# Patient Record
Sex: Female | Born: 1975 | Race: White | Hispanic: No | Smoking: Current every day smoker
Health system: Southern US, Community
[De-identification: ages and names within clinical notes are randomized; demographics above are authoritative.]

## PROBLEM LIST (undated history)

## (undated) ENCOUNTER — Emergency Department (HOSPITAL_COMMUNITY): Discharge status: Against Medical Advice | Payer: Medicare Other

## (undated) DIAGNOSIS — Z765 Malingerer [conscious simulation]: Secondary | ICD-10-CM

## (undated) DIAGNOSIS — S92909A Unspecified fracture of unspecified foot, initial encounter for closed fracture: Secondary | ICD-10-CM

## (undated) DIAGNOSIS — D219 Benign neoplasm of connective and other soft tissue, unspecified: Secondary | ICD-10-CM

## (undated) DIAGNOSIS — C801 Malignant (primary) neoplasm, unspecified: Secondary | ICD-10-CM

## (undated) DIAGNOSIS — F1910 Other psychoactive substance abuse, uncomplicated: Secondary | ICD-10-CM

## (undated) DIAGNOSIS — N809 Endometriosis, unspecified: Secondary | ICD-10-CM

## (undated) DIAGNOSIS — G4730 Sleep apnea, unspecified: Secondary | ICD-10-CM

## (undated) DIAGNOSIS — E079 Disorder of thyroid, unspecified: Secondary | ICD-10-CM

## (undated) DIAGNOSIS — R569 Unspecified convulsions: Secondary | ICD-10-CM

## (undated) DIAGNOSIS — R51 Headache: Secondary | ICD-10-CM

## (undated) DIAGNOSIS — F419 Anxiety disorder, unspecified: Secondary | ICD-10-CM

## (undated) DIAGNOSIS — K219 Gastro-esophageal reflux disease without esophagitis: Secondary | ICD-10-CM

## (undated) DIAGNOSIS — M797 Fibromyalgia: Secondary | ICD-10-CM

## (undated) DIAGNOSIS — F1010 Alcohol abuse, uncomplicated: Secondary | ICD-10-CM

## (undated) DIAGNOSIS — E039 Hypothyroidism, unspecified: Secondary | ICD-10-CM

## (undated) DIAGNOSIS — M1990 Unspecified osteoarthritis, unspecified site: Secondary | ICD-10-CM

## (undated) DIAGNOSIS — D759 Disease of blood and blood-forming organs, unspecified: Secondary | ICD-10-CM

## (undated) DIAGNOSIS — T4145XA Adverse effect of unspecified anesthetic, initial encounter: Secondary | ICD-10-CM

## (undated) DIAGNOSIS — T8859XA Other complications of anesthesia, initial encounter: Secondary | ICD-10-CM

## (undated) DIAGNOSIS — D649 Anemia, unspecified: Secondary | ICD-10-CM

## (undated) DIAGNOSIS — R112 Nausea with vomiting, unspecified: Secondary | ICD-10-CM

## (undated) DIAGNOSIS — M5126 Other intervertebral disc displacement, lumbar region: Secondary | ICD-10-CM

## (undated) DIAGNOSIS — M549 Dorsalgia, unspecified: Secondary | ICD-10-CM

## (undated) DIAGNOSIS — Z9889 Other specified postprocedural states: Secondary | ICD-10-CM

## (undated) DIAGNOSIS — G709 Myoneural disorder, unspecified: Secondary | ICD-10-CM

## (undated) DIAGNOSIS — G8929 Other chronic pain: Secondary | ICD-10-CM

## (undated) DIAGNOSIS — E282 Polycystic ovarian syndrome: Secondary | ICD-10-CM

## (undated) HISTORY — PX: TONSILLECTOMY: SUR1361

## (undated) HISTORY — PX: TUBAL LIGATION: SHX77

## (undated) HISTORY — PX: ADENOIDECTOMY: SUR15

## (undated) HISTORY — PX: BACK SURGERY: SHX140

---

## 1998-08-30 ENCOUNTER — Inpatient Hospital Stay (HOSPITAL_COMMUNITY): Admission: AD | Admit: 1998-08-30 | Discharge: 1998-08-31 | Payer: Self-pay | Admitting: Family Medicine

## 1999-07-08 ENCOUNTER — Encounter: Payer: Self-pay | Admitting: Family Medicine

## 1999-07-08 ENCOUNTER — Encounter: Admission: RE | Admit: 1999-07-08 | Discharge: 1999-07-08 | Payer: Self-pay | Admitting: Family Medicine

## 2001-02-02 ENCOUNTER — Emergency Department (HOSPITAL_COMMUNITY): Admission: EM | Admit: 2001-02-02 | Discharge: 2001-02-02 | Payer: Self-pay | Admitting: Emergency Medicine

## 2001-12-05 ENCOUNTER — Encounter: Payer: Self-pay | Admitting: Specialist

## 2001-12-05 ENCOUNTER — Observation Stay (HOSPITAL_COMMUNITY): Admission: RE | Admit: 2001-12-05 | Discharge: 2001-12-06 | Payer: Self-pay | Admitting: Specialist

## 2002-01-19 ENCOUNTER — Encounter: Payer: Self-pay | Admitting: Specialist

## 2002-01-19 ENCOUNTER — Inpatient Hospital Stay (HOSPITAL_COMMUNITY): Admission: RE | Admit: 2002-01-19 | Discharge: 2002-01-21 | Payer: Self-pay | Admitting: Specialist

## 2002-04-10 ENCOUNTER — Encounter: Payer: Self-pay | Admitting: Specialist

## 2002-04-10 ENCOUNTER — Encounter: Admission: RE | Admit: 2002-04-10 | Discharge: 2002-04-10 | Payer: Self-pay | Admitting: Specialist

## 2002-04-25 ENCOUNTER — Encounter: Admission: RE | Admit: 2002-04-25 | Discharge: 2002-04-25 | Payer: Self-pay | Admitting: Specialist

## 2002-04-25 ENCOUNTER — Encounter: Payer: Self-pay | Admitting: Specialist

## 2002-07-20 ENCOUNTER — Emergency Department (HOSPITAL_COMMUNITY): Admission: EM | Admit: 2002-07-20 | Discharge: 2002-07-20 | Payer: Self-pay | Admitting: *Deleted

## 2003-09-24 ENCOUNTER — Ambulatory Visit (HOSPITAL_BASED_OUTPATIENT_CLINIC_OR_DEPARTMENT_OTHER): Admission: RE | Admit: 2003-09-24 | Discharge: 2003-09-24 | Payer: Self-pay | Admitting: Otolaryngology

## 2003-09-24 ENCOUNTER — Encounter (INDEPENDENT_AMBULATORY_CARE_PROVIDER_SITE_OTHER): Payer: Self-pay | Admitting: *Deleted

## 2003-09-24 ENCOUNTER — Ambulatory Visit (HOSPITAL_COMMUNITY): Admission: RE | Admit: 2003-09-24 | Discharge: 2003-09-24 | Payer: Self-pay | Admitting: Otolaryngology

## 2003-10-02 ENCOUNTER — Observation Stay (HOSPITAL_COMMUNITY): Admission: EM | Admit: 2003-10-02 | Discharge: 2003-10-02 | Payer: Self-pay | Admitting: Emergency Medicine

## 2003-10-04 ENCOUNTER — Emergency Department (HOSPITAL_COMMUNITY): Admission: EM | Admit: 2003-10-04 | Discharge: 2003-10-04 | Payer: Self-pay | Admitting: Emergency Medicine

## 2003-10-05 ENCOUNTER — Ambulatory Visit (HOSPITAL_COMMUNITY): Admission: RE | Admit: 2003-10-05 | Discharge: 2003-10-05 | Payer: Self-pay | Admitting: Otolaryngology

## 2004-01-01 ENCOUNTER — Emergency Department (HOSPITAL_COMMUNITY): Admission: EM | Admit: 2004-01-01 | Discharge: 2004-01-01 | Payer: Self-pay | Admitting: Emergency Medicine

## 2004-01-24 ENCOUNTER — Inpatient Hospital Stay (HOSPITAL_COMMUNITY): Admission: RE | Admit: 2004-01-24 | Discharge: 2004-01-25 | Payer: Self-pay | Admitting: Specialist

## 2004-01-27 ENCOUNTER — Emergency Department (HOSPITAL_COMMUNITY): Admission: EM | Admit: 2004-01-27 | Discharge: 2004-01-27 | Payer: Self-pay | Admitting: Emergency Medicine

## 2005-03-17 ENCOUNTER — Other Ambulatory Visit: Admission: RE | Admit: 2005-03-17 | Discharge: 2005-03-17 | Payer: Self-pay | Admitting: Obstetrics and Gynecology

## 2005-03-20 ENCOUNTER — Ambulatory Visit (HOSPITAL_COMMUNITY): Admission: RE | Admit: 2005-03-20 | Discharge: 2005-03-20 | Payer: Self-pay | Admitting: Obstetrics and Gynecology

## 2005-04-25 ENCOUNTER — Inpatient Hospital Stay (HOSPITAL_COMMUNITY): Admission: AD | Admit: 2005-04-25 | Discharge: 2005-04-25 | Payer: Self-pay | Admitting: Gynecology

## 2005-05-27 ENCOUNTER — Inpatient Hospital Stay (HOSPITAL_COMMUNITY): Admission: RE | Admit: 2005-05-27 | Discharge: 2005-05-30 | Payer: Self-pay | Admitting: Orthopaedic Surgery

## 2005-11-24 ENCOUNTER — Ambulatory Visit (HOSPITAL_BASED_OUTPATIENT_CLINIC_OR_DEPARTMENT_OTHER): Admission: RE | Admit: 2005-11-24 | Discharge: 2005-11-24 | Payer: Self-pay | Admitting: Orthopaedic Surgery

## 2005-12-25 ENCOUNTER — Encounter: Admission: RE | Admit: 2005-12-25 | Discharge: 2005-12-25 | Payer: Self-pay | Admitting: Orthopaedic Surgery

## 2005-12-31 ENCOUNTER — Ambulatory Visit (HOSPITAL_COMMUNITY): Admission: RE | Admit: 2005-12-31 | Discharge: 2005-12-31 | Payer: Self-pay | Admitting: Orthopaedic Surgery

## 2006-01-28 ENCOUNTER — Encounter: Admission: RE | Admit: 2006-01-28 | Discharge: 2006-01-28 | Payer: Self-pay | Admitting: Orthopaedic Surgery

## 2006-04-02 ENCOUNTER — Other Ambulatory Visit: Admission: RE | Admit: 2006-04-02 | Discharge: 2006-04-02 | Payer: Self-pay | Admitting: Obstetrics and Gynecology

## 2006-04-15 ENCOUNTER — Encounter: Admission: RE | Admit: 2006-04-15 | Discharge: 2006-04-15 | Payer: Self-pay | Admitting: Internal Medicine

## 2006-04-22 ENCOUNTER — Encounter: Admission: RE | Admit: 2006-04-22 | Discharge: 2006-04-22 | Payer: Self-pay | Admitting: Internal Medicine

## 2006-12-17 ENCOUNTER — Emergency Department (HOSPITAL_COMMUNITY): Admission: EM | Admit: 2006-12-17 | Discharge: 2006-12-17 | Payer: Self-pay | Admitting: *Deleted

## 2007-08-17 ENCOUNTER — Inpatient Hospital Stay (HOSPITAL_COMMUNITY): Admission: AD | Admit: 2007-08-17 | Discharge: 2007-08-17 | Payer: Self-pay | Admitting: Obstetrics & Gynecology

## 2007-09-12 ENCOUNTER — Inpatient Hospital Stay (HOSPITAL_COMMUNITY): Admission: RE | Admit: 2007-09-12 | Discharge: 2007-09-15 | Payer: Self-pay | Admitting: Obstetrics and Gynecology

## 2007-09-24 ENCOUNTER — Inpatient Hospital Stay (HOSPITAL_COMMUNITY): Admission: AD | Admit: 2007-09-24 | Discharge: 2007-09-24 | Payer: Self-pay | Admitting: Obstetrics and Gynecology

## 2008-02-10 ENCOUNTER — Emergency Department (HOSPITAL_COMMUNITY): Admission: EM | Admit: 2008-02-10 | Discharge: 2008-02-10 | Payer: Self-pay | Admitting: Emergency Medicine

## 2008-06-06 ENCOUNTER — Emergency Department (HOSPITAL_COMMUNITY): Admission: EM | Admit: 2008-06-06 | Discharge: 2008-06-06 | Payer: Self-pay | Admitting: Emergency Medicine

## 2008-08-16 ENCOUNTER — Emergency Department (HOSPITAL_COMMUNITY): Admission: EM | Admit: 2008-08-16 | Discharge: 2008-08-17 | Payer: Self-pay | Admitting: Emergency Medicine

## 2008-09-16 ENCOUNTER — Emergency Department (HOSPITAL_COMMUNITY): Admission: EM | Admit: 2008-09-16 | Discharge: 2008-09-16 | Payer: Self-pay | Admitting: Emergency Medicine

## 2008-10-10 ENCOUNTER — Ambulatory Visit (HOSPITAL_COMMUNITY): Admission: RE | Admit: 2008-10-10 | Discharge: 2008-10-10 | Payer: Self-pay | Admitting: Internal Medicine

## 2009-02-05 ENCOUNTER — Emergency Department (HOSPITAL_COMMUNITY): Admission: EM | Admit: 2009-02-05 | Discharge: 2009-02-05 | Payer: Self-pay | Admitting: Emergency Medicine

## 2009-02-07 ENCOUNTER — Emergency Department (HOSPITAL_COMMUNITY): Admission: EM | Admit: 2009-02-07 | Discharge: 2009-02-07 | Payer: Self-pay | Admitting: Emergency Medicine

## 2009-04-05 ENCOUNTER — Emergency Department (HOSPITAL_COMMUNITY): Admission: EM | Admit: 2009-04-05 | Discharge: 2009-04-05 | Payer: Self-pay | Admitting: Emergency Medicine

## 2009-04-11 ENCOUNTER — Emergency Department (HOSPITAL_COMMUNITY): Admission: EM | Admit: 2009-04-11 | Discharge: 2009-04-11 | Payer: Self-pay | Admitting: Emergency Medicine

## 2009-05-27 ENCOUNTER — Ambulatory Visit (HOSPITAL_COMMUNITY): Admission: RE | Admit: 2009-05-27 | Discharge: 2009-05-27 | Payer: Self-pay | Admitting: Obstetrics and Gynecology

## 2009-08-09 ENCOUNTER — Emergency Department (HOSPITAL_COMMUNITY): Admission: EM | Admit: 2009-08-09 | Discharge: 2009-08-09 | Payer: Self-pay | Admitting: Emergency Medicine

## 2009-08-19 ENCOUNTER — Emergency Department (HOSPITAL_COMMUNITY): Admission: EM | Admit: 2009-08-19 | Discharge: 2009-08-19 | Payer: Self-pay | Admitting: Emergency Medicine

## 2009-08-31 ENCOUNTER — Emergency Department (HOSPITAL_COMMUNITY): Admission: EM | Admit: 2009-08-31 | Discharge: 2009-08-31 | Payer: Self-pay | Admitting: Emergency Medicine

## 2009-10-01 ENCOUNTER — Emergency Department (HOSPITAL_COMMUNITY): Admission: EM | Admit: 2009-10-01 | Discharge: 2009-10-01 | Payer: Self-pay | Admitting: Emergency Medicine

## 2009-10-06 ENCOUNTER — Emergency Department (HOSPITAL_COMMUNITY): Admission: EM | Admit: 2009-10-06 | Discharge: 2009-10-06 | Payer: Self-pay | Admitting: Emergency Medicine

## 2009-10-26 ENCOUNTER — Emergency Department (HOSPITAL_COMMUNITY): Admission: EM | Admit: 2009-10-26 | Discharge: 2009-10-26 | Payer: Self-pay | Admitting: Emergency Medicine

## 2009-11-23 ENCOUNTER — Emergency Department (HOSPITAL_COMMUNITY): Admission: EM | Admit: 2009-11-23 | Discharge: 2009-11-23 | Payer: Self-pay | Admitting: Emergency Medicine

## 2009-11-25 ENCOUNTER — Encounter: Admission: RE | Admit: 2009-11-25 | Discharge: 2009-11-25 | Payer: Self-pay | Admitting: Internal Medicine

## 2009-11-28 ENCOUNTER — Emergency Department (HOSPITAL_COMMUNITY): Admission: EM | Admit: 2009-11-28 | Discharge: 2009-11-28 | Payer: Self-pay | Admitting: Emergency Medicine

## 2009-12-26 ENCOUNTER — Emergency Department (HOSPITAL_COMMUNITY): Admission: EM | Admit: 2009-12-26 | Discharge: 2009-12-27 | Payer: Self-pay | Admitting: Emergency Medicine

## 2010-02-19 ENCOUNTER — Ambulatory Visit: Payer: Self-pay | Admitting: Psychiatry

## 2010-02-19 ENCOUNTER — Emergency Department (HOSPITAL_COMMUNITY): Admission: EM | Admit: 2010-02-19 | Discharge: 2010-02-19 | Payer: Self-pay | Admitting: Ophthalmology

## 2010-08-12 ENCOUNTER — Emergency Department (HOSPITAL_COMMUNITY)
Admission: EM | Admit: 2010-08-12 | Discharge: 2010-08-12 | Payer: BC Managed Care – PPO | Source: Home / Self Care | Admitting: Emergency Medicine

## 2010-08-17 ENCOUNTER — Encounter: Payer: Self-pay | Admitting: Obstetrics and Gynecology

## 2010-09-10 ENCOUNTER — Emergency Department (HOSPITAL_COMMUNITY): Payer: BC Managed Care – PPO

## 2010-09-10 ENCOUNTER — Emergency Department (HOSPITAL_COMMUNITY)
Admission: EM | Admit: 2010-09-10 | Discharge: 2010-09-10 | Disposition: A | Discharge status: Home / Self Care | Payer: BC Managed Care – PPO | Attending: Emergency Medicine | Admitting: Emergency Medicine

## 2010-09-10 DIAGNOSIS — G8929 Other chronic pain: Secondary | ICD-10-CM | POA: Insufficient documentation

## 2010-09-10 DIAGNOSIS — Z79899 Other long term (current) drug therapy: Secondary | ICD-10-CM | POA: Insufficient documentation

## 2010-09-10 DIAGNOSIS — M533 Sacrococcygeal disorders, not elsewhere classified: Secondary | ICD-10-CM | POA: Insufficient documentation

## 2010-09-10 DIAGNOSIS — M79609 Pain in unspecified limb: Secondary | ICD-10-CM | POA: Insufficient documentation

## 2010-09-10 DIAGNOSIS — S300XXA Contusion of lower back and pelvis, initial encounter: Secondary | ICD-10-CM | POA: Insufficient documentation

## 2010-09-10 DIAGNOSIS — W230XXA Caught, crushed, jammed, or pinched between moving objects, initial encounter: Secondary | ICD-10-CM | POA: Insufficient documentation

## 2010-09-10 DIAGNOSIS — E039 Hypothyroidism, unspecified: Secondary | ICD-10-CM | POA: Insufficient documentation

## 2010-09-10 DIAGNOSIS — M1990 Unspecified osteoarthritis, unspecified site: Secondary | ICD-10-CM | POA: Insufficient documentation

## 2010-10-11 LAB — RAPID URINE DRUG SCREEN, HOSP PERFORMED
Amphetamines: NOT DETECTED
Barbiturates: NOT DETECTED
Benzodiazepines: POSITIVE — AB
Cocaine: NOT DETECTED
Opiates: NOT DETECTED

## 2010-10-11 LAB — BASIC METABOLIC PANEL
BUN: 16 mg/dL (ref 6–23)
CO2: 24 mEq/L (ref 19–32)
Chloride: 105 mEq/L (ref 96–112)
Creatinine, Ser: 1.36 mg/dL — ABNORMAL HIGH (ref 0.4–1.2)
GFR calc Af Amer: 54 mL/min — ABNORMAL LOW (ref >60)
Potassium: 3.8 mEq/L (ref 3.5–5.1)

## 2010-10-11 LAB — URINALYSIS, ROUTINE W REFLEX MICROSCOPIC
Glucose, UA: NEGATIVE mg/dL
Hgb urine dipstick: NEGATIVE
Ketones, ur: NEGATIVE mg/dL
Protein, ur: NEGATIVE mg/dL

## 2010-10-11 LAB — CBC
HCT: 43.5 % (ref 36.0–46.0)
MCH: 32.7 pg (ref 26.0–34.0)
MCV: 92.4 fL (ref 78.0–100.0)
Platelets: 286 10*3/uL (ref 150–400)
RBC: 4.71 MIL/uL (ref 3.87–5.11)

## 2010-10-11 LAB — DIFFERENTIAL
Eosinophils Absolute: 0.1 10*3/uL (ref 0.0–0.7)
Eosinophils Relative: 1 % (ref 0–5)
Lymphs Abs: 3.2 10*3/uL (ref 0.7–4.0)
Monocytes Absolute: 1 10*3/uL (ref 0.1–1.0)

## 2010-10-11 LAB — URINE MICROSCOPIC-ADD ON

## 2010-10-11 LAB — URINE CULTURE

## 2010-10-13 LAB — POCT I-STAT, CHEM 8
BUN: 9 mg/dL (ref 6–23)
Calcium, Ion: 1.07 mmol/L — ABNORMAL LOW (ref 1.12–1.32)
Chloride: 107 mEq/L (ref 96–112)
Creatinine, Ser: 0.8 mg/dL (ref 0.4–1.2)
Glucose, Bld: 106 mg/dL — ABNORMAL HIGH (ref 70–99)
TCO2: 21 mmol/L (ref 0–100)

## 2010-10-13 LAB — URINE MICROSCOPIC-ADD ON

## 2010-10-13 LAB — URINALYSIS, ROUTINE W REFLEX MICROSCOPIC
Bilirubin Urine: NEGATIVE
Ketones, ur: NEGATIVE mg/dL
Leukocytes, UA: NEGATIVE
Nitrite: NEGATIVE
Protein, ur: NEGATIVE mg/dL
pH: 6 (ref 5.0–8.0)

## 2010-10-13 LAB — BASIC METABOLIC PANEL
BUN: 8 mg/dL (ref 6–23)
CO2: 25 mEq/L (ref 19–32)
Calcium: 9.2 mg/dL (ref 8.4–10.5)
Chloride: 107 mEq/L (ref 96–112)
Creatinine, Ser: 0.92 mg/dL (ref 0.4–1.2)
GFR calc Af Amer: >60 mL/min (ref >60)
Glucose, Bld: 106 mg/dL — ABNORMAL HIGH (ref 70–99)

## 2010-10-13 LAB — GLUCOSE, CAPILLARY: Glucose-Capillary: 127 mg/dL — ABNORMAL HIGH (ref 70–99)

## 2010-10-13 LAB — PREGNANCY, URINE: Preg Test, Ur: NEGATIVE

## 2010-10-13 LAB — CBC
MCHC: 34.6 g/dL (ref 30.0–36.0)
MCV: 93.2 fL (ref 78.0–100.0)
RDW: 14.1 % (ref 11.5–15.5)

## 2010-10-14 ENCOUNTER — Emergency Department (HOSPITAL_COMMUNITY): Payer: BC Managed Care – PPO

## 2010-10-14 ENCOUNTER — Emergency Department (HOSPITAL_COMMUNITY)
Admission: EM | Admit: 2010-10-14 | Discharge: 2010-10-14 | Disposition: A | Discharge status: Home / Self Care | Payer: BC Managed Care – PPO | Attending: Emergency Medicine | Admitting: Emergency Medicine

## 2010-10-14 DIAGNOSIS — IMO0001 Reserved for inherently not codable concepts without codable children: Secondary | ICD-10-CM | POA: Insufficient documentation

## 2010-10-14 DIAGNOSIS — M79609 Pain in unspecified limb: Secondary | ICD-10-CM | POA: Insufficient documentation

## 2010-10-14 DIAGNOSIS — M545 Low back pain, unspecified: Secondary | ICD-10-CM | POA: Insufficient documentation

## 2010-10-14 DIAGNOSIS — Z79899 Other long term (current) drug therapy: Secondary | ICD-10-CM | POA: Insufficient documentation

## 2010-10-14 DIAGNOSIS — M1990 Unspecified osteoarthritis, unspecified site: Secondary | ICD-10-CM | POA: Insufficient documentation

## 2010-10-14 DIAGNOSIS — G8929 Other chronic pain: Secondary | ICD-10-CM | POA: Insufficient documentation

## 2010-10-14 LAB — POCT I-STAT, CHEM 8
BUN: 7 mg/dL (ref 6–23)
Calcium, Ion: 1.14 mmol/L (ref 1.12–1.32)
Chloride: 109 mEq/L (ref 96–112)
Glucose, Bld: 86 mg/dL (ref 70–99)
HCT: 45 % (ref 36.0–46.0)
Potassium: 4 mEq/L (ref 3.5–5.1)

## 2010-10-14 LAB — GLUCOSE, CAPILLARY: Glucose-Capillary: 78 mg/dL (ref 70–99)

## 2010-10-15 LAB — URINE MICROSCOPIC-ADD ON

## 2010-10-15 LAB — COMPREHENSIVE METABOLIC PANEL
ALT: 40 U/L — ABNORMAL HIGH (ref 0–35)
Alkaline Phosphatase: 51 U/L (ref 39–117)
BUN: 8 mg/dL (ref 6–23)
CO2: 22 mEq/L (ref 19–32)
GFR calc non Af Amer: >60 mL/min (ref >60)
Glucose, Bld: 129 mg/dL — ABNORMAL HIGH (ref 70–99)
Potassium: 3.9 mEq/L (ref 3.5–5.1)
Sodium: 138 mEq/L (ref 135–145)
Total Bilirubin: 0.3 mg/dL (ref 0.3–1.2)

## 2010-10-15 LAB — URINALYSIS, ROUTINE W REFLEX MICROSCOPIC
Specific Gravity, Urine: 1.029 (ref 1.005–1.030)
Urobilinogen, UA: 0.2 mg/dL (ref 0.0–1.0)

## 2010-10-15 LAB — DIFFERENTIAL
Basophils Absolute: 0 10*3/uL (ref 0.0–0.1)
Eosinophils Relative: 1 % (ref 0–5)
Lymphocytes Relative: 4 % — ABNORMAL LOW (ref 12–46)
Neutro Abs: 7.9 10*3/uL — ABNORMAL HIGH (ref 1.7–7.7)
Neutrophils Relative %: 93 % — ABNORMAL HIGH (ref 43–77)

## 2010-10-15 LAB — CBC
HCT: 44.8 % (ref 36.0–46.0)
Hemoglobin: 15.3 g/dL — ABNORMAL HIGH (ref 12.0–15.0)
RBC: 4.8 MIL/uL (ref 3.87–5.11)
RDW: 13.2 % (ref 11.5–15.5)

## 2010-10-15 LAB — POCT CARDIAC MARKERS: CKMB, poc: <1 ng/mL — ABNORMAL LOW (ref 1.0–8.0)

## 2010-10-15 LAB — LIPASE, BLOOD: Lipase: 19 U/L (ref 11–59)

## 2010-10-16 ENCOUNTER — Inpatient Hospital Stay (HOSPITAL_COMMUNITY): Admission: RE | Admit: 2010-10-16 | Payer: BC Managed Care – PPO | Source: Ambulatory Visit

## 2010-10-16 ENCOUNTER — Emergency Department (HOSPITAL_COMMUNITY)
Admission: EM | Admit: 2010-10-16 | Discharge: 2010-10-17 | Disposition: A | Discharge status: Home / Self Care | Payer: BC Managed Care – PPO | Attending: Emergency Medicine | Admitting: Emergency Medicine

## 2010-10-16 DIAGNOSIS — M5137 Other intervertebral disc degeneration, lumbosacral region: Secondary | ICD-10-CM | POA: Insufficient documentation

## 2010-10-16 DIAGNOSIS — Z79899 Other long term (current) drug therapy: Secondary | ICD-10-CM | POA: Insufficient documentation

## 2010-10-16 DIAGNOSIS — E039 Hypothyroidism, unspecified: Secondary | ICD-10-CM | POA: Insufficient documentation

## 2010-10-16 DIAGNOSIS — M51379 Other intervertebral disc degeneration, lumbosacral region without mention of lumbar back pain or lower extremity pain: Secondary | ICD-10-CM | POA: Insufficient documentation

## 2010-10-16 DIAGNOSIS — M545 Low back pain, unspecified: Secondary | ICD-10-CM | POA: Insufficient documentation

## 2010-10-16 DIAGNOSIS — R000 Tachycardia, unspecified: Secondary | ICD-10-CM | POA: Insufficient documentation

## 2010-10-16 DIAGNOSIS — M1990 Unspecified osteoarthritis, unspecified site: Secondary | ICD-10-CM | POA: Insufficient documentation

## 2010-10-16 DIAGNOSIS — G8929 Other chronic pain: Secondary | ICD-10-CM | POA: Insufficient documentation

## 2010-10-16 DIAGNOSIS — R209 Unspecified disturbances of skin sensation: Secondary | ICD-10-CM | POA: Insufficient documentation

## 2010-10-17 LAB — POCT PREGNANCY, URINE: Preg Test, Ur: NEGATIVE

## 2010-10-21 ENCOUNTER — Emergency Department (HOSPITAL_COMMUNITY)
Admission: EM | Admit: 2010-10-21 | Discharge: 2010-10-21 | Disposition: A | Discharge status: Home / Self Care | Payer: BC Managed Care – PPO | Attending: Emergency Medicine | Admitting: Emergency Medicine

## 2010-10-21 DIAGNOSIS — R209 Unspecified disturbances of skin sensation: Secondary | ICD-10-CM | POA: Insufficient documentation

## 2010-10-21 DIAGNOSIS — G8929 Other chronic pain: Secondary | ICD-10-CM | POA: Insufficient documentation

## 2010-10-21 DIAGNOSIS — M79609 Pain in unspecified limb: Secondary | ICD-10-CM | POA: Insufficient documentation

## 2010-10-21 DIAGNOSIS — M545 Low back pain, unspecified: Secondary | ICD-10-CM | POA: Insufficient documentation

## 2010-10-21 DIAGNOSIS — X503XXA Overexertion from repetitive movements, initial encounter: Secondary | ICD-10-CM | POA: Insufficient documentation

## 2010-10-21 DIAGNOSIS — E039 Hypothyroidism, unspecified: Secondary | ICD-10-CM | POA: Insufficient documentation

## 2010-10-21 DIAGNOSIS — IMO0001 Reserved for inherently not codable concepts without codable children: Secondary | ICD-10-CM | POA: Insufficient documentation

## 2010-10-21 DIAGNOSIS — IMO0002 Reserved for concepts with insufficient information to code with codable children: Secondary | ICD-10-CM | POA: Insufficient documentation

## 2010-10-21 DIAGNOSIS — M1990 Unspecified osteoarthritis, unspecified site: Secondary | ICD-10-CM | POA: Insufficient documentation

## 2010-10-21 DIAGNOSIS — Y92009 Unspecified place in unspecified non-institutional (private) residence as the place of occurrence of the external cause: Secondary | ICD-10-CM | POA: Insufficient documentation

## 2010-10-21 DIAGNOSIS — Z79899 Other long term (current) drug therapy: Secondary | ICD-10-CM | POA: Insufficient documentation

## 2010-10-29 LAB — CBC
HCT: 41.5 % (ref 36.0–46.0)
Hemoglobin: 14.2 g/dL (ref 12.0–15.0)
MCV: 96 fL (ref 78.0–100.0)
WBC: 8.4 10*3/uL (ref 4.0–10.5)

## 2010-10-30 ENCOUNTER — Other Ambulatory Visit: Payer: Self-pay | Admitting: Orthopaedic Surgery

## 2010-10-30 DIAGNOSIS — IMO0002 Reserved for concepts with insufficient information to code with codable children: Secondary | ICD-10-CM

## 2010-10-31 LAB — BASIC METABOLIC PANEL
BUN: 8 mg/dL (ref 6–23)
CO2: 27 mEq/L (ref 19–32)
Chloride: 106 mEq/L (ref 96–112)
Glucose, Bld: 93 mg/dL (ref 70–99)
Potassium: 4.1 mEq/L (ref 3.5–5.1)
Sodium: 139 mEq/L (ref 135–145)

## 2010-11-03 ENCOUNTER — Other Ambulatory Visit: Payer: BC Managed Care – PPO

## 2010-11-07 ENCOUNTER — Ambulatory Visit
Admission: RE | Admit: 2010-11-07 | Discharge: 2010-11-07 | Disposition: A | Discharge status: Home / Self Care | Payer: BC Managed Care – PPO | Source: Ambulatory Visit | Attending: Orthopaedic Surgery | Admitting: Orthopaedic Surgery

## 2010-11-07 DIAGNOSIS — IMO0002 Reserved for concepts with insufficient information to code with codable children: Secondary | ICD-10-CM

## 2010-11-11 LAB — POCT URINALYSIS DIP (DEVICE)
Hgb urine dipstick: NEGATIVE
Ketones, ur: NEGATIVE mg/dL
Protein, ur: 30 mg/dL — AB
Specific Gravity, Urine: 1.02 (ref 1.005–1.030)
Urobilinogen, UA: 0.2 mg/dL (ref 0.0–1.0)
pH: 7 (ref 5.0–8.0)

## 2010-11-21 ENCOUNTER — Emergency Department (HOSPITAL_COMMUNITY)
Admission: EM | Admit: 2010-11-21 | Discharge: 2010-11-21 | Disposition: A | Discharge status: Home / Self Care | Payer: BC Managed Care – PPO | Attending: Emergency Medicine | Admitting: Emergency Medicine

## 2010-11-21 ENCOUNTER — Emergency Department (HOSPITAL_COMMUNITY): Payer: BC Managed Care – PPO

## 2010-11-21 DIAGNOSIS — IMO0001 Reserved for inherently not codable concepts without codable children: Secondary | ICD-10-CM | POA: Insufficient documentation

## 2010-11-21 DIAGNOSIS — W19XXXA Unspecified fall, initial encounter: Secondary | ICD-10-CM | POA: Insufficient documentation

## 2010-11-21 DIAGNOSIS — M545 Low back pain, unspecified: Secondary | ICD-10-CM | POA: Insufficient documentation

## 2010-11-21 DIAGNOSIS — M79609 Pain in unspecified limb: Secondary | ICD-10-CM | POA: Insufficient documentation

## 2010-11-21 DIAGNOSIS — Z79899 Other long term (current) drug therapy: Secondary | ICD-10-CM | POA: Insufficient documentation

## 2010-11-21 DIAGNOSIS — G8929 Other chronic pain: Secondary | ICD-10-CM | POA: Insufficient documentation

## 2010-11-21 DIAGNOSIS — E039 Hypothyroidism, unspecified: Secondary | ICD-10-CM | POA: Insufficient documentation

## 2010-11-21 DIAGNOSIS — M1990 Unspecified osteoarthritis, unspecified site: Secondary | ICD-10-CM | POA: Insufficient documentation

## 2010-12-01 ENCOUNTER — Emergency Department (HOSPITAL_COMMUNITY)
Admission: EM | Admit: 2010-12-01 | Discharge: 2010-12-02 | Discharge status: Against Medical Advice | Payer: BC Managed Care – PPO | Attending: Emergency Medicine | Admitting: Emergency Medicine

## 2010-12-01 ENCOUNTER — Emergency Department (HOSPITAL_COMMUNITY): Payer: BC Managed Care – PPO

## 2010-12-01 DIAGNOSIS — X58XXXA Exposure to other specified factors, initial encounter: Secondary | ICD-10-CM | POA: Insufficient documentation

## 2010-12-01 DIAGNOSIS — R509 Fever, unspecified: Secondary | ICD-10-CM | POA: Insufficient documentation

## 2010-12-01 DIAGNOSIS — R300 Dysuria: Secondary | ICD-10-CM | POA: Insufficient documentation

## 2010-12-01 DIAGNOSIS — R110 Nausea: Secondary | ICD-10-CM | POA: Insufficient documentation

## 2010-12-01 DIAGNOSIS — S301XXA Contusion of abdominal wall, initial encounter: Secondary | ICD-10-CM | POA: Insufficient documentation

## 2010-12-01 DIAGNOSIS — M549 Dorsalgia, unspecified: Secondary | ICD-10-CM | POA: Insufficient documentation

## 2010-12-01 DIAGNOSIS — R109 Unspecified abdominal pain: Secondary | ICD-10-CM | POA: Insufficient documentation

## 2010-12-01 LAB — CBC
MCH: 30.4 pg (ref 26.0–34.0)
MCHC: 32.5 g/dL (ref 30.0–36.0)
MCV: 93.6 fL (ref 78.0–100.0)
Platelets: 205 10*3/uL (ref 150–400)
RBC: 4.21 MIL/uL (ref 3.87–5.11)

## 2010-12-01 LAB — DIFFERENTIAL
Eosinophils Absolute: 0.2 10*3/uL (ref 0.0–0.7)
Eosinophils Relative: 3 % (ref 0–5)
Lymphs Abs: 2.1 10*3/uL (ref 0.7–4.0)
Monocytes Absolute: 0.7 10*3/uL (ref 0.1–1.0)
Monocytes Relative: 8 % (ref 3–12)
Neutrophils Relative %: 63 % (ref 43–77)

## 2010-12-01 LAB — URINALYSIS, ROUTINE W REFLEX MICROSCOPIC
Bilirubin Urine: NEGATIVE
Hgb urine dipstick: NEGATIVE
Protein, ur: 30 mg/dL — AB
Urobilinogen, UA: 0.2 mg/dL (ref 0.0–1.0)

## 2010-12-01 LAB — COMPREHENSIVE METABOLIC PANEL
Albumin: 3.9 g/dL (ref 3.5–5.2)
BUN: 10 mg/dL (ref 6–23)
Calcium: 9.8 mg/dL (ref 8.4–10.5)
Chloride: 100 mEq/L (ref 96–112)
Creatinine, Ser: 1.01 mg/dL (ref 0.4–1.2)
GFR calc non Af Amer: >60 mL/min (ref >60)
Total Bilirubin: 0.1 mg/dL — ABNORMAL LOW (ref 0.3–1.2)

## 2010-12-01 LAB — APTT: aPTT: 30 seconds (ref 24–37)

## 2010-12-01 LAB — PROTIME-INR: INR: 0.94 (ref 0.00–1.49)

## 2010-12-01 LAB — URINE MICROSCOPIC-ADD ON

## 2010-12-05 ENCOUNTER — Emergency Department (HOSPITAL_COMMUNITY)
Admission: EM | Admit: 2010-12-05 | Discharge: 2010-12-05 | Disposition: A | Discharge status: Home / Self Care | Payer: BC Managed Care – PPO | Attending: Emergency Medicine | Admitting: Emergency Medicine

## 2010-12-05 ENCOUNTER — Emergency Department (HOSPITAL_COMMUNITY): Payer: BC Managed Care – PPO

## 2010-12-05 DIAGNOSIS — E039 Hypothyroidism, unspecified: Secondary | ICD-10-CM | POA: Insufficient documentation

## 2010-12-05 DIAGNOSIS — R0989 Other specified symptoms and signs involving the circulatory and respiratory systems: Secondary | ICD-10-CM | POA: Insufficient documentation

## 2010-12-05 DIAGNOSIS — R000 Tachycardia, unspecified: Secondary | ICD-10-CM | POA: Insufficient documentation

## 2010-12-05 DIAGNOSIS — J069 Acute upper respiratory infection, unspecified: Secondary | ICD-10-CM | POA: Insufficient documentation

## 2010-12-05 DIAGNOSIS — R05 Cough: Secondary | ICD-10-CM | POA: Insufficient documentation

## 2010-12-05 DIAGNOSIS — R0609 Other forms of dyspnea: Secondary | ICD-10-CM | POA: Insufficient documentation

## 2010-12-05 DIAGNOSIS — R059 Cough, unspecified: Secondary | ICD-10-CM | POA: Insufficient documentation

## 2010-12-05 DIAGNOSIS — G8929 Other chronic pain: Secondary | ICD-10-CM | POA: Insufficient documentation

## 2010-12-05 DIAGNOSIS — R51 Headache: Secondary | ICD-10-CM | POA: Insufficient documentation

## 2010-12-05 DIAGNOSIS — J40 Bronchitis, not specified as acute or chronic: Secondary | ICD-10-CM | POA: Insufficient documentation

## 2010-12-05 DIAGNOSIS — R6883 Chills (without fever): Secondary | ICD-10-CM | POA: Insufficient documentation

## 2010-12-05 DIAGNOSIS — Z79899 Other long term (current) drug therapy: Secondary | ICD-10-CM | POA: Insufficient documentation

## 2010-12-05 DIAGNOSIS — R0789 Other chest pain: Secondary | ICD-10-CM | POA: Insufficient documentation

## 2010-12-05 DIAGNOSIS — M1990 Unspecified osteoarthritis, unspecified site: Secondary | ICD-10-CM | POA: Insufficient documentation

## 2010-12-05 DIAGNOSIS — R070 Pain in throat: Secondary | ICD-10-CM | POA: Insufficient documentation

## 2010-12-05 LAB — POCT I-STAT, CHEM 8
BUN: 10 mg/dL (ref 6–23)
Calcium, Ion: 1.13 mmol/L (ref 1.12–1.32)
Creatinine, Ser: 1 mg/dL (ref 0.4–1.2)
Hemoglobin: 12.2 g/dL (ref 12.0–15.0)
Sodium: 136 mEq/L (ref 135–145)
TCO2: 27 mmol/L (ref 0–100)

## 2010-12-05 LAB — URINALYSIS, ROUTINE W REFLEX MICROSCOPIC
Glucose, UA: NEGATIVE mg/dL
Hgb urine dipstick: NEGATIVE
Protein, ur: NEGATIVE mg/dL
Specific Gravity, Urine: 1.031 — ABNORMAL HIGH (ref 1.005–1.030)
pH: 5.5 (ref 5.0–8.0)

## 2010-12-05 LAB — POCT CARDIAC MARKERS: Myoglobin, poc: 35.8 ng/mL (ref 12–200)

## 2010-12-09 NOTE — Op Note (Signed)
[name], [name]                    ACCOUNT NO.:  [id number]   MEDICAL RECORD NO.:  [id number]          PATIENT TYPE:  INP   LOCATION:                                FACILITY:  [facility]   PHYSICIAN:  Lenoard Aden, M.D.DATE OF BIRTH:  1976/04/09   DATE OF PROCEDURE:  DATE OF DISCHARGE:  09/15/2007                               OPERATIVE REPORT   PREOPERATIVE DIAGNOSES:  1. Class A2 diabetes mellitus with mature amnio and worsening glucose      control.  2. 38-week intrauterine pregnancy.  3. Neurologic complications and spinal disk issues requiring per      neurology a need for primary C-section.   POSTOPERATIVE DIAGNOSES:  1. Class A2 diabetes mellitus with mature amnio and worsening glucose      control.  2. 38-week intrauterine pregnancy.  3. Neurologic complications and spinal disk issues requiring per      neurology a need for primary C-section.   PROCEDURE:  Primary low segment transverse cesarean section.   SURGEON:  Lenoard Aden, M.D.   ASSISTANT:  Arvid Right, C.N.M.   ANESTHESIA:  Spinal by Angelica Pou, MD   ESTIMATED BLOOD LOSS:  1000 cc.   COMPLICATIONS:  None.   DRAINS:  None.   DRAINS:  Foley   COUNTS:  Correct.   CONDITION:  Patient to recovery in good condition.   FINDINGS:  Full-term living female, occiput transverse position.  Apgars  8 and 9.  Pediatrician is in attendance.  Placenta from a posterior  location intact.  Three-vessel cord noted, normal tubes, normal ovaries.  Two-layer uterine closure.   BRIEF OPERATIVE NOTE:  Being appraised of the risks of anesthesia,  infection, bleeding, injury to abdominal organs, need for repair  __________ , complications to include bowel and bladder injury, the  patient was brought to the operating room where she was administered  spinal anesthetic without complications, prepped and draped in the usual  sterile fashion.  Foley catheter was placed.  After achieving adequate  anesthesia, Marcaine  solution placed, a Pfannenstiel skin incision was  made with a scalpel and carried down to fascia which was nicked in the  midline and __________ transverse using Mayo scissors.  Rectus muscles  were dissected sharply in the midline.  Peritoneum entered sharply,  bladder blade place.  Visceral peritoneum scored sharply off the lower  uterine segment.  Curved hysterotomy incision made.  Amniotomy, clear  fluid, atraumatic, delivery of full-term living female, handed to  pediatrician in attendance.  Apgars as noted.  Cord blood collected.  Placenta delivered manually  intact.  Three-vessel cord and uterus were  curetted using a dry lap pack and closed in 2 running imbricating layers  of 0 Monocryl suture.  The bladder flap was felt to be hemostatic.  Irrigation accomplished.  Fascia then closed using a 0 Monocryl stitch  in running fashion and subcutaneous tissue reapproximated using a 2-0  plain in a continuous running fashion.  Skin closed using staples.  The  patient tolerated the procedure well and was transferred to recovery in  good condition.      Lenoard Aden, M.D.  Electronically Signed     RJT/MEDQ  D:  09/12/2007  T:  09/13/2007  Job:  [id number]

## 2010-12-09 NOTE — Discharge Summary (Signed)
[name], [name]                    ACCOUNT NO.:  [id number]   MEDICAL RECORD NO.:  [id number]          PATIENT TYPE:  INP   LOCATION:                                FACILITY:  [facility]   PHYSICIAN:  Lenoard Aden, M.D.DATE OF BIRTH:  09/24/2009   DATE OF ADMISSION:  09/12/2007  DATE OF DISCHARGE:                               DISCHARGE SUMMARY   CHIEF COMPLAINT:  Incisional pain and discomfort.   HISTORY OF PRESENT ILLNESS:  The patient is a 35 year old white female  status post uncomplicated primary C-section who presents for followup of  incisional pain and discomfort, currently on Keflex, Percocet, Xanax,  and Paxil.  She has a history of Class A2 diabetes mellitus during  pregnancy which was well controlled.   ALLERGIES:  PENICILLIN, AVELOX, LEVAQUIN, and PHENERGAN.   SOCIAL HISTORY:  Nonsmoker, nondrinker.  Denies domestic physical  violence.   FAMILY HISTORY:  Hypertension, heart disease, diabetes, kidney disease,  depression, leukemia.   PERSONAL HISTORY:  1. C-section as noted.  2. Degenerative disk disease.  3. Tonsillectomy.  4. Gastroesophageal reflux disease.  5. Hypothyroidism.  6. HSV.  7. Uterine fibroids.   PHYSICAL EXAMINATION:  GENERAL:  She is an obese white female in no  acute distress.  HEENT:  Normal.  LUNGS:  Clear.  HEART:  Regular rate and rhythm.  ABDOMEN:  Soft, nontender.  Incision is well healed and intact.  Small  superficial erythema noted.  No abscess palpable.  EXTREMITIES:  No cords.  NEUROLOGIC:  Nonfocal.  SKIN:  Intact.  PELVIC:  Exam is deferred.   White blood cell count is just about 7000 and within normal limits.   IMPRESSION:  No evidence of incisional cellulitis.   PLAN:  Finish Keflex and local care discussed.  Follow up in the office  as scheduled.      Lenoard Aden, M.D.  Electronically Signed     RJT/MEDQ  D:  09/24/2007  T:  09/25/2007  Job:  [id number]

## 2010-12-12 NOTE — Op Note (Signed)
[facility]  Patient:    [name] Visit Number: [id number] MRN: [id number]          Service Type: OBV Location: 4W [id number] [id number] Attending Physician:  Pierce Crane Dictated by:   Javier Docker, M.D. Proc. Date: 01/19/02 Admit Date:  01/19/2002 Discharge Date: 01/21/2002                             Operative Report  PREOPERATIVE DIAGNOSIS:  Recurrent disk herniation L5-S1, left.  POSTOPERATIVE DIAGNOSIS:  Recurrent disk herniation L5-S1, left.  PROCEDURE PERFORMED:  Redo microdiskectomy L5-S1, left.  ANESTHESIA:  General.  SURGEON:  Javier Docker, M.D.  ASSISTANT:  Georges Lynch. Gioffre, M.D.  BRIEF HISTORY AND INDICATION:  This is a 35 year old, who is 6-7 weeks status post a diskectomy.  The patient over the past week had developed severe recurrent left lower extremity radicular pain with MRI indicating recurrent disk herniation at L5-S1.  Operative intervention was indicated for a redo diskectomy to decompress the S1 nerve root.  She had positive neural tension sign and a large compressive mass.  Diminished sensation in the S1 dermatome, diminished Achilles reflex.  The risks and benefits discussed including bleeding, infection, damage to vascular structures, CSF leakage, epidural fibrosis, recurrent disk herniation, need for fusion in the future, etc.  TECHNIQUE:  Patient in supine position.  After an adequate level of general anesthesia and vancomycin, was placed prone on the [place] frame.  All bony prominences were well-padded.  The lumbar region was prepped and draped in the usual sterile fashion.  The previous surgical incision was utilized, and surgical scar excised.  It was extended cephalad and caudad.  Subcutaneous tissue was dissected.  Electrocautery was utilized to achieve hemostasis.  The fascia lata was identified and divided in line with the skin incision. Paraspinous muscles elevated from the lamina of L5 and S1.   Confirmatory radiograph had been obtained.  Epidural fibrosis was noted, and a large curette was utilized to skeletonize the L5 lamina and the S1 lamina.  A confirmatory radiograph was obtained with a Penfield four in the inferior aspect of the interlaminar space of L5-S1.  Next, meticulously with the operative scope draped and brought into the surgical field, we enlarged the foraminotomy of S1, identified the S1 nerve root, gently mobilized it medially.  We detached the epidural fibrosis from the lateral recess decompression utilizing the straight curette.  This was detached, and the hemilaminotomy of L5 was enlarged as well utilizing the 2 mm and 3 mm Kerrison, again detached the adhesions.  This was then further mobilized with a Penfield four.  Next, foraminotomy of L5 was performed with the 2 mm Kerrison.  The pedicle was identified medially.  Just off the pedicle and cephalad, was extensive epidural fibrosis and epidural venous plexus, and electrocautery was utilized to achieve hemostasis.  There was a large recurrent disk herniation.  I had lifted up the posterior longitudinal ligament beneath the S1 nerve root.  This was retrieved with the nerve hook, and a pituitary large fragment was retrieved.  I then entered the disk space with an Epstein curette followed by pituitary and performed a thorough diskectomy.  No residual disk material was noted there that was available for retrieval nor in the axilla of the nerve root or into the foramen of L5 or S1.  The nerve root was freely mobile with 1 cm of excursion medial to the pedicle  following the decompression. Hockey-stick probe placed in the foramen of L5 and S1 and found to be widely patent.  There was no evidence of CSF leakage or active bleeding.  The disk space was copiously irrigated with antibiotic irrigation.  Thrombin-soaked Gelfoam was placed in the laminotomy defect.  McCullough retractor was removed.  Paraspinous muscles  were inspected with no evidence of active bleeding.  The dorsolumbar fascia reapproximated with #1 Vicryl interrupted figure-of-eight suture.  Subcutaneous tissue reapproximated with 2-0 Vicryl simple sutures.  Skin was reapproximated with 3-0 subcuticular Prolene.  The wound was reinforced with Steri-Strips.  Sterile dressing applied.  The patient was placed supine on the hospital bed and extubated without difficulty and transported to the recovery room in satisfactory condition.  The patient tolerated the procedure well with no complications. Dictated by:   Javier Docker, M.D. Attending Physician:  Pierce Crane DD:  01/19/02 TD:  01/21/02 Job: [id number]

## 2010-12-12 NOTE — Op Note (Signed)
[name], [name]                    ACCOUNT NO.:  [id number]   MEDICAL RECORD NO.:  [id number]          PATIENT TYPE:  AMB   LOCATION:  SDS                          FACILITY:  [facility]   PHYSICIAN:  Sharolyn Douglas, M.D.        DATE OF BIRTH:  1976/02/02   DATE OF PROCEDURE:  12/31/2005  DATE OF DISCHARGE:  12/31/2005                                 OPERATIVE REPORT   DIAGNOSES:  1.  Left S1 radiculopathy.  2.  Post-laminectomy syndrome.   PROCEDURE:  1.  Left S1 transforaminal epidural steroid injection.  2.  Fluoroscopic imaging used for epidural injection.   SURGEON:  Sharolyn Douglas, M.D.   ASSISTANT:  None.   ANESTHESIA:  MAC plus local.   ESTIMATED BLOOD LOSS:  None.   COMPLICATIONS:  None.   INDICATIONS:  The patient is a pleasant 35 year old female with chronic  persistent pain in her left leg consistent with an S1 radiculopathy.  She is  status post previous L5-S1 fusion.  Her CT scan shows scar tissue around the  S1 nerve root.  She has elected to undergo a left S1 transforaminal epidural  injection in hopes of quieting down some of her inflammation and pain.  Risks and benefits were reviewed.   DESCRIPTION OF PROCEDURE:  She was identified in the holding area.  The left  side of her back was marked.  She was taken to the operating room.  She  underwent sedation by Anesthesia and she was turned prone.  Back was prepped  and draped in the usual sterile fashion.  Fluoroscopy was used with oblique  imaging to visualize the left S1 foramen.  The overlying skin was  anesthetized with 1% lidocaine.  A 22-gauze Quincke-type spinal needle was  then advanced towards the S1 foramen.  Aspiration was performed and there  was no CSF or blood.  One-half milliliter of 300 Omnipaque was injected and  there was good epidural spread.  We then injected a solution of 80 mg of  Depo-  Medrol and 5 mL of preservative-free lidocaine.  The spinal needle was  removed, Band-aid placed.  The patient was  turned supine and transferred to  Recovery in stable condition.  Neurologically, she was intact.   Post-injections instructions were reviewed.  She will follow up in 2 weeks.      Sharolyn Douglas, M.D.  Electronically Signed     MC/MEDQ  D:  12/31/2005  T:  01/01/2006  Job:  [id number]

## 2010-12-12 NOTE — Op Note (Signed)
[name], [name]                    ACCOUNT NO.:  [id number]   MEDICAL RECORD NO.:  [id number]          PATIENT TYPE:  AMB   LOCATION:  [facility]                         FACILITY:  [facility]   PHYSICIAN:  Sharolyn Douglas, M.D.        DATE OF BIRTH:  August 29, 1975   DATE OF PROCEDURE:  11/24/2005  DATE OF DISCHARGE:                                 OPERATIVE REPORT   DIAGNOSES:  1.  Left S1 radiculopathy.  2.  Lumbar degenerative disk disease, history of previous L5-S1 fusion.   PROCEDURE:  1.  Left S1 transforaminal epidural steroid injection.  2.  Fluoroscopic imaging views for S2 transforaminal injection.   SURGEON:  Sharolyn Douglas, M.D.   ASSISTANT:  None.   ANESTHESIA:  MAC plus local.   ESTIMATED BLOOD LOSS:  None.   COMPLICATIONS:  None.   INDICATION:  The patient is a pleasant 35 year old female who has a history  of __________  fusion at L5-S1.  She did well until recently, when she  developed a left S1 radiculopathy.  She presents now for left S1  transforaminal epidural steroid injection for further diagnostic and  therapeutic purposes.  Risks and benefits were reviewed.   DESCRIPTION OF PROCEDURE:  The patient was identified in the holding area  and taken to the operating room.  She underwent sedation.  She was turned  prone onto the operating table, all bony prominences were padded, her face  and eyes were protected.  The back was prepped and draped in the usual  sterile fashion.  Fluoroscopy was brought into the field and imaging was  obtained to reveal the left S1 foramen.  The skin over the foramen was  anesthetized with 3 mL of 1% lidocaine.  A 20-gauge 3-1/2-inch Tuohy needle  was then passed into the S1 neural foramen using fluoroscopic imaging.  Aspiration was performed and there was no blood or CSF.  One milliliter of  Omnipaque was injected and there was spread of the contrast material noted  within the sacral canal.  We then injected a solution of 80 mg of Depo-  Medrol and  5 mL of 1% preservative-free lidocaine.  The Tuohy needle was  removed and a Band-Aid placed.  The patient was turned supine and  transferred to Recovery in stable condition.   She was neurologically intact in the recovery room.  I reviewed her  instructions with her.  If she any increasing fevers or pain, she will call  the office.  She was given a prescription for Percocet, 20 tablets, to use  tonight.  I will see her in the office in 2 weeks.      Sharolyn Douglas, M.D.  Electronically Signed     MC/MEDQ  D:  11/24/2005  T:  11/25/2005  Job:  [id number]

## 2010-12-12 NOTE — Op Note (Signed)
NAMESHARIN, [name]                       ACCOUNT NO.:  [id number]   MEDICAL RECORD NO.:  [id number]                   PATIENT TYPE:  OBV   LOCATION:  0098                                 FACILITY:  [facility]   PHYSICIAN:  Jene Every, M.D.                 DATE OF BIRTH:  1976-06-24   DATE OF PROCEDURE:  01/23/2004  DATE OF DISCHARGE:                                 OPERATIVE REPORT   PREOPERATIVE DIAGNOSES:  1. Spinal stenosis.  2. Recurrent disk herniation L5 S1 left.   PROCEDURE PERFORMED:  1. Redo decompression L5 S1.  2. Foraminotomy L5 and S1.  3. Microdiskectomy L5 S1.   ANESTHESIA:  General.   ASSISTANT:  __________.   BRIEF HISTORY AND INDICATION:  This is a 35 year old female who has had a  history of a diskectomy at L5 S1.  The patient had a recurrent disk  herniation with S1 nerve root compression confirmed with MRI.  She was  refractory to conservative treatment and therefore indicated for a redo  decompression.  The risks and benefits were discussed including bleeding,  infection, damage to surrounding structures, CSF leakage, epidural fibrosis,  __________disease, need for fusion in the future, etc.   TECHNIQUE:  With the patient in the supine position after adequate induction  of general anesthesia and 500 mg of vancomycin, placed prone on the Andrews  frame, all bony prominences were well padded.  The lumbar area was prepped  and draped in the usual sterile fashion.  A 15 gauge spinal needle was  utilized to localize the 5-1 interspace, confirmed with x-ray.  An incision  was made from the spinous process of 5 to S1.  The subcutaneous tissue  __________ utilized to achieve hemostasis.  The dorsal lumbar fascia was  identified, divided in line with the skin incision.  The paraspinous process  was elevated from lamina to 5 and S1.  A McCullough retractor was placed,  the operating microscope was draped and brought onto the surgical field.  Skeletonized the  lamina 5 and S1.  We mobilized the S1 nerve root and the S1  foramen in its own fatty tissue in case with epidural fibrosis this  mobilized medially.  At a point just cephalad and medial to the pedicle a  fragment was identified and consistent with that seen on the MRI.  It was  just underneath and compressing the S1 nerve root.  This was incised and the  disk material was removed.  This was scar tissue and a posterior  longitudinal ligament.  All compressing the root.  Foraminotomy L5 was  performed.  A hockey stick probe placed in foramen of 5 and S1, found to be  widely patent following decompression.  I checked the axilla beneath the  nerve root, there was good excursion with no residual compression noted.  In  a piecemeal fashion, we removed any ruptured disk material within the disk  space itself.  This was with straight and upbiting pituitary.  Next, the  wound was copiously irrigated.  Inspection revealed no evidence of CSF,  leakage or active bleeding.  Thrombin-soaked Gelfoam was placed in the  laminotomy defect.  McCullough retractor was removed.  This confirmed the  disk space with an x-ray.  McCullough retractor was removed.  Paraspinous  muscle was inspected, there was no evidence of active bleeding.  The dorsal  lumbar fascia was reapproximated with #1 Vicryl, interrupted figure-of-eight  sutures.  Subcutaneous tissues reapproximated with 2-0 Vicryl simple  sutures,  skin was reapproximated with 4-0 subcuticular Prolene.  A sterile dressing  applied.  Placed supine on the hospital bed, extubated without difficulty  and transported to the recovery room in satisfactory condition.   The patient tolerated the procedure well, no complications.                                               Jene Every, M.D.    Cordelia Pen  D:  01/23/2004  T:  01/23/2004  Job:  [id number]

## 2010-12-12 NOTE — Op Note (Signed)
NAME:  [name]                        ACCOUNT NO.:  [id number]   MEDICAL RECORD NO.:  [id number]                   PATIENT TYPE:  AMB   LOCATION:  DSC                                  FACILITY:  [facility]   PHYSICIAN:  Jefry H. Pollyann Kennedy, M.D.                DATE OF BIRTH:  20-Aug-1975   DATE OF PROCEDURE:  09/24/2003  DATE OF DISCHARGE:                                 OPERATIVE REPORT   PREOPERATIVE DIAGNOSES:  Chronic tonsillitis.   POSTOPERATIVE DIAGNOSES:  Chronic tonsillitis.   OPERATION PERFORMED:  Tonsillectomy.   SURGEON:  Jefry H. Pollyann Kennedy, M.D.   ANESTHESIA:  General endotracheal.   COMPLICATIONS:  None.   ESTIMATED BLOOD LOSS:  5 mL.   FINDINGS:  Moderately enlarged tonsils with cryptic spaces and fibrosis of  the mucosal surface.   REFERRING PHYSICIAN:  Robert P. Merla Riches, M.D.   INDICATIONS FOR PROCEDURE:  The patient is a 35 year old female with a  lifelong history of recurring tonsillopharyngitis including streptococcal  disease.  The risks, benefits, alternatives and complications of the  procedure were explained to the patient, who seemed to understand and agreed  to surgery.   DESCRIPTION OF PROCEDURE:  The patient was taken to the operating room and  placed on the operating table in the supine position.  Following induction  of general endotracheal anesthesia, the patient was draped in standard  fashion.  A Crowe-Davis mouth gag was inserted into the oral cavity and used  to retract the tongue and mandible and attached to the Mayo stand.  The  tonsillectomy was performed using electrocautery dissection, carefully  dissecting the avascular plane between the capsule and the constrictor  muscles.  Spot cautery was used as needed for small bleeders.  Tonsils were  sent together for pathologic evaluation.  The pharynx was suctioned of  secretions and blood and irrigated with saline solution.  An orogastric tube  was used to aspirate the contents of the  stomach.  The pharynx was  reinspected and there was no bleeding.  The patient was then awakened,  extubated and transferred to recovery in stable condition.                                               Jefry H. Pollyann Kennedy, M.D.    JHR/MEDQ  D:  09/24/2003  T:  09/24/2003  Job:  [id number]

## 2010-12-12 NOTE — Consult Note (Signed)
NAME:  [name] [facility]                        ACCOUNT NO.:  [id number]   MEDICAL RECORD NO.:  [id number]                   PATIENT TYPE:  EMS   LOCATION:  ED                                   FACILITY:  [facility]   PHYSICIAN:  Zola Button T. Lazarus Salines, M.D.              DATE OF BIRTH:  1976/07/02   DATE OF CONSULTATION:  10/04/2003  DATE OF DISCHARGE:                                   CONSULTATION   OTORHINOLARYNGOLOGY EMERGENCY ROOM CONSULTATION:   CHIEF COMPLAINT:  Post-tonsillectomy bleeding.   HISTORY:  [age]-year-old white female, 11 days status post  tonsillectomy had an episode of bleeding approximately 48 hours ago  controlled in the operating room by Dr. Pollyann Kennedy.  She is not exactly sure what  he found or did but the bleeding was on the right side.  This afternoon,  sitting watching TV, she once again had some bleeding from her throat which  has ebbed and waned slightly over the past 2 hours.  The total quantity of  blood loss does not seem great.  She has no known bleeding tendencies.  No  hypertension.  She was not exerting herself and in fact was eating a soft  muffin at the time.  She has not taken any aspirin products recently.  No  past history of bleeding disorders of any kind.  In the emergency room,  platelet count 249,000, hematocrit 36, prothrombin time 18 with an INR of 1  and a partial thromboplastin time 35.   EXAMINATION:  This is a slightly overweight but basically healthy-appearing  adult white female.  She is not actively bleeding but has a Kleenex and  emesis basins filled with spotted blood surrounding her.  She is breathing  comfortably.  She does not appear particularly distressed.   Anterior nose is clear.  Oral cavity is moist.  No trismus.  She has an  adherent clot in the right tonsil bed.  The left tonsil bed seems to be  healing nicely.   IMPRESSION:  Post-tonsillectomy hemorrhage.   PLAN:  I explained to her that I was not certain that this was  not the same  site Dr. Pollyann Kennedy worked on or it could simply be the typical release of the  eschar 10-12 Mullet range with less significant bleeding.  The presence of an  adherent clot suggests that this was more significant.   With informed consent, I had her gargle 30 mL of 4% topical Xylocaine for  topical anesthesia.  Following this, I infiltrated a total of 20 mL of 1%  Xylocaine with 1:100,000 epinephrine in stages using a 25-guage spinal  needle throughout the tonsil fossa on the right side.  She tolerated this  nicely.  Adequate time was allowed for anesthesia to take effect.   After achieving anesthesia, with the suction cautery setup at the bedside  and ready to go, the clot was removed with the forceps.  There  was no  significant bleeding noted.  There were several areas of apparent  granulation tissue and adherent clot which was suctioned free.  Several  suspicious areas were suction coagulated superficially.  Patient tolerated  this beautifully.  After completing the cautery, several minutes were  allowed to pass and reinspection revealed no additional bleeding sites.  The  patient was very cooperative and I was able to depress base of tongue and  look at the inferior tonsil fold with no significant bleeding noted there  either.  Upon completing the cautery, hemostasis was achieved and the  patient did tolerate the procedure nicely.   She will continue her Roxicet for pain control and we will give her 10 mL  here in the emergency room.  She will return in 1 week to see Dr. Pollyann Kennedy as  previously scheduled.  I encourage her to resume only mild activities over  the next 3-4 days until this is fully healed.  She is welcome to advance  diet as comfortable.                                               Gloris Manchester. Lazarus Salines, M.D.    KTW/MEDQ  D:  10/04/2003  T:  10/05/2003  Job:  [id number]   cc:   Enrigue Catena H. Pollyann Kennedy, M.D.  [address] [place]  [place] [address]  Fax: [phone]

## 2010-12-12 NOTE — H&P (Signed)
[name], [name]                    ACCOUNT NO.:  [id number]   MEDICAL RECORD NO.:  [id number]          PATIENT TYPE:  INP   LOCATION:  NA                           FACILITY:  [facility]   PHYSICIAN:  Sharolyn Douglas, M.D.        DATE OF BIRTH:  06-25-1976   DATE OF ADMISSION:  05/27/2005  DATE OF DISCHARGE:                                HISTORY & PHYSICAL   CHIEF COMPLAINT:  Pain in my back and hips.   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  The  original injury occurred in 1993 when she fell off a horse. Subsequent to  that, she did fairly well on and off with back discomfort until 2002 when  she had microdiskectomy at L5-S1.  Unfortunately, 3 weeks after that surgery  she had re-rupture and was taken back to the operating room for repeat  surgery.  She did well until 2005 and had a re-rupture of a disk at L5-S1  and had the third surgery.  This was again at L5-S1.  This helped her for  about 3 months, but unfortunately, now she is having marked pain into her  low back which is persistent and increasing.  She has left lower extremity  radiculopathy as well.  She has weakness in the left foot and difficulty  walking.  She now has to use a Duragesic patch, Vicodin, Tramadol, Robaxin  (which she cannot tolerate).  We have tried conservatives but unfortunately  have not ameliorated her discomfort. After much discussion including the  risks and benefits of surgery, decided to go ahead with surgical  intervention with L5-S1 posterior spinal fusion.   PAST MEDICAL HISTORY:  This lady has been in relatively good health  throughout her lifetime. She does have migraines, 1 or 2 annually.  She has  had anxiety in the past but has no recent problems.  Pneumonia in 2000,  reflux disease, and diverticulitis.  She has occasional mucus in her stool.   ALLERGIES:  PENICILLIN, LEVAQUIN, and currently  ROBAXIN which is causing  itching in her arms.   CURRENT MEDICATIONS:  1.  Ultram twice daily.  2.  Vicodin p.r.n.  3.  Pepcid Complete or Protonix for reflux.  4.  Duragesic patch 25 mcg per Dr. Ethelene Hal.  5.  We will use Flexeril for her muscle relaxant.   The family physician is Dr. Yong [facility] [place].   PAST SURGICAL HISTORY:  Includes three laminectomies, microdiskectomies as  mentioned above, and tonsillectomy in 2005 at which time she had multiple  hemorrhages to the tonsils but has no sequelae once that was taken care of.   FAMILY HISTORY:  Positive for heart disease in father at 2 years of age,  hypertension in mother at 36 years of age, diabetes in one grandparent at 69  years of age.  Grandmother had leukemia.   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  No intake of alcohol or tobacco  products. Has no children.   REVIEW OF SYSTEMS:  CNS: No seizure disorder, paralysis, double vision.  The  patient has radiculopathy and radiculitis as mentioned above.  CARDIOVASCULAR: No chest pain, no angina or orthopnea. GASTROINTESTINAL: No  nausea, vomiting, melena, or blood stool. GENITOURINARY: No discharge or  hematuria.  MUSCULOSKELETAL: Primarily as in History of Present Illness.   PHYSICAL EXAMINATION:  GENERAL: Alert, cooperative, and friendly, somewhat  anxious 35 year old white female.  VITAL SIGNS: Blood pressure 106/58, pulse 100, respirations 28.  GENERAL:  The patient moves very carefully during the examination,  occasionally splints to unload her low back with placing arms on exam table.  HEENT:  Normocephalic.  PERRLA.  EOM intact.  Oropharynx clear.  CHEST: Clear to auscultation.  No rhonchi or rales.  HEART:  Regular rate and rhythm.  No murmurs are heard.  ABDOMEN:  Soft, nontender.  Liver and spleen not felt.  GENITALIA/RECTAL/PELVIC/BREASTS: Not done, not pertinent to present  illness.  EXTREMITIES:  Positive straight leg raise on the left with radiation into  the buttock and back. She also has loss of sensory in the L5 distribution on  the left.   ADMISSION DIAGNOSES:  1.  L5-S1 degenerative disk disease.  2.  History of migraines.  3.  Reflux.   PLAN:  The patient will undergo L5-S1 posterior spinal fusion utilizing  pedicle screws, transforaminal lumbar interbody fusion with allograft and  bone morphogenic protein.  Today the patient is fitted with an EBI  lumbosacral arthrosis which will be used postoperatively.  We wrote her a  prescription for Flexeril as a muscle relaxant.   This History and Physical was performed in our office on May 20, 2005.      Dooley L. Cherlynn June.      Sharolyn Douglas, M.D.  Electronically Signed    DLU/MEDQ  D:  05/20/2005  T:  05/20/2005  Job:  [id number]   cc:   Dr. Merla Riches, [facility] [place]

## 2010-12-12 NOTE — H&P (Signed)
NAME:  [name] [facility]                        ACCOUNT NO.:  [id number]   MEDICAL RECORD NO.:  [id number]                   PATIENT TYPE:  OBV   LOCATION:  2550                                 FACILITY:  [facility]   PHYSICIAN:  Jefry H. Pollyann Kennedy, M.D.                DATE OF BIRTH:  [birth year]   DATE OF ADMISSION:  10/02/2003  DATE OF DISCHARGE:                                HISTORY & PHYSICAL   ADMISSION DIAGNOSIS:  Post tonsillectomy hemorrhage.   SERVICE:  ENT.   PHYSICIAN:  Jefry H. Pollyann Kennedy, M.D.   HISTORY:  This is a 35 year old lady who underwent tonsillectomy  approximately a week ago.  Her husband called me this evening to say that  she was coughing up fresh blood and vomiting old blood.  I instructed them  to go immediately to the emergency department.  She was doing fine until  this evening, when she started having bleeding.  She feels pain on the right  side of her throat.  The last time she ate or drank anything was  approximately 8 last evening.  No history of blood disorders.   PHYSICAL EXAMINATION:  GENERAL:  She is a healthy-appearing lady.  VITAL SIGNS:  She has stable vital signs and is afebrile.  THROAT:  On examination, there is a blood clot adherent to the inferior pole  of the right side.  There is no active bleeding at this time.  She has some  old bloody emesis in the basin.  She is otherwise stable.   IMPRESSION:  Post tonsillectomy hemorrhage.   PLAN:  The plan is to bring to the operating room for examination under  anesthesia and cauterization of post tonsillectomy hemorrhage.                                                Jefry H. Pollyann Kennedy, M.D.    JHR/MEDQ  D:  10/02/2003  T:  10/02/2003  Job:  [id number]

## 2010-12-12 NOTE — Discharge Summary (Signed)
NAMEIDABELLE, MCPETERS                    ACCOUNT NO.:  [id number]   MEDICAL RECORD NO.:  [id number]          PATIENT TYPE:  INP   LOCATION:  7425                         FACILITY:  [facility]   PHYSICIAN:  Sharolyn Douglas, M.D.        DATE OF BIRTH:  [birth year]   DATE OF ADMISSION:  05/27/2005  DATE OF DISCHARGE:  05/30/2005                                 DISCHARGE SUMMARY   ADMITTING DIAGNOSES:  1.  Degenerative disk disease with severe pain L5-S1.  2.  History of migraines.  3.  Gastroesophageal reflux disease.   DISCHARGE DIAGNOSES:  1.  Status post L5-S1 posterior spinal fusion, doing well.  2.  Postoperative blood loss anemia.  3.  Mild postoperative hyponatremia.  4.  __________.   PROCEDURE:  On May 27, 2005, the patient was taken to the operating room  for an L5-S1 posterior spinal fusion with pedicle screws and PLIF. This was  done by Sharolyn Douglas, M.D. Assistant: Verlin Fester, PAC. Anesthesia used was  general.   CONSULTS:  None.   LABS:  Preoperative labs were obtained May 22, 2005. A CBC with diff was  within normal limits. Coags were within normal limits. Complete metabolic  panel was within normal limits with the exception of an ALT of 37.  Postoperatively, H&H monitored x 3 days. Reached a level of 11.6 and 33.7 on  May 30, 2005. She did not require any treatment. Basic metabolic panel  was monitored x 2 days postoperatively. She developed hyponatremia at 133 on  postoperative Hulsey 1 as well as glucose elevated at 106 on that Mayberry. These  both returned to normal by the following Cadmus. UA preoperatively was  negative. Blood typing was type A, Rh positive, antibody screen negative. X-  rays: Portable spine was used intraoperatively to confirm screw placement  and localized levels. No EKG seen on the chart.   BRIEF HISTORY:  The patient is a 35 year old female who has had problems  with her back for several years. Injury occurred back in 1993 when she fell  off a horse.  She did relatively well until 2002 when she had a  microdiskectomy and 3 weeks after that surgery unfortunately she had a  recurrent herniation and had to had a routine diskectomy. After that, she  did well until 2005 when she had a rupture of her disk at L5-S1 and had a  third surgery on that disk. Unfortunately, this only lasted approximately 3  months and because of her increasing pain, her degenerative disk disease as  well as back and lower extremity pain and is failing to improve with  conservative treatment so it was thought her best course of management would  be an L5-S1 decompression and fusion. The risks and benefits of this  procedure were discussed with the patient by Dr. Noel Gerold. She indicated  understanding and opted to proceed.   HOSPITAL COURSE:  On May 27, 2005, the patient was taken to the  operating room for the above listed procedure. She tolerated the procedure  well and was transferred to  the recovery room in stable condition.   Postoperatively, routine orthopedic spine protocol was followed and she did  progress extremely well throughout her postoperative course.   The patient had been on narcotic pain medications preoperatively and she was  restarted on those medications postoperatively with the addition of a PCA.  Her fentanyl patch was increased in the dosage.   Physical therapy and occupational therapy worked with the patient on a daily  basis with an aggressive ambulation program, back precautions and brace use.  She progressed well with them.   By May 30, 2005, the patient had met all orthopedic goals. She was  medically stable and ready for discharge to home.   DISCHARGE PLAN:  The patient is a 35 year old female status post L5-S1  posterior spinal fusion doing well. Activity is daily ambulation program  progressively, brace on when she is up, no lifting heavier than 5 pounds,  back precautions at all times, dressing changes until all drainage  stops,  keep incision dry until all drainage stops. Follow up in 2 weeks  postoperatively with Dr. Noel Gerold. Call for an appointment.   MEDICATIONS ON DISCHARGE:  1.  Fentanyl patch 50 mcg change every 3 days.  2.  Dilaudid 2 mg 1-2 every 4 hours p.r.n. pain.  3.  Vicodin 5 mg 1-2 every 4 hours p.r.n. pain taken at night.  4.  She was given these 2 separate medications because Vicodin makes her      very sleepy; therefore, she will use that for nighttime. The Dilaudid      does not and therefore she will use that during the Placeres.  5.  Flexeril 10 mg.  6.  Phenergan 25 mg.   DIET:  Regular home diet as tolerated.   CONDITION ON DISCHARGE:  Stable and improved.   DISPOSITION:  The patient is being discharged to her home with her family's  assistance as well as home health and physical therapy and occupational  therapy.      Verlin Fester, P.A.      Sharolyn Douglas, M.D.  Electronically Signed    CM/MEDQ  D:  08/26/2005  T:  08/26/2005  Job:  [id number]   cc:   [facility]

## 2010-12-12 NOTE — Op Note (Signed)
NAME:  [name] [facility]                        ACCOUNT NO.:  [id number]   MEDICAL RECORD NO.:  [id number]                   PATIENT TYPE:  OBV   LOCATION:  2550                                 FACILITY:  [facility]   PHYSICIAN:  Jefry H. Pollyann Kennedy, M.D.                DATE OF BIRTH:  [birth year]   DATE OF PROCEDURE:  10/02/2003  DATE OF DISCHARGE:                                 OPERATIVE REPORT   PREOPERATIVE DIAGNOSIS:  Post-tonsillectomy hemorrhage.   POSTOPERATIVE DIAGNOSIS:  Post-tonsillectomy hemorrhage.   PROCEDURE:  Examination under anesthesia with cauterization of post-  tonsillectomy hemorrhage.   There were no complications.  General endotracheal anesthesia was used.   FINDINGS:  Blood clot adherent to the right inferior pole.  A small bleeder  underlying this blood clot.  Granulation tissue throughout the tonsillar  beds bilaterally.  No other significant bleeding site.   HISTORY:  This is a 35 year old who underwent tonsillectomy about a week  ago.  She started having bleeding this evening.  Risks, benefits,  alternatives, and complications of the procedure were explained to the  patient and her husband, who seemed to understand and agreed to surgery.   DESCRIPTION OF PROCEDURE:  The patient was taken to the operating room and  placed on the operating table in supine position.  Following induction of  general endotracheal anesthesia, the table was turned 90 degrees.  The  patient was draped in standard fashion.  A Crowe-Davis mouth gag was  inserted into the oral cavity, used to retract the tongue and mandible, and  fastened to the Mayo stand.  Inspection of the pharynx revealed blood clot  adherent to the right inferior pole.  This was evacuated using suction.  Suction cautery was used to cauterize the right inferior pole.  A couple of  small additional bleeders on the right side were coagulated as well.  There  was no additional bleeding found.  The pharynx was suctioned of  blood and  secretions and an orogastric tube was used to aspirate the contents of the  stomach.  The patient was awakened, extubated, and transferred to recovery  in stable condition.                                               Jefry H. Pollyann Kennedy, M.D.    JHR/MEDQ  D:  10/02/2003  T:  10/02/2003  Job:  [id number]

## 2010-12-12 NOTE — Op Note (Signed)
NAMEMANDEE, PLUTA                    ACCOUNT NO.:  [id number]   MEDICAL RECORD NO.:  [id number]          PATIENT TYPE:  INP   LOCATION:  1610                         FACILITY:  [facility]   PHYSICIAN:  Sharolyn Douglas, M.D.        DATE OF BIRTH:  1976/01/02   DATE OF PROCEDURE:  05/27/2005  DATE OF DISCHARGE:                                 OPERATIVE REPORT   DIAGNOSES:  1.  Recurrent L5-S1 disk herniation.  2.  L5-S1 degenerative disk disease.   PROCEDURE:  1.  Revision L5-S1 hemilaminotomy with decompression of the left L5 and S1      nerve roots.  2.  Transforaminal lumbar interbody fusion, L5-S1, and placement of PEEK      prosthetic cage.  3.  Pedicle screw instrumentation, L5-S1, using the Abbott Spine System.  4.  Posterior spinal arthrodesis, L5-S1.  5.  Local autogenous bone graft supplemented with bone morphogenic protein.   SURGEON:  Sharolyn Douglas, M.D.   ASSISTANT:  Verlin Fester, P.A.   ANESTHESIA:  General endotracheal.   ESTIMATED BLOOD LOSS:  150 mL.   COMPLICATIONS:  None.   INDICATIONS:  The patient is a pleasant 35 year old female with chronic back  and left leg pain.  She has a recurrent disk rupture at L5-S1.  She is  status post 3 previous diskectomies at this level.  She has failed to  respond to conservative treatment and at this time, has elected to undergo  L5-S1 revision, decompression and fusion in hopes of improving her symptoms.  Risk and benefits were reviewed and she has elected to proceed.   PROCEDURE:  She was identified in the holding area and taken to the  operating room, underwent general endotracheal anesthesia without difficulty  and given prophylactic IV antibiotics.  Neuro monitoring was established in  the form of SSEPs and lower extremity EMGs.  She was turned prone onto the  Wilson frame, all bony prominences padded, face and eyes protected at all  times, back prepped and draped in the usual sterile fashion.  The previous  midline incision was  utilized and extended several centimeters in each  direction.  Dissection was carried through the previous scar.  Subperiosteal  exposure was carried out the tips of the transverse processes of L5 and  sacral ala bilaterally.  The previous laminotomy on the left side was  identified and care was taken not to enter the spinal canal inadvertently.  Intraoperative x-ray was taken to confirm levels.  We then completed a  revision laminectomy at L5-S1 on the left side by removing the spinous  process of L5 as well as the lamina on the left.  The previous edges of the  laminotomy were cleaned of epidural fibrosis using microcurettes.  The  decompression was carried out laterally, identifying the L5-S1 nerve roots.  There was extensive scar tissue about the S1 nerve root within the lateral  recess.  The decompression was carried laterally flush with the S1 pedicle.  We felt we had a good decompression of both nerve roots.  We then  turned our  attention to placing pedicle screws at L5 and S1 bilaterally using an  anatomic probing technique; 6.5-mm screws were used in L5 and 7.5-mm screws  used in the sacrum; we had good screw purchase.  The screws were stimulated  using triggered EMGs; there were no deleterious changes.  We then turned our  attention to completing a transforaminal lumbar interbody fusion of the left  side.  The remaining facette joint was osteotomized.  The exiting and  transversing nerve roots were identified and protected, and free-running  EMGs were monitored.  The disk space was entered.  It was completely  collapsed and we could barely get a small pituitary into the interspace.  Using intervertebral spreaders, the disk space was dilated up to 7 mm.  Scrapers were used to clear the end plates of cartilage.  This was carried  across to the contralateral side.  The disk space was then packed with BMP  sponges along with local bone graft obtained from the laminectomy.  Distraction  was applied to the pedicle screws.  A 7-mm PEEK spacer was then  packed with BMP sponge, inserted into the interspace, tamped anteriorly and  across the midline.  We completed the posterior spinal arthrodesis by  decorticating the transverse process of L5 and sacral ala bilaterally.  The  remaining local bone graft was packed into the lateral gutters between the  L5 transverse process and sacral ala.  We then placed 40-mm titanium rods  into the polyaxial screw heads.  Compression was applied before shearing off  the locking caps.  Hemostasis was achieved.  The wound was irrigated.  Deep Hemovac drain was  left in place.  Intraoperative x-ray showed appropriate positioning of  screws and PEEK spacer.  Deep fascia was closed with a running #1 Vicryl  sutures, subcutaneous layer closed with 0 Vicryl and 2-0 Vicryl.  A running  3-0 subcuticular Vicryl suture was used on the skin edges.  Benzoin and  Steri-Strips were applied and sterile dressing placed.  The patient was  turned supine, extubated without difficulty, and transferred to Recovery in  stable condition, able to move her upper and lower extremities.      Sharolyn Douglas, M.D.  Electronically Signed     MC/MEDQ  D:  05/27/2005  T:  05/28/2005  Job:  [id number]

## 2010-12-12 NOTE — Op Note (Signed)
[facility]  Patient:    [name] Visit Number: [id number] MRN: [id number]          Service Type: OBV Location: 3W [id number] [id number] Attending Physician:  Pierce Crane Dictated by:   Javier Docker, M.D. Proc. Date: 12/05/01 Admit Date:  12/05/2001 Discharge Date: 12/06/2001                             Operative Report  PREOPERATIVE DIAGNOSES:  Herniated nucleus pulposus L5-S1, left, lateral recess spinal stenosis.  POSTOPERATIVE DIAGNOSES:  Herniated nucleus pulposus L5-S1, left, lateral recess spinal stenosis.  OPERATION PERFORMED:  Lateral recessed decompression L5-S1, left; microdiskectomy L5-S1, left.  ANESTHESIA:  General.  SURGEON:  Della Goo, P.A.  BRIEF HISTORY:  This is a 35 year old refractory to S1 radicular pain secondary to HNP L5-S1 compressing the S1 nerve root. Operative intervention was indicated for decompression of the S1 nerve root by microdiskectomy. In addition, there was some lateral recessed stenosis noted as well so a partial medial hemifacetectomy was recommended. The risks and benefits were discussed including bleeding, infection, injury to neurovascular structures, CSF leakage, epidural fibrosis, need for fusion in the future, etc.  TECHNIQUE:  The patient in the supine position, after an adequate level of general anesthesia and vancomycin IV, she was placed prone on the Andrews frame and all bony prominences well padded. The lumbar region was prepped and draped in the usual sterile fashion. Two 18 gauge spinal needles were utilized to localize the 5-1 interspace confirmed with x-ray. An incision was made from the spinous process of 5 to S1, subcutaneous tissue was dissected, electrocautery utilized to achieve hemostasis. A skin incision was made from the spinous process of 5 to S1, subcutaneous tissue was dissected, electrocautery was utilized to achieve hemostasis. The dorsal lumbar fascia was  identified and divided in line with a skin incision. The paraspinous muscle was elevated from the lamina of 5 and S1. The McCullough retractor was placed, operative microscope was draped and brought onto the surgical field after a confirmatory radiograph was obtained with a Penfield 4 in the intralaminar space. The ligamentum flavum was detached along the cephalad edge of S1 utilizing a straight curette. A laminotomy was then performed as well as a foraminotomy of S1. Ligamentum flavum was removed from the interspace. Prominent epidural fat was noted. The nerve root was identified, compressed laterally and the lateral recess was gently mobilized medially. The lateral recess was decompressed with a 2 mm Kerrison under cutting the superior articulating process of S1. A large prominent epidural vein was noted, electrocautery was utilized to achieve hemostasis. This focal HNP excreted from the 5-1 disk space caudad. With the nerve root well protected, annulotomy was performed with an incision over the posterior longitudinal ligament and removing this extruded fragment. The disk space was entered with McCullough forceps and the disk material was removed from the disk space and the sub-annular space. Following this, the axilla of the root was identified in the thecal sac. There was no evidence of residual disk material after a thorough diskectomy was performed. A hockey stick probe was placed in the foramen of 5 and S1 and found to be widely patent. The S1 nerve root was erythematous and edematous but freely mobile with 1 cm of free excursion. This disk space was copiously irrigated with no evidence of active bleeding or CSF leakage. The epidural fat was draped over the S1 nerve root. The  McCullough retractor was then removed, paraspinous muscles inspected with no evidence of active bleeding. The dorsolumbar fascia was reapproximated with #1 Vicryl, subcutaneous tissue reapproximated with 2-0  Vicryl simple sutures. The skin was reapproximated with 4-0 subcuticular Prolene, reinforced with Steri-Strips. A sterile dressing was applied. She was placed supine on the hospital bed, extubated without difficulty and transported to the recovery room in satisfactory condition.  The patient tolerated the procedure well with no complications. Dictated by:   Javier Docker, M.D. Attending Physician:  Pierce Crane DD:  12/05/01 TD:  12/06/01 Job: [id number]

## 2010-12-13 ENCOUNTER — Emergency Department (HOSPITAL_COMMUNITY)
Admission: EM | Admit: 2010-12-13 | Discharge: 2010-12-13 | Disposition: A | Discharge status: Home / Self Care | Payer: BC Managed Care – PPO | Attending: Emergency Medicine | Admitting: Emergency Medicine

## 2010-12-13 DIAGNOSIS — I82629 Acute embolism and thrombosis of deep veins of unspecified upper extremity: Secondary | ICD-10-CM | POA: Insufficient documentation

## 2010-12-13 DIAGNOSIS — G8929 Other chronic pain: Secondary | ICD-10-CM | POA: Insufficient documentation

## 2010-12-13 DIAGNOSIS — E039 Hypothyroidism, unspecified: Secondary | ICD-10-CM | POA: Insufficient documentation

## 2010-12-13 DIAGNOSIS — F17200 Nicotine dependence, unspecified, uncomplicated: Secondary | ICD-10-CM | POA: Insufficient documentation

## 2010-12-13 DIAGNOSIS — IMO0002 Reserved for concepts with insufficient information to code with codable children: Secondary | ICD-10-CM | POA: Insufficient documentation

## 2010-12-13 DIAGNOSIS — M79609 Pain in unspecified limb: Secondary | ICD-10-CM | POA: Insufficient documentation

## 2010-12-13 DIAGNOSIS — M1990 Unspecified osteoarthritis, unspecified site: Secondary | ICD-10-CM | POA: Insufficient documentation

## 2010-12-13 DIAGNOSIS — F411 Generalized anxiety disorder: Secondary | ICD-10-CM | POA: Insufficient documentation

## 2010-12-13 DIAGNOSIS — Z79899 Other long term (current) drug therapy: Secondary | ICD-10-CM | POA: Insufficient documentation

## 2010-12-26 ENCOUNTER — Other Ambulatory Visit: Payer: Self-pay | Admitting: Internal Medicine

## 2010-12-26 DIAGNOSIS — R55 Syncope and collapse: Secondary | ICD-10-CM

## 2011-01-15 ENCOUNTER — Emergency Department (HOSPITAL_COMMUNITY)
Admission: EM | Admit: 2011-01-15 | Discharge: 2011-01-15 | Disposition: A | Discharge status: Home / Self Care | Payer: BC Managed Care – PPO | Attending: Emergency Medicine | Admitting: Emergency Medicine

## 2011-01-15 DIAGNOSIS — E039 Hypothyroidism, unspecified: Secondary | ICD-10-CM | POA: Insufficient documentation

## 2011-01-15 DIAGNOSIS — Z79899 Other long term (current) drug therapy: Secondary | ICD-10-CM | POA: Insufficient documentation

## 2011-01-15 DIAGNOSIS — Y840 Cardiac catheterization as the cause of abnormal reaction of the patient, or of later complication, without mention of misadventure at the time of the procedure: Secondary | ICD-10-CM | POA: Insufficient documentation

## 2011-01-15 DIAGNOSIS — IMO0002 Reserved for concepts with insufficient information to code with codable children: Secondary | ICD-10-CM | POA: Insufficient documentation

## 2011-01-15 DIAGNOSIS — G8929 Other chronic pain: Secondary | ICD-10-CM | POA: Insufficient documentation

## 2011-01-15 DIAGNOSIS — M79609 Pain in unspecified limb: Secondary | ICD-10-CM

## 2011-01-15 DIAGNOSIS — M1990 Unspecified osteoarthritis, unspecified site: Secondary | ICD-10-CM | POA: Insufficient documentation

## 2011-01-15 DIAGNOSIS — Z7901 Long term (current) use of anticoagulants: Secondary | ICD-10-CM | POA: Insufficient documentation

## 2011-01-15 DIAGNOSIS — S5010XA Contusion of unspecified forearm, initial encounter: Secondary | ICD-10-CM | POA: Insufficient documentation

## 2011-01-15 DIAGNOSIS — M7989 Other specified soft tissue disorders: Secondary | ICD-10-CM | POA: Insufficient documentation

## 2011-01-15 LAB — PROTIME-INR
INR: 2.36 — ABNORMAL HIGH (ref 0.00–1.49)
Prothrombin Time: 26.2 seconds — ABNORMAL HIGH (ref 11.6–15.2)

## 2011-01-15 LAB — APTT: aPTT: 37 seconds (ref 24–37)

## 2011-01-22 ENCOUNTER — Other Ambulatory Visit: Payer: BC Managed Care – PPO

## 2011-02-02 ENCOUNTER — Emergency Department (HOSPITAL_COMMUNITY)
Admission: EM | Admit: 2011-02-02 | Discharge: 2011-02-02 | Disposition: A | Discharge status: Home / Self Care | Payer: Medicare Other | Attending: Emergency Medicine | Admitting: Emergency Medicine

## 2011-02-02 DIAGNOSIS — R110 Nausea: Secondary | ICD-10-CM | POA: Insufficient documentation

## 2011-02-02 DIAGNOSIS — F17200 Nicotine dependence, unspecified, uncomplicated: Secondary | ICD-10-CM | POA: Insufficient documentation

## 2011-02-02 DIAGNOSIS — Z79899 Other long term (current) drug therapy: Secondary | ICD-10-CM | POA: Insufficient documentation

## 2011-02-02 DIAGNOSIS — M1990 Unspecified osteoarthritis, unspecified site: Secondary | ICD-10-CM | POA: Insufficient documentation

## 2011-02-02 DIAGNOSIS — R5381 Other malaise: Secondary | ICD-10-CM | POA: Insufficient documentation

## 2011-02-02 DIAGNOSIS — R05 Cough: Secondary | ICD-10-CM | POA: Insufficient documentation

## 2011-02-02 DIAGNOSIS — R059 Cough, unspecified: Secondary | ICD-10-CM | POA: Insufficient documentation

## 2011-02-02 DIAGNOSIS — R5383 Other fatigue: Secondary | ICD-10-CM | POA: Insufficient documentation

## 2011-02-02 DIAGNOSIS — B9789 Other viral agents as the cause of diseases classified elsewhere: Secondary | ICD-10-CM | POA: Insufficient documentation

## 2011-02-02 DIAGNOSIS — R51 Headache: Secondary | ICD-10-CM | POA: Insufficient documentation

## 2011-02-02 DIAGNOSIS — E039 Hypothyroidism, unspecified: Secondary | ICD-10-CM | POA: Insufficient documentation

## 2011-02-02 LAB — URINALYSIS, ROUTINE W REFLEX MICROSCOPIC
Bilirubin Urine: NEGATIVE
Glucose, UA: NEGATIVE mg/dL
Hgb urine dipstick: NEGATIVE
Ketones, ur: NEGATIVE mg/dL
Leukocytes, UA: NEGATIVE
Nitrite: NEGATIVE
Protein, ur: NEGATIVE mg/dL
Specific Gravity, Urine: 1.014 (ref 1.005–1.030)
Urobilinogen, UA: 0.2 mg/dL (ref 0.0–1.0)
pH: 6 (ref 5.0–8.0)

## 2011-02-02 LAB — RAPID URINE DRUG SCREEN, HOSP PERFORMED
Amphetamines: NOT DETECTED
Barbiturates: NOT DETECTED
Benzodiazepines: POSITIVE — AB
Cocaine: NOT DETECTED
Opiates: NOT DETECTED
Tetrahydrocannabinol: NOT DETECTED

## 2011-02-02 LAB — POCT I-STAT, CHEM 8
Calcium, Ion: 1.14 mmol/L (ref 1.12–1.32)
Chloride: 103 mEq/L (ref 96–112)
Glucose, Bld: 88 mg/dL (ref 70–99)
HCT: 36 % (ref 36.0–46.0)
TCO2: 28 mmol/L (ref 0–100)

## 2011-02-02 LAB — ETHANOL: Alcohol, Ethyl (B): <11 mg/dL (ref 0–11)

## 2011-02-02 LAB — POCT PREGNANCY, URINE: Preg Test, Ur: NEGATIVE

## 2011-02-04 ENCOUNTER — Emergency Department (HOSPITAL_COMMUNITY): Payer: BC Managed Care – PPO

## 2011-02-04 ENCOUNTER — Emergency Department (HOSPITAL_COMMUNITY)
Admission: EM | Admit: 2011-02-04 | Discharge: 2011-02-04 | Disposition: A | Discharge status: Home / Self Care | Payer: BC Managed Care – PPO | Attending: Emergency Medicine | Admitting: Emergency Medicine

## 2011-02-04 DIAGNOSIS — IMO0002 Reserved for concepts with insufficient information to code with codable children: Secondary | ICD-10-CM | POA: Insufficient documentation

## 2011-02-04 DIAGNOSIS — Z7982 Long term (current) use of aspirin: Secondary | ICD-10-CM | POA: Insufficient documentation

## 2011-02-04 DIAGNOSIS — M545 Low back pain, unspecified: Secondary | ICD-10-CM | POA: Insufficient documentation

## 2011-02-04 DIAGNOSIS — F411 Generalized anxiety disorder: Secondary | ICD-10-CM | POA: Insufficient documentation

## 2011-02-04 DIAGNOSIS — E669 Obesity, unspecified: Secondary | ICD-10-CM | POA: Insufficient documentation

## 2011-02-04 DIAGNOSIS — E039 Hypothyroidism, unspecified: Secondary | ICD-10-CM | POA: Insufficient documentation

## 2011-02-04 DIAGNOSIS — M1990 Unspecified osteoarthritis, unspecified site: Secondary | ICD-10-CM | POA: Insufficient documentation

## 2011-02-04 DIAGNOSIS — Z79899 Other long term (current) drug therapy: Secondary | ICD-10-CM | POA: Insufficient documentation

## 2011-02-04 LAB — URINALYSIS, ROUTINE W REFLEX MICROSCOPIC
Glucose, UA: NEGATIVE mg/dL
Ketones, ur: NEGATIVE mg/dL
Leukocytes, UA: NEGATIVE
Nitrite: NEGATIVE
Protein, ur: NEGATIVE mg/dL
pH: 5.5 (ref 5.0–8.0)

## 2011-02-05 ENCOUNTER — Emergency Department (HOSPITAL_COMMUNITY)
Admission: EM | Admit: 2011-02-05 | Discharge: 2011-02-05 | Discharge status: Against Medical Advice | Payer: Medicare Other | Attending: Emergency Medicine | Admitting: Emergency Medicine

## 2011-02-05 DIAGNOSIS — F17200 Nicotine dependence, unspecified, uncomplicated: Secondary | ICD-10-CM | POA: Insufficient documentation

## 2011-02-05 DIAGNOSIS — M545 Low back pain, unspecified: Secondary | ICD-10-CM | POA: Insufficient documentation

## 2011-02-05 DIAGNOSIS — I76 Septic arterial embolism: Secondary | ICD-10-CM | POA: Insufficient documentation

## 2011-02-05 DIAGNOSIS — E669 Obesity, unspecified: Secondary | ICD-10-CM | POA: Insufficient documentation

## 2011-02-05 DIAGNOSIS — M79609 Pain in unspecified limb: Secondary | ICD-10-CM | POA: Insufficient documentation

## 2011-02-25 ENCOUNTER — Other Ambulatory Visit (HOSPITAL_COMMUNITY): Payer: Medicare Other | Attending: Psychiatry | Admitting: Psychiatry

## 2011-02-25 ENCOUNTER — Ambulatory Visit (HOSPITAL_COMMUNITY)
Admission: RE | Admit: 2011-02-25 | Discharge: 2011-02-25 | Disposition: A | Discharge status: Home / Self Care | Payer: Medicare Other | Attending: Psychiatry | Admitting: Psychiatry

## 2011-02-25 DIAGNOSIS — E039 Hypothyroidism, unspecified: Secondary | ICD-10-CM | POA: Insufficient documentation

## 2011-02-25 DIAGNOSIS — G8929 Other chronic pain: Secondary | ICD-10-CM | POA: Insufficient documentation

## 2011-02-25 DIAGNOSIS — G43909 Migraine, unspecified, not intractable, without status migrainosus: Secondary | ICD-10-CM | POA: Insufficient documentation

## 2011-02-25 DIAGNOSIS — Z818 Family history of other mental and behavioral disorders: Secondary | ICD-10-CM | POA: Insufficient documentation

## 2011-02-25 DIAGNOSIS — Z880 Allergy status to penicillin: Secondary | ICD-10-CM | POA: Insufficient documentation

## 2011-02-25 DIAGNOSIS — F1120 Opioid dependence, uncomplicated: Secondary | ICD-10-CM | POA: Insufficient documentation

## 2011-02-25 DIAGNOSIS — E669 Obesity, unspecified: Secondary | ICD-10-CM | POA: Insufficient documentation

## 2011-02-25 DIAGNOSIS — M549 Dorsalgia, unspecified: Secondary | ICD-10-CM | POA: Insufficient documentation

## 2011-02-27 ENCOUNTER — Other Ambulatory Visit (INDEPENDENT_AMBULATORY_CARE_PROVIDER_SITE_OTHER): Payer: Medicare Other | Admitting: Psychiatry

## 2011-02-27 DIAGNOSIS — F1920 Other psychoactive substance dependence, uncomplicated: Secondary | ICD-10-CM

## 2011-03-02 ENCOUNTER — Other Ambulatory Visit (HOSPITAL_COMMUNITY): Payer: Medicare Other | Admitting: Psychiatry

## 2011-03-04 ENCOUNTER — Other Ambulatory Visit (HOSPITAL_COMMUNITY): Payer: Self-pay | Admitting: Psychiatry

## 2011-03-04 ENCOUNTER — Other Ambulatory Visit (HOSPITAL_COMMUNITY): Payer: Medicare Other | Admitting: Psychiatry

## 2011-03-05 LAB — URINE DRUGS OF ABUSE SCREEN W ALC, ROUTINE (REF LAB)
Amphetamine Screen, Ur: NEGATIVE
Cocaine Metabolites: NEGATIVE
Creatinine,U: 356.7 mg/dL
Ethyl Alcohol: <10 mg/dL (ref <10)
Phencyclidine (PCP): POSITIVE — AB

## 2011-03-06 ENCOUNTER — Other Ambulatory Visit (HOSPITAL_COMMUNITY): Payer: Medicare Other | Admitting: Psychiatry

## 2011-03-09 ENCOUNTER — Other Ambulatory Visit (HOSPITAL_COMMUNITY): Payer: Medicare Other | Admitting: Psychiatry

## 2011-03-11 ENCOUNTER — Other Ambulatory Visit (HOSPITAL_COMMUNITY): Payer: Medicare Other | Admitting: Psychiatry

## 2011-03-13 ENCOUNTER — Other Ambulatory Visit (HOSPITAL_COMMUNITY): Payer: Medicare Other | Admitting: Psychiatry

## 2011-03-13 LAB — PHENCYCLIDINE (PCP), URINE, CONFIRMATION: PCP Quant, Ur: NEGATIVE

## 2011-03-13 LAB — BENZODIAZEPINE, QUANTITATIVE, URINE: Lorazepam UR QT: NEGATIVE NG/ML

## 2011-03-15 ENCOUNTER — Emergency Department (HOSPITAL_COMMUNITY)
Admission: EM | Admit: 2011-03-15 | Discharge: 2011-03-15 | Disposition: A | Discharge status: Home / Self Care | Payer: Medicare Other | Attending: Emergency Medicine | Admitting: Emergency Medicine

## 2011-03-15 DIAGNOSIS — F17200 Nicotine dependence, unspecified, uncomplicated: Secondary | ICD-10-CM | POA: Insufficient documentation

## 2011-03-15 DIAGNOSIS — R569 Unspecified convulsions: Secondary | ICD-10-CM | POA: Insufficient documentation

## 2011-03-15 DIAGNOSIS — E039 Hypothyroidism, unspecified: Secondary | ICD-10-CM | POA: Insufficient documentation

## 2011-03-15 DIAGNOSIS — F411 Generalized anxiety disorder: Secondary | ICD-10-CM | POA: Insufficient documentation

## 2011-03-15 DIAGNOSIS — IMO0002 Reserved for concepts with insufficient information to code with codable children: Secondary | ICD-10-CM | POA: Insufficient documentation

## 2011-03-15 DIAGNOSIS — Z79899 Other long term (current) drug therapy: Secondary | ICD-10-CM | POA: Insufficient documentation

## 2011-03-15 DIAGNOSIS — R51 Headache: Secondary | ICD-10-CM | POA: Insufficient documentation

## 2011-03-15 LAB — BASIC METABOLIC PANEL
CO2: 26 mEq/L (ref 19–32)
Chloride: 104 mEq/L (ref 96–112)
Glucose, Bld: 90 mg/dL (ref 70–99)
Potassium: 4 mEq/L (ref 3.5–5.1)
Sodium: 138 mEq/L (ref 135–145)

## 2011-03-16 ENCOUNTER — Other Ambulatory Visit (HOSPITAL_COMMUNITY): Payer: Medicare Other | Admitting: Psychiatry

## 2011-03-18 ENCOUNTER — Other Ambulatory Visit (HOSPITAL_COMMUNITY): Payer: Medicare Other | Admitting: Psychiatry

## 2011-03-20 ENCOUNTER — Other Ambulatory Visit (HOSPITAL_COMMUNITY): Payer: Medicare Other | Admitting: Psychiatry

## 2011-03-23 ENCOUNTER — Other Ambulatory Visit (HOSPITAL_COMMUNITY): Payer: Medicare Other | Admitting: Psychiatry

## 2011-03-25 ENCOUNTER — Other Ambulatory Visit (HOSPITAL_COMMUNITY): Payer: Medicare Other

## 2011-03-27 ENCOUNTER — Other Ambulatory Visit (HOSPITAL_COMMUNITY): Payer: Medicare Other

## 2011-04-01 ENCOUNTER — Other Ambulatory Visit (HOSPITAL_COMMUNITY): Payer: Self-pay | Admitting: Psychiatry

## 2011-04-01 ENCOUNTER — Other Ambulatory Visit (HOSPITAL_COMMUNITY): Payer: Medicare Other | Attending: Psychiatry

## 2011-04-01 DIAGNOSIS — M549 Dorsalgia, unspecified: Secondary | ICD-10-CM | POA: Insufficient documentation

## 2011-04-01 DIAGNOSIS — F1120 Opioid dependence, uncomplicated: Secondary | ICD-10-CM | POA: Insufficient documentation

## 2011-04-01 DIAGNOSIS — G8929 Other chronic pain: Secondary | ICD-10-CM | POA: Insufficient documentation

## 2011-04-01 DIAGNOSIS — Z818 Family history of other mental and behavioral disorders: Secondary | ICD-10-CM | POA: Insufficient documentation

## 2011-04-01 DIAGNOSIS — E669 Obesity, unspecified: Secondary | ICD-10-CM | POA: Insufficient documentation

## 2011-04-01 DIAGNOSIS — Z880 Allergy status to penicillin: Secondary | ICD-10-CM | POA: Insufficient documentation

## 2011-04-01 DIAGNOSIS — E039 Hypothyroidism, unspecified: Secondary | ICD-10-CM | POA: Insufficient documentation

## 2011-04-01 DIAGNOSIS — G43909 Migraine, unspecified, not intractable, without status migrainosus: Secondary | ICD-10-CM | POA: Insufficient documentation

## 2011-04-03 ENCOUNTER — Other Ambulatory Visit (HOSPITAL_COMMUNITY): Payer: Medicare Other

## 2011-04-03 LAB — URINE DRUGS OF ABUSE SCREEN W ALC, ROUTINE (REF LAB)
Amphetamine Screen, Ur: POSITIVE — AB
Barbiturate Quant, Ur: NEGATIVE
Marijuana Metabolite: NEGATIVE
Methadone: NEGATIVE
Opiate Screen, Urine: NEGATIVE
Propoxyphene: NEGATIVE

## 2011-04-06 ENCOUNTER — Other Ambulatory Visit (HOSPITAL_COMMUNITY): Payer: Medicare Other

## 2011-04-08 ENCOUNTER — Other Ambulatory Visit (HOSPITAL_COMMUNITY): Payer: Medicare Other

## 2011-04-08 LAB — BENZODIAZEPINE, QUANTITATIVE, URINE: Nordiazepam GC/MS Conf: NEGATIVE NG/ML

## 2011-04-08 LAB — AMPHETAMINES URINE CONFIRMATION
Amphetamines: NEGATIVE NG/ML
Methamphetamine GC/MS, Ur: NEGATIVE NG/ML
Methylenedioxyamphetamine: NEGATIVE NG/ML
Methylenedioxyethylamphetamine: NEGATIVE NG/ML
Methylenedioxymethamphetamine: NEGATIVE NG/ML

## 2011-04-10 ENCOUNTER — Other Ambulatory Visit (HOSPITAL_COMMUNITY): Payer: Medicare Other

## 2011-04-13 ENCOUNTER — Other Ambulatory Visit (HOSPITAL_COMMUNITY): Payer: Medicare Other

## 2011-04-15 ENCOUNTER — Other Ambulatory Visit (HOSPITAL_COMMUNITY): Payer: Medicare Other

## 2011-04-16 LAB — URINALYSIS, ROUTINE W REFLEX MICROSCOPIC
Glucose, UA: NEGATIVE
Protein, ur: NEGATIVE
Specific Gravity, Urine: >1.03 — ABNORMAL HIGH

## 2011-04-17 ENCOUNTER — Other Ambulatory Visit (HOSPITAL_COMMUNITY): Payer: Medicare Other

## 2011-04-17 LAB — CBC
HCT: 35.1 — ABNORMAL LOW
HCT: 36.6
Hemoglobin: 12.7
MCV: 92.5
Platelets: 169
RBC: 3.03 — ABNORMAL LOW
RBC: 3.96
WBC: 10.5
WBC: 13.7 — ABNORMAL HIGH
WBC: 7.5

## 2011-04-17 LAB — BASIC METABOLIC PANEL
GFR calc non Af Amer: >60
Glucose, Bld: 102 — ABNORMAL HIGH
Potassium: 3.8
Sodium: 130 — ABNORMAL LOW

## 2011-04-17 LAB — RPR: RPR Ser Ql: NONREACTIVE

## 2011-04-17 LAB — RAPID STREP SCREEN (MED CTR MEBANE ONLY): Streptococcus, Group A Screen (Direct): NEGATIVE

## 2011-04-20 ENCOUNTER — Other Ambulatory Visit (HOSPITAL_COMMUNITY): Payer: Medicare Other

## 2011-04-22 ENCOUNTER — Other Ambulatory Visit (HOSPITAL_COMMUNITY): Payer: Medicare Other

## 2011-04-24 ENCOUNTER — Other Ambulatory Visit (HOSPITAL_COMMUNITY): Payer: Medicare Other

## 2011-04-27 ENCOUNTER — Other Ambulatory Visit (HOSPITAL_COMMUNITY): Payer: Medicare Other

## 2011-04-28 LAB — OCCULT BLOOD X 1 CARD TO LAB, STOOL: Fecal Occult Bld: NEGATIVE

## 2011-04-29 ENCOUNTER — Other Ambulatory Visit (HOSPITAL_COMMUNITY): Payer: Medicare Other

## 2011-09-27 ENCOUNTER — Encounter (HOSPITAL_COMMUNITY): Payer: Self-pay | Admitting: *Deleted

## 2011-09-27 ENCOUNTER — Emergency Department (HOSPITAL_COMMUNITY)
Admission: EM | Admit: 2011-09-27 | Discharge: 2011-09-27 | Disposition: A | Discharge status: Home / Self Care | Payer: Medicare Other | Attending: Emergency Medicine | Admitting: Emergency Medicine

## 2011-09-27 DIAGNOSIS — L0201 Cutaneous abscess of face: Secondary | ICD-10-CM | POA: Insufficient documentation

## 2011-09-27 DIAGNOSIS — L0390 Cellulitis, unspecified: Secondary | ICD-10-CM

## 2011-09-27 DIAGNOSIS — L03211 Cellulitis of face: Secondary | ICD-10-CM | POA: Insufficient documentation

## 2011-09-27 DIAGNOSIS — R51 Headache: Secondary | ICD-10-CM | POA: Insufficient documentation

## 2011-09-27 MED ORDER — TRAMADOL HCL 50 MG PO TABS
50.0000 mg | ORAL_TABLET | Freq: Once | ORAL | Status: AC
  Administered 2011-09-27: 50 mg via ORAL
  Filled 2011-09-27: qty 1

## 2011-09-27 MED ORDER — IBUPROFEN 800 MG PO TABS
800.0000 mg | ORAL_TABLET | Freq: Once | ORAL | Status: AC
  Administered 2011-09-27: 800 mg via ORAL
  Filled 2011-09-27: qty 1

## 2011-09-27 MED ORDER — TRAMADOL HCL 50 MG PO TABS: 50.0000 mg | ORAL_TABLET | Freq: Four times a day (QID) | ORAL | Status: DC | PRN

## 2011-09-27 MED ORDER — TRAMADOL HCL 50 MG PO TABS: 50.0000 mg | ORAL_TABLET | Freq: Four times a day (QID) | ORAL | Status: AC | PRN

## 2011-09-27 MED ORDER — VANCOMYCIN HCL IN DEXTROSE 1-5 GM/200ML-% IV SOLN
1000.0000 mg | Freq: Once | INTRAVENOUS | Status: AC
  Administered 2011-09-27: 1000 mg via INTRAVENOUS
  Filled 2011-09-27: qty 200

## 2011-09-27 NOTE — ED Provider Notes (Signed)
History     CSN: [id number]  Arrival date & time 09/27/11  1509   First MD Initiated Contact with Patient 09/27/11 1553      Chief Complaint  Patient presents with  . Abrasion  . Wound Infection    (Consider location/radiation/quality/duration/timing/severity/associated sxs/prior treatment) HPI Patient presents to the emergency room with complaints of a skin lesion/pimple on her chin for the last 3 days. She's noticed a small amount of drainage and today developed increased redness and pain. She's also noticed some swelling that has extended somewhat below her chin. Patient denies any difficulty with swallowing or breathing. The pain is sharp and is 6/10. It increases with palpation. Patient incidentally has seen a dermatologist for skin infections in her axillary region. Her dermatologist called in Septra and clindamycin yesterday. She has not been able to fill that to. 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 She had been in a hospital on antibiotics in the past for this. History reviewed. No pertinent past medical history.  History reviewed. No pertinent past surgical history.  History reviewed. No pertinent family history.  History  Substance Use Topics  . Smoking status: Current Everyday Smoker  . Smokeless tobacco: Not on file  . Alcohol Use: No    OB History    Grav Para Term Preterm Abortions TAB SAB Ect Mult Living                  Review of Systems  All other systems reviewed and are negative.    Allergies  Penicillins and Quinolones  Home Medications   Current Outpatient Rx  Name Route Sig Dispense Refill  . BACLOFEN 20 MG PO TABS Oral Take 20 mg by mouth 3 (three) times daily.    Marland Kitchen BUPRENORPHINE 10 MCG/HR TD PTWK Transdermal Place 1 patch onto the skin once a week. On Wednesdays    . CALCIUM 1000 + D PO Oral Take 2 tablets by mouth every morning.    Marland Kitchen VITAMIN D PO Oral Take 1 tablet by mouth daily.    Marland Kitchen LEVOTHYROXINE SODIUM 50 MCG PO  TABS Oral Take 50 mcg by mouth daily.    . ADULT MULTIVITAMIN W/MINERALS CH Oral Take 1 tablet by mouth daily.    . OMEGA 3 PO Oral Take 1 capsule by mouth daily.    Marland Kitchen PAROXETINE HCL 30 MG PO TABS Oral Take 15 mg by mouth every morning.    Marland Kitchen PREGABALIN 150 MG PO CAPS Oral Take 150 mg by mouth 4 (four) times daily.      BP 138/83  Pulse 112  Temp(Src) 98.8 F (37.1 C) (Oral)  Resp 18  SpO2 97%  LMP 09/20/2011  Physical Exam  Nursing note and vitals reviewed. Constitutional: She appears well-developed and well-nourished. No distress.  HENT:  Head: Normocephalic and atraumatic.  Right Ear: External ear normal.  Left Ear: External ear normal.       Small papule with serous drainage on the right aspect of the chin, induration and erythema surrounding that, no fluctuance, no trismus,  Eyes: Conjunctivae are normal. Right eye exhibits no discharge. Left eye exhibits no discharge. No scleral icterus.  Neck: Neck supple. No tracheal deviation present.  Cardiovascular: Normal rate.   Pulmonary/Chest: Effort normal. No stridor. No respiratory distress.  Musculoskeletal: She exhibits no edema.  Neurological: She is alert. Cranial nerve deficit: no gross deficits.  Skin: Skin is warm and dry. No rash noted.  Psychiatric: She has a normal mood and  affect.    ED Course  Procedures (including critical care time)  Labs Reviewed - No data to display No results found.     MDM  The patient appears to have a cellulitis around an infected papule on her chin. On exam there is no fluctuance to suggest a deep space abscess. Patient does not appear toxic. She does not have a fever. She was given a dose of vancomycin in the emergency department. She has prescriptions for clindamycin and Bactrim that she has not started yet. Patient was provided pain medications in the emergency department but did not want any narcotics as she has a history of opiate abuse.        Celene Kras, MD 09/27/11  [phone]

## 2011-09-27 NOTE — ED Notes (Signed)
She has  History of  Mersa..  She has had a skin lesion to her chin for 2-3 days.  Redness and more pain today

## 2011-09-27 NOTE — Discharge Instructions (Signed)
Cellulitis Cellulitis is an infection of the skin and the tissue beneath it. The area is typically red and tender. It is caused by germs (bacteria) (usually staph or strep) that enter the body through cuts or sores. Cellulitis most commonly occurs in the arms or lower legs.  HOME CARE INSTRUCTIONS   If you are given a prescription for medications which kill germs (antibiotics), take as directed until finished.   If the infection is on the arm or leg, keep the limb elevated as able.   Use a warm cloth several times per Revuelta to relieve pain and encourage healing.   See your caregiver for recheck of the infected site as directed if problems arise.   Only take over-the-counter or prescription medicines for pain, discomfort, or fever as directed by your caregiver.  SEEK MEDICAL CARE IF:   The area of redness (inflammation) is spreading, there are red streaks coming from the infected site, or if a part of the infection begins to turn dark in color.   The joint or bone underneath the infected skin becomes painful after the skin has healed.   The infection returns in the same or another area after it seems to have gone away.   A boil or bump swells up. This may be an abscess.   New, unexplained problems such as pain or fever develop.  SEEK IMMEDIATE MEDICAL CARE IF:   You have a fever.   You or your child feels drowsy or lethargic.   There is vomiting, diarrhea, or lasting discomfort or feeling ill (malaise) with muscle aches and pains.  MAKE SURE YOU:   Understand these instructions.   Will watch your condition.   Will get help right away if you are not doing well or get worse.  Document Released: 04/22/2005 Document Revised: 07/02/2011 Document Reviewed: 02/29/2008 [facility] Patient Information 2012 [place], [place].Community-Associated MRSA CA-MRSA stands for community-associated methicillin-resistant Staphylococcus aureus. MRSA is a type of bacteria that is resistant to some common  antibiotics. It can cause infections in the skin and many other places in the body. Staphylococcus aureus, often called "staph," is a bacteria that normally lives on the skin or in the nose. Staph on the surface of the skin or in the nose does not cause problems. However, if the staph enters the body through a cut, wound, or break in the skin, an infection can happen. Up until recently, infections with the MRSA type of staph mainly occurred in hospitals and other healthcare settings. There are now increasing problems with MRSA infections in the community as well. Infections with MRSA may be very serious or even life-threatening. CA-MRSA is becoming more common. It is known to spread in crowded settings, in jails and prisons, and in situations where there is close skin-to-skin contact, such as during sporting events or in locker rooms. MRSA can be spread through shared items, such as children's toys, razors, towels, or sports equipment.  CAUSES All staph, including MRSA, are normally harmless unless they enter the body through a scratch, cut, or wound, such as with surgery. All staph, including MRSA, can be spread from person-to-person by touching contaminated objects or through direct contact.  MRSA now causes illness in people who have not been in hospitals or other healthcare facilities. Cases of MRSA diseases in the community have been associated with:   Recent antibiotic use.   Sharing contaminated towels or clothes.   Having active skin diseases.   Participating in contact sports.   Living in crowded settings.  Intravenous (IV) drug use.   Community-associated MRSA infections are usually skin infections, but may cause other severe illnesses.   Staph bacteria are one of the most common causes of skin infection. However, they are also a common cause of pneumonia, bone or joint infections, and bloodstream infections.  DIAGNOSIS Diagnosis of MRSA is done by cultures of fluid samples that  may come from:  Swabs taken from cuts or wounds in infected areas.   Nasal swabs.   Saliva or deep cough specimens from the lungs (sputum).   Urine.   Blood.  Many people are "colonized" with MRSA but have no signs of infection. This means that people carry the MRSA germ on their skin or in their nose and may never develop MRSA infection.  TREATMENT  Treatment varies and is based on how serious, how deep, or how extensive the infection is. For example:  Some skin infections, such as a small boil or abscess, may be treated by draining yellowish-white fluid (pus) from the site of the infection.   Deeper or more widespread soft tissue infections are usually treated with surgery to drain pus and with antibiotic medicine given by vein or by mouth. This may be recommended even if you are pregnant.   Serious infections may require a hospital stay.  If antibiotics are given, they may be needed for several weeks. PREVENTION Because many people are colonized with staph, including MRSA, preventing the spread of the bacteria from person-to-person is most important. The best way to prevent the spread of bacteria and other germs is through proper hand washing or by using alcohol-based hand disinfectants. The following are other ways to help prevent MRSA infection within community settings.   Wash your hands frequently with soap and water for at least 15 seconds. Otherwise, use alcohol-based hand disinfectants when soap and water is not available.   Make sure people who live with you wash their hands often, too.   Do not share personal items. For example, avoid sharing razors and other personal hygiene items, towels, clothing, and athletic equipment.   Wash and dry your clothes and bedding at the warmest temperatures recommended on the labels.   Keep wounds covered. Pus from infected sores may contain MRSA and other bacteria. Keep cuts and abrasions clean and covered with germ-free (sterile), dry  bandages until they are healed.   If you have a wound that appears infected, ask your caregiver if a culture for MRSA and other bacteria should be done.   If you are breastfeeding, talk to your caregiver about MRSA. You may be asked to temporarily stop breastfeeding.  HOME CARE INSTRUCTIONS   Take your antibiotics as directed. Finish them even if you start to feel better.   Avoid close contact with those around you as much as possible. Do not use towels, razors, toothbrushes, bedding, or other items that will be used by others.   To fight the infection, follow your caregiver's instructions for wound care. Wash your hands before and after changing your bandages.   If you have an intravascular device, such as a catheter, make sure you know how to care for it.   Be sure to tell any healthcare providers that you have MRSA so they are aware of your infection.  SEEK IMMEDIATE MEDICAL CARE IF:  The infection appears to be getting worse. Signs include:   Increased warmth, redness, or tenderness around the wound site.   A red line that extends from the infection site.   A  dark color in the area around the infection.   Wound drainage that is tan, yellow, or green.   A bad smell coming from the wound.   You feel sick to your stomach (nauseous) and throw up (vomit) or cannot keep medicine down.   You have a fever.   Your baby is older than 3 months with a rectal temperature of 102 F (38.9 C) or higher.   Your baby is 49 months old or younger with a rectal temperature of 100.4 F (38 C) or higher.   You have difficulty breathing.  MAKE SURE YOU:   Understand these instructions.   Will watch your condition.   Will get help right away if you are not doing well or get worse.  Document Released: 10/16/2005 Document Revised: 07/02/2011 Document Reviewed: 10/16/2010 [facility] Patient Information 2012 [place], [place].

## 2011-10-05 ENCOUNTER — Other Ambulatory Visit: Payer: Self-pay | Admitting: Registered Nurse

## 2011-10-05 ENCOUNTER — Other Ambulatory Visit (HOSPITAL_COMMUNITY)
Admission: RE | Admit: 2011-10-05 | Discharge: 2011-10-05 | Disposition: A | Discharge status: Home / Self Care | Payer: Medicare Other | Source: Ambulatory Visit | Attending: Endocrinology | Admitting: Endocrinology

## 2011-10-05 DIAGNOSIS — Z124 Encounter for screening for malignant neoplasm of cervix: Secondary | ICD-10-CM | POA: Insufficient documentation

## 2011-10-27 ENCOUNTER — Encounter (HOSPITAL_COMMUNITY): Payer: Self-pay | Admitting: Emergency Medicine

## 2011-10-27 ENCOUNTER — Emergency Department (HOSPITAL_COMMUNITY)
Admission: EM | Admit: 2011-10-27 | Discharge: 2011-10-27 | Disposition: A | Discharge status: Home / Self Care | Payer: Medicare Other | Attending: Emergency Medicine | Admitting: Emergency Medicine

## 2011-10-27 DIAGNOSIS — Z881 Allergy status to other antibiotic agents status: Secondary | ICD-10-CM | POA: Insufficient documentation

## 2011-10-27 DIAGNOSIS — G8929 Other chronic pain: Secondary | ICD-10-CM | POA: Insufficient documentation

## 2011-10-27 DIAGNOSIS — Z79899 Other long term (current) drug therapy: Secondary | ICD-10-CM | POA: Insufficient documentation

## 2011-10-27 DIAGNOSIS — M549 Dorsalgia, unspecified: Secondary | ICD-10-CM

## 2011-10-27 DIAGNOSIS — F17200 Nicotine dependence, unspecified, uncomplicated: Secondary | ICD-10-CM | POA: Insufficient documentation

## 2011-10-27 DIAGNOSIS — E079 Disorder of thyroid, unspecified: Secondary | ICD-10-CM | POA: Insufficient documentation

## 2011-10-27 DIAGNOSIS — F1111 Opioid abuse, in remission: Secondary | ICD-10-CM | POA: Insufficient documentation

## 2011-10-27 DIAGNOSIS — Z880 Allergy status to penicillin: Secondary | ICD-10-CM | POA: Insufficient documentation

## 2011-10-27 HISTORY — DX: Dorsalgia, unspecified: M54.9

## 2011-10-27 HISTORY — DX: Disorder of thyroid, unspecified: E07.9

## 2011-10-27 HISTORY — DX: Other chronic pain: G89.29

## 2011-10-27 MED ORDER — TRAMADOL HCL 50 MG PO TABS: 50.0000 mg | ORAL_TABLET | Freq: Four times a day (QID) | ORAL | Status: DC | PRN

## 2011-10-27 MED ORDER — PREDNISONE (PAK) 10 MG PO TABS: ORAL_TABLET | ORAL | Status: DC

## 2011-10-27 MED ORDER — TIZANIDINE HCL 4 MG PO CAPS: 4.0000 mg | ORAL_CAPSULE | Freq: Three times a day (TID) | ORAL | Status: DC

## 2011-10-27 NOTE — ED Provider Notes (Signed)
History     CSN: [id number]  Arrival date & time 10/27/11  0830   First MD Initiated Contact with Patient 10/27/11 0857    9:37 AM   HPI Patient reports yesterday was lifting heavy objects and now has pain that radiates down her left lower extremity. Reports pain is typical for chronic back pain. Reports several back surgeries over the years.. Reports begins across her entire lower back and sends sharp shooting pains down her left lower extremity. Denies saddle anesthesias, perineal numbness, tingling, incontinence, weakness, vomiting, urinary symptoms, fever.  Patient is a 36 y.o. female presenting with back pain. The history is provided by the patient.  Back Pain  This is a chronic problem. The current episode started yesterday. The problem occurs constantly. The problem has not changed since onset.The pain is associated with lifting heavy objects. The pain is present in the lumbar spine. The quality of the pain is described as stabbing and shooting. The pain radiates to the left thigh. The pain is moderate. The symptoms are aggravated by bending. Pertinent negatives include no chest pain, no fever, no numbness, no headaches, no abdominal pain, no bowel incontinence, no perianal numbness, no dysuria, no pelvic pain, no leg pain, no paresthesias, no paresis, no tingling and no weakness. She has tried NSAIDs for the symptoms. The treatment provided no relief. Risk factors include obesity.    Past Medical History  Diagnosis Date  . Narcotic abuse in remission   . Thyroid disease   . Chronic back pain     History reviewed. No pertinent past surgical history.  History reviewed. No pertinent family history.  History  Substance Use Topics  . Smoking status: Current Everyday Smoker  . Smokeless tobacco: Not on file  . Alcohol Use: No    OB History    Grav Para Term Preterm Abortions TAB SAB Ect Mult Living                  Review of Systems  Constitutional: Negative for fever and  chills.  HENT: Negative for neck pain and neck stiffness.   Cardiovascular: Negative for chest pain.  Gastrointestinal: Negative for abdominal pain and bowel incontinence.  Genitourinary: Negative for dysuria, urgency, frequency, hematuria, flank pain and pelvic pain.  Musculoskeletal: Positive for back pain.       Denies saddle anesthesias, or perineal numbness, urinary or bowel incontinence  Neurological: Negative for tingling, weakness, numbness, headaches and paresthesias.  All other systems reviewed and are negative.    Allergies  Avelox; Levaquin; Penicillins; and Quinolones  Home Medications   Current Outpatient Rx  Name Route Sig Dispense Refill  . CALCIUM 1000 + D PO Oral Take 2 tablets by mouth every morning.    Marland Kitchen VITAMIN D PO Oral Take 1 tablet by mouth daily.    Marland Kitchen LEVOTHYROXINE SODIUM 50 MCG PO TABS Oral Take 50 mcg by mouth daily.    . ADULT MULTIVITAMIN W/MINERALS CH Oral Take 1 tablet by mouth daily.    . OMEGA 3 PO Oral Take 1 capsule by mouth daily.    Marland Kitchen PAROXETINE HCL 30 MG PO TABS Oral Take 15 mg by mouth every morning.    Marland Kitchen PREGABALIN 150 MG PO CAPS Oral Take 150 mg by mouth 4 (four) times daily.      BP 158/114  Pulse 114  Temp(Src) 99.1 F (37.3 C) (Oral)  Resp 18  SpO2 96%  Physical Exam  Vitals reviewed. Constitutional: She is oriented to person, place,  and time. Vital signs are normal. She appears well-developed and well-nourished.  HENT:  Head: Normocephalic and atraumatic.  Eyes: Conjunctivae are normal. Pupils are equal, round, and reactive to light.  Neck: Normal range of motion. Neck supple.  Cardiovascular: Normal rate, regular rhythm and normal heart sounds.  Exam reveals no friction rub.   No murmur heard. Pulmonary/Chest: Effort normal and breath sounds normal. She has no wheezes. She has no rhonchi. She has no rales. She exhibits no tenderness.  Musculoskeletal: Normal range of motion.       Lumbar back: She exhibits tenderness, pain  and spasm. She exhibits normal range of motion, no swelling, no deformity and normal pulse.       Back:       Bilateral lower extremity: Negative SLR. Normal pulses. Normal sensation.  Neurological: She is alert and oriented to person, place, and time. Coordination normal.  Skin: Skin is warm and dry. No rash noted. No erythema. No pallor.    ED Course  Procedures  MDM  Treat pain with tramadol, prednisone, Zanaflex. We'll also provide followup to Dr. Noel Gerold in orthopedic physician. Patient agrees to plan and is ready for discharge.        Thomasene Lot, PA-C 10/27/11 [phone]

## 2011-10-27 NOTE — ED Notes (Signed)
Pt here for lower back pain with radiation down left leg after moving things last night; pt sts hx of narcotic addiction and chronic back pain with back sx

## 2011-10-27 NOTE — Discharge Instructions (Signed)
Back Exercises   Back exercises help treat and prevent back injuries. The goal of back exercises is to increase the strength of your abdominal and back muscles and the flexibility of your back. These exercises should be started when you no longer have back pain. Back exercises include:   Pelvic Tilt. Lie on your back with your knees bent. Tilt your pelvis until the lower part of your back is against the floor. Hold this position 5 to 10 sec and repeat 5 to 10 times.   Knee to Chest. Pull first 1 knee up against your chest and hold for 20 to 30 seconds, repeat this with the other knee, and then both knees. This may be done with the other leg straight or bent, whichever feels better.   Sit-Ups or Curl-Ups. Bend your knees 90 degrees. Start with tilting your pelvis, and do a partial, slow sit-up, lifting your trunk only 30 to 45 degrees off the floor. Take at least 2 to 3 seconds for each sit-up. Do not do sit-ups with your knees out straight. If partial sit-ups are difficult, simply do the above but with only tightening your abdominal muscles and holding it as directed.   Hip-Lift. Lie on your back with your knees flexed 90 degrees. Push down with your feet and shoulders as you raise your hips a couple inches off the floor; hold for 10 seconds, repeat 5 to 10 times.   Back arches. Lie on your stomach, propping yourself up on bent elbows. Slowly press on your hands, causing an arch in your low back. Repeat 3 to 5 times. Any initial stiffness and discomfort should lessen with repetition over time.   Shoulder-Lifts. Lie face down with arms beside your body. Keep hips and torso pressed to floor as you slowly lift your head and shoulders off the floor.   Do not overdo your exercises, especially in the beginning. Exercises may cause you some mild back discomfort which lasts for a few minutes; however, if the pain is more severe, or lasts for more than 15 minutes, do not continue exercises until you see your caregiver.  Improvement with exercise therapy for back problems is slow.   See your caregivers for assistance with developing a proper back exercise program.   Document Released: 08/20/2004 Document Revised: 07/02/2011 Document Reviewed: 07/13/2005   ExitCare® Patient Information ©2012 [employer].     Back Pain, Adult   Low back pain is very common. About 1 in 5 people have back pain. The cause of low back pain is rarely dangerous. The pain often gets better over time. About half of people with a sudden onset of back pain feel better in just 2 weeks. About 8 in 10 people feel better by 6 weeks.   CAUSES   Some common causes of back pain include:   Strain of the muscles or ligaments supporting the spine.   Wear and tear (degeneration) of the spinal discs.   Arthritis.   Direct injury to the back.   DIAGNOSIS   Most of the time, the direct cause of low back pain is not known. However, back pain can be treated effectively even when the exact cause of the pain is unknown. Answering your caregiver's questions about your overall health and symptoms is one of the most accurate ways to make sure the cause of your pain is not dangerous. If your caregiver needs more information, he or she may order lab work or imaging tests (X-rays or MRIs). However, even   if imaging tests show changes in your back, this usually does not require surgery.   HOME CARE INSTRUCTIONS   For many people, back pain returns. Since low back pain is rarely dangerous, it is often a condition that people can learn to manage on their own.   Remain active. It is stressful on the back to sit or stand in one place. Do not sit, drive, or stand in one place for more than 30 minutes at a time. Take short walks on level surfaces as soon as pain allows. Try to increase the length of time you walk each Munyon.   Do not stay in bed. Resting more than 1 or 2 days can delay your recovery.   Do not avoid exercise or work. Your body is made to move. It is not dangerous to be active,  even though your back may hurt. Your back will likely heal faster if you return to being active before your pain is gone.   Pay attention to your body when you bend and lift. Many people have less discomfort when lifting if they bend their knees, keep the load close to their bodies, and avoid twisting. Often, the most comfortable positions are those that put less stress on your recovering back.   Find a comfortable position to sleep. Use a firm mattress and lie on your side with your knees slightly bent. If you lie on your back, put a pillow under your knees.   Only take over-the-counter or prescription medicines as directed by your caregiver. Over-the-counter medicines to reduce pain and inflammation are often the most helpful. Your caregiver may prescribe muscle relaxant drugs. These medicines help dull your pain so you can more quickly return to your normal activities and healthy exercise.   Put ice on the injured area.   Put ice in a plastic bag.   Place a towel between your skin and the bag.   Leave the ice on for 15 to 20 minutes, 3 to 4 times a Ruppert for the first 2 to 3 days. After that, ice and heat may be alternated to reduce pain and spasms.   Ask your caregiver about trying back exercises and gentle massage. This may be of some benefit.   Avoid feeling anxious or stressed. Stress increases muscle tension and can worsen back pain. It is important to recognize when you are anxious or stressed and learn ways to manage it. Exercise is a great option.   SEEK MEDICAL CARE IF:   You have pain that is not relieved with rest or medicine.   You have pain that does not improve in 1 week.   You have new symptoms.   You are generally not feeling well.   SEEK IMMEDIATE MEDICAL CARE IF:   You have pain that radiates from your back into your legs.   You develop new bowel or bladder control problems.   You have unusual weakness or numbness in your arms or legs.   You develop nausea or vomiting.   You develop abdominal  pain.   You feel faint.   Document Released: 07/13/2005 Document Revised: 07/02/2011 Document Reviewed: 12/01/2010   ExitCare® Patient Information ©2012 [employer].

## 2011-10-27 NOTE — ED Provider Notes (Signed)
Medical screening examination/treatment/procedure(s) were performed by non-physician practitioner and as supervising physician I was immediately available for consultation/collaboration.  Toy Baker, MD 10/27/11 [phone]

## 2011-10-27 NOTE — ED Notes (Signed)
Pt presents with onset of low back pain after picking up a vacuum yesterday.  Pt reports h/o back surgeries x 4, pt is recovering addict x 1 year.  Pt reports pain radiates down L leg, denies any urinary or fecal incontinence.  Pt reports she can take tramadol, zanaflex and "prednisone will probably be good right now".   Pt is GCS 15 and in no acute distress.

## 2011-10-30 ENCOUNTER — Ambulatory Visit (INDEPENDENT_AMBULATORY_CARE_PROVIDER_SITE_OTHER): Payer: Medicare Other | Admitting: Internal Medicine

## 2011-10-30 VITALS — BP 150/93 | HR 111 | Temp 99.5°F | Resp 18 | Ht 66.0 in | Wt 303.2 lb

## 2011-10-30 DIAGNOSIS — F39 Unspecified mood [affective] disorder: Secondary | ICD-10-CM

## 2011-10-30 DIAGNOSIS — G4733 Obstructive sleep apnea (adult) (pediatric): Secondary | ICD-10-CM

## 2011-10-30 DIAGNOSIS — R030 Elevated blood-pressure reading, without diagnosis of hypertension: Secondary | ICD-10-CM

## 2011-10-30 DIAGNOSIS — G8929 Other chronic pain: Secondary | ICD-10-CM

## 2011-10-30 DIAGNOSIS — M549 Dorsalgia, unspecified: Secondary | ICD-10-CM

## 2011-10-30 DIAGNOSIS — E039 Hypothyroidism, unspecified: Secondary | ICD-10-CM

## 2011-10-30 DIAGNOSIS — F1110 Opioid abuse, uncomplicated: Secondary | ICD-10-CM

## 2011-10-30 MED ORDER — LEVOTHYROXINE SODIUM 50 MCG PO TABS: 50.0000 ug | ORAL_TABLET | Freq: Every day | ORAL | Status: DC

## 2011-10-30 MED ORDER — PREGABALIN 100 MG PO CAPS: ORAL_CAPSULE | ORAL | Status: DC

## 2011-10-30 MED ORDER — AZITHROMYCIN 500 MG PO TABS: 500.0000 mg | ORAL_TABLET | Freq: Every day | ORAL | Status: AC

## 2011-10-30 MED ORDER — TIZANIDINE HCL 4 MG PO CAPS: 4.0000 mg | ORAL_CAPSULE | Freq: Three times a day (TID) | ORAL | Status: DC | PRN

## 2011-10-30 MED ORDER — TRAMADOL HCL 50 MG PO TABS: 100.0000 mg | ORAL_TABLET | Freq: Four times a day (QID) | ORAL | Status: DC | PRN

## 2011-10-30 MED ORDER — TRAZODONE HCL 100 MG PO TABS: 200.0000 mg | ORAL_TABLET | Freq: Every day | ORAL | Status: DC

## 2011-10-30 MED ORDER — PAROXETINE HCL 20 MG PO TABS: 20.0000 mg | ORAL_TABLET | Freq: Every morning | ORAL | Status: DC

## 2011-10-30 MED ORDER — TIZANIDINE HCL 6 MG PO CAPS: 6.0000 mg | ORAL_CAPSULE | Freq: Three times a day (TID) | ORAL | Status: DC

## 2011-10-31 DIAGNOSIS — G4733 Obstructive sleep apnea (adult) (pediatric): Secondary | ICD-10-CM | POA: Insufficient documentation

## 2011-10-31 DIAGNOSIS — E039 Hypothyroidism, unspecified: Secondary | ICD-10-CM | POA: Insufficient documentation

## 2011-10-31 DIAGNOSIS — F39 Unspecified mood [affective] disorder: Secondary | ICD-10-CM | POA: Insufficient documentation

## 2011-10-31 NOTE — Progress Notes (Signed)
  Subjective:    Patient ID: [name], female    DOB: 07-30-1975, 36 y.o.   MRN: [id number]  HPIIs here for followup post prolonged residential treatment for opiate abuse She started her treatment at the [facility]  And did very well/she was then transferred to a long-term facility to continue her work in [employer] of the expense, and her parents were paying for this, she was unable to complete her program and came home with some treatment still in progress. In particular, she is on the Butrans patch at 10mg -has 2 weeks left before dropping to 5mg  weekly for the final month of withdrawal She was continued on tramadol and Zanaflex and Lyrica for her chronic back pain, but hopes to get off tramadol eventually. For her psychiatric disorder she was treated with Paxil, Lyrica, and trazodone at bedtime. They continued her thyroid medication and she needs a refill of that. 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 She is living with her parentsAnd in the process of divorcing her husband. She and her husband share custody of their daughter Julious Oka She is about to begin an aggressive weight loss effort  She has been home a little more than one month, and over the last 2 weeks has developed increasing sinus pressure with nasal congestion. She now has purulent discharge and nocturnal cough, but no fever.  Review of SystemsShe is on treatment for obstructive sleep apnea/during her hospitalization she had a pulse ox in the 70s and she would awaken at night when she was not being treated  She has had a variety of psychiatrists and pain management people in the past but currently feels like it is time to start over. She had painless thyroiditis in 2007 which led to her current need for thyroxine During her opiate abuse phase she had significant chest  pain that led to a hospitalization and catheterization in May of 2012 which was of course normal /She developed a DVT during hospitalization and did not adhere to her treatment afterwards, but got well.      Objective:   Physical Exam She is morbidly morbidly obese but in no acute distress TMs are clear/nares are boggy with purulent discharge/there is purulent discharge in the posterior pharynx but tonsils are normal There are no anterior cervical nodes Chest is clear to auscultation She has bilateral maxillary tenderness to percussion       Assessment & Plan:  Problem #1 acute sinusitis She'll be treated with Zithromax 500 daily for 5 days since she is allergic to penicillins  Problem #2 chronic back pain . Medication refills/Will avoid narcotics/hopefully her dose of tramadol can be reduced on her own volition/we will establish followup care with orthopedics for more appropriate interventional treatment  Problem #3-Opiate abuse/stable at this point She will be referred to a treatment center that can complete her withdrawal from Butrans-and help coordinate future treatment/Paxil Lyrica and trazodone were refilled  Problem #4 hypothyroidism Synthroid 50 mcg refilled and follow up labs will be done in 6 months  Problem #5 morbid obesity Problem #6 increased blood pressure without diagnosis of hypertension Her first step here needs to be aggressive weight loss

## 2011-11-02 ENCOUNTER — Telehealth: Payer: Self-pay

## 2011-11-02 NOTE — Telephone Encounter (Signed)
Tramadol only comes in 50 mg 100 mg tablets/the maximum dose is 400 mg a Bartz.Marland Kitchenat 2 tablets 4 times a Veach she has a 2 week supply with one refill/Then we can consider changing her to the next size up if she still needs 4 tablets a Texidor She should take 2 Zanaflex 4 mg tablets instead of one/This will last 2 weeks and she can decide she really wants to change or she wants to appeal to her insurance company  to cover The higher dose Tell her that her butrans medication can be managed by [facility] and she is to call [phone] to set up an appointment/she can speak with anyone however the director is Dr. Ardelle Lesches -I have sent them her most recent records although I'm still waiting for her outside records

## 2011-11-02 NOTE — Telephone Encounter (Signed)
PT STATES THE ZANAFLEX 4MG S ISN'T WORKING AND HER INSURANCE COMPANY WON'T AUTHORIZED ANY MGS HIGHER. WOULD LIKE TO HAVE FLEXERIL INSTEAD ALSO DR DOOLITTLE HAD PUT THE TRAMADOL FOR PRN, BUT SHE TAKES IT 4 TIMES A Broadwell AND IT IS THE 250MG S. PLEASE CALL [phone]  [facility] [address]

## 2011-11-03 NOTE — Telephone Encounter (Signed)
Spoke with pt. She stated that she has been taking two 50 mg Tramadol QID (not 250 mg as message says). She is not sure the pharm will RF this in two weeks bc of the way Rx was written, but she would rather go ahead and switch to 100 mg tablets, 1 QID not prn since that is what she has been taking. Pt also states she had discussed with you that she could take up to 8 mg of the Zanaflex and she has already tried taking that amount and it is still not helping. She requests change to Flexeril now. Pt agrees to call [facility]. Pt is aware that Dr Merla Riches is not back in office until tomorrow.

## 2011-11-03 NOTE — Telephone Encounter (Signed)
LMOM to CB. 

## 2011-11-04 MED ORDER — CYCLOBENZAPRINE HCL 10 MG PO TABS: 10.0000 mg | ORAL_TABLET | Freq: Three times a day (TID) | ORAL | Status: DC | PRN

## 2011-11-04 MED ORDER — FLUCONAZOLE 150 MG PO TABS: 150.0000 mg | ORAL_TABLET | Freq: Once | ORAL | Status: AC

## 2011-11-04 NOTE — Telephone Encounter (Signed)
PT NOTIFIED  

## 2011-11-04 NOTE — Telephone Encounter (Signed)
Pt called back again and LM that she is having Sxs of thrush since taking the Z-pack. Would you like to Rx Diflucan and /or anything else for pt?

## 2011-11-04 NOTE — Telephone Encounter (Signed)
Okay for Diflucan which I will enter Flexeril also Tramadol cannot be changed until the current prescription is over-she has a 2 week supply with a refill and the pharmacist  will refill this as written/when this prescription is over she can followup and I'll write it differently the next time

## 2011-11-15 ENCOUNTER — Telehealth: Payer: Self-pay

## 2011-11-15 MED ORDER — FLUCONAZOLE 150 MG PO TABS: 150.0000 mg | ORAL_TABLET | Freq: Every day | ORAL | Status: AC

## 2011-11-15 NOTE — Telephone Encounter (Signed)
Called pt advised RX sent to pharmacy

## 2011-11-15 NOTE — Telephone Encounter (Signed)
Pt of Dr Merla Riches would like him to know she still has thrush really bad and would like a rx for diflucan for several days not just for one Ramnauth.

## 2011-11-15 NOTE — Telephone Encounter (Signed)
Please advise patient that diflucan has been sent in (#3)

## 2011-11-29 ENCOUNTER — Telehealth: Payer: Self-pay

## 2011-11-29 NOTE — Telephone Encounter (Signed)
Pt would like for Dr Merla Riches to change her lyrica back to 150mg  4x a Ehinger.

## 2011-12-01 NOTE — Telephone Encounter (Signed)
Spoke with patient gave patient message patient understands 

## 2011-12-01 NOTE — Telephone Encounter (Signed)
The dosing on Lyrica is every 8-12 hours, NOT every 6 hours

## 2011-12-05 ENCOUNTER — Other Ambulatory Visit: Payer: Self-pay | Admitting: Internal Medicine

## 2011-12-06 NOTE — Telephone Encounter (Signed)
Pt needs refill on Flexeril. Pt stated totally out today. [phone]

## 2011-12-07 ENCOUNTER — Other Ambulatory Visit: Payer: Self-pay

## 2011-12-07 NOTE — Telephone Encounter (Signed)
PT CONCERNED RX FOR FLEXERAL (???) WAS DENIED AND NEEDS TO KNOW WHAT TO DO. VERY DISTRAUGHT. [phone]

## 2011-12-08 NOTE — Telephone Encounter (Signed)
LMOM THAT RX WAS SENT INTO PHARMACY 

## 2011-12-18 ENCOUNTER — Other Ambulatory Visit: Payer: Self-pay | Admitting: Internal Medicine

## 2011-12-18 ENCOUNTER — Other Ambulatory Visit: Payer: Self-pay

## 2011-12-18 NOTE — Telephone Encounter (Signed)
Pt is requesting a change in pregabalin (LYRICA) 100 MG capsule Wants to decrease to 75mg  a Havey still four times a Kahl  Also wants the tramadol

## 2011-12-18 NOTE — Telephone Encounter (Signed)
Pt states she is currently taking Lyrica 100 mg, and would like to reduce to 75 mg bc she is getting too drowsy. Also Dr Merla Riches had talked about switching her to the ext release Tramadol so that she can just take it once a Searls instead of having to take so many pills so many times a Hou. Pt would like to try the ER Tramadol as discussed.

## 2011-12-19 ENCOUNTER — Ambulatory Visit (INDEPENDENT_AMBULATORY_CARE_PROVIDER_SITE_OTHER): Payer: Medicare Other | Admitting: Family Medicine

## 2011-12-19 VITALS — BP 129/96 | HR 94 | Temp 98.5°F | Resp 16 | Ht 66.25 in | Wt 298.0 lb

## 2011-12-19 DIAGNOSIS — F411 Generalized anxiety disorder: Secondary | ICD-10-CM

## 2011-12-19 DIAGNOSIS — F419 Anxiety disorder, unspecified: Secondary | ICD-10-CM

## 2011-12-19 DIAGNOSIS — F1120 Opioid dependence, uncomplicated: Secondary | ICD-10-CM

## 2011-12-19 DIAGNOSIS — G8929 Other chronic pain: Secondary | ICD-10-CM

## 2011-12-19 MED ORDER — TIZANIDINE HCL 4 MG PO CAPS: ORAL_CAPSULE | ORAL | Status: DC

## 2011-12-19 MED ORDER — TRAMADOL HCL ER 200 MG PO TB24: 200.0000 mg | ORAL_TABLET | Freq: Every day | ORAL | Status: DC

## 2011-12-19 MED ORDER — PREGABALIN 75 MG PO CAPS: ORAL_CAPSULE | ORAL | Status: DC

## 2011-12-19 NOTE — Progress Notes (Signed)
  Subjective:    Patient ID: [name], female    DOB: 1976/06/24, 36 y.o.   MRN: [id number]  HPI 36 yo female here for med refills.  Wants to adjust meds.   Recent notes visits - in process of opiate addiction treatment.  On suboxone.   Has been doing well.  Working out in  [place] this morning and having some headache and slight anxiety.  Otherwise doing well.   Tramadol now - 50mg , 2 tabs q 6.  Wants to go to XR formula for once a Wotton. Doesn't like so many pills all Maclay.  Lyrica now - 100mg  caps, 2 in AM, 1 at lunch, dinner, and bedtime.  Feels like this makes her too sleepy, esp since being on suboxone.   Also needs refill on zanaflex.  Takes 1-2 TID prn.    Review of Systems Negative except as per HPI     Objective:   Physical Exam  Constitutional: She appears well-developed and well-nourished.  Cardiovascular: Normal rate, regular rhythm, normal heart sounds and intact distal pulses.   No murmur heard. Pulmonary/Chest: Effort normal and breath sounds normal.  Neurological: She is alert.  Skin: Skin is warm and dry.          Assessment & Plan:  Patient followed closely by Dr. Merla Riches, who is out of town.  Since med changes are decreases rather than increases, told her we could try them temporarily and I would give her enough for 10 days to last until she can get back in to see Dr. Merla Riches when he returns.  Lyrica 75 2 in AM, 1 at lunch, dinner, hs Tramadol ER 200 qd Zanaflex 4 1-2 TID prn

## 2011-12-19 NOTE — Telephone Encounter (Signed)
Pt states that she would like to speak with someone regarding her Lyrica Rx because Dr Merla Riches is out for the week and she really needs to speak with someone

## 2011-12-19 NOTE — Telephone Encounter (Signed)
Pt came in to be seen today 

## 2011-12-20 ENCOUNTER — Other Ambulatory Visit: Payer: Self-pay

## 2011-12-20 NOTE — Telephone Encounter (Signed)
Pt has questions about dosage of meds given by Dr Georgiana Shore. Pt wants Dr Merla Riches or  Dr Cleta Alberts to call her back . [phone]

## 2011-12-22 MED ORDER — TRAMADOL HCL 50 MG PO TABS
ORAL_TABLET | ORAL | Status: DC
Start: 2011-12-22 — End: 2012-05-27

## 2011-12-22 MED ORDER — TRAMADOL HCL ER 200 MG PO TB24: 200.0000 mg | ORAL_TABLET | Freq: Two times a day (BID) | ORAL | Status: AC

## 2011-12-22 MED ORDER — TRAMADOL HCL ER 200 MG PO TB24: 200.0000 mg | ORAL_TABLET | Freq: Two times a day (BID) | ORAL | Status: DC

## 2011-12-22 NOTE — Telephone Encounter (Signed)
Pt CB and stated she is really sorry and hates to call back, but when she went to p/up her Tramadol ER Rx it was much too expensive and she is afraid she will have to go back to the original Rx of the immediate release 50 mg that she was taking 8 pills a Kendrick so that she can afford it. Requests that we cancel the ER Rx and [facility] in [facility] reg [facility].

## 2011-12-22 NOTE — Telephone Encounter (Signed)
Called pt to let her know we are sending in corrected Rx. Pt asked them to be sent to [facility] bc she has another Rx to p/up there.

## 2011-12-22 NOTE — Telephone Encounter (Signed)
Yes that is fine.  Can send in rx with that sig but only#14, no refills.

## 2011-12-22 NOTE — Telephone Encounter (Signed)
Changed Rx to [facility]. Cancelled at [employer].

## 2011-12-22 NOTE — Telephone Encounter (Signed)
Addended by: Launa Flight B on: 12/22/2011 01:05 PM   Modules accepted: Orders

## 2011-12-22 NOTE — Telephone Encounter (Signed)
I have sent it

## 2011-12-22 NOTE — Telephone Encounter (Signed)
Spoke w/pt who had been in to see Dr Georgiana Shore on Sat to readjust her meds. Pt wanted to change to ER Tramadol so that she wouldn't have to take 8 pills a Tinch. Unfortunately, the new Rx was written for Tramadol ER 200 mg 1 tab QD, and pt has been on 400 mg daily of the reg release. Can this be corrected to 1 BID so that she will still be on the total of 400 mg QD? If not corrected she will run out bf Dr Merla Riches returns.

## 2011-12-22 NOTE — Telephone Encounter (Signed)
Addended by: Lamar Laundry on: 12/22/2011 02:07 PM   Modules accepted: Orders

## 2011-12-23 NOTE — Telephone Encounter (Signed)
Pt called back to check status of Lyrica Rx. Told her that Dr Cleta Alberts told me that he will make the change for her, but he has been with patients and hasn't had the chance to send it yet. Pt thanked [place] and asked for it to be sent to the [facility] on [address].

## 2011-12-23 NOTE — Telephone Encounter (Signed)
Pt CB and I told her the Rx for the Tramadol had been sent in. Pt thanked [place] and said she is really sorry but she is in so much pain right now d/t reduced dosage of Lyrica. Pt had asked to dec strength of Lyrica at 5/25 OV, and states that she will be here to see Dr Merla Riches on Sunday when he returns. She again apologized but said she just can not stand the pain she is in and requests to go back to the 100 Lyrica she was on previously. Pt doesn't want to take more of the 75 mg that she has because that would be too much and she would also run out bf Dr Merla Riches returns. Can we please change this back ASAP so that she can p/up quickly?

## 2011-12-23 NOTE — Telephone Encounter (Signed)
Britta Mccreedy please change [facility] prescription. She can take Lyrica 75 mg she can take 2 in the morning one at noon one in the evening and 2 at bedtime. This will put her at a maximum dosage of 600 mg a Wisinski. Please give her enough medication to last her until she sees Dr. Merla Riches this weekend.

## 2011-12-24 NOTE — Telephone Encounter (Signed)
LMOM to CB. 

## 2011-12-25 ENCOUNTER — Telehealth: Payer: Self-pay

## 2011-12-25 NOTE — Telephone Encounter (Signed)
Pt CB and LM on VM that everything is now straightened out w/her Lyrica and Tramadol and she thanked [place]

## 2011-12-25 NOTE — Telephone Encounter (Signed)
Would like to let Dr Merla Riches know that everything has been straighten out with tramadol and lyrica and Dr Georgiana Shore gave her 10 days of zanaflex insurance is not wanting to cover rx so she would like to switch it back to flexeral

## 2011-12-25 NOTE — Telephone Encounter (Signed)
LMOM to CB. 

## 2011-12-27 ENCOUNTER — Other Ambulatory Visit: Payer: Self-pay | Admitting: *Deleted

## 2011-12-27 NOTE — Telephone Encounter (Signed)
PT WOULD LIKE TO KNOW IF SHE CAN GET A REFILL ON CYCLOBENZAPRINE. SHE SAID HER INS WOULD NOT PAY FOR ZANAFLEX.

## 2011-12-28 MED ORDER — CYCLOBENZAPRINE HCL 10 MG PO TABS: 10.0000 mg | ORAL_TABLET | Freq: Three times a day (TID) | ORAL | Status: DC | PRN

## 2011-12-28 NOTE — Telephone Encounter (Signed)
Sending [facility], and [facility] for pt that done.

## 2011-12-28 NOTE — Telephone Encounter (Signed)
Okay to change back to Flexeril 10 mg 3 times a Manges #90 As written by Nancie Neas PAC on 12/07/11-see meds and orders

## 2011-12-28 NOTE — Telephone Encounter (Signed)
Addended by: Launa Flight B on: 12/28/2011 05:27 PM   Modules accepted: Orders

## 2012-01-11 ENCOUNTER — Telehealth: Payer: Self-pay

## 2012-01-11 NOTE — Telephone Encounter (Signed)
Let her know that we cannot use soma anymore/in patients with a history of opiate addiction this drug may lead to Addiction, tolerance, and psychological dependence It would be possible to use Robaxin for Skelaxin if she would like to

## 2012-01-11 NOTE — Telephone Encounter (Signed)
PT WANTED DR DOOLITTLE TO KNOW THAT THE FLEXERIL ISN'T WORKING AND HER INSURANCE WON'T PAY FOR THE XANAFLEX. NEED TO TRY SOMETHING DIFFERENT THE SOMA WORKS GOOD AND PT WANTED TO MAKE SURE THE DR KNOW SHE IS HOUSE-SITTING PLEASE CALL [phone]   [facility] [address]

## 2012-01-12 NOTE — Telephone Encounter (Signed)
PT SAYS SHE WOULD LIKE TO TRY ROBAXIN. SHE ALSO SAID TO TELL YOU SHE CELEBRATED 9 MONTHS OF BEING CLEAN ON June 17TH.

## 2012-01-13 MED ORDER — METHOCARBAMOL 750 MG PO TABS: 750.0000 mg | ORAL_TABLET | Freq: Three times a day (TID) | ORAL | Status: AC

## 2012-01-13 NOTE — Telephone Encounter (Signed)
ok 

## 2012-01-13 NOTE — Telephone Encounter (Signed)
LMOM notifying patient rx sent in. 

## 2012-01-19 ENCOUNTER — Other Ambulatory Visit: Payer: Self-pay | Admitting: Physician Assistant

## 2012-02-07 ENCOUNTER — Other Ambulatory Visit: Payer: Self-pay | Admitting: Physician Assistant

## 2012-02-07 ENCOUNTER — Telehealth: Payer: Self-pay

## 2012-02-07 NOTE — Telephone Encounter (Signed)
Patient requesting refill of Flexaril. States it hasn't been filled since May, but pulled her back and can not come in. [facility] on [address]. #[phone]

## 2012-02-07 NOTE — Telephone Encounter (Signed)
DR DAUB, PATIENT WANTED TO CALL AND SAY THANK YOU FOR FILLING HER MEDICINE SO FAST.  ALSO WANTS YOU TO KNOW SHE WILL BE CELEBRATING A YEAR (OF BEING CLEAN I GUESS).

## 2012-02-09 NOTE — Telephone Encounter (Signed)
Rx already refilled and patient notified already.  Disregard.

## 2012-02-15 ENCOUNTER — Telehealth: Payer: Self-pay

## 2012-02-15 MED ORDER — PREGABALIN 100 MG PO CAPS: ORAL_CAPSULE | ORAL | Status: DC

## 2012-02-15 NOTE — Telephone Encounter (Signed)
PATIENT'S LYRICA ARE WRONG ON THE BOTTLE.  SHE TAKES 5 A Mcanany, BUT IS SAYS 4.  TAKES 2 IN MORNING, 1 AT LUNCH, 1 AT DINNER AND 1 AT BED.  SHE IS OUT.  PLEASE CALL [phone]

## 2012-02-15 NOTE — Telephone Encounter (Signed)
When she refills her last prescription they made an error at the pharmacy and left off her afternoon dose She uses 5 tablets a Kuhrt She was changed to 75 mg by Dr. Georgiana Shore due to nausea Now wants to use 100 mg which she refills her own around 01/19/2012 from a prior prescription but with the erroneous instructions so she ran out of medicine to early  Meds ordered this encounter  Medications  . pregabalin (LYRICA) 100 MG capsule    Sig: 2 in AM, 1 noon, 1 evening, 1 hs= 5 tabs a Feasel    Dispense:  150 capsule    Refill:  1

## 2012-02-15 NOTE — Telephone Encounter (Signed)
Called pharm to check when pt last had Lyrica filled and for what # and directions. Reported that Dr Merla Riches had written for 100 mg capsules two Qam, 1 Qnoon, 1 Qhs. And they filled #150 on 6/24 which should have been a 37 Favila supply unless sig changed. Pt states she takes 5 capsules a Rayner, which she did when on 75 mg in May. Dr Merla Riches do you want her taking 5 /[place]? She would need new Rx for this.

## 2012-03-03 ENCOUNTER — Telehealth: Payer: Self-pay

## 2012-03-03 NOTE — Telephone Encounter (Signed)
PT REQUESTING DECREASE IN MILLIGRAMS FROM 100 TO 75 ON HER LYRICA,  PHARM[facility]Y [facility] [address]   BEST PHONE FOR PT [phone]

## 2012-03-04 MED ORDER — PREGABALIN 75 MG PO CAPS: ORAL_CAPSULE | ORAL | Status: DC

## 2012-03-04 NOTE — Telephone Encounter (Signed)
Rx faxed into pharmacy and patient notified.

## 2012-03-04 NOTE — Telephone Encounter (Signed)
At TL desk 

## 2012-03-09 ENCOUNTER — Other Ambulatory Visit: Payer: Self-pay | Admitting: Physician Assistant

## 2012-03-10 ENCOUNTER — Telehealth: Payer: Self-pay

## 2012-03-10 NOTE — Telephone Encounter (Signed)
The patient called to request rx for Ambien.  The patient is requesting that Dr. Cleta Alberts write a week's worth of Ambien for her due to having had a death in the family and being sick, she has not slept, and is trying to care for her 38 year old child.  The patient stated that she is a recovering substance abuser, but that this medication was NOT one that she abused, and Dr. Cleta Alberts has written it for her previously.  Please call the patient at [phone].

## 2012-03-10 NOTE — Telephone Encounter (Signed)
Dr Cleta Alberts, do you want to do this?

## 2012-03-10 NOTE — Telephone Encounter (Signed)
PT CALLED BACK REGARDING AMBIEN RX. IS HOPING SHE CAN GET SOMETHING FOR TONIGHT AS SHE CANNOT SLEEP. I TOLD HER IT WOULD LIKELY BE TOMORROW UNTIL SHE HEARS FROM [place].  BEST [phone]

## 2012-03-10 NOTE — Telephone Encounter (Signed)
Is currently being reviewed, will call her.

## 2012-03-11 ENCOUNTER — Telehealth: Payer: Self-pay | Admitting: Radiology

## 2012-03-11 MED ORDER — ZOLPIDEM TARTRATE 5 MG PO TABS: 5.0000 mg | ORAL_TABLET | Freq: Every evening | ORAL | Status: DC | PRN

## 2012-03-11 NOTE — Telephone Encounter (Signed)
Okay to call in Ambien 5 mg one by mouth each bedtime #7 with no refills

## 2012-03-11 NOTE — Telephone Encounter (Signed)
Dr Cleta Alberts did authorize her Ambien for #7 this is sent in for her and she is advised.

## 2012-03-12 ENCOUNTER — Telehealth: Payer: Self-pay

## 2012-03-12 NOTE — Telephone Encounter (Signed)
PATIENT STATES HER PRESCRIPTION WAS WRITTEN WRONG. THE AMBIEN WAS WRITTEN FOR 5MG  AND SHE GETS 10MG . ALSO, THE QUANITY WAS WRITTEN FOR #7 AND SHE NEEDS IT FOR 1 MONTH. BEST PHONE [phone] (CELL)  PHARMACY CHOICE IS [facility] ([address])  MBC

## 2012-03-13 MED ORDER — ZOLPIDEM TARTRATE 5 MG PO TABS: 5.0000 mg | ORAL_TABLET | Freq: Every evening | ORAL | Status: DC | PRN

## 2012-03-13 NOTE — Telephone Encounter (Signed)
Dr Cleta Alberts wrote for 5 mg as this is the new recommendation from FDA. No longer 10mg 

## 2012-03-13 NOTE — Telephone Encounter (Signed)
Pt was told to double up but was told that the FDA recommends 5mg s.  She would like a 15 Curto supply so she can get some rest.

## 2012-03-13 NOTE — Telephone Encounter (Signed)
Per Dr. Merla Riches send in 2weeks supply and to RTC if she is still having trouble sleeping and she must only take 1 tablet not 2

## 2012-03-14 ENCOUNTER — Telehealth: Payer: Self-pay

## 2012-03-14 NOTE — Telephone Encounter (Signed)
Pt states she called pharmacy yesterday and the [facility] before and they stated they have not received her rx for ambien.    Pharmacy: [facility] [address]  Best; [phone]  bf

## 2012-03-14 NOTE — Telephone Encounter (Signed)
Patient is calling about rx for ambien. She says that she was told it would be at the pharmacy and they have not received it yet. She states she has been speaking specifically to [facility] and is concerned that others may not be aware of the situation. Please contact at [phone].

## 2012-03-14 NOTE — Telephone Encounter (Signed)
Not to be filled until tomorrow. Pharmacy has the order for it and patient knows she can get it.

## 2012-03-14 NOTE — Telephone Encounter (Signed)
Rx written on 8/18 for #14, called pharmacy to verify they received rx, and they did not.  I went ahead and called in rx from 8/18 to them.  Pharmacy did mention that patient received #7 on 8/16, and should not need refill until 8/23--Our rx states ok to fill 8/20.  Will discuss this with PA and call pharmacy back...  Looks like patient had been doubling up on rx per last note-- Dr. Merla Riches ok'd 2 week supply and for patient to RTC to discuss.  Will notify pharmacy and contact patient.

## 2012-03-14 NOTE — Telephone Encounter (Signed)
See following two phone messages. This has been completed.

## 2012-04-01 ENCOUNTER — Telehealth: Payer: Self-pay

## 2012-04-01 ENCOUNTER — Other Ambulatory Visit: Payer: Self-pay | Admitting: Internal Medicine

## 2012-04-01 NOTE — Telephone Encounter (Signed)
Refill done.  

## 2012-04-01 NOTE — Telephone Encounter (Signed)
We did receive request and PAs are reviewing.  To provider to let us know when refill complete.

## 2012-04-01 NOTE — Telephone Encounter (Signed)
The patient called to verify that we received the rx refill request from the [facility] on [address].  The Rx is supposed to be for Lyrica 75mg .  Please call the patient to let her know it has been processed at [phone].

## 2012-04-01 NOTE — Telephone Encounter (Signed)
Patient notified and rx faxed. 

## 2012-04-01 NOTE — Telephone Encounter (Signed)
Ok x 1, needs recheck for more? 

## 2012-04-08 ENCOUNTER — Telehealth: Payer: Self-pay

## 2012-04-08 MED ORDER — CYCLOBENZAPRINE HCL 10 MG PO TABS: 10.0000 mg | ORAL_TABLET | Freq: Three times a day (TID) | ORAL | Status: DC | PRN

## 2012-04-08 NOTE — Telephone Encounter (Signed)
  She had a refill of Flexeril #90 on August 15 And so can have another refill Next return visit in one month

## 2012-04-08 NOTE — Addendum Note (Signed)
Addended by: Tonye Pearson on: 04/08/2012 06:15 PM   Modules accepted: Orders

## 2012-04-08 NOTE — Telephone Encounter (Signed)
PATIENT HAS CALLED TODAY AT 04/08/12 IN REGARDS ABOUT PRESCRIPTION REFILL CALLED FLEXARIL? (MIGHT BE SPELT INCORRECTLY) PATIENT REQUESTED THAT DOOLITTLE REFILL PRESCRIPTION AS SOON AS HE CAN. PATIENT HAS RAN OUT AND HAS CALLED THREE TIMES PRIOR. PATIENT HAS SPOKEN TO PHARMACY AND THEY HAVE NOT RECEIVED ANYTHING YET. THANK YOU!

## 2012-04-08 NOTE — Telephone Encounter (Signed)
Needs followup of a visit office visit after this prescription

## 2012-04-09 ENCOUNTER — Other Ambulatory Visit: Payer: Self-pay

## 2012-04-09 MED ORDER — CYCLOBENZAPRINE HCL 10 MG PO TABS: 10.0000 mg | ORAL_TABLET | Freq: Three times a day (TID) | ORAL | Status: DC | PRN

## 2012-04-10 ENCOUNTER — Telehealth: Payer: Self-pay

## 2012-04-10 NOTE — Telephone Encounter (Signed)
Dr. Merla Riches is this ok?

## 2012-04-10 NOTE — Telephone Encounter (Signed)
PATIENT HAS CALLED REQUESTING THAT DR. Merla Riches BE AWARE THAT PATIENT IS A RECOVERING ADDICT AND WOULD ONLY WANT AMBIEN FOR ONE REFILL ONLY. THANK YOU! PLEASE VERIFY THIS WITH PATIENT.

## 2012-04-10 NOTE — Telephone Encounter (Signed)
Last prescription for Ambien was 03/15/2013 for #14 according to chart On 04/15/2013 she could have a prescription for another 14 tablets She should not be treated for more than 14 days in a row with this medication She may use this 14 Carvey supply one more time and if she needs further treatment we should consider other medications

## 2012-04-10 NOTE — Telephone Encounter (Signed)
PT IS REQUESTING REFILLS ON AMBIEN - HAS NOT SLEPT IN THREE DAYS.   [facility] [address]   CBN:  [phone]

## 2012-04-11 MED ORDER — ZOLPIDEM TARTRATE 5 MG PO TABS: ORAL_TABLET | ORAL | Status: DC

## 2012-04-11 NOTE — Telephone Encounter (Signed)
Sent Rx in for her to fill on 9/20 she wants to be able to fill this now, since she has not slept. I explained to her how to take this medication, and your specific instructions. She does not listen to me she requests something she can get now t help he sleep. I told her I will let you know.

## 2012-04-11 NOTE — Telephone Encounter (Signed)
Patient called x4 about her ambien rx, will advise her of this when I call back about the Ambien.

## 2012-04-11 NOTE — Telephone Encounter (Signed)
She should try 50 mg of Benadryl or 3 mg of melatonin If she would like to try something else have her come in for an office visit so we can discuss all the alternatives

## 2012-04-12 NOTE — Telephone Encounter (Signed)
Left message for her to call me back so I can advise.  

## 2012-04-14 NOTE — Telephone Encounter (Signed)
See notes under 04/10/12 phone message.

## 2012-04-15 NOTE — Telephone Encounter (Signed)
Patient has not called me back. She is able to get the Rx from pharmacy today.

## 2012-05-27 ENCOUNTER — Observation Stay (HOSPITAL_COMMUNITY)
Admission: EM | Admit: 2012-05-27 | Discharge: 2012-05-28 | Disposition: A | Discharge status: Home / Self Care | Payer: Medicare Other | Attending: Internal Medicine | Admitting: Internal Medicine

## 2012-05-27 ENCOUNTER — Observation Stay (HOSPITAL_COMMUNITY): Payer: Medicare Other

## 2012-05-27 ENCOUNTER — Emergency Department (HOSPITAL_COMMUNITY): Payer: Medicare Other

## 2012-05-27 ENCOUNTER — Encounter (HOSPITAL_COMMUNITY): Payer: Self-pay | Admitting: *Deleted

## 2012-05-27 DIAGNOSIS — F17200 Nicotine dependence, unspecified, uncomplicated: Secondary | ICD-10-CM | POA: Insufficient documentation

## 2012-05-27 DIAGNOSIS — S92919A Unspecified fracture of unspecified toe(s), initial encounter for closed fracture: Secondary | ICD-10-CM

## 2012-05-27 DIAGNOSIS — M542 Cervicalgia: Secondary | ICD-10-CM | POA: Insufficient documentation

## 2012-05-27 DIAGNOSIS — R6889 Other general symptoms and signs: Secondary | ICD-10-CM

## 2012-05-27 DIAGNOSIS — S91109A Unspecified open wound of unspecified toe(s) without damage to nail, initial encounter: Secondary | ICD-10-CM | POA: Insufficient documentation

## 2012-05-27 DIAGNOSIS — S92909A Unspecified fracture of unspecified foot, initial encounter for closed fracture: Secondary | ICD-10-CM | POA: Diagnosis present

## 2012-05-27 DIAGNOSIS — S91114A Laceration without foreign body of right lesser toe(s) without damage to nail, initial encounter: Secondary | ICD-10-CM

## 2012-05-27 DIAGNOSIS — M79609 Pain in unspecified limb: Secondary | ICD-10-CM | POA: Insufficient documentation

## 2012-05-27 DIAGNOSIS — S82899A Other fracture of unspecified lower leg, initial encounter for closed fracture: Principal | ICD-10-CM | POA: Insufficient documentation

## 2012-05-27 DIAGNOSIS — F1111 Opioid abuse, in remission: Secondary | ICD-10-CM | POA: Insufficient documentation

## 2012-05-27 DIAGNOSIS — S92309A Fracture of unspecified metatarsal bone(s), unspecified foot, initial encounter for closed fracture: Secondary | ICD-10-CM | POA: Insufficient documentation

## 2012-05-27 DIAGNOSIS — Z23 Encounter for immunization: Secondary | ICD-10-CM | POA: Insufficient documentation

## 2012-05-27 DIAGNOSIS — W108XXA Fall (on) (from) other stairs and steps, initial encounter: Secondary | ICD-10-CM | POA: Diagnosis present

## 2012-05-27 DIAGNOSIS — S92324A Nondisplaced fracture of second metatarsal bone, right foot, initial encounter for closed fracture: Secondary | ICD-10-CM

## 2012-05-27 DIAGNOSIS — Z981 Arthrodesis status: Secondary | ICD-10-CM | POA: Insufficient documentation

## 2012-05-27 DIAGNOSIS — Y92009 Unspecified place in unspecified non-institutional (private) residence as the place of occurrence of the external cause: Secondary | ICD-10-CM | POA: Insufficient documentation

## 2012-05-27 DIAGNOSIS — S8252XA Displaced fracture of medial malleolus of left tibia, initial encounter for closed fracture: Secondary | ICD-10-CM

## 2012-05-27 DIAGNOSIS — S92355A Nondisplaced fracture of fifth metatarsal bone, left foot, initial encounter for closed fracture: Secondary | ICD-10-CM

## 2012-05-27 DIAGNOSIS — S8253XA Displaced fracture of medial malleolus of unspecified tibia, initial encounter for closed fracture: Secondary | ICD-10-CM

## 2012-05-27 DIAGNOSIS — S92501A Displaced unspecified fracture of right lesser toe(s), initial encounter for closed fracture: Secondary | ICD-10-CM

## 2012-05-27 HISTORY — DX: Sleep apnea, unspecified: G47.30

## 2012-05-27 HISTORY — DX: Disease of blood and blood-forming organs, unspecified: D75.9

## 2012-05-27 HISTORY — DX: Gastro-esophageal reflux disease without esophagitis: K21.9

## 2012-05-27 HISTORY — DX: Headache: R51

## 2012-05-27 HISTORY — DX: Myoneural disorder, unspecified: G70.9

## 2012-05-27 HISTORY — DX: Hypothyroidism, unspecified: E03.9

## 2012-05-27 HISTORY — DX: Anxiety disorder, unspecified: F41.9

## 2012-05-27 HISTORY — DX: Malignant (primary) neoplasm, unspecified: C80.1

## 2012-05-27 HISTORY — DX: Unspecified osteoarthritis, unspecified site: M19.90

## 2012-05-27 LAB — CBC
HCT: 37.8 % (ref 36.0–46.0)
MCH: 29.5 pg (ref 26.0–34.0)
MCHC: 32.8 g/dL (ref 30.0–36.0)
MCV: 89.8 fL (ref 78.0–100.0)
Platelets: 152 10*3/uL (ref 150–400)
RDW: 14.2 % (ref 11.5–15.5)
WBC: 11.6 10*3/uL — ABNORMAL HIGH (ref 4.0–10.5)

## 2012-05-27 LAB — BASIC METABOLIC PANEL
BUN: 14 mg/dL (ref 6–23)
Calcium: 8.8 mg/dL (ref 8.4–10.5)
Chloride: 104 mEq/L (ref 96–112)
Creatinine, Ser: 0.85 mg/dL (ref 0.50–1.10)
GFR calc Af Amer: >90 mL/min (ref >90)

## 2012-05-27 MED ORDER — CYCLOBENZAPRINE HCL 5 MG PO TABS
5.0000 mg | ORAL_TABLET | Freq: Three times a day (TID) | ORAL | Status: DC
  Administered 2012-05-27: 08:00:00 via ORAL
  Filled 2012-05-27 (×3): qty 1

## 2012-05-27 MED ORDER — DIAZEPAM 5 MG PO TABS
5.0000 mg | ORAL_TABLET | Freq: Three times a day (TID) | ORAL | Status: DC | PRN
  Administered 2012-05-27 – 2012-05-28 (×3): 5 mg via ORAL
  Filled 2012-05-27 (×3): qty 1

## 2012-05-27 MED ORDER — SODIUM CHLORIDE 0.9 % IJ SOLN
3.0000 mL | Freq: Two times a day (BID) | INTRAMUSCULAR | Status: DC
  Administered 2012-05-27 – 2012-05-28 (×2): 3 mL via INTRAVENOUS

## 2012-05-27 MED ORDER — HYDROMORPHONE HCL PF 1 MG/ML IJ SOLN
1.0000 mg | INTRAMUSCULAR | Status: DC | PRN
  Administered 2012-05-27: 1.5 mg via INTRAVENOUS
  Filled 2012-05-27: qty 2

## 2012-05-27 MED ORDER — INFLUENZA VIRUS VACC SPLIT PF IM SUSP
0.5000 mL | INTRAMUSCULAR | Status: AC
  Administered 2012-05-28: 0.5 mL via INTRAMUSCULAR
  Filled 2012-05-27: qty 0.5

## 2012-05-27 MED ORDER — HEPARIN SODIUM (PORCINE) 5000 UNIT/ML IJ SOLN
5000.0000 [IU] | Freq: Three times a day (TID) | INTRAMUSCULAR | Status: DC
  Administered 2012-05-27 – 2012-05-28 (×4): 5000 [IU] via SUBCUTANEOUS
  Filled 2012-05-27 (×6): qty 1

## 2012-05-27 MED ORDER — HYDROMORPHONE HCL PF 1 MG/ML IJ SOLN: 1.0000 mg | INTRAMUSCULAR | Status: DC | PRN

## 2012-05-27 MED ORDER — HYDROMORPHONE HCL PF 1 MG/ML IJ SOLN
1.0000 mg | INTRAMUSCULAR | Status: DC | PRN
  Administered 2012-05-27: 1.5 mg via INTRAVENOUS
  Administered 2012-05-28: 1 mg via INTRAVENOUS
  Filled 2012-05-27: qty 1
  Filled 2012-05-27: qty 2
  Filled 2012-05-27 (×2): qty 1

## 2012-05-27 MED ORDER — KETOROLAC TROMETHAMINE 30 MG/ML IJ SOLN
30.0000 mg | Freq: Three times a day (TID) | INTRAMUSCULAR | Status: DC | PRN
  Administered 2012-05-27 – 2012-05-28 (×2): 30 mg via INTRAVENOUS
  Filled 2012-05-27 (×2): qty 1

## 2012-05-27 MED ORDER — LORAZEPAM 1 MG PO TABS
1.0000 mg | ORAL_TABLET | Freq: Once | ORAL | Status: AC
  Administered 2012-05-27: 1 mg via ORAL
  Filled 2012-05-27: qty 1

## 2012-05-27 MED ORDER — HYDROMORPHONE HCL PF 1 MG/ML IJ SOLN
1.0000 mg | Freq: Once | INTRAMUSCULAR | Status: AC
  Administered 2012-05-27: 1 mg via INTRAVENOUS
  Filled 2012-05-27: qty 1

## 2012-05-27 MED ORDER — METHOCARBAMOL 100 MG/ML IJ SOLN
500.0000 mg | Freq: Three times a day (TID) | INTRAVENOUS | Status: DC
  Administered 2012-05-27: 500 mg via INTRAVENOUS
  Filled 2012-05-27 (×3): qty 5

## 2012-05-27 MED ORDER — TRAZODONE HCL 100 MG PO TABS
200.0000 mg | ORAL_TABLET | Freq: Every day | ORAL | Status: DC
Start: 2012-05-27 — End: 2012-05-28
  Administered 2012-05-27: 200 mg via ORAL
  Filled 2012-05-27 (×2): qty 2

## 2012-05-27 MED ORDER — PREGABALIN 50 MG PO CAPS: 150.0000 mg | ORAL_CAPSULE | Freq: Three times a day (TID) | ORAL | Status: DC

## 2012-05-27 MED ORDER — FENTANYL CITRATE 0.05 MG/ML IJ SOLN
100.0000 ug | Freq: Once | INTRAMUSCULAR | Status: AC
  Administered 2012-05-27: 100 ug via INTRAMUSCULAR
  Filled 2012-05-27: qty 2

## 2012-05-27 MED ORDER — ONDANSETRON HCL 4 MG/2ML IJ SOLN
4.0000 mg | Freq: Three times a day (TID) | INTRAMUSCULAR | Status: AC | PRN
  Administered 2012-05-27: 4 mg via INTRAVENOUS
  Filled 2012-05-27: qty 2

## 2012-05-27 MED ORDER — NICOTINE 14 MG/24HR TD PT24
14.0000 mg | MEDICATED_PATCH | Freq: Once | TRANSDERMAL | Status: AC
  Administered 2012-05-27: 14 mg via TRANSDERMAL
  Filled 2012-05-27: qty 1

## 2012-05-27 MED ORDER — HYDROMORPHONE HCL PF 1 MG/ML IJ SOLN
1.0000 mg | INTRAMUSCULAR | Status: DC | PRN
  Administered 2012-05-27: 1 mg via INTRAVENOUS
  Administered 2012-05-27: 0.5 mg via INTRAVENOUS
  Administered 2012-05-27: 1.5 mg via INTRAVENOUS
  Filled 2012-05-27: qty 1
  Filled 2012-05-27: qty 2
  Filled 2012-05-27: qty 1

## 2012-05-27 MED ORDER — ONDANSETRON HCL 4 MG/2ML IJ SOLN
4.0000 mg | Freq: Once | INTRAMUSCULAR | Status: AC
  Administered 2012-05-27: 4 mg via INTRAVENOUS
  Filled 2012-05-27: qty 2

## 2012-05-27 MED ORDER — DIAZEPAM 5 MG PO TABS
5.0000 mg | ORAL_TABLET | Freq: Once | ORAL | Status: AC
  Administered 2012-05-27: 5 mg via ORAL
  Filled 2012-05-27: qty 1

## 2012-05-27 MED ORDER — LEVOTHYROXINE SODIUM 50 MCG PO TABS
50.0000 ug | ORAL_TABLET | Freq: Every day | ORAL | Status: DC
  Administered 2012-05-28: 50 ug via ORAL
  Filled 2012-05-27 (×2): qty 1

## 2012-05-27 MED ORDER — PREGABALIN 50 MG PO CAPS
150.0000 mg | ORAL_CAPSULE | Freq: Three times a day (TID) | ORAL | Status: DC
  Administered 2012-05-27 – 2012-05-28 (×4): 150 mg via ORAL
  Filled 2012-05-27 (×5): qty 3

## 2012-05-27 NOTE — Progress Notes (Signed)
Patient appeared eager for chaplain visit and requested prayer for herself and her family.  Patient states she is a recovering addict currently staying in a sober community in [place].  Pt. States she has completed 1 month of a 3 month program and she misses her little girl who is staying here in [place] while she is in [place]. Chaplain prayed with patient.  Will request follow up from [facility] Chaplain.  Chaplain Rutherford Nail

## 2012-05-27 NOTE — Progress Notes (Signed)
Seen and examined patient  Have reviewed chart  Multiple rx dependence issues including ambien, flexeril, narcotis, Suspected noncompliance with rx as wrritten Dr Victorino Dike from ortho cosnulted for ankle fracture  PT/OT eval Start IV toradol

## 2012-05-27 NOTE — ED Notes (Signed)
Patient tripped falling at the bottom of her steps, left ankle swollen and painful, small lac under right second toe  Denis LOC  C/o pain bilateral shoulders and back

## 2012-05-27 NOTE — ED Provider Notes (Signed)
History     CSN: [id number]  Arrival date & time 05/27/12  0019   First MD Initiated Contact with Patient 05/27/12 0102      Chief Complaint  Patient presents with  . Fall    (Consider location/radiation/quality/duration/timing/severity/associated sxs/prior treatment) Patient is a 36 y.o. female presenting with fall. The history is provided by the patient.  Fall  She fell down 5 or 6 steps suffering injuries to both feet and both ankles. She is also complaining of pain in her neck and in her back. She states she's had 4 back surgeries. She denies head injury or loss of consciousness. Back pain is on the lumbar area. She's not in any weakness or numbness or tingling. She was transported by EMS who treated her with full spinal immobilization, but they were unable to apply cervical collar because of adiposity. She states her pain is severe and she rates at 10/10. She also states she is a recovering addict and has been clean for 2 years. She suffered a laceration in her right second toe. She states she is up-to-date on tetanus immunizations. She is status post tubal ligation.  Past Medical History  Diagnosis Date  . Narcotic abuse in remission   . Thyroid disease   . Chronic back pain     Past Surgical History  Procedure Date  . Back surgery     History reviewed. No pertinent family history.  History  Substance Use Topics  . Smoking status: Current Every Doig Smoker -- 1.0 packs/[place]  . Smokeless tobacco: Not on file  . Alcohol Use: No    OB History    Grav Para Term Preterm Abortions TAB SAB Ect Mult Living                  Review of Systems  All other systems reviewed and are negative.    Allergies  Avelox; Levofloxacin; Penicillins; and Quinolones  Home Medications   Current Outpatient Rx  Name Route Sig Dispense Refill  . LEVOTHYROXINE SODIUM 50 MCG PO TABS Oral Take 1 tablet (50 mcg total) by mouth daily. 90 tablet 1  . PREGABALIN 150 MG PO CAPS Oral Take  150 mg by mouth 3 (three) times daily.    . TRAZODONE HCL 100 MG PO TABS Oral Take 2 tablets (200 mg total) by mouth at bedtime. 180 tablet 1    BP 130/68  Pulse 91  SpO2 99%  Physical Exam  Nursing note and vitals reviewed. Morbidly obese 36 year old female, resting comfortably and in no acute distress. Vital signs are normal. Oxygen saturation is 99%, which is normal. Head is normocephalic and atraumatic. PERRLA, EOMI. Oropharynx is clear. Neck is mildly tender without adenopathy or JVD. Back is mildly tender in the lumbar region without any thoracic tenderness, and there is no CVA tenderness. Lungs are clear without rales, wheezes, or rhonchi. Chest is nontender. Heart has regular rate and rhythm without murmur. Abdomen is soft, flat, nontender without masses or hepatosplenomegaly and peristalsis is normoactive. Pelvis is nontender and stable. Extremities have no cyanosis or edema. There is soft tissue swelling and tenderness over the lateral aspect of both ankles and both feet. There is a transverse laceration over the plantar surface of the right second toe. Neurovascular exam is intact with strong pulses, prompt capillary refill, normal sensation. Skin is warm and dry without rash. Neurologic: Mental status is normal, cranial nerves are intact, there are no motor or sensory deficits.   ED Course  Procedures (including critical care time)  Dg Lumbar Spine Complete  05/27/2012  *RADIOLOGY REPORT*  Clinical Data: Fall, pain.  LUMBAR SPINE - COMPLETE 4+ VIEW  Comparison: Plain films lumbar spine 02/04/2011.  Findings: The patient is status post L5-S1 fusion.  Vertebral body height and alignment are maintained.  Intervertebral disc space height is unremarkable.  Paraspinous structures appear normal.  IMPRESSION: No acute finding.  Status post L5-S1 fusion.   Original Report Authenticated By: Holley Dexter, M.D.    Dg Ankle Complete Left  05/27/2012  *RADIOLOGY REPORT*  Clinical  Data: Fall, pain.  LEFT ANKLE COMPLETE - 3+ VIEW  Comparison: Plain films of the left foot 09/15/2008.  Findings: There is an acute appearing avulsion fracture off the medial malleolus.  There is partial visualization of a distal fifth metatarsal fracture.  Imaged bones and joints otherwise appear normal.  IMPRESSION:  1.  Acute, small avulsion fracture medial malleolus. 2.  Distal fifth metatarsal fracture.   Original Report Authenticated By: Holley Dexter, M.D.    Dg Ankle Complete Right  05/27/2012  *RADIOLOGY REPORT*  Clinical Data: Fall, pain.  RIGHT ANKLE - COMPLETE 3+ VIEW  Comparison: None.  Findings: Imaged bones, joints and soft tissues appear normal.  IMPRESSION: Negative study.   Original Report Authenticated By: Holley Dexter, M.D.    Ct Cervical Spine Wo Contrast  05/27/2012  *RADIOLOGY REPORT*  Clinical Data: Status post fall.  Neck pain.  CT CERVICAL SPINE WITHOUT CONTRAST  Technique:  Multidetector CT imaging of the cervical spine was performed. Multiplanar CT image reconstructions were also generated.  Comparison: Cervical spine CT scan 10/06/2009.  Findings: There is no fracture or subluxation of the cervical spine.  Intervertebral disc space height is maintained.  Lung apices are clear.  IMPRESSION: Negative exam.   Original Report Authenticated By: Holley Dexter, M.D.    Dg Foot Complete Left  05/27/2012  *RADIOLOGY REPORT*  Clinical Data: Fall.  Pain.  LEFT FOOT - COMPLETE 3+ VIEW  Comparison: Plain films 09/15/2008.  Findings: The patient has an incomplete, nondisplaced fracture of the distal diaphysis of the fifth metatarsal.  Avulsion fracture of the medial malleolus is also identified.  No other acute bony or joint abnormality is seen.  IMPRESSION:  1.  Nondisplaced and incomplete distal fifth metatarsal fracture. 2.  Small avulsion fracture medial malleolus.   Original Report Authenticated By: Holley Dexter, M.D.    Dg Foot Complete Right  05/27/2012  *RADIOLOGY  REPORT*  Clinical Data: Fall, pain.  RIGHT FOOT COMPLETE - 3+ VIEW  Comparison: None.  Findings: The patient has a fracture of the distal second metatarsal.  The fracture extends from the medial most aspect of the head through the lateral aspect of the neck and appears mildly impacted.  There also appears to be a fracture through the base of the proximal phalanx of the third toe.  No other acute bony or joint abnormality is identified.  IMPRESSION:  1.  Distal second metatarsal fracture. 2.  Fractures of the base of the proximal phalanx of the third toe.   Original Report Authenticated By: Holley Dexter, M.D.    LACERATION REPAIR Performed by: Dione Booze Authorized by: Dione Booze Consent: Verbal consent obtained. Risks and benefits: risks, benefits and alternatives were discussed Consent given by: patient Patient identity confirmed: provided demographic data Prepped and Draped in normal sterile fashion Wound explored  Laceration Location: Right second toe  Laceration Length: 2.5 cm  No Foreign Bodies seen or palpated  Anesthesia: local infiltration  Local anesthetic: lidocaine 2% without epinephrine  Anesthetic total: 2 ml  Irrigation method: syringe Amount of cleaning: standard  Skin closure: close  Number of sutures: 3   Technique: Simple interrupted   Patient tolerance: Patient tolerated the procedure well with no immediate complications.   1. Closed fracture of medial malleolus of left ankle   2. Closed nondisplaced fracture of fifth left metatarsal bone   3. Fracture of third toe, right, closed   4. Nondisplaced fracture of second metatarsal bone of right foot   5. Laceration of second toe, right       MDM  Fall with bilateral foot and ankle injury. I doubt significant neck or back injury, although CT will be obtained of the cervical spine and x-rays obtained of lumbar spine. The laceration will need suturing. X-rays obtained of both feet and ankles.  X-rays  show a avulsion fracture the medial aspect of the left ankle as well as a fracture of the fifth metatarsal. This can be treated adequately with a application of a Cam Walker. She also has fracture of the right third toe and right second mother tarsal. This would normally be treated with postop shoe. However, the fractures on each foot should be nonweightbearing. Patient lives alone. She clearly cannot live at home. Cam Dan Humphreys is applied to her left ankle and case is discussed with Dr. Julian Reil of [facility]. She will be admitted under observation status in order to obtain social service consult to arrange appropriate living situation and she is able to bear weight. Orthopedic consultation will be obtained as well.     Dione Booze, MD 05/27/12 [phone]

## 2012-05-27 NOTE — ED Notes (Signed)
Spoke with Dr. Preston Fleeting about removing the patient from the back board.  Explained to him that she has had several back surgeries and he requested that we wait until he can be in the room with [place].  Patient yelling out loud about wanting an IV and something for pain.

## 2012-05-27 NOTE — H&P (Signed)
[facility] History and Physical  [name] Flattery Umali HQI:[id number] DOB: [birth year] DOA: 05/27/2012  Referring physician: ED PCP: Sheila Oats, MD  Specialists: None  Chief Complaint: Fall  HPI: [name] is a 36 y.o. female who suffered a mechanical fall down a flight of stairs this evening.  Pain onset during fall, located in both feet, alleviated by pain meds, exacerbated by weight bearing.  Evaluation in the ED revealed fractures of both feet and her L ankle.  Hospitalist has been asked to admit.  Review of Systems: 12 systems reviewed and negative except as per HPI.  Past Medical History  Diagnosis Date  . Narcotic abuse in remission   . Thyroid disease   . Chronic back pain    Past Surgical History  Procedure Date  . Back surgery    Social History:  reports that she has been smoking.  She does not have any smokeless tobacco history on file. She reports that she uses illicit drugs. She reports that she does not drink alcohol.   Allergies  Allergen Reactions  . Avelox (Moxifloxacin Hcl In Nacl) Hives  . Levofloxacin Hives  . Penicillins Hives  . Quinolones Hives    History reviewed. No pertinent family history.  Prior to Admission medications   Medication Sig Start Date End Date Taking? Authorizing Provider  levothyroxine (SYNTHROID, LEVOTHROID) 50 MCG tablet Take 1 tablet (50 mcg total) by mouth daily. 10/30/11  Yes Tonye Pearson, MD  pregabalin (LYRICA) 150 MG capsule Take 150 mg by mouth 3 (three) times daily.   Yes Historical Provider, MD  traZODone (DESYREL) 100 MG tablet Take 2 tablets (200 mg total) by mouth at bedtime. 10/30/11  Yes Tonye Pearson, MD   Physical Exam: Filed Vitals:   05/27/12 0030 05/27/12 0045 05/27/12 0100  BP: 126/73 130/68 139/74  Pulse:  91 91  SpO2:  99% 100%    General:  NAD, resting comfortably in bed Eyes: PEERLA EOMI ENT: mucous membranes moist Neck: supple w/o JVD Cardiovascular: RRR w/o MRG Respiratory: CTA  B Abdomen: soft, nt, nd, bs+ Skin: no rash nor lesion Musculoskeletal: MAE, full ROM all 4 extremities Psychiatric: normal tone and affect Neurologic: AAOx3, grossly non-focal  Labs on Admission:  Basic Metabolic Panel: No results found for this basename: NA:5,K:5,CL:5,CO2:5,GLUCOSE:5,BUN:5,CREATININE:5,CALCIUM:5,MG:5,PHOS:5 in the last 168 hours Liver Function Tests: No results found for this basename: AST:5,ALT:5,ALKPHOS:5,BILITOT:5,PROT:5,ALBUMIN:5 in the last 168 hours No results found for this basename: LIPASE:5,AMYLASE:5 in the last 168 hours No results found for this basename: AMMONIA:5 in the last 168 hours CBC: No results found for this basename: WBC:5,NEUTROABS:5,HGB:5,HCT:5,MCV:5,PLT:5 in the last 168 hours Cardiac Enzymes: No results found for this basename: CKTOTAL:5,CKMB:5,CKMBINDEX:5,TROPONINI:5 in the last 168 hours  BNP (last 3 results) No results found for this basename: PROBNP:3 in the last 8760 hours CBG: No results found for this basename: GLUCAP:5 in the last 168 hours  Radiological Exams on Admission: Dg Lumbar Spine Complete  05/27/2012  *RADIOLOGY REPORT*  Clinical Data: Fall, pain.  LUMBAR SPINE - COMPLETE 4+ VIEW  Comparison: Plain films lumbar spine 02/04/2011.  Findings: The patient is status post L5-S1 fusion.  Vertebral body height and alignment are maintained.  Intervertebral disc space height is unremarkable.  Paraspinous structures appear normal.  IMPRESSION: No acute finding.  Status post L5-S1 fusion.   Original Report Authenticated By: Holley Dexter, M.D.    Dg Ankle Complete Left  05/27/2012  *RADIOLOGY REPORT*  Clinical Data: Fall, pain.  LEFT ANKLE COMPLETE - 3+ VIEW  Comparison: Plain  films of the left foot 09/15/2008.  Findings: There is an acute appearing avulsion fracture off the medial malleolus.  There is partial visualization of a distal fifth metatarsal fracture.  Imaged bones and joints otherwise appear normal.  IMPRESSION:  1.  Acute,  small avulsion fracture medial malleolus. 2.  Distal fifth metatarsal fracture.   Original Report Authenticated By: Holley Dexter, M.D.    Dg Ankle Complete Right  05/27/2012  *RADIOLOGY REPORT*  Clinical Data: Fall, pain.  RIGHT ANKLE - COMPLETE 3+ VIEW  Comparison: None.  Findings: Imaged bones, joints and soft tissues appear normal.  IMPRESSION: Negative study.   Original Report Authenticated By: Holley Dexter, M.D.    Ct Cervical Spine Wo Contrast  05/27/2012  *RADIOLOGY REPORT*  Clinical Data: Status post fall.  Neck pain.  CT CERVICAL SPINE WITHOUT CONTRAST  Technique:  Multidetector CT imaging of the cervical spine was performed. Multiplanar CT image reconstructions were also generated.  Comparison: Cervical spine CT scan 10/06/2009.  Findings: There is no fracture or subluxation of the cervical spine.  Intervertebral disc space height is maintained.  Lung apices are clear.  IMPRESSION: Negative exam.   Original Report Authenticated By: Holley Dexter, M.D.    Dg Foot Complete Left  05/27/2012  *RADIOLOGY REPORT*  Clinical Data: Fall.  Pain.  LEFT FOOT - COMPLETE 3+ VIEW  Comparison: Plain films 09/15/2008.  Findings: The patient has an incomplete, nondisplaced fracture of the distal diaphysis of the fifth metatarsal.  Avulsion fracture of the medial malleolus is also identified.  No other acute bony or joint abnormality is seen.  IMPRESSION:  1.  Nondisplaced and incomplete distal fifth metatarsal fracture. 2.  Small avulsion fracture medial malleolus.   Original Report Authenticated By: Holley Dexter, M.D.    Dg Foot Complete Right  05/27/2012  *RADIOLOGY REPORT*  Clinical Data: Fall, pain.  RIGHT FOOT COMPLETE - 3+ VIEW  Comparison: None.  Findings: The patient has a fracture of the distal second metatarsal.  The fracture extends from the medial most aspect of the head through the lateral aspect of the neck and appears mildly impacted.  There also appears to be a fracture through the  base of the proximal phalanx of the third toe.  No other acute bony or joint abnormality is identified.  IMPRESSION:  1.  Distal second metatarsal fracture. 2.  Fractures of the base of the proximal phalanx of the third toe.   Original Report Authenticated By: Holley Dexter, M.D.     EKG: Independently reviewed.  Assessment/Plan Active Problems:  Fall down stairs  Foot fracture   1. Fall down stairs with fractures in both feet - L ankle, patient currently NWB BLE, has good pulses in both DPs on exam, pain control, does have previous history of narcotic abuse that's currently in remission but will need to use at this time due to obvious pain from broken bones.  Likely needs ortho consult as well to address weight bearing status.  Code Status: Full Code (must indicate code status--if unknown or must be presumed, indicate so) Family Communication: No family in room (indicate person spoken with, if applicable, with phone number if by telephone) Disposition Plan: Admit to obs (indicate anticipated LOS)  Time spent: 50 min  GARDNER, JARED M. [facility] Pager [phone]  If 7PM-7AM, please contact night-coverage [email or web address] Password [facility] 05/27/2012, 5:38 AM

## 2012-05-27 NOTE — Progress Notes (Signed)
Orthopedic Tech Progress Note Patient Details:  [name] [birth year] [id number] CAM Walker applied to Left LE. Patient stated she felt as though she could pull her foot out of walker. A smaller size was fitted for patient. Smaller size ok for comfort; patient stated she felt like her heel was not all the way back in walker. Tech able to push finger between walker and back of boot due to natural give in material. It was explained to patient that the soft extra padding left in bootie of walker is probably why she cannot feel the back of walker. Tech explained this is a cautionary measure to further protect all aspects and bony structures of foot, including calcaneous.  Toes just below top of bootie, adequate amount of room at the toe/top of hard base walker. Nurse present in and out of room during fitting, patient transported to 5N right after Tech finished fitting.  Ortho Devices Type of Ortho Device: CAM walker Ortho Device/Splint Location: Left LE Ortho Device/Splint Interventions: Ordered   [place] R Thompson 05/27/2012, 8:19 AM

## 2012-05-27 NOTE — Progress Notes (Signed)
Orthopedic Tech Progress Note Patient Details:  [name] Dec 06, 1975 [id number] Additional note added to previous Ortho Tech note:  Called to 5N to speak with patient's nurse [name]) to follow up on patient's Cam Walker. Stated that during fitting patient kept moving foot then complaining of discomfort. It was suggested to nurse to remind patient to keep foot still. Nurse stated that patient has had no complications with CAM Walker since arriving on floor.   Patient ID: [name] Flattery Inzunza, female   DOB: 1975-10-15, 36 y.o.   MRN: [id number]   Orie Rout 05/27/2012, 9:29 AM

## 2012-05-27 NOTE — Consult Note (Signed)
Reason for Consult: bilat LE injuries Referring Physician: Dr. Waynetta Pean [name] is an 36 y.o. [name].  HPI: 36 y/o [name] with PMH of narcotic abuse fell down stairs at her parents house last night while visiting.  She described inversion / PF injuries bilat.  She had a laceration tot he plantar aspect of the right 2nd toe that was sutured in the ED.  SHe c/o pain in the right forefoot and in the left ankle and lateral border of thefoot.  She denies any h/o previous injury or surgery to the feet / ankles.  She c/o severe pain medially, laterally and anteriorly at the left ankle and less severe at the lateral border of the foot.  More moderate pain at theright forefoot.  She is not diabetic but smokes 1 ppd.  She is hypothyroid.  She has a h/o multiple back surgeries by Dr. Shelle Iron.   Past Medical History  Diagnosis Date  . Narcotic abuse in remission   . Thyroid disease   . Chronic back pain   . Hypothyroidism   . Anxiety   . GERD (gastroesophageal reflux disease)   . Sleep apnea     cpap  . Headache   . Neuromuscular disorder     neuropathy  . Cancer     vaginal  malignant carcinoma  . Arthritis     knees bilateral   DJD  stenosis   . Blood dyscrasia     HSV    Past Surgical History  Procedure Date  . Back surgery   . Cesarean section   . Tonsillectomy   . Tubal ligation     History reviewed. No pertinent family history.  Social History:  reports that she has been smoking Cigarettes.  She has a 17 pack-year smoking history. She has never used smokeless tobacco. She reports that she uses illicit drugs. She reports that she does not drink alcohol.  Allergies:  Allergies  Allergen Reactions  . Avelox (Moxifloxacin Hcl In Nacl) Hives  . Levofloxacin Hives  . Penicillins Hives  . Quinolones Hives    Medications: I have reviewed the patient's current medications.  Results for orders placed during the hospital encounter of 05/27/12 (from the past 48 hour(s))  CBC      Status: Abnormal   Collection Time   05/27/12  5:40 AM      Component Value Range Comment   WBC 11.6 (*) 4.0 - 10.5 K/uL    RBC 4.21  3.87 - 5.11 MIL/uL    Hemoglobin 12.4  12.0 - 15.0 g/dL    HCT 65.7  84.6 - 96.2 %    MCV 89.8  78.0 - 100.0 fL    MCH 29.5  26.0 - 34.0 pg    MCHC 32.8  30.0 - 36.0 g/dL    RDW 95.2  84.1 - 32.4 %    Platelets 152  150 - 400 K/uL   BASIC METABOLIC PANEL     Status: Abnormal   Collection Time   05/27/12  5:40 AM      Component Value Range Comment   Sodium 137  135 - 145 mEq/L    Potassium 3.7  3.5 - 5.1 mEq/L    Chloride 104  96 - 112 mEq/L    CO2 22  19 - 32 mEq/L    Glucose, Bld 132 (*) 70 - 99 mg/dL    BUN 14  6 - 23 mg/dL    Creatinine, Ser 4.01  0.50 - 1.10 mg/dL  Calcium 8.8  8.4 - 10.5 mg/dL    GFR calc non Af Amer 86 (*) >90 mL/min    GFR calc Af Amer >90  >90 mL/min   GLUCOSE, CAPILLARY     Status: Abnormal   Collection Time   05/27/12  9:07 AM      Component Value Range Comment   Glucose-Capillary 107 (*) 70 - 99 mg/dL    Comment 1 Notify RN       Dg Lumbar Spine Complete  05/27/2012  *RADIOLOGY REPORT*  Clinical Data: Fall, pain.  LUMBAR SPINE - COMPLETE 4+ VIEW  Comparison: Plain films lumbar spine 02/04/2011.  Findings: The patient is status post L5-S1 fusion.  Vertebral body height and alignment are maintained.  Intervertebral disc space height is unremarkable.  Paraspinous structures appear normal.  IMPRESSION: No acute finding.  Status post L5-S1 fusion.   Original Report Authenticated By: Holley Dexter, M.D.    Dg Ankle Complete Left  05/27/2012  *RADIOLOGY REPORT*  Clinical Data: Fall, pain.  LEFT ANKLE COMPLETE - 3+ VIEW  Comparison: Plain films of the left foot 09/15/2008.  Findings: There is an acute appearing avulsion fracture off the medial malleolus.  There is partial visualization of a distal fifth metatarsal fracture.  Imaged bones and joints otherwise appear normal.  IMPRESSION:  1.  Acute, small avulsion fracture  medial malleolus. 2.  Distal fifth metatarsal fracture.   Original Report Authenticated By: Holley Dexter, M.D.    Dg Ankle Complete Right  05/27/2012  *RADIOLOGY REPORT*  Clinical Data: Fall, pain.  RIGHT ANKLE - COMPLETE 3+ VIEW  Comparison: None.  Findings: Imaged bones, joints and soft tissues appear normal.  IMPRESSION: Negative study.   Original Report Authenticated By: Holley Dexter, M.D.    Ct Cervical Spine Wo Contrast  05/27/2012  *RADIOLOGY REPORT*  Clinical Data: Status post fall.  Neck pain.  CT CERVICAL SPINE WITHOUT CONTRAST  Technique:  Multidetector CT imaging of the cervical spine was performed. Multiplanar CT image reconstructions were also generated.  Comparison: Cervical spine CT scan 10/06/2009.  Findings: There is no fracture or subluxation of the cervical spine.  Intervertebral disc space height is maintained.  Lung apices are clear.  IMPRESSION: Negative exam.   Original Report Authenticated By: Holley Dexter, M.D.    Dg Foot Complete Left  05/27/2012  *RADIOLOGY REPORT*  Clinical Data: Fall.  Pain.  LEFT FOOT - COMPLETE 3+ VIEW  Comparison: Plain films 09/15/2008.  Findings: The patient has an incomplete, nondisplaced fracture of the distal diaphysis of the fifth metatarsal.  Avulsion fracture of the medial malleolus is also identified.  No other acute bony or joint abnormality is seen.  IMPRESSION:  1.  Nondisplaced and incomplete distal fifth metatarsal fracture. 2.  Small avulsion fracture medial malleolus.   Original Report Authenticated By: Holley Dexter, M.D.    Dg Foot Complete Right  05/27/2012  *RADIOLOGY REPORT*  Clinical Data: Fall, pain.  RIGHT FOOT COMPLETE - 3+ VIEW  Comparison: None.  Findings: The patient has a fracture of the distal second metatarsal.  The fracture extends from the medial most aspect of the head through the lateral aspect of the neck and appears mildly impacted.  There also appears to be a fracture through the base of the proximal  phalanx of the third toe.  No other acute bony or joint abnormality is identified.  IMPRESSION:  1.  Distal second metatarsal fracture. 2.  Fractures of the base of the proximal phalanx of the third toe.  Original Report Authenticated By: Holley Dexter, M.D.     ROS:  No recent f/c/n/v/wt loss PE:  Blood pressure 115/47, pulse 88, temperature 98.1 F (36.7 C), resp. rate 16, SpO2 97.00%. Obese [name] in nad.  A and O x 4.  Mood and affect normal.  EOMI.  Respirations unlabored.  R foot with 1 cm laceration at plantar 2nd toe.  5/5 strength in PF at IP joints.  Skin o/w healthy and intact.  No lymphadenopathy.  Sens to LT intact at bilat feet.  2+ dp and pt pulses bilat.  L ankle swollen.  ttp medially, laterally and anteriorly at the syndesmosis.  DF and ER stress causes pain at anterior aspect of the syndesmosis.  She also has TTP at 5th MT shaft.  No gross deformity.   Assessment/Plan: 1.  L ankle medial mal avulsion fx - this is essentially a ligamentous bimal equivalent, and I'm concerned that she could have a syndesmosis injury as well.  I'll order WB xrays of the L ankle.  If she's unable to tolerate, we'll consider an MRI.  In the meantime, she can bear weight as tolerated on the LLE in a cam boot.  2.  L 5th MT fracture - immobilize in cam boot and allow WBAT.  3.  R 2nd MT fracture - this is stable, and she can bear weight as tolerated in a hard sole shoe.  4.  R 3rd toe fracture - this is a stable fracture, and she can bear weight as tolerated in a hard sole shoe.  5.  Low back strain - PT, OT eval and treat for ROM and strengthening.  Overall this is a complex situation in a pt with a complicated PMH.  I believe there is significant risk for decreased functional status from these injuries.  I'll continue to follow.  [name] [name] 05/27/2012, 6:20 PM

## 2012-05-27 NOTE — Progress Notes (Signed)
Orthopedic Tech Progress Note Patient Details:  [name] 02-02-1976 [id number]  Ortho Devices Type of Ortho Device: Postop boot Ortho Device/Splint Location: right foot Ortho Device/Splint Interventions: Application   [name] 05/27/2012, 10:30 PM

## 2012-05-27 NOTE — ED Notes (Signed)
Admitting physician at bedside

## 2012-05-27 NOTE — ED Notes (Signed)
C/o cramp in left leg, patient is yelling out and crying stating she is in severe pain. Sitting up on stretcher beating on side rails, Dr notified. Order given for ativan.

## 2012-05-28 ENCOUNTER — Telehealth: Payer: Self-pay

## 2012-05-28 LAB — GLUCOSE, CAPILLARY: Glucose-Capillary: 152 mg/dL — ABNORMAL HIGH (ref 70–99)

## 2012-05-28 LAB — BASIC METABOLIC PANEL
BUN: 16 mg/dL (ref 6–23)
Chloride: 102 mEq/L (ref 96–112)
GFR calc Af Amer: 80 mL/min — ABNORMAL LOW (ref >90)
GFR calc non Af Amer: 69 mL/min — ABNORMAL LOW (ref >90)
Potassium: 4.5 mEq/L (ref 3.5–5.1)
Sodium: 136 mEq/L (ref 135–145)

## 2012-05-28 MED ORDER — HYDROCODONE-ACETAMINOPHEN 7.5-750 MG PO TABS: 1.0000 | ORAL_TABLET | Freq: Four times a day (QID) | ORAL | Status: DC | PRN

## 2012-05-28 MED ORDER — PAROXETINE HCL 40 MG PO TABS: 40.0000 mg | ORAL_TABLET | ORAL | Status: DC

## 2012-05-28 MED ORDER — HYDROMORPHONE HCL PF 1 MG/ML IJ SOLN
1.5000 mg | INTRAMUSCULAR | Status: DC | PRN
  Administered 2012-05-28 (×3): 2 mg via INTRAVENOUS
  Filled 2012-05-28 (×3): qty 2

## 2012-05-28 MED ORDER — OXYCODONE-ACETAMINOPHEN 7.5-325 MG PO TABS: 1.0000 | ORAL_TABLET | Freq: Three times a day (TID) | ORAL | Status: DC | PRN

## 2012-05-28 MED ORDER — METHOCARBAMOL 500 MG PO TABS: 500.0000 mg | ORAL_TABLET | Freq: Three times a day (TID) | ORAL | Status: DC | PRN

## 2012-05-28 NOTE — Progress Notes (Signed)
   CARE MANAGEMENT NOTE 05/28/2012  Patient:  [name]   Account Number:  [id number]  Date Initiated:  05/28/2012  Documentation initiated by:  [place]  Subjective/Objective Assessment:   Left ankle medial avulsion fracture  Left fifth metatarsal fracture     Action/Plan:   Anticipated DC Date:  05/28/2012   Anticipated DC Plan:  HOME W HOME HEALTH SERVICES      DC Planning Services  CM consult      [facility] Choice  HOME HEALTH   Choice offered to / List presented to:  [name]   DME arranged  [facility]an Hurst      DME agency  [facility]     HH arranged  HH-2 PT  HH-3 OT      HH agency  [name]   Status of service:  Completed, signed off Medicare Important Message given?   (If response is "NO", the following Medicare IM given date fields will be blank) Date Medicare IM given:   Date Additional Medicare IM given:    Discharge Disposition:  HOME W HOME HEALTH SERVICES  Per UR Regulation:    If discussed at Long Length of Stay Meetings, dates discussed:    Comments:  05/28/2012 1400 NCM spoke to pt and offered choice for [name]. Pt states she did not have preference for [name]. NCM contacted [name] for [facility] PT/OT. Will fax orders to [name]. RW delivered to room by [facility]. Isidoro Donning RN CCM Case Mgmt phone [phone]

## 2012-05-28 NOTE — Progress Notes (Signed)
UR completed.    Markas Aldredge Wise Ilian Wessell, RN, BSN Phone #[phone]  

## 2012-05-28 NOTE — Evaluation (Signed)
Physical Therapy Evaluation Patient Details Name: [name] MRN: [id number] DOB: 11/02/1975 Today's Date: 05/28/2012 Time: 4098-1191 PT Time Calculation (min): 28 min  PT Assessment / Plan / Recommendation Clinical Impression  PT indicated for mobility training and education to ensure safe d/c home alone.    PT Assessment  Patient needs continued PT services    Follow Up Recommendations  Home health PT    Does the patient have the potential to tolerate intense rehabilitation      Barriers to Discharge Decreased caregiver support      Equipment Recommendations  Rolling walker with 5" wheels    Recommendations for Other Services     Frequency Min 5X/week    Precautions / Restrictions Precautions Precautions: None Required Braces or Orthoses: Other Brace/Splint Other Brace/Splint: cam boot L, post-op shoe R Restrictions Weight Bearing Restrictions: Yes RLE Weight Bearing: Weight bearing as tolerated LLE Weight Bearing: Weight bearing as tolerated   Pertinent Vitals/Pain       Mobility  Bed Mobility Bed Mobility: Supine to Sit;Sitting - Scoot to Edge of Bed Supine to Sit: 6: Modified independent (Device/Increase time) Sitting - Scoot to Edge of Bed: 6: Modified independent (Device/Increase time) Transfers Sit to Stand: 5: Supervision;With upper extremity assist;From bed;From chair/3-in-1 Stand to Sit: 6: Modified independent (Device/Increase time);To chair/3-in-1;With armrests Details for Transfer Assistance: verbal cues for hand placement Ambulation/Gait Ambulation/Gait Assistance: 5: Supervision Ambulation Distance (Feet): 25 Feet Assistive device: Rolling walker Ambulation/Gait Assistance Details: verbal cues for RW management Gait Pattern: Antalgic;Step-to pattern Gait velocity: decreased Stairs: Yes Stairs Assistance: 4: Min assist Stairs Assistance Details (indicate cue type and reason): verbal cues for sequencing, safety, and technique Stair  Management Technique: No rails;With walker;Backwards Number of Stairs: 3     Shoulder Instructions     Exercises     PT Diagnosis: Difficulty walking;Acute pain  PT Problem List: Decreased activity tolerance;Decreased mobility;Pain;Decreased knowledge of use of DME PT Treatment Interventions: DME instruction;Gait training;Stair training;Functional mobility training;Therapeutic activities;Patient/family education   PT Goals Acute Rehab PT Goals PT Goal Formulation: With patient Time For Goal Achievement: 06/04/12 Potential to Achieve Goals: Good Pt will go Sit to Stand: with modified independence PT Goal: Sit to Stand - Progress: Goal set today Pt will go Stand to Sit: with modified independence PT Goal: Stand to Sit - Progress: Goal set today Pt will Transfer Bed to Chair/Chair to Bed: with modified independence PT Transfer Goal: Bed to Chair/Chair to Bed - Progress: Goal set today Pt will Ambulate: 51 - 150 feet;with modified independence;with rolling walker PT Goal: Ambulate - Progress: Goal set today  Visit Information  Last PT Received On: 05/28/12 Assistance Needed: +1 PT/OT Co-Evaluation/Treatment: Yes    Subjective Data  Subjective: Pt very tearful re: d/c situation.  Pt with h/o narcotics abuse, so pt's parents unwilling to allow pt to d/c to their house as opposed to her apt.  Pt's desire is to remain in the hospital 1-2 more days until she is better able to care for herself. Patient Stated Goal: independence   Prior Functioning  Home Living Lives With: Alone Available Help at Discharge: Other (Comment) (has family but states they are unavailable) Type of Home: Apartment Home Access: Stairs to enter Entrance Stairs-Number of Steps: 4 Entrance Stairs-Rails: Left (states long walk to back door) Home Layout: One level Bathroom Shower/Tub: Engineer, manufacturing systems: Standard Bathroom Accessibility: No Home Adaptive Equipment: None Prior Function Level of  Independence: Independent Able to Take Stairs?: Yes Driving: Yes Communication  Communication: No difficulties Dominant Hand: Right    Cognition  Overall Cognitive Status: History of cognitive impairments - at baseline Arousal/Alertness: Awake/alert Orientation Level: Appears intact for tasks assessed Behavior During Session: Anxious Cognition - Other Comments: Pt very tearful re: d/c plans.  Apparent dynamics between pt and family members not optimal due to narcotics abuse in the past.    Extremity/Trunk Assessment     Balance    End of Session PT - End of Session Equipment Utilized During Treatment: Gait belt;Other (comment) (L cam boot, R post-op shoe) Activity Tolerance: Patient tolerated treatment well Patient left: in chair;with call bell/phone within reach Nurse Communication: Mobility status  GP Functional Limitation: Mobility: Walking and moving around Mobility: Walking and Moving Around Current Status ([id number]): At least 20 percent but less than 40 percent impaired, limited or restricted Mobility: Walking and Moving Around Goal Status [phone]): At least 1 percent but less than 20 percent impaired, limited or restricted   Ilda Foil 05/28/2012, 2:39 PM  Aida Raider, PT  Office # [phone] Pager [phone]

## 2012-05-28 NOTE — Progress Notes (Signed)
Subjective: Pt c/o pain in both LEs.  She was able to complete WB xrays last night.  She asks me to increase her dilaudid to 2 mg per dose.   Objective: Vital signs in last 24 hours: Temp:  [97.4 F (36.3 C)-98.9 F (37.2 C)] 97.4 F (36.3 C) (11/02 0531) Pulse Rate:  [68-89] 81  (11/02 0531) Resp:  [16-18] 16  (11/02 0531) BP: (88-142)/(47-88) 142/88 mmHg (11/02 0707) SpO2:  [95 %-100 %] 97 % (11/02 0531)  Intake/Output from previous [facility]: 11/01 0701 - 11/02 0700 In: 243 [P.O.:240; I.V.:3] Out: 1000 [Urine:1000] Intake/Output this shift:     Basename 05/27/12 0540  HGB 12.4    Basename 05/27/12 0540  WBC 11.6*  RBC 4.21  HCT 37.8  PLT 152    Basename 05/28/12 0500 05/27/12 0540  NA 136 137  K 4.5 3.7  CL 102 104  CO2 25 22  BUN 16 14  CREATININE 1.02 0.85  GLUCOSE 120* 132*  CALCIUM 9.2 8.8   No results found for this basename: LABPT:2,INR:2 in the last 72 hours  Pt is sitting up eating breakfast.  She appears completely comfortable.  L LE immobilized in cam boot.   R LE in hard sole shoe.  Xray:  L ankle with normally reduced mortise on WB films.  Small avulsion fx at tip of medial mal and tip of lateral mal.   Assessment/Plan: 1.  L 5th MT shaft fx and L ankle avulsion fxs - she can bear weight safely in a cam boot.  Her ankle appears stable regarding the syndesmosis.  These fxs can be treated in closed fashion.  2.  R 2nd MT and 3rd toe fractures - continue closed treatment.  Pain management per hospitalist.  I'll continue to follow with you.     [name] 05/28/2012, 7:52 AM

## 2012-05-28 NOTE — Progress Notes (Signed)
Patient experiencing intermittent PVC's througout the night.  Patient asymptomatic, vitals stable.  Temp 98.1-HR 89-BP 105/57- RR 20, pulse ox at 96% on room air.  NP on call for MD aware, new orders for a basic metabolic panel in the morning.  Will continue to monitor.

## 2012-05-28 NOTE — Progress Notes (Signed)
Occupational Therapy Treatment Patient Details Name: [name] MRN: [id number] DOB: [birth year] Today's Date: 05/28/2012 Time: 1032-1100 OT Time Calculation (min): 28 min  OT Assessment / Plan / Recommendation Comments on Treatment Session Continued second session with PT to address higher level mobility, including stairs. Pt educated on home safety and mobility. Pt stated she would be able to get her neighbors to assist her getting in and out of the house, in addition to bringing her food. Feel pt is appropirate for D/C home with [facility] services. HHOT, HHPT, HHSW.    Follow Up Recommendations  Home health OT;Other (comment)    Barriers to Discharge  Decreased caregiver support psychosocial issues  Equipment Recommendations  Rolling walker with 5" wheels;Other (comment)    Recommendations for Other Services    Frequency Min 2X/week   Plan Discharge plan remains appropriate    Precautions / Restrictions Precautions Precautions: None Required Braces or Orthoses: Other Brace/Splint Other Brace/Splint: L cam;R postop shoe Restrictions Weight Bearing Restrictions: Yes RLE Weight Bearing: Weight bearing as tolerated LLE Weight Bearing: Weight bearing as tolerated   Pertinent Vitals/Pain Did not rate. Taught alternative technique to decrease pain with stair mgnt    ADL  Eating/Feeding: Independent Where Assessed - Eating/Feeding: Chair Grooming: Modified independent Where Assessed - Grooming: Unsupported standing Upper Body Bathing: Simulated;Modified independent Where Assessed - Upper Body Bathing: Unsupported sitting Lower Body Bathing: Supervision/safety Where Assessed - Lower Body Bathing: Unsupported sit to stand Upper Body Dressing: Modified independent Where Assessed - Upper Body Dressing: Unsupported sitting Lower Body Dressing: Supervision/safety Where Assessed - Lower Body Dressing: Unsupported sit to stand Toilet Transfer: Performed;Supervision/safety Toilet Transfer  Method: Sit to stand;Stand pivot Acupuncturist: Bedside commode;Other (comment) (BSC over toilet) Toileting - Clothing Manipulation and Hygiene: Modified independent Where Assessed - Toileting Clothing Manipulation and Hygiene: Standing Tub/Shower Transfer: Other (comment) (discussed technique to use with showerchair) Equipment Used: Gait belt;Rolling walker Transfers/Ambulation Related to ADLs: S ADL Comments: discussed availability of AE    OT Diagnosis: Generalized weakness;Acute pain  OT Problem List: Decreased range of motion;Decreased knowledge of use of DME or AE;Decreased knowledge of precautions;Obesity;Pain OT Treatment Interventions: Self-care/ADL training;Energy conservation;DME and/or AE instruction;Therapeutic activities;Patient/family education   OT Goals Acute Rehab OT Goals OT Goal Formulation: With patient Time For Goal Achievement: 06/04/12 Potential to Achieve Goals: Good ADL Goals Pt Will Perform Lower Body Bathing: with supervision;Sit to stand from chair;Unsupported;with adaptive equipment ADL Goal: Lower Body Bathing - Progress: Progressing toward goals Pt Will Perform Lower Body Dressing: with supervision;Sit to stand from chair;Unsupported;with adaptive equipment ADL Goal: Lower Body Dressing - Progress: Progressing toward goals Pt Will Transfer to Toilet: with modified independence;Ambulation;with DME ADL Goal: Toilet Transfer - Progress: Met Pt Will Perform Toileting - Clothing Manipulation: with modified independence;Standing ADL Goal: Toileting - Clothing Manipulation - Progress: Met Pt Will Perform Toileting - Hygiene: with modified independence;Standing at 3-in-1/toilet ADL Goal: Toileting - Hygiene - Progress: Met  Visit Information  Last OT Received On: 05/28/12 PT/OT Co-Evaluation/Treatment: Yes    Subjective Data  Subjective: I will go home and neighbors will help me.   Prior Functioning  Home Living Lives With: Alone;Other  (Comment) (states she does not have help at home) Available Help at Discharge: Other (Comment) (has family but states they are unavailable) Type of Home: Apartment Home Access: Stairs to enter Entrance Stairs-Number of Steps: 4 Entrance Stairs-Rails: Left (states long walk to back door) Home Layout: One level Bathroom Shower/Tub: Engineer, manufacturing systems: Standard Bathroom Accessibility:  No Home Adaptive Equipment: None Prior Function Level of Independence: Independent Able to Take Stairs?: Yes Driving: Yes Vocation: Other (comment) Comments: employed as Engineer, civil (consulting) with drug rehab? Communication Communication: No difficulties Dominant Hand: Right    Cognition  Overall Cognitive Status: History of cognitive impairments - at baseline Arousal/Alertness: Awake/alert Orientation Level: Appears intact for tasks assessed Behavior During Session: Anxious Cognition - Other Comments: Pt with apparent narcotic abuse history. Pt very tearful and upset throughout session regarding D/C plans. Apparent dynamics between family members and pt not optimal due to history of drug use per pt's father.     Mobility  Shoulder Instructions Bed Mobility Bed Mobility: Supine to Sit;Sitting - Scoot to Edge of Bed Supine to Sit: 6: Modified independent (Device/Increase time) Sitting - Scoot to Edge of Bed: 6: Modified independent (Device/Increase time) Transfers Transfers: Sit to Stand;Stand to Sit Sit to Stand: 5: Supervision;With upper extremity assist;From chair/3-in-1 Stand to Sit: 6: Modified independent (Device/Increase time);Without upper extremity assist;To chair/3-in-1 Details for Transfer Assistance: occ vc for hand placement on decline       Exercises      Balance  WFL:   End of Session OT - End of Session Equipment Utilized During Treatment: Gait belt;Other (comment) Activity Tolerance: Patient tolerated treatment well Patient left: in chair;with call bell/phone within  reach Nurse Communication: Other (comment) (D/C situation)  GO Functional Assessment Tool Used: clinical judgement Functional Limitation: Self care Self Care Current Status (J1914): At least 1 percent but less than 20 percent impaired, limited or restricted Self Care Goal Status (N8295): At least 1 percent but less than 20 percent impaired, limited or restricted Self Care Discharge Status [phone]): At least 1 percent but less than 20 percent impaired, limited or restricted   [facility] 05/28/2012, 11:35 AM [facility], OTR/L  [phone] 05/28/2012

## 2012-05-28 NOTE — Progress Notes (Signed)
Subjective: Circulation intact in both feet.No major issues.    Objective: Vital signs in last 24 hours: Temp:  [97.4 F (36.3 C)-98.9 F (37.2 C)] 97.4 F (36.3 C) (11/02 0531) Pulse Rate:  [68-89] 81  (11/02 0531) Resp:  [16-18] 16  (11/02 0531) BP: (88-142)/(47-88) 142/88 mmHg (11/02 0707) SpO2:  [95 %-100 %] 97 % (11/02 0531)  Intake/Output from previous [place]: 11/01 0701 - 11/02 0700 In: 243 [P.O.:240; I.V.:3] Out: 1000 [Urine:1000] Intake/Output this shift:     Basename 05/27/12 0540  HGB 12.4    Basename 05/27/12 0540  WBC 11.6*  RBC 4.21  HCT 37.8  PLT 152    Basename 05/28/12 0500 05/27/12 0540  NA 136 137  K 4.5 3.7  CL 102 104  CO2 25 22  BUN 16 14  CREATININE 1.02 0.85  GLUCOSE 120* 132*  CALCIUM 9.2 8.8   No results found for this basename: LABPT:2,INR:2 in the last 72 hours  Neurovascular intact  Assessment/Plan: Plans to go home with help.   [name] A 05/28/2012, 7:54 AM

## 2012-05-28 NOTE — Progress Notes (Signed)
Occupational Therapy Evaluation Patient Details Name: [name] MRN: [id number] DOB: 1975-09-15 Today's Date: 05/28/2012 Time: 4098-1191 OT Time Calculation (min): 23 min  OT Assessment / Plan / Recommendation Clinical Impression  36 yo with fall down steps with multiple foot fractures. Pt able to WBAT with R postop shoe and L cam boot. Pt extrememly tearful regarding D/C situation, asking to stay a couple of days until she can better take care of herself. Discussed D/C options, including the possibility of staying with her parents. Parents were called and Dad stated that pt was not going to be able to stay there if she was on narcotics. At that time, pt became very emotional. Nursing aware. Spiritual counseling recommended. Psychosicail issues aside, Pt with good performance with mobility. Pt will need wide RW with 5"wheels, HHOT HHPT and recommend HHSW. Will see again with PT this am.    OT Assessment  Patient needs continued OT Services    Follow Up Recommendations  Home health OT;Other (comment) (HHSW)    Barriers to Discharge Decreased caregiver support psychosocial issues  Equipment Recommendations  Rolling walker with 5" wheels;Other (comment) (wide RW)    Recommendations for Other Services    Frequency  Min 2X/week    Precautions / Restrictions Precautions Precautions: Fall Required Braces or Orthoses: Other Brace/Splint (L cam boot R post op shoe) Restrictions Weight Bearing Restrictions: Yes RLE Weight Bearing: Weight bearing as tolerated LLE Weight Bearing: Weight bearing as tolerated   Pertinent Vitals/Pain 4. B feet. nsg aware    ADL  Eating/Feeding: Independent Where Assessed - Eating/Feeding: Chair Grooming: Modified independent Where Assessed - Grooming: Unsupported standing Upper Body Bathing: Simulated;Modified independent Where Assessed - Upper Body Bathing: Unsupported sitting Lower Body Bathing: Supervision/safety Where Assessed - Lower Body Bathing:  Unsupported sit to stand Upper Body Dressing: Modified independent Where Assessed - Upper Body Dressing: Unsupported sitting Lower Body Dressing: Supervision/safety Where Assessed - Lower Body Dressing: Unsupported sit to stand Equipment Used: Gait belt;Rolling walker Transfers/Ambulation Related to ADLs: Minguard ADL Comments: may benefit from AE for LB ADL    OT Diagnosis: Generalized weakness;Acute pain  OT Problem List: Decreased range of motion;Decreased knowledge of use of DME or AE;Decreased knowledge of precautions;Obesity;Pain OT Treatment Interventions: Self-care/ADL training;Energy conservation;DME and/or AE instruction;Therapeutic activities;Patient/family education   OT Goals Acute Rehab OT Goals OT Goal Formulation: With patient Time For Goal Achievement: 06/04/12 Potential to Achieve Goals: Good ADL Goals Pt Will Perform Lower Body Bathing: with supervision;Sit to stand from chair;Unsupported;with adaptive equipment ADL Goal: Lower Body Bathing - Progress: Goal set today Pt Will Perform Lower Body Dressing: with supervision;Sit to stand from chair;Unsupported;with adaptive equipment ADL Goal: Lower Body Dressing - Progress: Goal set today Pt Will Transfer to Toilet: with modified independence;Ambulation;with DME ADL Goal: Toilet Transfer - Progress: Goal set today Pt Will Perform Toileting - Clothing Manipulation: with modified independence;Standing ADL Goal: Toileting - Clothing Manipulation - Progress: Goal set today Pt Will Perform Toileting - Hygiene: with modified independence;Standing at 3-in-1/toilet ADL Goal: Toileting - Hygiene - Progress: Goal set today  Visit Information  Last OT Received On: 05/28/12 PT/OT Co-Evaluation/Treatment: Yes    Subjective Data  Subjective: I'm glad youre here.   Prior Functioning     Home Living Lives With: Alone;Other (Comment) (states she does not have help at home) Available Help at Discharge: Other (Comment) (has  family but states they are unavailable) Type of Home: Apartment Home Access: Stairs to enter Entrance Stairs-Number of Steps: 4 Entrance Stairs-Rails: Left (  states long walk to back door) Home Layout: One level Bathroom Shower/Tub: Engineer, manufacturing systems: Standard Bathroom Accessibility: No Home Adaptive Equipment: None Prior Function Level of Independence: Independent Able to Take Stairs?: Yes Driving: Yes Vocation: Other (comment) Comments: employed as Engineer, civil (consulting) with drug rehab? Communication Communication: No difficulties Dominant Hand: Right         Vision/Perception Perception Perception: Within Functional Limits Praxis Praxis: Intact   Cognition  Overall Cognitive Status: History of cognitive impairments - at baseline Arousal/Alertness: Awake/alert Orientation Level: Appears intact for tasks assessed Behavior During Session: Anxious Cognition - Other Comments: Pt with apparent narcotic abuse history. Pt very tearful and upset throughout session regarding D/C plans. Apparent dynamics between family members and pt not optimal due to history of drug use per pt's father.     Extremity/Trunk Assessment Right Upper Extremity Assessment RUE ROM/Strength/Tone: WFL for tasks assessed Left Upper Extremity Assessment LUE ROM/Strength/Tone: WFL for tasks assessed Right Lower Extremity Assessment RLE ROM/Strength/Tone: Deficits RLE ROM/Strength/Tone Deficits: due to pain Left Lower Extremity Assessment LLE ROM/Strength/Tone: Deficits LLE ROM/Strength/Tone Deficits: due to pain Trunk Assessment Trunk Assessment: Normal     Mobility Bed Mobility Bed Mobility: Supine to Sit;Sitting - Scoot to Edge of Bed Supine to Sit: 6: Modified independent (Device/Increase time) Sitting - Scoot to Edge of Bed: 6: Modified independent (Device/Increase time) Transfers Transfers: Sit to Stand;Stand to Sit Sit to Stand: 4: Min guard;With upper extremity assist;From bed;From  chair/3-in-1;From toilet Stand to Sit: 4: Min guard;With upper extremity assist;To chair/3-in-1;To toilet Details for Transfer Assistance: vc for technique and safe hand placement     Shoulder Instructions     Exercise     Balance  WFL   End of Session OT - End of Session Equipment Utilized During Treatment: Gait belt;Other (comment) (cam boot; post op shoe) Activity Tolerance: Other (comment) (limited due to psychosocial issues) Patient left: in chair;with call bell/phone within reach;with nursing in room Nurse Communication: Other (comment) (emtional state)  GO Functional Assessment Tool Used: clinical judgement Functional Limitation: Self care Self Care Current Status (Z6109): At least 1 percent but less than 20 percent impaired, limited or restricted Self Care Goal Status (U0454): At least 1 percent but less than 20 percent impaired, limited or restricted Self Care Discharge Status [phone]): At least 1 percent but less than 20 percent impaired, limited or restricted   [name],[name] 05/28/2012, 11:25 AM [facility], OTR/L  [phone] 05/28/2012

## 2012-05-28 NOTE — Progress Notes (Signed)
Physical Therapy Treatment Patient Details Name: [name] MRN: [id number] DOB: 1975-08-18 Today's Date: 05/28/2012 Time: 1030-1059 PT Time Calculation (min): 29 min  PT Assessment / Plan / Recommendation Comments on Treatment Session  Pt with complicated PMH putting her at risk for functional decline following d/c home alone.  Aside from these complications, pt performed well with mobility skills.  Her current mobility level is sufficient for her to manage her home environment independently, with assist from neighbors/friends for ascending steps to enter apt.  She also reports friends are available to assist with groceries and MD appts.    Follow Up Recommendations  Home health PT     Does the patient have the potential to tolerate intense rehabilitation     Barriers to Discharge Decreased caregiver support      Equipment Recommendations  Rolling walker with 5" wheels    Recommendations for Other Services    Frequency Min 5X/week   Plan Discharge plan remains appropriate;Frequency remains appropriate    Precautions / Restrictions Precautions Precautions: None Required Braces or Orthoses: Other Brace/Splint Other Brace/Splint: L cam boot, R post op shoe Restrictions Weight Bearing Restrictions: Yes RLE Weight Bearing: Weight bearing as tolerated LLE Weight Bearing: Weight bearing as tolerated   Pertinent Vitals/Pain     Mobility  Bed Mobility Bed Mobility: Supine to Sit;Sitting - Scoot to Edge of Bed Supine to Sit: 6: Modified independent (Device/Increase time) Sitting - Scoot to Edge of Bed: 6: Modified independent (Device/Increase time) Transfers Sit to Stand: 5: Supervision;From chair/3-in-1;With armrests Stand to Sit: 6: Modified independent (Device/Increase time);With armrests;To chair/3-in-1 Details for Transfer Assistance: verbal cues for hand placement Ambulation/Gait Ambulation/Gait Assistance: 5: Supervision Ambulation Distance (Feet): 10 Feet (x  2) Assistive device: Rolling walker Ambulation/Gait Assistance Details: verbal cues for RW management Gait Pattern: Antalgic;Step-to pattern Gait velocity: decreased Stairs: Yes Stairs Assistance: 4: Min assist Stairs Assistance Details (indicate cue type and reason): verbal cues for sequencing, safety, and technique Stair Management Technique: No rails;Backwards;With walker Number of Stairs: 3     Exercises     PT Diagnosis: Difficulty walking;Acute pain  PT Problem List: Decreased activity tolerance;Decreased mobility;Pain;Decreased knowledge of use of DME PT Treatment Interventions: DME instruction;Gait training;Stair training;Functional mobility training;Therapeutic activities;Patient/family education   PT Goals Acute Rehab PT Goals PT Goal Formulation: With patient Time For Goal Achievement: 06/04/12 Potential to Achieve Goals: Good Pt will go Sit to Stand: with modified independence PT Goal: Sit to Stand - Progress: Goal set today Pt will go Stand to Sit: with modified independence PT Goal: Stand to Sit - Progress: Goal set today Pt will Transfer Bed to Chair/Chair to Bed: with modified independence PT Transfer Goal: Bed to Chair/Chair to Bed - Progress: Goal set today Pt will Ambulate: 51 - 150 feet;with modified independence;with rolling walker PT Goal: Ambulate - Progress: Goal set today  Visit Information  Last PT Received On: 05/28/12 Assistance Needed: +1 PT/OT Co-Evaluation/Treatment: Yes    Subjective Data  Subjective: Pt transitioning from tearful to flat re: d/c situation/needs, stating "I can take care of myself." Patient Stated Goal: independence   Cognition  Overall Cognitive Status: History of cognitive impairments - at baseline Arousal/Alertness: Awake/alert Orientation Level: Appears intact for tasks assessed Behavior During Session: Anxious Cognition - Other Comments: Pt very tearful re: d/c plans.  Apparent dynamics between pt and family members  not optimal due to narcotics abuse in the past.    Balance     End of Session PT - End of  Session Equipment Utilized During Treatment: Gait belt;Other (comment) (L cam boot, R post-op shoe) Activity Tolerance: Patient tolerated treatment well Patient left: in chair;with call bell/phone within reach Nurse Communication: Mobility status   GP Functional Limitation: Mobility: Walking and moving around Mobility: Walking and Moving Around Current Status (Z6109): At least 20 percent but less than 40 percent impaired, limited or restricted Mobility: Walking and Moving Around Goal Status [phone]): At least 1 percent but less than 20 percent impaired, limited or restricted   Ilda Foil 05/28/2012, 2:49 PM  Aida Raider, PT  Office # [phone] Pager [phone]

## 2012-05-28 NOTE — Progress Notes (Signed)
05/28/12 1200  Clinical Encounter Type  Visited With Patient  Visit Type Spiritual support  Referral From Chaplain   I visited and prayed with the patient about her concerns. She will be released from the hospital today.  Veryl Speak

## 2012-05-28 NOTE — Discharge Summary (Signed)
Physician Discharge Summary  [name] MRN: [id number] DOB/AGE: 1976-07-14 35 y.o.  PCP: Sheila Oats, MD   Admit date: 05/27/2012 Discharge date: 05/28/2012  Discharge Diagnoses:  Left ankle medial avulsion fracture Left fifth metatarsal fracture Right second and third toe fracture  Fall down stairs  Foot fracture     Medication List     As of 05/28/2012  8:21 AM    TAKE these medications         HYDROcodone-acetaminophen 7.5-750 MG per tablet   Commonly known as: VICODIN ES   Take 1 tablet by mouth every 6 (six) hours as needed for pain.      levothyroxine 50 MCG tablet   Commonly known as: SYNTHROID, LEVOTHROID   Take 1 tablet (50 mcg total) by mouth daily.      pregabalin 150 MG capsule   Commonly known as: LYRICA   Take 150 mg by mouth 3 (three) times daily.      traZODone 100 MG tablet   Commonly known as: DESYREL   Take 2 tablets (200 mg total) by mouth at bedtime.        Discharge Condition: Stable  Disposition: 01-Home or Self Care   Consults:  #1 orthopedics   Significant Diagnostic Studies: Dg Lumbar Spine Complete  05/27/2012  *RADIOLOGY REPORT*  Clinical Data: Fall, pain.  LUMBAR SPINE - COMPLETE 4+ VIEW  Comparison: Plain films lumbar spine 02/04/2011.  Findings: The patient is status post L5-S1 fusion.  Vertebral body height and alignment are maintained.  Intervertebral disc space height is unremarkable.  Paraspinous structures appear normal.  IMPRESSION: No acute finding.  Status post L5-S1 fusion.   Original Report Authenticated By: Holley Dexter, M.D.    Dg Ankle Complete Left  05/27/2012  *RADIOLOGY REPORT*  Clinical Data: Evaluation of syndesmosis instability.  LEFT ANKLE COMPLETE - 3+ VIEW  Comparison: 05/27/2012  Findings: Avulsion fractures again demonstrated off of the medial malleolus.  There is a second tiny bone fragment projected over the lateral aspect of the distal talus which also appears represent avulsion  fracture.  Joint space appears preserved without significant widening.  Mild soft tissue swelling.  IMPRESSION: Bone fragments consistent with avulsion fragments off of the medial malleolus and the lateral aspect of the distal talus.  Joint space appears intact.   Original Report Authenticated By: Burman Nieves, M.D.    Dg Ankle Complete Left  05/27/2012  *RADIOLOGY REPORT*  Clinical Data: Fall, pain.  LEFT ANKLE COMPLETE - 3+ VIEW  Comparison: Plain films of the left foot 09/15/2008.  Findings: There is an acute appearing avulsion fracture off the medial malleolus.  There is partial visualization of a distal fifth metatarsal fracture.  Imaged bones and joints otherwise appear normal.  IMPRESSION:  1.  Acute, small avulsion fracture medial malleolus. 2.  Distal fifth metatarsal fracture.   Original Report Authenticated By: Holley Dexter, M.D.    Dg Ankle Complete Right  05/27/2012  *RADIOLOGY REPORT*  Clinical Data: Fall, pain.  RIGHT ANKLE - COMPLETE 3+ VIEW  Comparison: None.  Findings: Imaged bones, joints and soft tissues appear normal.  IMPRESSION: Negative study.   Original Report Authenticated By: Holley Dexter, M.D.    Ct Cervical Spine Wo Contrast  05/27/2012  *RADIOLOGY REPORT*  Clinical Data: Status post fall.  Neck pain.  CT CERVICAL SPINE WITHOUT CONTRAST  Technique:  Multidetector CT imaging of the cervical spine was performed. Multiplanar CT image reconstructions were also generated.  Comparison: Cervical spine CT scan 10/06/2009.  Findings: There is no fracture or subluxation of the cervical spine.  Intervertebral disc space height is maintained.  Lung apices are clear.  IMPRESSION: Negative exam.   Original Report Authenticated By: Holley Dexter, M.D.    Dg Foot Complete Left  05/27/2012  *RADIOLOGY REPORT*  Clinical Data: Fall.  Pain.  LEFT FOOT - COMPLETE 3+ VIEW  Comparison: Plain films 09/15/2008.  Findings: The patient has an incomplete, nondisplaced fracture of the  distal diaphysis of the fifth metatarsal.  Avulsion fracture of the medial malleolus is also identified.  No other acute bony or joint abnormality is seen.  IMPRESSION:  1.  Nondisplaced and incomplete distal fifth metatarsal fracture. 2.  Small avulsion fracture medial malleolus.   Original Report Authenticated By: Holley Dexter, M.D.    Dg Foot Complete Right  05/27/2012  *RADIOLOGY REPORT*  Clinical Data: Fall, pain.  RIGHT FOOT COMPLETE - 3+ VIEW  Comparison: None.  Findings: The patient has a fracture of the distal second metatarsal.  The fracture extends from the medial most aspect of the head through the lateral aspect of the neck and appears mildly impacted.  There also appears to be a fracture through the base of the proximal phalanx of the third toe.  No other acute bony or joint abnormality is identified.  IMPRESSION:  1.  Distal second metatarsal fracture. 2.  Fractures of the base of the proximal phalanx of the third toe.   Original Report Authenticated By: Holley Dexter, M.D.      Microbiology: No results found for this or any previous visit (from the past 240 hour(s)).   Labs: Results for orders placed during the hospital encounter of 05/27/12 (from the past 48 hour(s))  CBC     Status: Abnormal   Collection Time   05/27/12  5:40 AM      Component Value Range Comment   WBC 11.6 (*) 4.0 - 10.5 K/uL    RBC 4.21  3.87 - 5.11 MIL/uL    Hemoglobin 12.4  12.0 - 15.0 g/dL    HCT 16.1  09.6 - 04.5 %    MCV 89.8  78.0 - 100.0 fL    MCH 29.5  26.0 - 34.0 pg    MCHC 32.8  30.0 - 36.0 g/dL    RDW 40.9  81.1 - 91.4 %    Platelets 152  150 - 400 K/uL   BASIC METABOLIC PANEL     Status: Abnormal   Collection Time   05/27/12  5:40 AM      Component Value Range Comment   Sodium 137  135 - 145 mEq/L    Potassium 3.7  3.5 - 5.1 mEq/L    Chloride 104  96 - 112 mEq/L    CO2 22  19 - 32 mEq/L    Glucose, Bld 132 (*) 70 - 99 mg/dL    BUN 14  6 - 23 mg/dL    Creatinine, Ser 7.82  0.50 -  1.10 mg/dL    Calcium 8.8  8.4 - 95.6 mg/dL    GFR calc non Af Amer 86 (*) >90 mL/min    GFR calc Af Amer >90  >90 mL/min   GLUCOSE, CAPILLARY     Status: Abnormal   Collection Time   05/27/12  9:07 AM      Component Value Range Comment   Glucose-Capillary 107 (*) 70 - 99 mg/dL    Comment 1 Notify RN     BASIC METABOLIC PANEL  Status: Abnormal   Collection Time   05/28/12  5:00 AM      Component Value Range Comment   Sodium 136  135 - 145 mEq/L    Potassium 4.5  3.5 - 5.1 mEq/L    Chloride 102  96 - 112 mEq/L    CO2 25  19 - 32 mEq/L    Glucose, Bld 120 (*) 70 - 99 mg/dL    BUN 16  6 - 23 mg/dL    Creatinine, Ser 1.19  0.50 - 1.10 mg/dL    Calcium 9.2  8.4 - 14.7 mg/dL    GFR calc non Af Amer 69 (*) >90 mL/min    GFR calc Af Amer 80 (*) >90 mL/min   GLUCOSE, CAPILLARY     Status: Abnormal   Collection Time   05/28/12  7:19 AM      Component Value Range Comment   Glucose-Capillary 152 (*) 70 - 99 mg/dL      HPI : 36 y/o female with PMH of narcotic abuse fell down stairs at her parents house last night while visiting. She described inversion / PF injuries bilat. She had a laceration tot he plantar aspect of the right 2nd toe that was sutured in the ED. SHe c/o pain in the right forefoot and in the left ankle and lateral border of thefoot. She denies any h/o previous injury or surgery to the feet / ankles. She c/o severe pain medially, laterally and anteriorly at the left ankle and less severe at the lateral border of the foot. More moderate pain at theright forefoot   HOSPITAL COURSE:  L 5th MT shaft fx and L ankle avulsion fxs - she can bear weight safely in a cam boot according to orthopedics. Physical therapy consultation was obtained before discharge to assess need for crutches. Her ankle appears stable regarding the syndesmosis. These fxs can be treated in closed fashion. She will followup with orthopedics in one week 2. R 2nd MT and 3rd toe fractures - continue closed  treatment.  3. Pain management Extensive history of Ambien/Flexeril/narcotic dependence History of changing providers in [place] in [place] At this point will provide Robaxin and Vicodin prescriptions only      Discharge Exam:  Blood pressure 142/88, pulse 81, temperature 97.4 F (36.3 C), resp. rate 16, SpO2 97.00%.  General: NAD, resting comfortably in bed  Eyes: PEERLA EOMI  ENT: mucous membranes moist  Neck: supple w/o JVD  Cardiovascular: RRR w/o MRG  Respiratory: CTA B  Abdomen: soft, nt, nd, bs+  Skin: no rash nor lesion  Musculoskeletal: MAE, full ROM all 4 extremities  Psychiatric: normal tone and affect  Neurologic: AAOx3, grossly non-focal          Follow-up Information    Follow up with HEWITT, Jonny Ruiz, MD. Schedule an appointment as soon as possible for a visit in 1 week.   Contact information:   [address], [address] [place] [address] [phone]       Follow up with Primary care provider. Schedule an appointment as soon as possible for a visit in 1 week.         SignedRicharda Overlie 05/28/2012, 8:21 AM

## 2012-05-28 NOTE — Telephone Encounter (Signed)
PT CALLED AND WANTS DR. Merla Riches TO KNOW THAT SHE HAS BEEN IN THE HOSPITAL AT [facility]. SHE FELL DOWN A FLIGHT OF STAIRS AND BROKE BOTH OF HER FEET AND ANKLES. SHE IS IN SEVERE  PAIN AND NEEDS STRONGER PAIN MEDS THAN WHAT THEY GAVE HER. SHE WOULD PREFER NOT TO  COME IN. PLEASE CALL.

## 2012-05-29 ENCOUNTER — Telehealth: Payer: Self-pay | Admitting: Family Medicine

## 2012-05-29 ENCOUNTER — Telehealth: Payer: Self-pay

## 2012-05-29 NOTE — Telephone Encounter (Signed)
Spoke with patient and notified her of Dr. Netta Corrigan response and states her appt with ortho is not until the end of the week. I advised her to call first thing in the am to see if they can see her sooner or prescribe her any meds until her appt, or the other option is to go to ED. Patient stated she has no way to go to ED and also has her daughter. I told her she could call EMS or have a friend or family member take her. She wanted to ow if Dr. Merla Riches could just prescribe something for a few days. I spoke with Dr. Merla Riches and he said no she has to get from ortho or go to ED. Patient notified and voiced understanding.

## 2012-05-29 NOTE — Telephone Encounter (Signed)
According to her chart we have not prescribed this medicine since going to at least Because of her history I cannot prescribe anything that is  a psychoactive drug that risks addiction Her past over-the-counter medication would help this would be Benadryl

## 2012-05-29 NOTE — Telephone Encounter (Signed)
Patient requests a small supply of xanax dues to her anxiety. She is feeling very anxious over everything going on (see previous message) [facility]

## 2012-05-29 NOTE — Telephone Encounter (Signed)
Patient called this morning and stated that she is in a lot of pain and is needing something called in for her.

## 2012-05-29 NOTE — Telephone Encounter (Signed)
Patient has called again this morning wanting to know the status of her pain medicine.

## 2012-05-29 NOTE — Telephone Encounter (Signed)
Patient notified and voiced understanding.

## 2012-05-29 NOTE — Telephone Encounter (Signed)
Call her  Unfortunately we cannot prescribe any medication for her --it must come from her orthopedist

## 2012-05-30 NOTE — Care Management Note (Signed)
    Page 1 of 2   05/30/2012     10:39:37 AM   CARE MANAGEMENT NOTE 05/30/2012  Patient:  [facility]   Account Number:  [id number]  Date Initiated:  05/28/2012  Documentation initiated by:  [facility]  Subjective/Objective Assessment:   Left ankle medial avulsion fracture  Left fifth metatarsal fracture     Action/Plan:   Anticipated DC Date:  05/28/2012   Anticipated DC Plan:  HOME W HOME HEALTH SERVICES      DC Planning Services  CM consult      [facility] Choice  HOME HEALTH   Choice offered to / List presented to:  [name]   DME arranged  [facility] Hurst      DME agency  [facility]     HH arranged  HH-2 PT  HH-3 OT      HH agency  [name]   Status of service:  Completed, signed off Medicare Important Message given?   (If response is "NO", the following Medicare IM given date fields will be blank) Date Medicare IM given:   Date Additional Medicare IM given:    Discharge Disposition:  HOME W HOME HEALTH SERVICES  Per UR Regulation:    If discussed at Long Length of Stay Meetings, dates discussed:    Comments:  05/30/2012 1020 NCM received note that pt is also requesting aide and HH RN. She is able to do anything at home. NCM contacted [name] rep, spoke to Tamala Bari RN. Will follow up with pt and scheduled for aide. Will assess for [facility] RN. NCM contacted pt and made her aware the [facility] will follow up with [facility] RN and aide. Isidoro Donning RN CCM Case Mgmt phone [phone]  05/28/2012 1400 NCM spoke to pt and offered choice for [facility]. Pt states she did not have preference for [facility]. NCM contacted [name] for [facility] PT/OT. Will fax orders to [facility]. RW delivered to room by [facility]. Isidoro Donning RN CCM Case Mgmt phone [phone]

## 2012-06-02 ENCOUNTER — Telehealth: Payer: Self-pay

## 2012-06-02 NOTE — Telephone Encounter (Signed)
Dr Merla Riches referred patient to orthopedist for broken feet. Dr Victorino Dike is not prescribing any pain meds strong enough and she is hoping Dr Merla Riches can help. She would like a week's worth of perococet.  Best [phone]

## 2012-06-03 ENCOUNTER — Telehealth: Payer: Self-pay

## 2012-06-03 NOTE — Telephone Encounter (Signed)
We didn't refer her , the ER did We will not prescribe any pain meds-narcotics for her--her treating MD is responsible for that

## 2012-06-03 NOTE — Telephone Encounter (Signed)
Left message to return call 

## 2012-06-03 NOTE — Telephone Encounter (Signed)
patient notified and voiced understanding. 

## 2012-06-03 NOTE — Telephone Encounter (Signed)
Pt states she needs 1-2 weeks rx of ambien to sleep at night since she has fallen and broken several bones.   Best phone [phone]  [facility]

## 2012-06-04 ENCOUNTER — Telehealth: Payer: Self-pay

## 2012-06-04 ENCOUNTER — Other Ambulatory Visit: Payer: Self-pay | Admitting: Internal Medicine

## 2012-06-04 NOTE — Telephone Encounter (Signed)
It does not look like pt is currently being treated with Ambien.  Given her history and that we are not treating her orthopedic injury, it is not appropriate for [place] to prescribe this for her.

## 2012-06-04 NOTE — Telephone Encounter (Signed)
The patietn called to check the status of her request for Ambien from 06/02/12.  Please return patient call at [phone].

## 2012-06-05 ENCOUNTER — Encounter: Payer: Self-pay | Admitting: *Deleted

## 2012-06-05 ENCOUNTER — Other Ambulatory Visit: Payer: Self-pay | Admitting: Internal Medicine

## 2012-06-05 ENCOUNTER — Telehealth: Payer: Self-pay

## 2012-06-05 ENCOUNTER — Emergency Department (HOSPITAL_COMMUNITY)
Admission: EM | Admit: 2012-06-05 | Discharge: 2012-06-05 | Disposition: A | Discharge status: Home / Self Care | Payer: Medicare Other | Attending: Emergency Medicine | Admitting: Emergency Medicine

## 2012-06-05 ENCOUNTER — Encounter (HOSPITAL_COMMUNITY): Payer: Self-pay | Admitting: Emergency Medicine

## 2012-06-05 DIAGNOSIS — M79609 Pain in unspecified limb: Secondary | ICD-10-CM | POA: Insufficient documentation

## 2012-06-05 DIAGNOSIS — M79606 Pain in leg, unspecified: Secondary | ICD-10-CM

## 2012-06-05 DIAGNOSIS — F17200 Nicotine dependence, unspecified, uncomplicated: Secondary | ICD-10-CM | POA: Insufficient documentation

## 2012-06-05 DIAGNOSIS — K5289 Other specified noninfective gastroenteritis and colitis: Secondary | ICD-10-CM | POA: Insufficient documentation

## 2012-06-05 DIAGNOSIS — E039 Hypothyroidism, unspecified: Secondary | ICD-10-CM | POA: Insufficient documentation

## 2012-06-05 DIAGNOSIS — M129 Arthropathy, unspecified: Secondary | ICD-10-CM | POA: Insufficient documentation

## 2012-06-05 DIAGNOSIS — Z8544 Personal history of malignant neoplasm of other female genital organs: Secondary | ICD-10-CM | POA: Insufficient documentation

## 2012-06-05 DIAGNOSIS — F411 Generalized anxiety disorder: Secondary | ICD-10-CM | POA: Insufficient documentation

## 2012-06-05 DIAGNOSIS — K529 Noninfective gastroenteritis and colitis, unspecified: Secondary | ICD-10-CM

## 2012-06-05 DIAGNOSIS — G8929 Other chronic pain: Secondary | ICD-10-CM | POA: Insufficient documentation

## 2012-06-05 DIAGNOSIS — M549 Dorsalgia, unspecified: Secondary | ICD-10-CM | POA: Insufficient documentation

## 2012-06-05 DIAGNOSIS — Z79899 Other long term (current) drug therapy: Secondary | ICD-10-CM | POA: Insufficient documentation

## 2012-06-05 DIAGNOSIS — G4730 Sleep apnea, unspecified: Secondary | ICD-10-CM | POA: Insufficient documentation

## 2012-06-05 DIAGNOSIS — R51 Headache: Secondary | ICD-10-CM | POA: Insufficient documentation

## 2012-06-05 DIAGNOSIS — K219 Gastro-esophageal reflux disease without esophagitis: Secondary | ICD-10-CM | POA: Insufficient documentation

## 2012-06-05 MED ORDER — OXYCODONE-ACETAMINOPHEN 5-325 MG PO TABS: 1.0000 | ORAL_TABLET | Freq: Once | ORAL | Status: DC

## 2012-06-05 MED ORDER — OXYCODONE-ACETAMINOPHEN 7.5-325 MG PO TABS: 1.0000 | ORAL_TABLET | ORAL | Status: DC | PRN

## 2012-06-05 MED ORDER — HYDROMORPHONE HCL PF 2 MG/ML IJ SOLN
2.0000 mg | Freq: Once | INTRAMUSCULAR | Status: AC
  Administered 2012-06-05: 2 mg via INTRAMUSCULAR
  Filled 2012-06-05: qty 1

## 2012-06-05 MED ORDER — OXYCODONE-ACETAMINOPHEN 5-325 MG PO TABS
1.0000 | ORAL_TABLET | Freq: Once | ORAL | Status: AC
  Administered 2012-06-05: 1 via ORAL
  Filled 2012-06-05: qty 1

## 2012-06-05 NOTE — ED Notes (Signed)
Pt reports she fell down a flight of stairs the end of October. Was seen in the ER 05/26/12 for the fall and was prescribed narcotic pain medicine. Reports that she is out of pain medicine and needs a refill until she sees the Orthopedist this coming Friday at 3pm. States Orthopedic office will not refill pain medicine until she is actually seen by them. Pt reates bilateral ankle pain 9/10 at this time.

## 2012-06-05 NOTE — Telephone Encounter (Signed)
See telephone note from 06/03/12.  It does not look she is currently treated with Ambien.  Given her history and that this is related to an orthopedic injury that we are not treating, it is not appropriate for [place] to fill this for her.

## 2012-06-05 NOTE — ED Notes (Signed)
Pt c/o pain to B/L ankle/feet/toes. Pt seen here 05/26/2012 for falling down stairs and was told she had broke both ankles, feet and toes. Pt reports fell again yesterday from standing position onto floor. Pt requesting pain medication.

## 2012-06-05 NOTE — Telephone Encounter (Signed)
Advised pt that we cannot rx med for her

## 2012-06-05 NOTE — ED Provider Notes (Signed)
History  This chart was scribed for Gwyneth Sprout, MD by Erskine Emery. This patient was seen in room TR06C/TR06C and the patient's care was started at 15:14.   CSN: [id number]  Arrival date & time 06/05/12  1439   First MD Initiated Contact with Patient 06/05/12 1514      Chief Complaint  Patient presents with  . Leg Pain    (Consider location/radiation/quality/duration/timing/severity/associated sxs/prior treatment) The history is provided by the patient. No language interpreter was used.  [name] is a 36 y.o. female who presents to the Emergency Department complaining of gradually worsening shooting pain to bilateral ankles, feet, and toes for the past few weeks. Pt denies any associated numbness or tingling. Pt reports she was seen here October 31st (11 days ago) after she fell down a flight of stairs. Then yesterday, she fell again when her walker slipped. Pt reports she is out of her pain medication so she has been using ibuprofen which only mildly relieves the pain. Pt reports she has an appointment with a specialist this coming Friday.  Past Medical History  Diagnosis Date  . Narcotic abuse in remission   . Thyroid disease   . Chronic back pain   . Hypothyroidism   . Anxiety   . GERD (gastroesophageal reflux disease)   . Sleep apnea     cpap  . Headache   . Neuromuscular disorder     neuropathy  . Cancer     vaginal  malignant carcinoma  . Arthritis     knees bilateral   DJD  stenosis   . Blood dyscrasia     HSV    Past Surgical History  Procedure Date  . Back surgery   . Cesarean section   . Tonsillectomy   . Tubal ligation     No family history on file.  History  Substance Use Topics  . Smoking status: Current Every [place] Smoker -- 1.0 packs/[place] for 17 years    Types: Cigarettes  . Smokeless tobacco: Never Used  . Alcohol Use: No    OB History    Grav Para Term Preterm Abortions TAB SAB Ect Mult Living                  Review of Systems    Constitutional: Negative for fever and chills.  HENT: Positive for neck pain.   Respiratory: Negative for shortness of breath.   Gastrointestinal: Positive for abdominal pain. Negative for nausea and vomiting.  Musculoskeletal: Positive for back pain, joint swelling and arthralgias.  Skin: Negative for rash.  Neurological: Negative for weakness.    Allergies  Avelox; Levofloxacin; Penicillins; and Quinolones  Home Medications   Current Outpatient Rx  Name  Route  Sig  Dispense  Refill  . IBUPROFEN 800 MG PO TABS   Oral   Take 800 mg by mouth every 8 (eight) hours as needed. For pain         . LEVOTHYROXINE SODIUM 50 MCG PO TABS   Oral   Take 1 tablet (50 mcg total) by mouth daily.   90 tablet   1   . OXYCODONE-ACETAMINOPHEN 7.5-325 MG PO TABS   Oral   Take 1 tablet by mouth every 8 (eight) hours as needed.         Marland Kitchen PAROXETINE HCL 40 MG PO TABS   Oral   Take 40 mg by mouth every morning.         Marland Kitchen PREGABALIN 150 MG PO CAPS  Oral   Take 150 mg by mouth 3 (three) times daily.         . TRAZODONE HCL 100 MG PO TABS   Oral   Take 2 tablets (200 mg total) by mouth at bedtime.   180 tablet   1     BP 121/72  Pulse 94  Temp 99.2 F (37.3 C) (Oral)  Resp 18  SpO2 96%  Physical Exam  Nursing note and vitals reviewed. Constitutional: She is oriented to person, place, and time. She appears well-developed and well-nourished. No distress.  HENT:  Head: Normocephalic and atraumatic.  Eyes: EOM are normal. Pupils are equal, round, and reactive to light.  Neck: Neck supple. No tracheal deviation present.  Cardiovascular: Normal rate, regular rhythm and normal heart sounds.        Normal distal pulses.  Pulmonary/Chest: Effort normal and breath sounds normal. No respiratory distress.  Abdominal: Soft. She exhibits no distension. There is tenderness.       LLQ abdominal pain.  Musculoskeletal: Normal range of motion. She exhibits no edema.       Bilateral  foot ecchymosis and pain. Healing echymosis over the right knee.  Neurological: She is alert and oriented to person, place, and time.  Skin: Skin is warm and dry.  Psychiatric: She has a normal mood and affect.    ED Course  Procedures (including critical care time) DIAGNOSTIC STUDIES: Oxygen Saturation is 96% on room air, adequate by my interpretation.    COORDINATION OF CARE: 15:35--I evaluated the patient and we discussed a treatment plan including pain medication to which the pt agreed.    Labs Reviewed - No data to display No results found.   1. Leg pain   2. Colitis       MDM   Patient is here requesting pain medication. She recently 2 weeks ago fell down the stairs breaking both ankles and states when using her walker yesterday she slipped and fell. She said most of the fall was to her right knee which she states is fine and she is able to bend with minimal pain. Also she states she has a history of colitis which is ongoing she has persistent left lower cautery pain. She states all of this is at its baseline but magnified because she's out of her pain medication. She is here requesting pain medication. Patient was given pain medication and discharged home.      I personally performed the services described in this documentation, which was scribed in my presence.  The recorded information has been reviewed and considered.    Gwyneth Sprout, MD 06/05/12 [phone]

## 2012-06-06 ENCOUNTER — Telehealth: Payer: Self-pay

## 2012-06-06 NOTE — Telephone Encounter (Signed)
Patient Active Problem List  Diagnosis  . Narcotic abuse in remission  . Chronic back pain  . Obesity, Class III, BMI 40-49.9 (morbid obesity)  . Mood disorder  . Hypothyroidism  . Obstructive sleep apnea  . Fall down stairs  . Foot fracture  With the above problems we cannot be in a position of prescribing medicines over the phone She will need to receive medicines that relate to her mood disorder and her narcotic abuse from a mental health physician Her pain meds naturally we'll need to come from the person responsible for treating the accident So we cannot prescribe something for sleep based on her anxiety

## 2012-06-06 NOTE — Telephone Encounter (Signed)
Patient called wanting to leave message for Dr. Merla Riches. She has called several times and has been told no but called earlier stating she asked her ortho about prescribing ambien and he said her primary needs to be the one to do that. I spoke with Dr. Merla Riches and he still says no. She can take Benadryl. Patient notified and voiced understanding. I explained previous message also.

## 2012-06-06 NOTE — Telephone Encounter (Signed)
There are multiple messages about this, not appropriate to treat her with Ambien. This is not a good medication for her long term, I have advised her of this in the past also. Please advise what you would prefer to do in this instance. She states she is not sleeping. Amy I left message for her to call me back to discuss.

## 2012-06-06 NOTE — Telephone Encounter (Signed)
PT STATES SHE HAD GONE TO SEE HER ORTH AND WAS TOLD HE WOULD GIVE HER ANY AMBIEN FOR TO HELP HER SLEEP, STATES HER PCP SHOULD BE THE ONE TO DO THAT ISN'T SLEEPING BECAUSE SHE IS UNDER A LOT OF STRESS. PLEASE CALL [phone]   [facility]

## 2012-06-10 ENCOUNTER — Telehealth: Payer: Self-pay

## 2012-06-10 NOTE — Telephone Encounter (Signed)
She is not able to take Xanax because of her history. She's not allergic to medication she could try some Benadryl and hopefully that will help her relax a little bit

## 2012-06-10 NOTE — Telephone Encounter (Signed)
Patient states she is having a panic attack and would like to know if she can get some Xanax, she had a recent injury and broke bones in both feet. She is advised Dr Merla Riches is out of the office today, she wants Dr Cleta Alberts to advise on her Rx request. Please advise. Ph # for her is [phone]

## 2012-06-10 NOTE — Telephone Encounter (Signed)
Thanks, I have advised her of this and she states she can not take Benadryl. I am not sure what else to advise her.

## 2012-06-12 ENCOUNTER — Encounter (HOSPITAL_COMMUNITY): Payer: Self-pay | Admitting: Emergency Medicine

## 2012-06-12 ENCOUNTER — Emergency Department (HOSPITAL_COMMUNITY): Payer: Medicare Other

## 2012-06-12 ENCOUNTER — Emergency Department (HOSPITAL_COMMUNITY)
Admission: EM | Admit: 2012-06-12 | Discharge: 2012-06-12 | Disposition: A | Discharge status: Home / Self Care | Payer: Medicare Other | Attending: Emergency Medicine | Admitting: Emergency Medicine

## 2012-06-12 DIAGNOSIS — Y92009 Unspecified place in unspecified non-institutional (private) residence as the place of occurrence of the external cause: Secondary | ICD-10-CM | POA: Insufficient documentation

## 2012-06-12 DIAGNOSIS — S8253XA Displaced fracture of medial malleolus of unspecified tibia, initial encounter for closed fracture: Secondary | ICD-10-CM | POA: Insufficient documentation

## 2012-06-12 DIAGNOSIS — E039 Hypothyroidism, unspecified: Secondary | ICD-10-CM | POA: Insufficient documentation

## 2012-06-12 DIAGNOSIS — Z8719 Personal history of other diseases of the digestive system: Secondary | ICD-10-CM | POA: Insufficient documentation

## 2012-06-12 DIAGNOSIS — Z8544 Personal history of malignant neoplasm of other female genital organs: Secondary | ICD-10-CM | POA: Insufficient documentation

## 2012-06-12 DIAGNOSIS — Z8669 Personal history of other diseases of the nervous system and sense organs: Secondary | ICD-10-CM | POA: Insufficient documentation

## 2012-06-12 DIAGNOSIS — F1111 Opioid abuse, in remission: Secondary | ICD-10-CM | POA: Insufficient documentation

## 2012-06-12 DIAGNOSIS — R51 Headache: Secondary | ICD-10-CM | POA: Insufficient documentation

## 2012-06-12 DIAGNOSIS — F17200 Nicotine dependence, unspecified, uncomplicated: Secondary | ICD-10-CM | POA: Insufficient documentation

## 2012-06-12 DIAGNOSIS — W19XXXA Unspecified fall, initial encounter: Secondary | ICD-10-CM

## 2012-06-12 DIAGNOSIS — G8929 Other chronic pain: Secondary | ICD-10-CM | POA: Insufficient documentation

## 2012-06-12 DIAGNOSIS — E669 Obesity, unspecified: Secondary | ICD-10-CM | POA: Insufficient documentation

## 2012-06-12 DIAGNOSIS — W010XXA Fall on same level from slipping, tripping and stumbling without subsequent striking against object, initial encounter: Secondary | ICD-10-CM | POA: Insufficient documentation

## 2012-06-12 DIAGNOSIS — Y9301 Activity, walking, marching and hiking: Secondary | ICD-10-CM | POA: Insufficient documentation

## 2012-06-12 DIAGNOSIS — Z8739 Personal history of other diseases of the musculoskeletal system and connective tissue: Secondary | ICD-10-CM | POA: Insufficient documentation

## 2012-06-12 DIAGNOSIS — S92309A Fracture of unspecified metatarsal bone(s), unspecified foot, initial encounter for closed fracture: Secondary | ICD-10-CM | POA: Insufficient documentation

## 2012-06-12 DIAGNOSIS — Z79899 Other long term (current) drug therapy: Secondary | ICD-10-CM | POA: Insufficient documentation

## 2012-06-12 DIAGNOSIS — Z8619 Personal history of other infectious and parasitic diseases: Secondary | ICD-10-CM | POA: Insufficient documentation

## 2012-06-12 DIAGNOSIS — Z8659 Personal history of other mental and behavioral disorders: Secondary | ICD-10-CM | POA: Insufficient documentation

## 2012-06-12 MED ORDER — OXYCODONE-ACETAMINOPHEN 5-325 MG PO TABS
1.0000 | ORAL_TABLET | Freq: Once | ORAL | Status: AC
  Administered 2012-06-12: 1 via ORAL
  Filled 2012-06-12: qty 1

## 2012-06-12 MED ORDER — TRAMADOL HCL 50 MG PO TABS: 50.0000 mg | ORAL_TABLET | Freq: Four times a day (QID) | ORAL | Status: DC | PRN

## 2012-06-12 MED ORDER — HYDROMORPHONE HCL PF 1 MG/ML IJ SOLN
1.0000 mg | Freq: Once | INTRAMUSCULAR | Status: AC
  Administered 2012-06-12: 1 mg via INTRAMUSCULAR
  Filled 2012-06-12: qty 1

## 2012-06-12 NOTE — ED Notes (Signed)
Discharge instructions reviewed. Pt verbalized understanding.  

## 2012-06-12 NOTE — ED Provider Notes (Signed)
MSE was initiated and I personally evaluated the patient and placed orders (if any) at  2:51 PM on June 12, 2012.  The patient appears stable so that the remainder of the MSE may be completed by another provider.  Pt w re-fall injury to already fractured left ankle, repeat imaging and pain medications ordered  Jaci Carrel, PA-C 06/12/12 1452

## 2012-06-12 NOTE — ED Provider Notes (Signed)
History     CSN: [id number]  Arrival date & time 06/12/12  1309   First MD Initiated Contact with Patient 06/12/12 1500      Chief Complaint  Patient presents with  . Fall    (Consider location/radiation/quality/duration/timing/severity/associated sxs/prior treatment) HPI The patient presents with left ankle and foot pain following a fall.  Notably, the patient had another fall 2 weeks ago, that resulted in fractures of her left fifth metatarsal, the distal left tibia, the right third and fourth metatarsals.  She was discharged with postoperative shoe on the right, in a Cam Walker on the left.  She states that today she was walking without her Cam Walker, slipped, fell onto her left knee, and bent her left ankle awkwardly.  Since the fall she has had pain in her left ankle and foot.  She denies ongoing knee pain, back pain, any other focal complaints.  She did not take any medication at home for her foot pain, but think that since arrival, and provision of one Percocet she continues to have pain.  The pain is worse with motion.  She denies distal numbness or weakness. Past Medical History  Diagnosis Date  . Narcotic abuse in remission   . Thyroid disease   . Chronic back pain   . Hypothyroidism   . Anxiety   . GERD (gastroesophageal reflux disease)   . Sleep apnea     cpap  . Headache   . Neuromuscular disorder     neuropathy  . Cancer     vaginal  malignant carcinoma  . Arthritis     knees bilateral   DJD  stenosis   . Blood dyscrasia     HSV    Past Surgical History  Procedure Date  . Back surgery   . Cesarean section   . Tonsillectomy   . Tubal ligation   . Fracture surgery     History reviewed. No pertinent family history.  History  Substance Use Topics  . Smoking status: Current Every Donald Smoker -- 1.0 packs/Crill for 17 years    Types: Cigarettes  . Smokeless tobacco: Never Used  . Alcohol Use: No    OB History    Grav Para Term Preterm Abortions TAB SAB  Ect Mult Living                  Review of Systems  All other systems reviewed and are negative.    Allergies  Avelox; Levofloxacin; Penicillins; and Quinolones  Home Medications   Current Outpatient Rx  Name  Route  Sig  Dispense  Refill  . LEVOTHYROXINE SODIUM 50 MCG PO TABS   Oral   Take 1 tablet (50 mcg total) by mouth daily. Needs office visit   30 tablet   0   . PAROXETINE HCL 40 MG PO TABS   Oral   Take 40 mg by mouth every morning.         Marland Kitchen PREGABALIN 150 MG PO CAPS   Oral   Take 150 mg by mouth 3 (three) times daily.         . TRAMADOL HCL 50 MG PO TABS   Oral   Take 50-100 mg by mouth every 6 (six) hours as needed. For pain           BP 122/75  Pulse 109  Temp 98.4 F (36.9 C) (Oral)  Resp 22  SpO2 96%  LMP 06/09/2012  Physical Exam  Nursing note and vitals reviewed.  Constitutional: She is oriented to person, place, and time. She appears well-developed and well-nourished. No distress.  HENT:  Head: Normocephalic and atraumatic.  Eyes: Conjunctivae normal are normal. Right eye exhibits no discharge. Left eye exhibits no discharge.  Neck: No tracheal deviation present.  Cardiovascular: Normal rate and regular rhythm.   Pulmonary/Chest: No stridor. No respiratory distress.  Musculoskeletal:       Left knee: Normal.       Feet:       The patient can flex and extend the left ankle, though she is in pain with both of these actions.  Distal pulses are appropriate.  Cap refill is appropriate.  Neurological: She is alert and oriented to person, place, and time. No cranial nerve deficit.  Skin: Skin is warm and dry. She is not diaphoretic.  Psychiatric: She has a normal mood and affect.    ED Course  Procedures (including critical care time)  Labs Reviewed - No data to display Dg Ankle Complete Left  06/12/2012  *RADIOLOGY REPORT*  Clinical Data: Ankle pain.  LEFT ANKLE COMPLETE - 3+ VIEW  Comparison: 05/27/2012.  Findings: Again noted are  tiny bony fragments adjacent to the tip of the medial malleolus, suspicious or subacute avulsion fractures. The previously noted suspected avulsion fracture from the lateral aspect of the talus are no longer clearly identified (this may be projectional).  No new acute fracture is identified.  Ankle mortise is preserved.  Overlying soft tissues appear swollen.  Spiral fracture through the fifth metatarsal is again noted.  IMPRESSION: 1.  Soft tissue swelling around the ankle joint with a small avulsion fracture from the tip of the medial malleolus (subacute). Previously noted avulsion fracture from the lateral aspect of the talus and is no longer clearly identified (this is likely projectional). 2.  Spiral fracture of the fifth metatarsal redemonstrated. 3.  No definite new bony injury is identified.   Original Report Authenticated By: Trudie Reed, M.D.      No diagnosis found.  I reviewed the patient's chart, note that there are several mentions of her history of narcotic abuse, concern for obtaining prescriptions from multiple care providers.  MDM  This obese female presents following a fall.  Notably, the patient was not wearing her previously provided a Cam Walker for an existing fracture.  On exam she is in no distress.  The patient is mildly tachycardic, but otherwise has unremarkable vital signs.  The patient is neurologically intact, but has significant tenderness.  I discussed the patient's history of narcotic abuse, her needed to follow up with her primary care physician and her orthopedist for additional analgesics.  I discussed the necessity of following instructions for wound care as well.  Absent distress, and w no new Fx, the patient was d/c.   Gerhard Munch, MD 06/12/12 [phone]

## 2012-06-12 NOTE — ED Notes (Signed)
Physician and PA made aware about pt's pain, no orders received at this time.

## 2012-06-12 NOTE — ED Notes (Addendum)
Per EMS pt had a fall on 05/26/12 and fractured her left ankle and foot, as well as her rt foot. Today she was walking around her house with her walker without her boot and slipped and fell again and c/o worsening pain in her left ankle and foot. Rates pain a 9/10. Pt states "Feels crunchy when I walk".

## 2012-06-15 NOTE — ED Provider Notes (Signed)
Medical screening examination/treatment/procedure(s) were performed by non-physician practitioner and as supervising physician I was immediately available for consultation/collaboration.  Dezyrae Kensinger M Aleksandra Raben, MD 06/15/12 2337 

## 2012-06-16 ENCOUNTER — Telehealth: Payer: Self-pay

## 2012-06-16 NOTE — Telephone Encounter (Signed)
[name] FROM [name] WANT TO MAKE SURE THE DR KNOWS THAT THEY CAN GIVE HER SOMETHING FOR PAIN, BUT SHE DOESN'T NEED THAT, SHE NEED SOMETHING FOR ANXIETY INSTEAD. [name] WANTED [place] TO CALL [name] AND LET HER KNOW SHE NEED TO MAKE AN APPT WITH [place] SO SHE CAN GET ON SOME MEDICINE PLEASE CALL [phone] IF NEEDED, BUT SHE WOULD PREFER WE CALL PT INSTEAD

## 2012-06-16 NOTE — Telephone Encounter (Signed)
Patient previously advised we can not do this, [name]

## 2012-07-30 ENCOUNTER — Ambulatory Visit (INDEPENDENT_AMBULATORY_CARE_PROVIDER_SITE_OTHER): Payer: Medicare Other | Admitting: Internal Medicine

## 2012-07-30 VITALS — BP 122/88 | HR 111 | Temp 98.7°F | Resp 20 | Ht 67.0 in | Wt 323.0 lb

## 2012-07-30 DIAGNOSIS — G4700 Insomnia, unspecified: Secondary | ICD-10-CM

## 2012-07-30 DIAGNOSIS — M549 Dorsalgia, unspecified: Secondary | ICD-10-CM

## 2012-07-30 DIAGNOSIS — E039 Hypothyroidism, unspecified: Secondary | ICD-10-CM

## 2012-07-30 DIAGNOSIS — J329 Chronic sinusitis, unspecified: Secondary | ICD-10-CM

## 2012-07-30 DIAGNOSIS — F17200 Nicotine dependence, unspecified, uncomplicated: Secondary | ICD-10-CM | POA: Insufficient documentation

## 2012-07-30 DIAGNOSIS — G4733 Obstructive sleep apnea (adult) (pediatric): Secondary | ICD-10-CM

## 2012-07-30 DIAGNOSIS — F1111 Opioid abuse, in remission: Secondary | ICD-10-CM

## 2012-07-30 DIAGNOSIS — F39 Unspecified mood [affective] disorder: Secondary | ICD-10-CM

## 2012-07-30 LAB — TSH: TSH: 5.556 u[IU]/mL — ABNORMAL HIGH (ref 0.350–4.500)

## 2012-07-30 LAB — POCT CBC
HCT, POC: 42.6 % (ref 37.7–47.9)
Hemoglobin: 13.5 g/dL (ref 12.2–16.2)
MCH, POC: 29.9 pg (ref 27–31.2)
MPV: 12.3 fL (ref 0–99.8)
POC MID %: 6.2 %M (ref 0–12)
RBC: 4.52 M/uL (ref 4.04–5.48)
WBC: 8.5 10*3/uL (ref 4.6–10.2)

## 2012-07-30 LAB — COMPREHENSIVE METABOLIC PANEL
ALT: 12 U/L (ref 0–35)
CO2: 32 mEq/L (ref 19–32)
Calcium: 9.1 mg/dL (ref 8.4–10.5)
Chloride: 104 mEq/L (ref 96–112)
Creat: 1.09 mg/dL (ref 0.50–1.10)
Glucose, Bld: 85 mg/dL (ref 70–99)
Sodium: 141 mEq/L (ref 135–145)
Total Protein: 6.2 g/dL (ref 6.0–8.3)

## 2012-07-30 MED ORDER — ZOLPIDEM TARTRATE 10 MG PO TABS: 10.0000 mg | ORAL_TABLET | Freq: Every evening | ORAL | Status: DC | PRN

## 2012-07-30 MED ORDER — PREGABALIN 200 MG PO CAPS: 200.0000 mg | ORAL_CAPSULE | Freq: Three times a day (TID) | ORAL | Status: DC

## 2012-07-30 MED ORDER — DOXYCYCLINE HYCLATE 100 MG PO TABS: 100.0000 mg | ORAL_TABLET | Freq: Two times a day (BID) | ORAL | Status: DC

## 2012-07-30 MED ORDER — PAROXETINE HCL 40 MG PO TABS: 40.0000 mg | ORAL_TABLET | ORAL | Status: DC

## 2012-07-30 NOTE — Telephone Encounter (Signed)
error 

## 2012-07-30 NOTE — Progress Notes (Addendum)
Subjective:    Patient ID: [name], female    DOB: April 04, 1976, 37 y.o.   MRN: [id number]  HPI here for 2 main problems in addition a refill of chronic medications 1) can't sleep-this has been an issue for years. She has failed to respond to amitriptyline 300 mg, trazodone 150 mg, Seroquel was too expensive, melatonin and over-the-counter medicines. She has responded in the past Ambien. She has obstructive sleep apnea and uses a CPAP although she takes it off in the middle of the night. She had marked oxygen desaturation when first evaluated. Her level of obesity has not changed. She cannot remember who did her original evaluation and treatment. It has been many years since her condition was reevaluated  2)Congestion/hoarseness-on and off for 2 months/over the past week has had increased purulent discharge and face pressure with sinus headaches/no fever/no cough/no sore throat  Chronic problems include chronic back pain which lead to narcotic addiction and abuse. 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 Generalized anxiety disorder. Mood disorder. Divorced. Family dysfunction. She has now been sober for 15 months following detox and living in a sober living community in [place] in the aftermath. She has her apartment and part-time custody of her daughter who is 5. She is improving her relationship with her parents. Although she continues to smoke she hopes to stop in 2014. She also plans to begin a weight loss program. 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  Chronic medications include Paxil/Lyrica/and Synthroid Review of Systems No headaches No weight loss/fevers/night sweats/new fatigue   no chest pain palpitations or shortness of breath No peripheral edema No GI or GU symptoms Recent injury to both extremities involving fractures after a fall are now healed-Dr. Victorino Dike Denies depression/feels medication is  helping/denies anxiety/denies self injury Denies use of any illegal or other prescribed medications/was able to use 3 weeks of narcotics as treatment for her recent injury without trouble. Objective:   Physical Exam Morbid obesity Blood pressure normal TMs clear Nares with purulent discharge/tender right maxillary sinus to percussion Throat clear No nodes Chest clear to auscultation       Assessment & Plan:  Problem #1 insomnia Ambien 10 mg for 15 straight days Problem #2 obstructive sleep apnea Referral back to sleep specialist for reevaluation Problem #3 hypothyroidism Check labs and refill medicines in response Problem #4 chronic back pain Refill Lyrica Problem #5 mood disorder with anxiety and depression Refill Paxil Problem #6 morbid obesity Discussed exercise/diet/involvement in weight loss management program at [facility] Problem #7 nicotine addiction Discussed cessation Problem #8 narcotic abuse in remission Reinforced her positive choices Problem #9 acute sinusitis with multiple antibiotic allergies Doxycycline 100 twice a [place] for 10 days  Meds ordered this encounter  Medications         . doxycycline (VIBRA-TABS) 100 MG tablet    Sig: Take 1 tablet (100 mg total) by mouth 2 (two) times daily.    Dispense:  20 tablet    Refill:  0  . pregabalin (LYRICA) 200 MG capsule    Sig: Take 1 capsule (200 mg total) by mouth 3 (three) times daily.    Dispense:  270 capsule    Refill:  3  . PARoxetine (PAXIL) 40 MG tablet    Sig: Take 1 tablet (40 mg total) by mouth every morning.    Dispense:  90 tablet    Refill:  3  . zolpidem (AMBIEN) 10 MG tablet  Sig: Take 1 tablet (10 mg total) by mouth at bedtime as needed for sleep.    Dispense:  15 tablet    Refill:  1   OV Labs pending    08/02/12 T3,T4 WNL///TSH sl high Continue 49mcg/sent

## 2012-07-31 ENCOUNTER — Telehealth: Payer: Self-pay

## 2012-07-31 NOTE — Telephone Encounter (Signed)
Pt called stated given RX Ambien which helps her fall asleep but not stay asleep. Pt stated she is still waking up every hour. Pt did  Research and believes she need RX Ambien C R . Can this be called in? Please let pt know. [phone]. [facility] preferred

## 2012-07-31 NOTE — Telephone Encounter (Signed)
TO LET  DOOLITTLE TO PLEASE HAVE HIM WRITE A RX FOR AMBIEN CR AND IF HE WOULD HAVE HER TAKE A 10MG  BEFORE SLEEP OR MIDDLE OF SLEEP. PATIENT HAS CALLED SEVERAL TIMES TODAY ALREADY.

## 2012-08-01 NOTE — Telephone Encounter (Signed)
Patient has called multiple times for Ambien CR.

## 2012-08-02 ENCOUNTER — Encounter: Payer: Self-pay | Admitting: Internal Medicine

## 2012-08-02 MED ORDER — LEVOTHYROXINE SODIUM 50 MCG PO TABS: 50.0000 ug | ORAL_TABLET | Freq: Every day | ORAL | Status: DC

## 2012-08-02 NOTE — Telephone Encounter (Signed)
Patient would have to return her Ambien in order to receive a prescription for Ambien CR If she does this, then Ambien CR 12.5 mg #15 1 hs 1 refill after 09/01/12

## 2012-08-02 NOTE — Telephone Encounter (Signed)
Patient will bring this in and ask for me or Britta Mccreedy.

## 2012-08-02 NOTE — Addendum Note (Signed)
Addended by: Tonye Pearson on: 08/02/2012 09:44 PM   Modules accepted: Orders

## 2012-08-07 ENCOUNTER — Telehealth: Payer: Self-pay

## 2012-08-07 NOTE — Telephone Encounter (Signed)
Patient wants to add that she did some research about her increased TSH levels. States that increases TSH levels means underactive thyroid and she wants to know is she needs to increase her synthroid? States she the levels 0.5 and 3.0 doesn't concur with the ranges given with bloodwork but she wants to know if she needs to increase her synthroid. Please call back at [phone].

## 2012-08-07 NOTE — Telephone Encounter (Addendum)
Dr  Merla Riches   Patient states her TSA levels are above normal and wants to know if this is the cause for her lack of sleep.   CBN:  [phone] (H)  Patient called again and wants the dr. To know she was reading on line that the underactive Thyroid causes sleep apnea, weight gain plus a number of other ailments.

## 2012-08-09 NOTE — Telephone Encounter (Signed)
FYI, Patient has not brought in this Rx.

## 2012-08-11 ENCOUNTER — Telehealth: Payer: Self-pay

## 2012-08-11 NOTE — Telephone Encounter (Signed)
Agree - we would need to evaluate this before referring to surgeon. It is possible that it is an abscess that we could drain here that may not require surgery. The best practice would for [place] to evaluate this first.

## 2012-08-11 NOTE — Telephone Encounter (Signed)
PT STATES SHE NEED AN URGENT REFERRAL FOR A SURGEON BY TOMORROW BECAUSE SHE HAVE BEEN DIAGNOSED WITH HIDRADENITIS SUPPURATIVE AND IT IS HUGH CANNOT WAIT UNTIL Monday. PLEASE CALL [phone]

## 2012-08-11 NOTE — Telephone Encounter (Signed)
Patient returned call and stated Dr. Londell Moh is going to try and set up appt with surgeon for her.

## 2012-08-11 NOTE — Telephone Encounter (Signed)
Left message for patient to return call.

## 2012-08-11 NOTE — Telephone Encounter (Signed)
Patient called back and stated she had talked to Dr. Amy Swaziland (dermatologist) and she told her to call her PCP and have them set up ref to surgeon for hidradenitis suppurativa. I asked her why Dr. Swaziland did not set up ref for her because since she seen her then she should've set that up. patient said she didn't see her she had talked to her on the phone. I explained she would need to come in for evaluation before we could refer her. She said that she does not have any way to come in because she doesn't have a car. Her parents said they could take her to surgeon tomorrow. She said she was going to call Dr. Swaziland to see if she could do referral. And she still wanted me to send message to a provider to see if referral can be done. I still explained this is not appropriate and she we would need to eval before we set up a ref. She stated tomorrw is Friday so she didmt know how she could come see [place] if she had a ride and see surgeon tomorrow because she cant wait until next week. I advised she could go to ER if that bad.

## 2012-08-12 ENCOUNTER — Other Ambulatory Visit: Payer: Self-pay | Admitting: Family Medicine

## 2012-08-12 ENCOUNTER — Other Ambulatory Visit (INDEPENDENT_AMBULATORY_CARE_PROVIDER_SITE_OTHER): Payer: Self-pay | Admitting: Surgery

## 2012-08-12 DIAGNOSIS — N6311 Unspecified lump in the right breast, upper outer quadrant: Secondary | ICD-10-CM

## 2012-08-12 DIAGNOSIS — N644 Mastodynia: Secondary | ICD-10-CM

## 2012-08-14 ENCOUNTER — Telehealth: Payer: Self-pay

## 2012-08-14 NOTE — Telephone Encounter (Signed)
PT HAS HURT HER BACK; WANTS DOOLITTLE TO CALL IN RX FOR FLEXERIL; DOES NOT HAVE CAR SO SHE CANNOT COME IN TO OFFICE TO BE SEEN...Marland KitchenMarland Kitchen

## 2012-08-15 MED ORDER — CYCLOBENZAPRINE HCL 10 MG PO TABS: 10.0000 mg | ORAL_TABLET | Freq: Three times a day (TID) | ORAL | Status: DC | PRN

## 2012-08-15 NOTE — Telephone Encounter (Signed)
Ok Meds ordered this encounter  Medications  . cyclobenzaprine (FLEXERIL) 10 MG tablet    Sig: Take 1 tablet (10 mg total) by mouth 3 (three) times daily as needed for muscle spasms.    Dispense:  30 tablet    Refill:  0

## 2012-08-15 NOTE — Telephone Encounter (Signed)
Called her to advise. She wants it sent to [facility] not [facility], I cancelled at [employer], [facility]. Patient advised.

## 2012-08-20 ENCOUNTER — Telehealth: Payer: Self-pay | Admitting: Family Medicine

## 2012-08-20 NOTE — Telephone Encounter (Signed)
Patient called and wanted medicine she woke up with bumps on lips i told her that she need to come into office to be seen patient understands

## 2012-08-23 ENCOUNTER — Other Ambulatory Visit: Payer: Medicare Other

## 2012-08-26 ENCOUNTER — Institutional Professional Consult (permissible substitution): Payer: Medicare Other | Admitting: Internal Medicine

## 2012-08-31 ENCOUNTER — Other Ambulatory Visit: Payer: Medicare Other

## 2012-09-13 ENCOUNTER — Ambulatory Visit
Admission: RE | Admit: 2012-09-13 | Discharge: 2012-09-13 | Disposition: A | Discharge status: Home / Self Care | Payer: Medicare Other | Source: Ambulatory Visit | Attending: Family Medicine | Admitting: Family Medicine

## 2012-09-13 DIAGNOSIS — N644 Mastodynia: Secondary | ICD-10-CM

## 2012-09-13 DIAGNOSIS — N6311 Unspecified lump in the right breast, upper outer quadrant: Secondary | ICD-10-CM

## 2012-09-29 ENCOUNTER — Institutional Professional Consult (permissible substitution): Payer: Medicare Other | Admitting: Internal Medicine

## 2012-10-04 ENCOUNTER — Telehealth: Payer: Self-pay

## 2012-10-04 DIAGNOSIS — G4700 Insomnia, unspecified: Secondary | ICD-10-CM

## 2012-10-04 MED ORDER — ZOLPIDEM TARTRATE 10 MG PO TABS: 10.0000 mg | ORAL_TABLET | Freq: Every evening | ORAL | Status: DC | PRN

## 2012-10-04 NOTE — Telephone Encounter (Signed)
Meds ordered this encounter  Medications   zolpidem (AMBIEN) 10 MG tablet    Sig: Take 1 tablet (10 mg total) by mouth at bedtime as needed for sleep.    Dispense:  15 tablet    Refill:  1    

## 2012-10-04 NOTE — Telephone Encounter (Signed)
Patient requesting Remus Loffler ([facility]) [facility] [address]   [phone]

## 2012-10-05 ENCOUNTER — Telehealth: Payer: Self-pay | Admitting: Radiology

## 2012-10-06 NOTE — Telephone Encounter (Signed)
Patient advised meds sent in for her.  

## 2012-10-16 ENCOUNTER — Other Ambulatory Visit: Payer: Self-pay | Admitting: Internal Medicine

## 2012-10-17 ENCOUNTER — Encounter (HOSPITAL_COMMUNITY): Payer: Self-pay | Admitting: *Deleted

## 2012-10-17 ENCOUNTER — Emergency Department (HOSPITAL_COMMUNITY)
Admission: EM | Admit: 2012-10-17 | Discharge: 2012-10-17 | Disposition: A | Discharge status: Home / Self Care | Payer: Medicare Other | Attending: Emergency Medicine | Admitting: Emergency Medicine

## 2012-10-17 ENCOUNTER — Telehealth: Payer: Self-pay

## 2012-10-17 DIAGNOSIS — E039 Hypothyroidism, unspecified: Secondary | ICD-10-CM | POA: Insufficient documentation

## 2012-10-17 DIAGNOSIS — F17200 Nicotine dependence, unspecified, uncomplicated: Secondary | ICD-10-CM | POA: Insufficient documentation

## 2012-10-17 DIAGNOSIS — Z8679 Personal history of other diseases of the circulatory system: Secondary | ICD-10-CM | POA: Insufficient documentation

## 2012-10-17 DIAGNOSIS — E1142 Type 2 diabetes mellitus with diabetic polyneuropathy: Secondary | ICD-10-CM | POA: Insufficient documentation

## 2012-10-17 DIAGNOSIS — G4730 Sleep apnea, unspecified: Secondary | ICD-10-CM | POA: Insufficient documentation

## 2012-10-17 DIAGNOSIS — Z862 Personal history of diseases of the blood and blood-forming organs and certain disorders involving the immune mechanism: Secondary | ICD-10-CM | POA: Insufficient documentation

## 2012-10-17 DIAGNOSIS — Z79899 Other long term (current) drug therapy: Secondary | ICD-10-CM | POA: Insufficient documentation

## 2012-10-17 DIAGNOSIS — F411 Generalized anxiety disorder: Secondary | ICD-10-CM | POA: Insufficient documentation

## 2012-10-17 DIAGNOSIS — Z8544 Personal history of malignant neoplasm of other female genital organs: Secondary | ICD-10-CM | POA: Insufficient documentation

## 2012-10-17 DIAGNOSIS — Z8719 Personal history of other diseases of the digestive system: Secondary | ICD-10-CM | POA: Insufficient documentation

## 2012-10-17 DIAGNOSIS — M1990 Unspecified osteoarthritis, unspecified site: Secondary | ICD-10-CM | POA: Insufficient documentation

## 2012-10-17 DIAGNOSIS — F410 Panic disorder [episodic paroxysmal anxiety] without agoraphobia: Secondary | ICD-10-CM

## 2012-10-17 DIAGNOSIS — F1911 Other psychoactive substance abuse, in remission: Secondary | ICD-10-CM | POA: Insufficient documentation

## 2012-10-17 DIAGNOSIS — G8929 Other chronic pain: Secondary | ICD-10-CM | POA: Insufficient documentation

## 2012-10-17 MED ORDER — ALPRAZOLAM 0.5 MG PO TABS
1.0000 mg | ORAL_TABLET | Freq: Once | ORAL | Status: AC
  Administered 2012-10-17: 1 mg via ORAL
  Filled 2012-10-17: qty 2

## 2012-10-17 NOTE — ED Notes (Signed)
Patient upset and shouting that she was treated without compassion and unfairly because "I am an adict". Several suggestions given to the patient on how to obtain an long term RX from her own MD dr. Merla Riches tomorrow. The patient refuses to leave. She is very upset and crying and screaming on the way out of the deparment

## 2012-10-17 NOTE — Telephone Encounter (Signed)
Pt went to ER [name] had 3 panic attacks and her mom is having GI Bypass in [place] during time Merla Riches is working, the ER provider only gave her enough Meds to last her while her mom had the procedure. And she is needing a prescription that will last her till Sunday, she feels bad for asking him over the phone for this but she is in need.

## 2012-10-17 NOTE — Telephone Encounter (Signed)
Patient would like to have a message sent to Dr. Merla Riches regarding a denied refill for Ambien.  She states she would like to have a month supply due to her not being able to sleep without taking the prescription.  She also states she tried to reset her sleep pattern as he suggested but it has not worked.

## 2012-10-17 NOTE — ED Notes (Signed)
Pt asked writer if she can ambulate in the hallways, Clinical research associate said that it was okay as long as staff can see pt ambulating.

## 2012-10-17 NOTE — ED Notes (Signed)
States had an involuntary muscle jerk in lobby; states has a lot of coping techniques and has had a lot of therapy and it hasn't helped when she is having a panic attack; is requesting benzos to help her at this time

## 2012-10-17 NOTE — ED Provider Notes (Signed)
History    This chart was scribed for non-physician practitioner working with Celene Kras, MD by ED Scribe, Burman Nieves. This patient was seen in room WTR7/WTR7 and the patient's care was started at 9:19 PM.   CSN: [id number]  Arrival date & time 10/17/12  1908   First MD Initiated Contact with Patient 10/17/12 2119      Chief Complaint  Patient presents with  . Anxiety    (Consider location/radiation/quality/duration/timing/severity/associated sxs/prior treatment) Patient is a 37 y.o. female presenting with anxiety. The history is provided by the patient. No language interpreter was used.  Anxiety Pertinent negatives include no chest pain, no headaches and no shortness of breath.   [name] is a 37 y.o. female with a h/o narcotic abuse in remission and anxiety who presents to the Emergency Department complaining of moderate constant anxiety onset today. Pt states she is having involuntary muscle jerks due to stress in the ED lobby and has techniques to help from therapy which have not helped with her. Pt had 3 panic attacks today. Pt reports that she is currently prescribed Paxil which has helped. She states she has a lot going on with her right now including finances and family problems. Pt is requesting Benzo's to help cope with the anxiety at this time. Pt came to the ED because her PCP is not in the office on Mondays. Pt denies SI, HI, hallucinations, fever, chills, cough, nausea, vomiting, diarrhea, SOB, weakness, and any other associated symptoms. Pt's PCP is Dr. Yong Channel.  Past Medical History  Diagnosis Date  . Narcotic abuse in remission   . Thyroid disease   . Chronic back pain   . Hypothyroidism   . Anxiety   . GERD (gastroesophageal reflux disease)   . Sleep apnea     cpap  . Headache   . Neuromuscular disorder     neuropathy  . Cancer     vaginal  malignant carcinoma  . Arthritis     knees bilateral   DJD  stenosis   . Blood dyscrasia     HSV    Past  Surgical History  Procedure Laterality Date  . Back surgery    . Cesarean section    . Tonsillectomy    . Tubal ligation    . Fracture surgery      Family History  Problem Relation Age of Onset  . Arthritis Mother   . Clotting disorder Mother   . Hyperlipidemia Father   . Leukemia Maternal Grandmother   . Diabetes Maternal Grandfather   . Stroke Paternal Grandmother     History  Substance Use Topics  . Smoking status: Current Every [place] Smoker -- 1.00 packs/[place] for 17 years    Types: Cigarettes  . Smokeless tobacco: Never Used  . Alcohol Use: No    OB History   Grav Para Term Preterm Abortions TAB SAB Ect Mult Living                  Review of Systems  Constitutional: Negative for fever and diaphoresis.  HENT: Negative for neck pain and neck stiffness.   Eyes: Negative for visual disturbance.  Respiratory: Negative for apnea, chest tightness and shortness of breath.   Cardiovascular: Negative for chest pain and palpitations.  Gastrointestinal: Negative for nausea, vomiting, diarrhea and constipation.  Genitourinary: Negative for dysuria.  Musculoskeletal: Negative for gait problem.  Skin: Negative for rash.  Neurological: Negative for dizziness, weakness, light-headedness, numbness and headaches.  Psychiatric/Behavioral: The patient  is nervous/anxious.     Allergies  Avelox; Levofloxacin; Penicillins; and Quinolones  Home Medications   Current Outpatient Rx  Name  Route  Sig  Dispense  Refill  . ibuprofen (ADVIL,MOTRIN) 800 MG tablet   Oral   Take 800 mg by mouth as needed.         Marland Kitchen levothyroxine (SYNTHROID, LEVOTHROID) 50 MCG tablet   Oral   Take 1 tablet (50 mcg total) by mouth daily.   90 tablet   3   . Multiple Vitamin (MULTIVITAMIN WITH MINERALS) TABS   Oral   Take 1 tablet by mouth daily.         Marland Kitchen PARoxetine (PAXIL) 40 MG tablet   Oral   Take 1 tablet (40 mg total) by mouth every morning.   90 tablet   3   . pregabalin (LYRICA) 200  MG capsule   Oral   Take 1 capsule (200 mg total) by mouth 3 (three) times daily.   270 capsule   3   . zolpidem (AMBIEN) 10 MG tablet   Oral   Take 1 tablet (10 mg total) by mouth at bedtime as needed for sleep.   15 tablet   1     BP 135/89  Pulse 105  Temp(Src) 98.2 F (36.8 C) (Oral)  Resp 22  SpO2 96%  Physical Exam  Nursing note and vitals reviewed. Constitutional: She is oriented to person, place, and time. She appears well-developed and well-nourished. No distress.  HENT:  Head: Normocephalic and atraumatic.  Eyes: Conjunctivae and EOM are normal.  Neck: Normal range of motion. Neck supple.  No meningeal signs  Cardiovascular: Normal rate, regular rhythm and normal heart sounds.   Pulmonary/Chest: Effort normal and breath sounds normal.  Abdominal: Soft. Bowel sounds are normal. She exhibits no distension. There is no tenderness.  Musculoskeletal: Normal range of motion. She exhibits no edema and no tenderness.  Neurological: She is alert and oriented to person, place, and time. No cranial nerve deficit.  No focal deficits  Skin: Skin is warm and dry. She is not diaphoretic. No erythema.  Psychiatric:  Mood switched from normal behavior and thought content, to agitated and confrontational when script for benzos was denied    ED Course  Procedures (including critical care time) DIAGNOSTIC STUDIES: Oxygen Saturation is 96% on room air, adequate by my interpretation.    COORDINATION OF CARE: 9:33 PM Discussed ED treatment with pt and pt agrees.   Medications  ALPRAZolam Prudy Feeler) tablet 1 mg (1 mg Oral Given 10/17/12 2149)     Labs Reviewed - No data to display No results found.   1. Anxiety attack       MDM  Patient presents to the emergency department complaining of symptoms consistent with anxiety.  Patient has a history of same with similar episodes.  Pt was reasonable and rationale discussing stress reducing mechanisms she uses.  Pt asked to be  prescribed benzos though she is seen by Dr. Ellamae Sia for her psychological needs and he has refused to prescribe her benzos. Provided pt with a xanax in the ED after her ride had arrived. Discussed with Dr. Lynelle Doctor who agreed that prescribing benzos was inappropriate at this time. When pt found out I would not prescribe her additional benzos, she became quite agitated, combative, tearful, and followed me out of exam room to keep pleading her case for benzos. I advised pt to wait in exam room for her discharge papers as her emotional  display in the pt area was disruptive to the other patients and inappropriate. I also directed pt to call Dr. Merla Riches first thing in the morning to discuss how to best manage her anxiety.            Glade Nurse, PA-C 10/18/12 1644

## 2012-10-17 NOTE — ED Notes (Signed)
Pt states has had three panic attacks today; no anxiety in five years; doing good on paxil; states has a lot going on in her life right now with her parents and finances causing increased anxiety

## 2012-10-17 NOTE — Telephone Encounter (Signed)
Last rx 3/11 with 1 refill so can refill 4/11 (Gets 15 per month only)

## 2012-10-18 ENCOUNTER — Other Ambulatory Visit: Payer: Self-pay | Admitting: Radiology

## 2012-10-18 ENCOUNTER — Telehealth: Payer: Self-pay | Admitting: Radiology

## 2012-10-18 ENCOUNTER — Telehealth: Payer: Self-pay

## 2012-10-18 DIAGNOSIS — F39 Unspecified mood [affective] disorder: Secondary | ICD-10-CM

## 2012-10-18 NOTE — Telephone Encounter (Signed)
Patient went to ER last night for panic attacks.  She wants to speak with the CL TL  [phone]

## 2012-10-18 NOTE — Telephone Encounter (Signed)
Patient called again. She states she got 2 Xanax she is requesting more. Please advise patient in [place]. Her mother is recovering from gastric bypass. She is currently taking paxil 40 mg, which is not helping currently.

## 2012-10-18 NOTE — Telephone Encounter (Signed)
Spoke to her again, she states [facility] is booked for the next 4 weeks. She is advised to go through the ER at [facility]. Dr Merla Riches has called and spoken to the charge nurse at [facility] ([place])

## 2012-10-18 NOTE — ED Provider Notes (Signed)
Medical screening examination/treatment/procedure(s) were performed by non-physician practitioner and as supervising physician I was immediately available for consultation/collaboration.    Celene Kras, MD 10/18/12 [phone]

## 2012-10-18 NOTE — Telephone Encounter (Signed)
Sorry-she is not a candidate for benzodiazepines from [place] We should continue Paxil at current dose while setting up Psychiatric evaluation Options include:[facility] -she could call herself for an urgent appt/or she could call a psychiatrist directly  Dr Wellington Hampshire in [place]  Dr Chesley Noon in [place](He will let us call to set up appt)  Or Dr Alanson Aly in [place] It would be OK to use a drug other than ambien for the other 15 days of the month, but not ambien very night Options include: vistaril 50mg  #15 !hs  Or trazadone 50mg  #15 1 hs

## 2012-10-18 NOTE — Telephone Encounter (Signed)
She was seen in ER last night because she had 3 panic attacks last night.  She is coming in on Sunday to see you but she needs something to get her through.

## 2012-10-18 NOTE — Telephone Encounter (Signed)
Called her to advise. She has already tried these. She does not want the Vistaril or Trazadone. She is very upset, tearful not happy with our conversation. She is stating she needs the Xanax. She is upset because she was addicted to Opiates not Benzodiazepines. She is tearful. I have given her the number to [facility]. She states this is ruining her life. I have told her she did a good job with getting off opiates, and Dr Merla Riches does not want her to become addicted to another class of drugs. Amy

## 2012-10-18 NOTE — Telephone Encounter (Signed)
Dr Merla Riches has indicated patient should go to ER at [facility] long. She is having panic attacks, and this is beyond his scope of practice. He can not treat her with benzodiazepines because she has long complicated history of opiate addiction. He is not comfortable giving her any controlled substances. She will see the Psychiatrist on staff at [facility] ER> charge nurse has been advised patient coming.

## 2012-10-19 NOTE — Telephone Encounter (Signed)
I was actually in the office while she was in the emergency room She continues to avoid followup for her problems She has been advised that we will not prescribe any medicines that are possibly addictive She is limited to 15 days per month Ambien She needs psychiatric care to be established and has been advised of this We will arrange followup for her with psychiatry as soon as possible

## 2012-11-01 ENCOUNTER — Telehealth: Payer: Self-pay

## 2012-11-01 ENCOUNTER — Other Ambulatory Visit: Payer: Self-pay | Admitting: Internal Medicine

## 2012-11-01 ENCOUNTER — Telehealth: Payer: Self-pay | Admitting: Radiology

## 2012-11-01 DIAGNOSIS — F39 Unspecified mood [affective] disorder: Secondary | ICD-10-CM

## 2012-11-01 DIAGNOSIS — E039 Hypothyroidism, unspecified: Secondary | ICD-10-CM

## 2012-11-01 MED ORDER — PAROXETINE HCL 40 MG PO TABS: 40.0000 mg | ORAL_TABLET | ORAL | Status: DC

## 2012-11-01 MED ORDER — LEVOTHYROXINE SODIUM 50 MCG PO TABS: 50.0000 ug | ORAL_TABLET | Freq: Every day | ORAL | Status: DC

## 2012-11-01 NOTE — Telephone Encounter (Signed)
PT STATES THE PHARMACY REQUESTED A REFILL ON HER PAXIL AND WAS TOLD IT WAS TO SOON, BUT SHE ONLY GOT A 30 [place] SUPPLY INSTEAD OF THE 90 BECAUSE SHE DIDN'T HAVE THE MONEY. IS OUT AND REALLY NEED. PLEASE CALL [phone]   [facility] [address]

## 2012-11-01 NOTE — Telephone Encounter (Signed)
I spoke to patient, she sounds good,very upbeat, her synthroid and paxil were renewed. She wants me to let you know she is well and thanks you for all you have done for her.

## 2012-11-01 NOTE — Telephone Encounter (Signed)
[facility] was sent to [facility]. Called her to advise.

## 2012-11-20 ENCOUNTER — Telehealth: Payer: Self-pay

## 2012-11-20 NOTE — Telephone Encounter (Signed)
PATIENT STATES THAT SHE IS SEEING A PSYCHIATRIST WHO WANTS TO TAKE OVER HER AMBIAN RX. PT ALSO HAS SEVERAL OTHER CONCERNS. PLEASE CALL HER AT [phone].

## 2012-11-21 NOTE — Telephone Encounter (Signed)
I have called her, she states the psychiatrist will take over the Ambien 10mg . She wants you to know. She has started Klonopin prn also. She is requesting a letter from you advising okay for her to take over the Ambien, her name is Dr. Evelene Croon

## 2012-11-22 NOTE — Telephone Encounter (Signed)
Sent to Dr Evelene Croon mailed copy to patient

## 2012-11-22 NOTE — Telephone Encounter (Signed)
Letter to Dr Evelene Croon done for faxing

## 2012-11-27 ENCOUNTER — Emergency Department (HOSPITAL_COMMUNITY)
Admission: EM | Admit: 2012-11-27 | Discharge: 2012-11-27 | Disposition: A | Discharge status: Home / Self Care | Payer: Medicare Other | Attending: Emergency Medicine | Admitting: Emergency Medicine

## 2012-11-27 ENCOUNTER — Emergency Department (HOSPITAL_COMMUNITY): Payer: Medicare Other

## 2012-11-27 ENCOUNTER — Encounter (HOSPITAL_COMMUNITY): Payer: Self-pay | Admitting: *Deleted

## 2012-11-27 DIAGNOSIS — E039 Hypothyroidism, unspecified: Secondary | ICD-10-CM | POA: Insufficient documentation

## 2012-11-27 DIAGNOSIS — G4730 Sleep apnea, unspecified: Secondary | ICD-10-CM | POA: Insufficient documentation

## 2012-11-27 DIAGNOSIS — E669 Obesity, unspecified: Secondary | ICD-10-CM | POA: Insufficient documentation

## 2012-11-27 DIAGNOSIS — G8929 Other chronic pain: Secondary | ICD-10-CM | POA: Insufficient documentation

## 2012-11-27 DIAGNOSIS — W010XXA Fall on same level from slipping, tripping and stumbling without subsequent striking against object, initial encounter: Secondary | ICD-10-CM | POA: Insufficient documentation

## 2012-11-27 DIAGNOSIS — Y939 Activity, unspecified: Secondary | ICD-10-CM | POA: Insufficient documentation

## 2012-11-27 DIAGNOSIS — Z79899 Other long term (current) drug therapy: Secondary | ICD-10-CM | POA: Insufficient documentation

## 2012-11-27 DIAGNOSIS — Z8669 Personal history of other diseases of the nervous system and sense organs: Secondary | ICD-10-CM | POA: Insufficient documentation

## 2012-11-27 DIAGNOSIS — W19XXXA Unspecified fall, initial encounter: Secondary | ICD-10-CM

## 2012-11-27 DIAGNOSIS — Y92009 Unspecified place in unspecified non-institutional (private) residence as the place of occurrence of the external cause: Secondary | ICD-10-CM | POA: Insufficient documentation

## 2012-11-27 DIAGNOSIS — Z8544 Personal history of malignant neoplasm of other female genital organs: Secondary | ICD-10-CM | POA: Insufficient documentation

## 2012-11-27 DIAGNOSIS — R269 Unspecified abnormalities of gait and mobility: Secondary | ICD-10-CM | POA: Insufficient documentation

## 2012-11-27 DIAGNOSIS — Z862 Personal history of diseases of the blood and blood-forming organs and certain disorders involving the immune mechanism: Secondary | ICD-10-CM | POA: Insufficient documentation

## 2012-11-27 DIAGNOSIS — Z9889 Other specified postprocedural states: Secondary | ICD-10-CM | POA: Insufficient documentation

## 2012-11-27 DIAGNOSIS — Z8739 Personal history of other diseases of the musculoskeletal system and connective tissue: Secondary | ICD-10-CM | POA: Insufficient documentation

## 2012-11-27 DIAGNOSIS — IMO0002 Reserved for concepts with insufficient information to code with codable children: Secondary | ICD-10-CM | POA: Insufficient documentation

## 2012-11-27 DIAGNOSIS — Z880 Allergy status to penicillin: Secondary | ICD-10-CM | POA: Insufficient documentation

## 2012-11-27 DIAGNOSIS — Z8719 Personal history of other diseases of the digestive system: Secondary | ICD-10-CM | POA: Insufficient documentation

## 2012-11-27 DIAGNOSIS — R209 Unspecified disturbances of skin sensation: Secondary | ICD-10-CM | POA: Insufficient documentation

## 2012-11-27 DIAGNOSIS — F411 Generalized anxiety disorder: Secondary | ICD-10-CM | POA: Insufficient documentation

## 2012-11-27 DIAGNOSIS — F17200 Nicotine dependence, unspecified, uncomplicated: Secondary | ICD-10-CM | POA: Insufficient documentation

## 2012-11-27 DIAGNOSIS — Z3202 Encounter for pregnancy test, result negative: Secondary | ICD-10-CM | POA: Insufficient documentation

## 2012-11-27 DIAGNOSIS — M545 Low back pain: Secondary | ICD-10-CM

## 2012-11-27 LAB — URINALYSIS, ROUTINE W REFLEX MICROSCOPIC
Bilirubin Urine: NEGATIVE
Ketones, ur: NEGATIVE mg/dL
Nitrite: NEGATIVE
Urobilinogen, UA: 0.2 mg/dL (ref 0.0–1.0)

## 2012-11-27 LAB — RAPID URINE DRUG SCREEN, HOSP PERFORMED: Barbiturates: NOT DETECTED

## 2012-11-27 LAB — CBC WITH DIFFERENTIAL/PLATELET
Basophils Absolute: 0 10*3/uL (ref 0.0–0.1)
Basophils Relative: 1 % (ref 0–1)
Eosinophils Absolute: 0.2 10*3/uL (ref 0.0–0.7)
Hemoglobin: 13 g/dL (ref 12.0–15.0)
MCH: 30.7 pg (ref 26.0–34.0)
MCHC: 35.1 g/dL (ref 30.0–36.0)
Monocytes Absolute: 0.6 10*3/uL (ref 0.1–1.0)
Monocytes Relative: 8 % (ref 3–12)
Neutrophils Relative %: 65 % (ref 43–77)
RDW: 13.2 % (ref 11.5–15.5)

## 2012-11-27 LAB — URINE MICROSCOPIC-ADD ON

## 2012-11-27 LAB — PREGNANCY, URINE: Preg Test, Ur: NEGATIVE

## 2012-11-27 LAB — BASIC METABOLIC PANEL
BUN: 13 mg/dL (ref 6–23)
Creatinine, Ser: 0.86 mg/dL (ref 0.50–1.10)
GFR calc Af Amer: >90 mL/min (ref >90)
GFR calc non Af Amer: 85 mL/min — ABNORMAL LOW (ref >90)
Potassium: 4.2 mEq/L (ref 3.5–5.1)

## 2012-11-27 MED ORDER — HYDROMORPHONE HCL PF 1 MG/ML IJ SOLN: 1.0000 mg | Freq: Once | INTRAMUSCULAR | Status: AC

## 2012-11-27 MED ORDER — SODIUM CHLORIDE 0.9 % IV BOLUS (SEPSIS)
1000.0000 mL | Freq: Once | INTRAVENOUS | Status: AC
  Administered 2012-11-27: 1000 mL via INTRAVENOUS

## 2012-11-27 MED ORDER — KETOROLAC TROMETHAMINE 30 MG/ML IJ SOLN
30.0000 mg | Freq: Once | INTRAMUSCULAR | Status: AC
  Administered 2012-11-27: 30 mg via INTRAVENOUS
  Filled 2012-11-27: qty 1

## 2012-11-27 MED ORDER — FENTANYL CITRATE 0.05 MG/ML IJ SOLN
50.0000 ug | INTRAMUSCULAR | Status: DC | PRN
  Administered 2012-11-27: 50 ug via INTRAVENOUS
  Filled 2012-11-27: qty 2

## 2012-11-27 MED ORDER — HYDROMORPHONE HCL PF 1 MG/ML IJ SOLN
INTRAMUSCULAR | Status: AC
  Administered 2012-11-27: 1 mg via INTRAVENOUS
  Filled 2012-11-27: qty 1

## 2012-11-27 MED ORDER — ONDANSETRON HCL 4 MG/2ML IJ SOLN
4.0000 mg | Freq: Once | INTRAMUSCULAR | Status: AC
  Administered 2012-11-27: 4 mg via INTRAVENOUS
  Filled 2012-11-27: qty 2

## 2012-11-27 MED ORDER — LORAZEPAM 2 MG/ML IJ SOLN
1.0000 mg | Freq: Once | INTRAMUSCULAR | Status: AC | PRN
  Administered 2012-11-27: 1 mg via INTRAVENOUS
  Filled 2012-11-27: qty 1

## 2012-11-27 MED ORDER — OXYCODONE-ACETAMINOPHEN 5-325 MG PO TABS: ORAL_TABLET | ORAL | Status: DC

## 2012-11-27 NOTE — ED Provider Notes (Signed)
Medical screening examination/treatment/procedure(s) were performed by non-physician practitioner and as supervising physician I was immediately available for consultation/collaboration.    Sixto Bowdish R Devyn Griffing, MD 11/27/12 1553 

## 2012-11-27 NOTE — ED Notes (Signed)
Called to pt room to "talk". Pt wanted to talk about her pain medications. States she needs 3.5 mg of dilaudid to get her out of pain. States that is what her psychiatrist dr Lafayette Dragon told her she would need to override her suboxone. Pt very calm during conversation. When pt found out her care provider did not change she became upset and said she is just not getting relief off the medicines she has been given. States she does not like fentanyl. She prefers dilaudid. Pa called and made aware of pt request for more dilaudid. Pa does not wish to give any more dilaudid at this time.

## 2012-11-27 NOTE — ED Notes (Signed)
Pt states fell at 0230 this am injurying her back. States pain radiates to left leg/foot.

## 2012-11-27 NOTE — ED Notes (Signed)
PATIENT IS IN MRI. SHE WILL COME TO POD C TO AWAIT RESULTS WHEN COMPLETED

## 2012-11-27 NOTE — ED Notes (Signed)
Patient has arrived in pod c from [facility]

## 2012-11-27 NOTE — ED Provider Notes (Signed)
History     CSN: [id number]  Arrival date & time 11/27/12  0931   First MD Initiated Contact with Patient 11/27/12 [phone]      Chief Complaint  Patient presents with  . Fall    back pain    (Consider location/radiation/quality/duration/timing/severity/associated sxs/prior treatment) HPI  [name] is a 37 y.o. female complaining of low back pain status post slip and fall with no head trauma radiating down the left hip foot and leg. Patient has prior history of 4 lumbar spinal surgeries. Patient rates her pain at 9/10, it is exacerbated by movement. Patient denies history of IV drug use however she did have narcotic pill abuse in the past. She takes Suboxone status as not for narcotic abuse but for pain control. She says that she usually requires at least 3 mg of Dilaudid to control her pain. Patient denies any change in bowel habits however she does state that she went to bed last night when she was sleeping. After this she has been able to sense normal sensation of fullness in the bladder and has bladder control. She states she has a numbness in the left foot. She denies ataxia.   Past Medical History  Diagnosis Date  . Narcotic abuse in remission   . Thyroid disease   . Chronic back pain   . Hypothyroidism   . Anxiety   . GERD (gastroesophageal reflux disease)   . Sleep apnea     cpap  . Headache   . Neuromuscular disorder     neuropathy  . Cancer     vaginal  malignant carcinoma  . Arthritis     knees bilateral   DJD  stenosis   . Blood dyscrasia     HSV    Past Surgical History  Procedure Laterality Date  . Back surgery    . Cesarean section    . Tonsillectomy    . Tubal ligation    . Fracture surgery      Family History  Problem Relation Age of Onset  . Arthritis Mother   . Clotting disorder Mother   . Hyperlipidemia Father   . Leukemia Maternal Grandmother   . Diabetes Maternal Grandfather   . Stroke Paternal Grandmother     History  Substance Use  Topics  . Smoking status: Current Every [place] Smoker -- 1.00 packs/[place] for 20 years    Types: Cigarettes  . Smokeless tobacco: Never Used  . Alcohol Use: No    OB History   Grav Para Term Preterm Abortions TAB SAB Ect Mult Living                  Review of Systems  Allergies  Avelox; Levofloxacin; Penicillins; and Quinolones  Home Medications   Current Outpatient Rx  Name  Route  Sig  Dispense  Refill  . alprazolam (XANAX) 2 MG tablet   Oral   Take 2 mg by mouth 3 (three) times daily as needed (for panic attacks).         . buprenorphine-naloxone (SUBOXONE) 8-2 MG SUBL   Sublingual   Place 1 tablet under the tongue every morning.         . clonazePAM (KLONOPIN) 1 MG tablet   Oral   Take 1 mg by mouth 3 (three) times daily. Scheduled         . ibuprofen (ADVIL,MOTRIN) 800 MG tablet   Oral   Take 800 mg by mouth every 6 (six) hours as needed for pain.          Marland Kitchen  levothyroxine (SYNTHROID, LEVOTHROID) 50 MCG tablet   Oral   Take 1 tablet (50 mcg total) by mouth daily.   90 tablet   0   . Multiple Vitamin (MULTIVITAMIN WITH MINERALS) TABS   Oral   Take 1 tablet by mouth daily.         Marland Kitchen PARoxetine (PAXIL) 40 MG tablet   Oral   Take 1 tablet (40 mg total) by mouth every morning.   90 tablet   3   . pregabalin (LYRICA) 200 MG capsule   Oral   Take 1 capsule (200 mg total) by mouth 3 (three) times daily.   270 capsule   3     BP 124/35  Pulse 106  Temp(Src) 98.4 F (36.9 C) (Oral)  Resp 20  SpO2 97%  Physical Exam  Nursing note and vitals reviewed. Constitutional: She is oriented to person, place, and time. She appears well-developed and well-nourished. No distress.  Obese   HENT:  Head: Normocephalic.  Eyes: Conjunctivae and EOM are normal.  Neck: Normal range of motion. Neck supple.  Cardiovascular: Normal rate, regular rhythm and normal heart sounds.   Pulmonary/Chest: Effort normal and breath sounds normal. No stridor.  Abdominal:  Soft. There is no tenderness.  Musculoskeletal: Normal range of motion.  Neurological: She is alert and oriented to person, place, and time. She has normal reflexes.  Strength is 5 out of 5x4 extremities, patient ambulates with a coordinated in mildly antalgic gait.  Distal sensation is grossly intact. Patient is unable to distinguish between soft touch and pinprick on the left foot and right inner thigh. Otherwise she is able to distinguish the two on the bilateral lower legs. She states that cannot feel any sensation at all in the labia majora.   Good rectal tone.    Psychiatric:  Labile mood    ED Course  Procedures (including critical care time)  Labs Reviewed  BASIC METABOLIC PANEL - Abnormal; Notable for the following:    Glucose, Bld 100 (*)    GFR calc non Af Amer 85 (*)    All other components within normal limits  URINALYSIS, ROUTINE W REFLEX MICROSCOPIC - Abnormal; Notable for the following:    Leukocytes, UA TRACE (*)    All other components within normal limits  URINE RAPID DRUG SCREEN (HOSP PERFORMED) - Abnormal; Notable for the following:    Benzodiazepines POSITIVE (*)    Tetrahydrocannabinol POSITIVE (*)    All other components within normal limits  URINE MICROSCOPIC-ADD ON - Abnormal; Notable for the following:    Squamous Epithelial / LPF FEW (*)    Bacteria, UA FEW (*)    Crystals CA OXALATE CRYSTALS (*)    All other components within normal limits  CBC WITH DIFFERENTIAL  PREGNANCY, URINE   Mr Lumbar Spine Wo Contrast  11/27/2012  *RADIOLOGY REPORT*  Clinical Data: Severe low back pain with left lower extremity numbness status post fall.  MRI LUMBAR SPINE WITHOUT CONTRAST  Technique:  Multiplanar and multiecho pulse sequences of the lumbar spine were obtained without intravenous contrast.  Comparison: Radiographs 05/27/2012.  MRI 11/07/2010.  Findings: There are five lumbar type vertebral bodies.  The alignment is stable.  There is evidence of interbody fusion  status post laminectomy and PLIF at L5-S1.  There is no evidence of acute fracture or pars defect.  The conus medullaris extends to the L1 level and appears normal. There are no paraspinal abnormalities.  There are no significant disc space findings from  T11-T12 through L2-L3.  L3-L4:  Focal lateral foraminal disc protrusion on the left appears slightly larger and causes probable extraforaminal left L3 nerve root encroachment.  There is stable mild disc bulging.  The central canal and lateral recesses are patent.  L4-L5:  There is mildly progressive disc bulging with a new small right paracentral disc protrusion.  This mildly narrows the right lateral recess.  There is mild facet and ligamentous hypertrophy. No left-sided nerve root encroachment is identified.  L5-S1:  Stable appearance status post laminectomy and PLIF.  There is some residual epidural fibrosis and small osteophytes on the left.  No nerve root encroachment is demonstrated.  IMPRESSION:  1.  Slight enlargement of the lateral foraminal disc protrusion on the left at L3-L4.  This causes probable extraforaminal left L3 nerve root encroachment. 2.  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  No left-sided nerve root encroachment. 3.  Stable appearance at L5-S1 status post laminectomy and PLIF. 4.  No evidence of acute fracture.   Original Report Authenticated By: Carey Bullocks, M.D.     11:04 AM  I discuss pain control options with pharmacist Homero Fellers who recommends staying away from high dose narcotics. He has recommended a starting dose of 50 mcg of fentanyl  1. Low back pain   2. Fall, initial encounter       MDM   [name] Flattery Bonn is a 37 y.o. female several prior spinal surgery is complaining of severe low back pain status post slip and fall last night. Patient has questionable urinary incontinence, she does report a bizarre pattern of anesthesia to the lower extremities and perineal area.  There is no strength deficit and her DTRs are normal and symmetrical. Good rectal tone.  Patient is repeatedly requesting pain medication. MRI pending. Ativan given to accomplish MRI.  MRI shows no acute abnormalities it would require immediate neurosurgical interventions. Discussed results with the patient and advised her to follow up with her orthopedist Dr. Shelle Iron.  Discussed case with attending who agrees with plan and stability to d/c to home.    Filed Vitals:   11/27/12 0938  BP: 124/35  Pulse: 106  Temp: 98.4 F (36.9 C)  TempSrc: Oral  Resp: 20  SpO2: 97%     VSS and patient is appropriate for, and amenable to, discharge at this time. Pt verbalized understanding and agrees with care plan. Outpatient follow-up and return precautions given.    Discharge Medication List as of 11/27/2012  1:45 PM            Wynetta Emery, PA-C 11/27/12 1533

## 2012-11-27 NOTE — ED Notes (Signed)
Pt up to restroom to void.  

## 2012-12-24 ENCOUNTER — Emergency Department (HOSPITAL_COMMUNITY)
Admission: EM | Admit: 2012-12-24 | Discharge: 2012-12-24 | Discharge status: Against Medical Advice | Payer: Medicare Other | Attending: Emergency Medicine | Admitting: Emergency Medicine

## 2012-12-24 ENCOUNTER — Encounter (HOSPITAL_COMMUNITY): Payer: Self-pay | Admitting: *Deleted

## 2012-12-24 DIAGNOSIS — E039 Hypothyroidism, unspecified: Secondary | ICD-10-CM | POA: Insufficient documentation

## 2012-12-24 DIAGNOSIS — G4730 Sleep apnea, unspecified: Secondary | ICD-10-CM | POA: Insufficient documentation

## 2012-12-24 DIAGNOSIS — Z880 Allergy status to penicillin: Secondary | ICD-10-CM | POA: Insufficient documentation

## 2012-12-24 DIAGNOSIS — F17200 Nicotine dependence, unspecified, uncomplicated: Secondary | ICD-10-CM | POA: Insufficient documentation

## 2012-12-24 DIAGNOSIS — Y929 Unspecified place or not applicable: Secondary | ICD-10-CM | POA: Insufficient documentation

## 2012-12-24 DIAGNOSIS — Z9889 Other specified postprocedural states: Secondary | ICD-10-CM | POA: Insufficient documentation

## 2012-12-24 DIAGNOSIS — Z862 Personal history of diseases of the blood and blood-forming organs and certain disorders involving the immune mechanism: Secondary | ICD-10-CM | POA: Insufficient documentation

## 2012-12-24 DIAGNOSIS — Z8619 Personal history of other infectious and parasitic diseases: Secondary | ICD-10-CM | POA: Insufficient documentation

## 2012-12-24 DIAGNOSIS — Z9181 History of falling: Secondary | ICD-10-CM | POA: Insufficient documentation

## 2012-12-24 DIAGNOSIS — R209 Unspecified disturbances of skin sensation: Secondary | ICD-10-CM | POA: Insufficient documentation

## 2012-12-24 DIAGNOSIS — Z8719 Personal history of other diseases of the digestive system: Secondary | ICD-10-CM | POA: Insufficient documentation

## 2012-12-24 DIAGNOSIS — Z8544 Personal history of malignant neoplasm of other female genital organs: Secondary | ICD-10-CM | POA: Insufficient documentation

## 2012-12-24 DIAGNOSIS — IMO0002 Reserved for concepts with insufficient information to code with codable children: Secondary | ICD-10-CM | POA: Insufficient documentation

## 2012-12-24 DIAGNOSIS — Z8669 Personal history of other diseases of the nervous system and sense organs: Secondary | ICD-10-CM | POA: Insufficient documentation

## 2012-12-24 DIAGNOSIS — M549 Dorsalgia, unspecified: Secondary | ICD-10-CM

## 2012-12-24 DIAGNOSIS — G8929 Other chronic pain: Secondary | ICD-10-CM

## 2012-12-24 DIAGNOSIS — F1120 Opioid dependence, uncomplicated: Secondary | ICD-10-CM | POA: Insufficient documentation

## 2012-12-24 DIAGNOSIS — Z8739 Personal history of other diseases of the musculoskeletal system and connective tissue: Secondary | ICD-10-CM | POA: Insufficient documentation

## 2012-12-24 DIAGNOSIS — W19XXXA Unspecified fall, initial encounter: Secondary | ICD-10-CM | POA: Insufficient documentation

## 2012-12-24 DIAGNOSIS — F411 Generalized anxiety disorder: Secondary | ICD-10-CM | POA: Insufficient documentation

## 2012-12-24 DIAGNOSIS — Y939 Activity, unspecified: Secondary | ICD-10-CM | POA: Insufficient documentation

## 2012-12-24 NOTE — ED Notes (Signed)
The pt had an injection in her spine on Friday for pain.  She has some intermittent numbness in her lt foot and she stumbled and fell yesterday.  She now has bruising along her lower back from the fall.

## 2012-12-24 NOTE — ED Notes (Signed)
Pt. Is not in her room at this time. Left AMA.

## 2012-12-24 NOTE — ED Provider Notes (Signed)
History     CSN: [id number]  Arrival date & time 12/24/12  2055   First MD Initiated Contact with Patient 12/24/12 2240      Chief Complaint  Patient presents with  . Back Pain    (Consider location/radiation/quality/duration/timing/severity/associated sxs/prior treatment) Patient is a 37 y.o. female presenting with back pain. The history is provided by the patient.  Back Pain Location:  Lumbar spine Quality:  Aching Pain severity:  Severe Onset quality:  Sudden Associated symptoms: no abdominal pain, no fever, no headaches, no numbness and no weakness   Associated symptoms comment:  She reports another fall that occurred last night causing low back pain. She was seen one month ago after fall and received an MRI that showed no condition requiring emergent surgical intervention. Tonight she continues to have numbness in left buttock and tingling into left leg. She reports she was given a prescription for Dilaudid one week ago but is currently out after taking them as prescribed.    Past Medical History  Diagnosis Date  . Narcotic abuse in remission   . Thyroid disease   . Chronic back pain   . Hypothyroidism   . Anxiety   . GERD (gastroesophageal reflux disease)   . Sleep apnea     cpap  . Headache(784.0)   . Neuromuscular disorder     neuropathy  . Cancer     vaginal  malignant carcinoma  . Arthritis     knees bilateral   DJD  stenosis   . Blood dyscrasia     HSV    Past Surgical History  Procedure Laterality Date  . Back surgery    . Cesarean section    . Tonsillectomy    . Tubal ligation    . Fracture surgery      Family History  Problem Relation Age of Onset  . Arthritis Mother   . Clotting disorder Mother   . Hyperlipidemia Father   . Leukemia Maternal Grandmother   . Diabetes Maternal Grandfather   . Stroke Paternal Grandmother     History  Substance Use Topics  . Smoking status: Current Every Styer Smoker -- 1.00 packs/Alf for 20 years    Types:  Cigarettes  . Smokeless tobacco: Never Used  . Alcohol Use: No    OB History   Grav Para Term Preterm Abortions TAB SAB Ect Mult Living                  Review of Systems  Constitutional: Negative for fever and chills.  HENT: Negative.  Negative for neck pain.   Gastrointestinal: Negative.  Negative for nausea and abdominal pain.  Musculoskeletal: Positive for back pain.  Skin: Negative for wound.  Neurological: Negative.  Negative for weakness, numbness and headaches.    Allergies  Avelox; Levofloxacin; Penicillins; and Quinolones  Home Medications   Current Outpatient Rx  Name  Route  Sig  Dispense  Refill  . ambrisentan (LETAIRIS) 10 MG tablet   Oral   Take 10 mg by mouth at bedtime.         . Buprenorphine HCl-Naloxone HCl (SUBOXONE) 4-1 MG FILM   Sublingual   Place 1 tablet under the tongue daily.         . clonazePAM (KLONOPIN) 1 MG tablet   Oral   Take 1 mg by mouth 3 (three) times daily.         Marland Kitchen ibuprofen (ADVIL,MOTRIN) 800 MG tablet   Oral   Take 800 mg  by mouth every 6 (six) hours as needed for pain.          Marland Kitchen levothyroxine (SYNTHROID, LEVOTHROID) 50 MCG tablet   Oral   Take 1 tablet (50 mcg total) by mouth daily.   90 tablet   0   . Multiple Vitamin (MULTIVITAMIN WITH MINERALS) TABS   Oral   Take 1 tablet by mouth daily.         Marland Kitchen PARoxetine (PAXIL) 40 MG tablet   Oral   Take 1 tablet (40 mg total) by mouth every morning.   90 tablet   3   . pregabalin (LYRICA) 200 MG capsule   Oral   Take 1 capsule (200 mg total) by mouth 3 (three) times daily.   270 capsule   3     BP 140/71  Pulse 92  Temp(Src) 99.1 F (37.3 C) (Oral)  Resp 16  SpO2 97%  Physical Exam  Constitutional: She is oriented to person, place, and time. She appears well-developed and well-nourished.  HENT:  Head: Normocephalic.  Neck: Normal range of motion. Neck supple.  Pulmonary/Chest: Effort normal.  Abdominal: Soft. There is no tenderness. There is  no rebound and no guarding.  Musculoskeletal: Normal range of motion.  Mild lumbar and paralumbar tenderness without swelling or discoloration.  Neurological: She is alert and oriented to person, place, and time. She has normal reflexes. Coordination normal.  Skin: Skin is warm and dry. No rash noted.  Mild bilateral paralumbar skin discoloration and peeling. No ecchymosis. Skin is non-tender.  Psychiatric: She has a normal mood and affect.    ED Course  Procedures (including critical care time)  Labs Reviewed - No data to display No results found.   No diagnosis found.  1. Back pain 2. Chronic pain  MDM  Patient's record reviewed. Well documented history of opiate addiction. [place] Controlled Substances website shows #60 4mg  Dilaudid tablets filled on 12/16/12. Discussed non-narcotic alternatives to pain which she declined, including muscle relaxers, anti-inflammatories.         Arnoldo Hooker, PA-C 12/24/12 2329

## 2012-12-24 NOTE — ED Provider Notes (Signed)
Medical screening examination/treatment/procedure(s) were performed by non-physician practitioner and as supervising physician I was immediately available for consultation/collaboration. Devoria Albe, MD, Armando Gang   Ward Givens, MD 12/24/12 [phone]

## 2012-12-26 ENCOUNTER — Telehealth: Payer: Self-pay

## 2012-12-26 NOTE — Telephone Encounter (Signed)
Patient needs a referral to a new psychiatrist hers (dr Evelene Croon) does not take her insurance anymore.

## 2012-12-27 NOTE — Telephone Encounter (Signed)
Need a list that her insurance works with so we can choose someone Or Dr Evelene Croon might suggest someone she trusts

## 2012-12-27 NOTE — Telephone Encounter (Signed)
Wow, this is unfortunate for her. I called her to advise. She is instructed to get list for Dr Merla Riches to review and to call me back.

## 2013-01-14 ENCOUNTER — Ambulatory Visit (INDEPENDENT_AMBULATORY_CARE_PROVIDER_SITE_OTHER): Payer: Medicare Other | Admitting: Internal Medicine

## 2013-01-14 VITALS — BP 100/83 | HR 81 | Temp 97.9°F | Resp 18 | Wt 305.0 lb

## 2013-01-14 DIAGNOSIS — M545 Low back pain: Secondary | ICD-10-CM

## 2013-01-14 MED ORDER — HYDROMORPHONE HCL 8 MG PO TABS: ORAL_TABLET | ORAL | Status: DC

## 2013-01-14 MED ORDER — HYDROMORPHONE HCL 4 MG PO TABS: ORAL_TABLET | ORAL | Status: DC

## 2013-01-14 NOTE — Progress Notes (Signed)
  Subjective:    Patient ID: [name], female    DOB: 1976-05-23, 37 y.o.   MRN: [id number]  HPI neurosurgical problem lumbar area documented by MRI and surgery planned by Dr. Branson/currently on pain medication from Dr.Ramos(doubt lot at 4 mg tablets) and was given permission to take 1-2 tablets every 4 hours  but only given enough to take one tablet every 4 hours so she is finished with her medications 2 days early for appointment on Monday  Has radicular symptoms into both lower extremities with foot drop on the left  Dr.Kaur no longer takes her insurance and has discharged her/was on treatment for severe anxiety disorder/mood disorder with Paxil 40 mg and Klonopin/she has [facility] at the end of the month  Patient Active Problem List   Diagnosis Date Noted  . Obesity, Class III, BMI 40-49.9 (morbid obesity) 10/31/2011  . Mood disorder 10/31/2011  . Hypothyroidism 10/31/2011  . Obstructive sleep apnea 10/31/2011  . Narcotic abuse in remission   . Chronic back pain       Review of Systems     Objective:   Physical Exam BP 100/83  Pulse 81  Temp(Src) 97.9 F (36.6 C) (Oral)  Resp 18  Wt 305 lb (138.347 kg)  BMI 49.25 kg/m2 Tearful/anxious Morbidly obese        Assessment & Plan:   acute lumbar pain superimposed on chronic lumbar pain in need of surgical treatment  Meds ordered this encounter  Medications  . HYDROmorphone (DILAUDID) 8 MG tablet    Sig: 1/2 to 1 tabs q 4 hrs as needed to last to next appointment on 01/16/13    Dispense:  15 tablet  to last until appointment     Refill:  0

## 2013-01-14 NOTE — Progress Notes (Signed)
  Subjective:    Patient ID: [name], female    DOB: June 12, 1976, 37 y.o.   MRN: [id number]  HPI Pt going to have surgery on disc in her back. She is currently on Dilaudid 4mg . She is here to get her pain meds to get her thru Monday. She states she gets post-surgery anxiety pretty badly. Dr Evelene Croon does not take her insurance anymore, so she has been going to [facility].     Review of Systems     Objective:   Physical Exam        Assessment & Plan:

## 2013-01-16 ENCOUNTER — Encounter (HOSPITAL_COMMUNITY): Payer: Self-pay | Admitting: Emergency Medicine

## 2013-01-16 ENCOUNTER — Emergency Department (HOSPITAL_COMMUNITY)
Admission: EM | Admit: 2013-01-16 | Discharge: 2013-01-17 | Discharge status: Against Medical Advice | Payer: Medicare Other | Source: Home / Self Care

## 2013-01-16 ENCOUNTER — Telehealth: Payer: Self-pay

## 2013-01-16 DIAGNOSIS — E039 Hypothyroidism, unspecified: Secondary | ICD-10-CM | POA: Insufficient documentation

## 2013-01-16 DIAGNOSIS — Y9389 Activity, other specified: Secondary | ICD-10-CM | POA: Insufficient documentation

## 2013-01-16 DIAGNOSIS — Z9889 Other specified postprocedural states: Secondary | ICD-10-CM | POA: Insufficient documentation

## 2013-01-16 DIAGNOSIS — Z8669 Personal history of other diseases of the nervous system and sense organs: Secondary | ICD-10-CM | POA: Insufficient documentation

## 2013-01-16 DIAGNOSIS — Y939 Activity, unspecified: Secondary | ICD-10-CM | POA: Insufficient documentation

## 2013-01-16 DIAGNOSIS — Z862 Personal history of diseases of the blood and blood-forming organs and certain disorders involving the immune mechanism: Secondary | ICD-10-CM | POA: Insufficient documentation

## 2013-01-16 DIAGNOSIS — Z8544 Personal history of malignant neoplasm of other female genital organs: Secondary | ICD-10-CM | POA: Insufficient documentation

## 2013-01-16 DIAGNOSIS — Z8719 Personal history of other diseases of the digestive system: Secondary | ICD-10-CM | POA: Insufficient documentation

## 2013-01-16 DIAGNOSIS — M1710 Unilateral primary osteoarthritis, unspecified knee: Secondary | ICD-10-CM | POA: Insufficient documentation

## 2013-01-16 DIAGNOSIS — Z79899 Other long term (current) drug therapy: Secondary | ICD-10-CM | POA: Insufficient documentation

## 2013-01-16 DIAGNOSIS — Y92009 Unspecified place in unspecified non-institutional (private) residence as the place of occurrence of the external cause: Secondary | ICD-10-CM | POA: Insufficient documentation

## 2013-01-16 DIAGNOSIS — G8929 Other chronic pain: Secondary | ICD-10-CM | POA: Insufficient documentation

## 2013-01-16 DIAGNOSIS — F17200 Nicotine dependence, unspecified, uncomplicated: Secondary | ICD-10-CM | POA: Insufficient documentation

## 2013-01-16 DIAGNOSIS — W010XXA Fall on same level from slipping, tripping and stumbling without subsequent striking against object, initial encounter: Secondary | ICD-10-CM | POA: Insufficient documentation

## 2013-01-16 DIAGNOSIS — IMO0002 Reserved for concepts with insufficient information to code with codable children: Secondary | ICD-10-CM | POA: Insufficient documentation

## 2013-01-16 DIAGNOSIS — G4730 Sleep apnea, unspecified: Secondary | ICD-10-CM | POA: Insufficient documentation

## 2013-01-16 DIAGNOSIS — R296 Repeated falls: Secondary | ICD-10-CM | POA: Insufficient documentation

## 2013-01-16 DIAGNOSIS — Z880 Allergy status to penicillin: Secondary | ICD-10-CM | POA: Insufficient documentation

## 2013-01-16 DIAGNOSIS — Z8619 Personal history of other infectious and parasitic diseases: Secondary | ICD-10-CM | POA: Insufficient documentation

## 2013-01-16 DIAGNOSIS — Y929 Unspecified place or not applicable: Secondary | ICD-10-CM | POA: Insufficient documentation

## 2013-01-16 DIAGNOSIS — F411 Generalized anxiety disorder: Secondary | ICD-10-CM | POA: Insufficient documentation

## 2013-01-16 NOTE — ED Notes (Signed)
PT. FELL AT HOME TODAY REPORTS PAIN AT LOWER BACK RADIATING TO LEFT LEG  UNRELIEVED BY PRESCRIPTION IBUPROFEN , PT. STATED SCHEDULED SURGERY THIS Monday BY DR. Shon Baton AT HER LOWER BACK .

## 2013-01-16 NOTE — Telephone Encounter (Signed)
Patient would like tramadol rx.

## 2013-01-16 NOTE — Telephone Encounter (Signed)
Patient states that Dr Merla Riches needs to speak with her orthopaedic specialist about prescribing medication until she is seen by them July 15th. Orthopaedist's phone number [phone]

## 2013-01-16 NOTE — Telephone Encounter (Signed)
Please advise 

## 2013-01-16 NOTE — ED Notes (Signed)
Name called no answer 

## 2013-01-16 NOTE — Telephone Encounter (Addendum)
PT WANTED TO MAKE SURE DR DOOLITTLE KNEW THAT HER ORTH APPT FOR THIS MORNING WAS CANCELLED AND WON'T BE RESCHEDULED UNTIL July 15TH AT 8:30. REALLY NEED HIM TO WRITE HER PAIN MEDICINE ENOUGH UNTIL THAT TIME. IT WAS HARD TO FIND THE 8MG S, BUT IF HE WRITE IT FOR 4MG S TO TAKE EVERY 1-2 HRS. WILL BE HELPFUL PLEASE CALL [phone]  PT CALLED BACK TO SAY THE DR WAS DR Lindwood Qua AT [phone] JUST IN CASE DR Merla Riches WANT TO VERTIFY

## 2013-01-17 ENCOUNTER — Encounter (HOSPITAL_COMMUNITY): Payer: Self-pay | Admitting: Emergency Medicine

## 2013-01-17 ENCOUNTER — Emergency Department (HOSPITAL_COMMUNITY)
Admission: EM | Admit: 2013-01-17 | Discharge: 2013-01-17 | Disposition: A | Discharge status: Home / Self Care | Payer: Medicare Other | Attending: Emergency Medicine | Admitting: Emergency Medicine

## 2013-01-17 DIAGNOSIS — G8929 Other chronic pain: Secondary | ICD-10-CM

## 2013-01-17 MED ORDER — TRAMADOL HCL 50 MG PO TABS: 50.0000 mg | ORAL_TABLET | Freq: Four times a day (QID) | ORAL | Status: DC | PRN

## 2013-01-17 MED ORDER — OXYCODONE-ACETAMINOPHEN 5-325 MG PO TABS
2.0000 | ORAL_TABLET | Freq: Once | ORAL | Status: AC
  Administered 2013-01-17: 2 via ORAL
  Filled 2013-01-17: qty 2

## 2013-01-17 MED ORDER — OXYCODONE-ACETAMINOPHEN 5-325 MG PO TABS: ORAL_TABLET | ORAL | Status: DC

## 2013-01-17 NOTE — ED Provider Notes (Signed)
History    CSN: [id number] Arrival date & time 01/17/13  1242  First MD Initiated Contact with Patient 01/17/13 1350     Chief Complaint  Patient presents with  . Back Pain   (Consider location/radiation/quality/duration/timing/severity/associated sxs/prior Treatment) HPI Comments: 37 y.o.female with PMHx of back pain, narcotic abuse, and morbid obesity presents today s/p mechanical fall yesterday and exacerbating her chronic injury. Pt states that she is scheduled for surgery on Monday 01/23/13 and has been following up with Dr. Shon Baton. Pt states she is out of prescribed pain meds and is here today seeking relief. No loss of bowel or bladder control, no fever, night sweats, weight loss, h/o cancer,  Quality: sharp Severity: 10/10 Radiation: down back of left leg Onset quality: sudden Duration: since yesterday Timing: Constant  Progression: Unchanged  Relieved by: narcotic pain meds Worsened by: movement Ineffective treatments: ibuprofen    Patient is a 37 y.o. female presenting with back pain.  Back Pain Associated symptoms: no chest pain, no dysuria, no fever, no headaches, no numbness and no weakness    Past Medical History  Diagnosis Date  . Narcotic abuse in remission   . Thyroid disease   . Chronic back pain   . Hypothyroidism   . Anxiety   . GERD (gastroesophageal reflux disease)   . Sleep apnea     cpap  . Headache(784.0)   . Neuromuscular disorder     neuropathy  . Cancer     vaginal  malignant carcinoma  . Arthritis     knees bilateral   DJD  stenosis   . Blood dyscrasia     HSV   Past Surgical History  Procedure Laterality Date  . Back surgery    . Cesarean section    . Tonsillectomy    . Tubal ligation    . Fracture surgery     Family History  Problem Relation Age of Onset  . Arthritis Mother   . Clotting disorder Mother   . Hyperlipidemia Father   . Leukemia Maternal Grandmother   . Diabetes Maternal Grandfather   . Stroke Paternal  Grandmother    History  Substance Use Topics  . Smoking status: Current Every Ring Smoker -- 1.00 packs/Tortorella for 20 years    Types: Cigarettes  . Smokeless tobacco: Never Used  . Alcohol Use: No   OB History   Grav Para Term Preterm Abortions TAB SAB Ect Mult Living                 Review of Systems  Constitutional: Negative for fever and diaphoresis.  HENT: Negative for neck pain and neck stiffness.   Eyes: Negative for visual disturbance.  Respiratory: Negative for apnea, chest tightness and shortness of breath.   Cardiovascular: Negative for chest pain and palpitations.  Gastrointestinal: Negative for nausea, vomiting, diarrhea and constipation.  Genitourinary: Negative for dysuria.  Musculoskeletal: Positive for back pain. Negative for gait problem.  Skin: Negative for rash.  Neurological: Negative for dizziness, weakness, light-headedness, numbness and headaches.    Allergies  Avelox; Levofloxacin; Penicillins; and Quinolones  Home Medications   Current Outpatient Rx  Name  Route  Sig  Dispense  Refill  . ibuprofen (ADVIL,MOTRIN) 800 MG tablet   Oral   Take 800 mg by mouth every 6 (six) hours as needed for pain.          Marland Kitchen levothyroxine (SYNTHROID, LEVOTHROID) 50 MCG tablet   Oral   Take 1 tablet (50 mcg total) by  mouth daily.   90 tablet   0   . PARoxetine (PAXIL) 40 MG tablet   Oral   Take 1 tablet (40 mg total) by mouth every morning.   90 tablet   3   . pregabalin (LYRICA) 200 MG capsule   Oral   Take 1 capsule (200 mg total) by mouth 3 (three) times daily.   270 capsule   3   . oxyCODONE-acetaminophen (PERCOCET/ROXICET) 5-325 MG per tablet      Take one or two tablets by mouth every 4 to 6 hours as needed for pain. Do not take with Tylenol as this tablet already contains tylenol   8 tablet   0   . traMADol (ULTRAM) 50 MG tablet   Oral   Take 1 tablet (50 mg total) by mouth every 6 (six) hours as needed for pain.   30 tablet   0    BP  137/83  Pulse 118  Temp(Src) 99 F (37.2 C) (Oral)  Resp 17  SpO2 97% Physical Exam  Nursing note and vitals reviewed. Constitutional: She is oriented to person, place, and time. She appears well-developed and well-nourished. No distress.  HENT:  Head: Normocephalic and atraumatic.  Eyes: Conjunctivae and EOM are normal.  Neck: Normal range of motion. Neck supple.  No meningeal signs  Cardiovascular: Normal rate, regular rhythm and normal heart sounds.  Exam reveals no gallop and no friction rub.   No murmur heard. Pulmonary/Chest: Effort normal and breath sounds normal. No respiratory distress. She has no wheezes. She has no rales. She exhibits no tenderness.  Abdominal: Soft. Bowel sounds are normal. She exhibits no distension. There is no tenderness. There is no rebound and no guarding.  Musculoskeletal: Normal range of motion. She exhibits no edema and no tenderness.  FROM to upper and lower extremities No step-offs noted on C-spine No tenderness to palpation of the spinous processes of the C-spine, T-spine or L-spine Full range of motion of C-spine, T-spine  L-spine Mild tenderness to palpation of the lumbar paraspinous muscles   Neurological: She is alert and oriented to person, place, and time. No cranial nerve deficit.  Speech is clear and goal oriented, follows commands Sensation normal to light touch and two point discrimination Moves extremities without ataxia, coordination intact Normal gait and balance Normal strength in upper and lower extremities bilaterally including dorsiflexion and plantar flexion, strong and equal grip strength   Skin: Skin is warm and dry. She is not diaphoretic. No erythema.  Psychiatric: She has a normal mood and affect.    ED Course  Procedures (including critical care time) Labs Reviewed - No data to display No results found. 1. Chronic back pain     MDM  Patient with back pain.  No neurological deficits and normal neuro exam.   Patient can walk but states is painful.  No loss of bowel or bladder control.  No concern for cauda equina.  No fever, night sweats, weight loss, h/o cancer, IVDU.  RICE protocol and pain medicine indicated and discussed with patient. Considered pt previous use of narcotics, but note most recent prescription was written to end today so feel those meds were used appropriately. Will give pt a script for tramadol and small amount of percocet. Instructed pt to use percocet for breakthrough pain only to make it last till Monday. Discussed reasons to seek immediate care. Patient expresses understanding and agrees with plan.   Glade Nurse, PA-C 01/17/13 2230

## 2013-01-17 NOTE — ED Notes (Signed)
Patient no answer x3

## 2013-01-17 NOTE — Telephone Encounter (Signed)
I thought this was done, but I do not see your reply, are you going to provide her medications as requested?

## 2013-01-17 NOTE — Telephone Encounter (Signed)
Dr Merla Riches took care of this yesterday.

## 2013-01-17 NOTE — Telephone Encounter (Signed)
Thank you. She has been advised.

## 2013-01-17 NOTE — ED Notes (Signed)
Pt states she has had back surgery x 4, last 2007 by Dr. Shon Baton. States is supposed to see him on Monday but needs pain meds to get her through until then. C/O back pain radiating down left leg.

## 2013-01-17 NOTE — ED Notes (Signed)
States tripped over her foot last night because it is numb has appointment to see ortho dr on  Monday  Came last night but could not stay

## 2013-01-17 NOTE — Telephone Encounter (Signed)
The message for her was that we are not involved and have not been involved in her recent diagnosis and referral to orthopedics in plan for surgery. We are not responsible for treating her pain at this point and only give her any further narcotics or tramadol. We gave her a we can supply of medication to last until she could get back in contact with her responsible providers  On Monday (yesterday). We are not responsible for calling her Orthopedist to verify anything. She will need to discuss her pain treatment plan for the time between now and her planned surgery with her orthopedist whether that is Dr. Ethelene Hal or Dr. Lindwood Qua.

## 2013-01-21 NOTE — ED Provider Notes (Signed)
History/physical exam/procedure(s) were performed by non-physician practitioner and as supervising physician I was immediately available for consultation/collaboration. I have reviewed all notes and am in agreement with care and plan.   Everado Pillsbury S Reola Buckles, MD 01/21/13 1227 

## 2013-01-23 ENCOUNTER — Encounter (HOSPITAL_COMMUNITY): Payer: Self-pay | Admitting: *Deleted

## 2013-01-23 ENCOUNTER — Telehealth: Payer: Self-pay

## 2013-01-23 ENCOUNTER — Emergency Department (HOSPITAL_COMMUNITY): Payer: Medicare Other

## 2013-01-23 ENCOUNTER — Emergency Department (HOSPITAL_COMMUNITY)
Admission: EM | Admit: 2013-01-23 | Discharge: 2013-01-23 | Disposition: A | Discharge status: Home / Self Care | Payer: Medicare Other | Attending: Emergency Medicine | Admitting: Emergency Medicine

## 2013-01-23 DIAGNOSIS — Z79899 Other long term (current) drug therapy: Secondary | ICD-10-CM | POA: Insufficient documentation

## 2013-01-23 DIAGNOSIS — Z8669 Personal history of other diseases of the nervous system and sense organs: Secondary | ICD-10-CM | POA: Insufficient documentation

## 2013-01-23 DIAGNOSIS — Z880 Allergy status to penicillin: Secondary | ICD-10-CM | POA: Insufficient documentation

## 2013-01-23 DIAGNOSIS — Z8544 Personal history of malignant neoplasm of other female genital organs: Secondary | ICD-10-CM | POA: Insufficient documentation

## 2013-01-23 DIAGNOSIS — G8929 Other chronic pain: Secondary | ICD-10-CM | POA: Insufficient documentation

## 2013-01-23 DIAGNOSIS — M545 Low back pain, unspecified: Secondary | ICD-10-CM | POA: Insufficient documentation

## 2013-01-23 DIAGNOSIS — Y939 Activity, unspecified: Secondary | ICD-10-CM | POA: Insufficient documentation

## 2013-01-23 DIAGNOSIS — Z862 Personal history of diseases of the blood and blood-forming organs and certain disorders involving the immune mechanism: Secondary | ICD-10-CM | POA: Insufficient documentation

## 2013-01-23 DIAGNOSIS — M549 Dorsalgia, unspecified: Secondary | ICD-10-CM

## 2013-01-23 DIAGNOSIS — Z8719 Personal history of other diseases of the digestive system: Secondary | ICD-10-CM | POA: Insufficient documentation

## 2013-01-23 DIAGNOSIS — G4730 Sleep apnea, unspecified: Secondary | ICD-10-CM | POA: Insufficient documentation

## 2013-01-23 DIAGNOSIS — Z3202 Encounter for pregnancy test, result negative: Secondary | ICD-10-CM | POA: Insufficient documentation

## 2013-01-23 DIAGNOSIS — M1710 Unilateral primary osteoarthritis, unspecified knee: Secondary | ICD-10-CM | POA: Insufficient documentation

## 2013-01-23 DIAGNOSIS — R209 Unspecified disturbances of skin sensation: Secondary | ICD-10-CM | POA: Insufficient documentation

## 2013-01-23 DIAGNOSIS — Y92009 Unspecified place in unspecified non-institutional (private) residence as the place of occurrence of the external cause: Secondary | ICD-10-CM | POA: Insufficient documentation

## 2013-01-23 DIAGNOSIS — F411 Generalized anxiety disorder: Secondary | ICD-10-CM | POA: Insufficient documentation

## 2013-01-23 DIAGNOSIS — E039 Hypothyroidism, unspecified: Secondary | ICD-10-CM | POA: Insufficient documentation

## 2013-01-23 DIAGNOSIS — W06XXXA Fall from bed, initial encounter: Secondary | ICD-10-CM | POA: Insufficient documentation

## 2013-01-23 DIAGNOSIS — Z9889 Other specified postprocedural states: Secondary | ICD-10-CM | POA: Insufficient documentation

## 2013-01-23 DIAGNOSIS — S8990XA Unspecified injury of unspecified lower leg, initial encounter: Secondary | ICD-10-CM | POA: Insufficient documentation

## 2013-01-23 DIAGNOSIS — R200 Anesthesia of skin: Secondary | ICD-10-CM

## 2013-01-23 DIAGNOSIS — F17200 Nicotine dependence, unspecified, uncomplicated: Secondary | ICD-10-CM | POA: Insufficient documentation

## 2013-01-23 LAB — URINALYSIS, ROUTINE W REFLEX MICROSCOPIC
Bilirubin Urine: NEGATIVE
Glucose, UA: NEGATIVE mg/dL
Hgb urine dipstick: NEGATIVE
Ketones, ur: 15 mg/dL — AB
Leukocytes, UA: NEGATIVE
Nitrite: NEGATIVE
Protein, ur: NEGATIVE mg/dL
Specific Gravity, Urine: 1.009 (ref 1.005–1.030)
Urobilinogen, UA: 0.2 mg/dL (ref 0.0–1.0)
pH: 5.5 (ref 5.0–8.0)

## 2013-01-23 LAB — PREGNANCY, URINE: Preg Test, Ur: NEGATIVE

## 2013-01-23 MED ORDER — OXYCODONE-ACETAMINOPHEN 5-325 MG PO TABS
2.0000 | ORAL_TABLET | Freq: Once | ORAL | Status: AC
  Administered 2013-01-23: 2 via ORAL
  Filled 2013-01-23: qty 2

## 2013-01-23 MED ORDER — DIAZEPAM 5 MG PO TABS: 5.0000 mg | ORAL_TABLET | Freq: Two times a day (BID) | ORAL | Status: DC

## 2013-01-23 MED ORDER — HYDROMORPHONE HCL PF 2 MG/ML IJ SOLN
2.0000 mg | Freq: Once | INTRAMUSCULAR | Status: AC
  Administered 2013-01-23: 2 mg via INTRAMUSCULAR
  Filled 2013-01-23: qty 1

## 2013-01-23 MED ORDER — HYDROMORPHONE HCL PF 1 MG/ML IJ SOLN
1.0000 mg | Freq: Once | INTRAMUSCULAR | Status: DC
  Filled 2013-01-23: qty 1

## 2013-01-23 MED ORDER — OXYCODONE-ACETAMINOPHEN 5-325 MG PO TABS: 1.0000 | ORAL_TABLET | Freq: Four times a day (QID) | ORAL | Status: DC | PRN

## 2013-01-23 MED ORDER — PREDNISONE 20 MG PO TABS: 40.0000 mg | ORAL_TABLET | Freq: Every day | ORAL | Status: DC

## 2013-01-23 MED ORDER — DIAZEPAM 2 MG PO TABS
2.0000 mg | ORAL_TABLET | Freq: Once | ORAL | Status: AC
  Administered 2013-01-23: 2 mg via ORAL
  Filled 2013-01-23: qty 1

## 2013-01-23 MED ORDER — HYDROMORPHONE HCL PF 1 MG/ML IJ SOLN
1.0000 mg | Freq: Once | INTRAMUSCULAR | Status: AC
  Administered 2013-01-23: 1 mg via INTRAMUSCULAR

## 2013-01-23 NOTE — ED Notes (Signed)
Antony Madura PA-C at bedside.

## 2013-01-23 NOTE — Telephone Encounter (Signed)
Pt states surgeon cannot see her right now because he cant do anything for her Requesting tramadol rx   Best phone [phone]

## 2013-01-23 NOTE — ED Notes (Signed)
Patient transported to MRI 

## 2013-01-23 NOTE — ED Provider Notes (Signed)
Patient care assumed from Junius Finner at shift changed with imaging pending.  Patient with hx of chronic back pain with multiple back surgeries, narcotic abuse, and neuropathy. MRI with evidence of small protrusion to the right of midline at L4-L5 without neural impingement. Also small left lateral disc protrusion at L3-L4 without neural impingement. No evidence of cauda equina on MRI. Patient ambulates in ED without assistance. Have reviewed the results with the patient who verbalizes understanding. Have discussed that there is no nerve impingement to explain the numbness in her left lower extremity. Have also had long talk with the patient about my suspicion of narcotic seeking behavior as patient has been to ED for back pain complaints 5 times in the last two months with 2 MRI scans of lumbar spine. Patient also with nursing history and knowledge of symptoms c/w emergent processes; have explained my distrust in her story given how fast her details changed between encounters with Mirian Capuchin and Dr. Rhunette Croft. During this conversation patient becomes increasingly tearful, sobbing stating that "she has no where else to go". Have firmly told the patient that the ED is not responsible for the management of chronic pain and that she needs to f/u with pain management at scheduled appointment. Patient requesting 19 days of narcotics which I have stated she will not get today. Have given strict d/c instructions to f/u with PCP and orthopedic surgeon. Indications for ED return discussed (i.e. If there is significant worsening or change in patient's symptoms). Patient appropriate for d/c. Patient with pain management appointment on 02/07/13.   Results for orders placed during the hospital encounter of 01/23/13  URINALYSIS, ROUTINE W REFLEX MICROSCOPIC      Result Value Range   Color, Urine YELLOW  YELLOW   APPearance CLEAR  CLEAR   Specific Gravity, Urine 1.009  1.005 - 1.030   pH 5.5  5.0 - 8.0   Glucose, UA  NEGATIVE  NEGATIVE mg/dL   Hgb urine dipstick NEGATIVE  NEGATIVE   Bilirubin Urine NEGATIVE  NEGATIVE   Ketones, ur 15 (*) NEGATIVE mg/dL   Protein, ur NEGATIVE  NEGATIVE mg/dL   Urobilinogen, UA 0.2  0.0 - 1.0 mg/dL   Nitrite NEGATIVE  NEGATIVE   Leukocytes, UA NEGATIVE  NEGATIVE  PREGNANCY, URINE      Result Value Range   Preg Test, Ur NEGATIVE  NEGATIVE   Mr Lumbar Spine Wo Contrast  01/23/2013   *RADIOLOGY REPORT*  Clinical Data: Low back pain with bowel and bladder incontinence. Left leg numbness.  MRI LUMBAR SPINE WITHOUT CONTRAST  Technique:  Multiplanar and multiecho pulse sequences of the lumbar spine were obtained without intravenous contrast.  Comparison: MRI dated 11/27/2012  Findings: Normal conus tip and distal thoracic spinal cord.  The tip is at L1-2.  Normal paraspinal soft tissues.  T10-11 through L2-3:  Normal.  L3-4:  Left far lateral as protrusion adjacent to but not compressing the left L3 nerve after it has exited the neural foramen, unchanged.  L4-5:  Small focal paracentral disc protrusion just to the right slightly indenting the thecal sac but causing no neural impingement, minimally more prominent. Moderate bilateral facet arthritis, stable.  L5-S1:  Solid posterior and interbody fusion.  No new or residual impingement.  IMPRESSION:  1.  Small disc protrusion just to the right of midline at L4-5 minimally more prominent but without neural impingement. 2. Small left far lateral disc protrusion at L3-4 without neural impingement.   Original Report Authenticated By: Francene Boyers,  M.D.    Antony Madura, PA-C 01/23/13 1729  Antony Madura, PA-C 01/24/13 [phone]

## 2013-01-23 NOTE — ED Notes (Signed)
Pt calling out insisting on pain medication. States she has a high pain tolerance and it usually takes 2 of dilaudid to bring her pain under control.

## 2013-01-23 NOTE — ED Notes (Signed)
25mL urine residual in bladder.

## 2013-01-23 NOTE — ED Notes (Signed)
Spoke to [employer] will put in order for pain medication and will see pt when MR results return

## 2013-01-23 NOTE — ED Notes (Signed)
Pt ambulated in hallways. Able to ambulate holding to rail with minimal effort.

## 2013-01-23 NOTE — ED Notes (Signed)
Per EMS pt from home with c/o tingling in left leg. Out of bed around 0830, fell on to left leg after getting up. Hx of same, but not as severe. No other neuro deficits. Hx of 6 back surgeries. VSS.

## 2013-01-23 NOTE — ED Provider Notes (Signed)
History    CSN: [id number] Arrival date & time 01/23/13  1240  First MD Initiated Contact with Patient 01/23/13 1316     Chief Complaint  Patient presents with  . Numbness   (Consider location/radiation/quality/duration/timing/severity/associated sxs/prior Treatment) HPI Pt is a 37yo female with chronic back pain with multiple back surgeries, narcotic abuse, and neuropathy.  Pt was BIB EMS today after a fall at home where she initially stepped on her left foot, then tripped over her left foot.  Denies hitting her head or LOC, however c/o worsening LBP and numbness in her left leg.  Pt states she was suppose to have surgery today with Dr. Shon Baton but when she called, the office told her there was nothing more he could do for her except refer her to another orthopedist or go to the ER for pain.  Pt states she has appointment with pain management July 15th but needs pain medication to last until then.  Denies fever, n/v/d however states she does have some loss of bowel when she coughs or sneezes.    Past Medical History  Diagnosis Date  . Narcotic abuse in remission   . Thyroid disease   . Chronic back pain   . Hypothyroidism   . Anxiety   . GERD (gastroesophageal reflux disease)   . Sleep apnea     cpap  . Headache(784.0)   . Neuromuscular disorder     neuropathy  . Cancer     vaginal  malignant carcinoma  . Arthritis     knees bilateral   DJD  stenosis   . Blood dyscrasia     HSV   Past Surgical History  Procedure Laterality Date  . Back surgery    . Cesarean section    . Tonsillectomy    . Tubal ligation    . Fracture surgery     Family History  Problem Relation Age of Onset  . Arthritis Mother   . Clotting disorder Mother   . Hyperlipidemia Father   . Leukemia Maternal Grandmother   . Diabetes Maternal Grandfather   . Stroke Paternal Grandmother    History  Substance Use Topics  . Smoking status: Current Every [place] Smoker -- 0.50 packs/[place] for 20 years   Types: Cigarettes  . Smokeless tobacco: Never Used  . Alcohol Use: No   OB History   Grav Para Term Preterm Abortions TAB SAB Ect Mult Living                 Review of Systems  Constitutional: Negative for fever, chills and fatigue.  Cardiovascular: Negative for chest pain.  Genitourinary: Negative for dysuria, frequency and flank pain.  Musculoskeletal: Positive for myalgias, back pain, arthralgias and gait problem. Negative for joint swelling.  All other systems reviewed and are negative.    Allergies  Avelox; Levofloxacin; Penicillins; and Quinolones  Home Medications   Current Outpatient Rx  Name  Route  Sig  Dispense  Refill  . levothyroxine (SYNTHROID, LEVOTHROID) 50 MCG tablet   Oral   Take 1 tablet (50 mcg total) by mouth daily.   90 tablet   0   . Multiple Vitamin (MULTIVITAMIN WITH MINERALS) TABS   Oral   Take 1 tablet by mouth daily.         Marland Kitchen PARoxetine (PAXIL) 40 MG tablet   Oral   Take 1 tablet (40 mg total) by mouth every morning.   90 tablet   3   . pregabalin (LYRICA) 200 MG  capsule   Oral   Take 1 capsule (200 mg total) by mouth 3 (three) times daily.   270 capsule   3   . diazepam (VALIUM) 5 MG tablet   Oral   Take 1 tablet (5 mg total) by mouth 2 (two) times daily.   10 tablet   0   . Oxycodone HCl 20 MG TABS   Oral   Take 1 tablet (20 mg total) by mouth 3 (three) times daily as needed.   10 tablet   0   . oxyCODONE-acetaminophen (PERCOCET/ROXICET) 5-325 MG per tablet   Oral   Take 1-2 tablets by mouth every 6 (six) hours as needed for pain.   20 tablet   0   . predniSONE (DELTASONE) 20 MG tablet   Oral   Take 2 tablets (40 mg total) by mouth daily.   10 tablet   0    BP 119/68  Pulse 107  Temp(Src) 97 F (36.1 C) (Oral)  Resp 16  SpO2 96% Physical Exam  Nursing note and vitals reviewed. Constitutional: She is oriented to person, place, and time. She appears well-developed and well-nourished. No distress.  Morbidly  obese female lying on exam bed. Appears in mild discomfort. NAD.  HENT:  Head: Normocephalic and atraumatic.  Eyes: Conjunctivae are normal. No scleral icterus.  Neck: Normal range of motion. Neck supple.  Cardiovascular: Normal rate, regular rhythm and normal heart sounds.   Pulmonary/Chest: Effort normal and breath sounds normal. No respiratory distress. She has no wheezes. She has no rales. She exhibits no tenderness.  Abdominal: Soft. Bowel sounds are normal. She exhibits no distension and no mass. There is no tenderness. There is no rebound and no guarding.  Musculoskeletal: Normal range of motion. She exhibits tenderness ( across lower lumbar muscles and paraspinal muscles). She exhibits no edema.       Lumbar back: She exhibits tenderness. She exhibits no bony tenderness.  TTP in lumbar region, pt able to stand but refuses to put weight on left leg due to fear of it "giving out"    Neurological: She is alert and oriented to person, place, and time. Gait abnormal. GCS eye subscore is 4. GCS verbal subscore is 5. GCS motor subscore is 6.  3/5 strength in left LE. 4/5 strength in right LE. Sensation of L5 and S1 dermatome of left foot impaired for sharp touch. Pt unable to ambulate due to pain and numbness in left leg.Refuses to bear weight on left leg   Skin: Skin is warm and dry. She is not diaphoretic.    ED Course  Procedures (including critical care time) Labs Reviewed  URINALYSIS, ROUTINE W REFLEX MICROSCOPIC - Abnormal; Notable for the following:    Ketones, ur 15 (*)    All other components within normal limits  PREGNANCY, URINE   Mr Lumbar Spine Wo Contrast  01/23/2013   *RADIOLOGY REPORT*  Clinical Data: Low back pain with bowel and bladder incontinence. Left leg numbness.  MRI LUMBAR SPINE WITHOUT CONTRAST  Technique:  Multiplanar and multiecho pulse sequences of the lumbar spine were obtained without intravenous contrast.  Comparison: MRI dated 11/27/2012  Findings: Normal  conus tip and distal thoracic spinal cord.  The tip is at L1-2.  Normal paraspinal soft tissues.  T10-11 through L2-3:  Normal.  L3-4:  Left far lateral as protrusion adjacent to but not compressing the left L3 nerve after it has exited the neural foramen, unchanged.  L4-5:  Small focal paracentral disc  protrusion just to the right slightly indenting the thecal sac but causing no neural impingement, minimally more prominent. Moderate bilateral facet arthritis, stable.  L5-S1:  Solid posterior and interbody fusion.  No new or residual impingement.  IMPRESSION:  1.  Small disc protrusion just to the right of midline at L4-5 minimally more prominent but without neural impingement. 2. Small left far lateral disc protrusion at L3-4 without neural impingement.   Original Report Authenticated By: Francene Boyers, M.D.   1. Chronic back pain   2. Numbness in left leg     MDM  Pt has extensive hx of chronic LBP and multiple spinal surgeries with narcotic abuse.  Discussed with Dr. Rhunette Croft who examined pt, who told him she was unable to void, which she had informed me prior, she was able to urinate w/o difficulty.  Do due to inability to ambulate, loss of sharp sensation in left foot, and questionable bowel/bladder incontinence, will get MRI of lumbar spine.  If no evidence of cauda equina, will discharge home to f/u with Dr. Merla Riches for better pain management.    Signed out to [facility] At shift change   Junius Finner, PA-C 01/24/13 1956   Shared service with midlevel provider. I have personally seen and examined the patient, providing direct face to face care, presenting with the chief complaint of back pain with LLE numbness, urinary retention and bowel incontinence. Physical exam findings include sensory deficits to the LLE and intact pulse. Plan will be MRI to r/o cord compression - as she has some symptoms that are concerning. I have reviewed the nursing documentation on past medical history,  family history, and social history.   Patient's MRI is negative. She works in the health care field, and i believe she was using the knowledge to her advantage to get narcotics. We gave her IM meds while in the ED. She has a pain f/u later in July. WE WILL NOT GIVE HER narcotic pain meds for more than 2-3 days. She has a PCP, and they have a long term relationship with the patient, and can treat her more effectively until she is seen by a paic specialist.   Derwood Kaplan, MD 01/24/13 2124

## 2013-01-24 ENCOUNTER — Ambulatory Visit (INDEPENDENT_AMBULATORY_CARE_PROVIDER_SITE_OTHER): Payer: Medicare Other | Admitting: Internal Medicine

## 2013-01-24 VITALS — BP 108/64 | HR 115 | Temp 98.0°F | Resp 16 | Ht 67.0 in | Wt 297.0 lb

## 2013-01-24 DIAGNOSIS — G8929 Other chronic pain: Secondary | ICD-10-CM

## 2013-01-24 DIAGNOSIS — M549 Dorsalgia, unspecified: Secondary | ICD-10-CM

## 2013-01-24 DIAGNOSIS — F1910 Other psychoactive substance abuse, uncomplicated: Secondary | ICD-10-CM

## 2013-01-24 DIAGNOSIS — F411 Generalized anxiety disorder: Secondary | ICD-10-CM

## 2013-01-24 MED ORDER — OXYCODONE HCL 20 MG PO TABS: 20.0000 mg | ORAL_TABLET | Freq: Three times a day (TID) | ORAL | Status: DC | PRN

## 2013-01-24 NOTE — ED Provider Notes (Signed)
Medical screening examination/treatment/procedure(s) were performed by non-physician practitioner and as supervising physician I was immediately available for consultation/collaboration.  Juliet Rude. Rubin Payor, MD 01/24/13 2956

## 2013-01-24 NOTE — Progress Notes (Signed)
Subjective:    Patient ID: [name], female    DOB: 1976/07/07, 37 y.o.   MRN: [id number]  HPIpatient sent to [place] as PCP from ER due to multiple recent visits due to acute on chronic back pain s/p surgery years ago.She was referred to see Dr Shon Baton by the ER to consider further interventions,  But she says they called her and said there was nothing else he could do and that she should come to her PCP to continue dilaudid for pain management-this is what the patient says today.  Most recently has been treated by Dr Ethelene Hal at [facility] ortho for this-injection was tried and was unsuccessful. I called Dr Ethelene Hal 7/1 and then spoke with PA Allen Derry on 7/3 who indicated that she only got dilaudid around the injection period, and they certainly did not suggest that she see me to continue this. In fact they do not want to refer her for pain management because they think the MRIs don't indicate an underlying cause that would require opiate therapy. Their office notes say that the patient told them that I wanted to take over her pain management, which of course is untrue.  She also has been seeing Dr Evelene Croon for management of substance abuse  with suboxone and GAD with paxil and clonazepam since her discharge from detox, BUT she stopped this about 1 month ago due to $$$-not covered by her insurance. Her story is that they will no longer see her because her insurance does not cover her visits. I Called the office and in fact she has a followup appointment scheduled on July 24, and that she has to pay $90 out of pocket for this visit which she has been doing for many months.   She is complaining of persistent low back pain with radiation to the left leg with numbness and weakness in the left leg. The pain is so severe she can't walk. She is in so much trouble maintaining sleep. The pain is exacerbating her generalized anxiety disorder and she would like to increase her doses of Klonopin and Paxil. She says that dilaudid is  the only medicine that has worked well for her pain.  Recent meds from [facility]= ---------- ----------------------------------- --------- ---- ------------ ---------- ---------- ------- ----- --------- 01/14/2013 HYDROMORPHONE 4 MG TABLET 30.000 2 [id number] 01/14/2013 [id number] N [id number]--- this prescription was written by me when she came in on Friday night or Saturday and said she needed enough medication to get back into see Dr. Ethelene Hal on Monday afternoon--- as it turns out she didn't have an appointment I don't believe.  01/12/2013 CLONAZEPAM 1 MG TABLET 90.000 30 [id number] 11/17/2012 [id number] R [id number]--EPPI 01/06/2013 HYDROMORPHONE 4 MG TABLET 60.000 15 [id number] 01/06/2013 [id number] N [id number]--HCWC Stone 12/30/2012 HYDROMORPHONE 4 MG TABLET 30.000 5 [id number] 12/30/2012 [id number] N [id number] --R Ramos 12/27/2012 LYRICA 200 MG CAPSULE 90.000 30 [id number] 07/30/2012 [id number] R [id number]--DP 12/19/2012 ZOLPIDEM TARTRATE 10 MG TABLET 45.000 30 [id number] 11/21/2012 [id number] R [id number]--PYKD 12/16/2012 HYDROMORPHONE 4 MG TABLET 60.000 15 [id number] 12/16/2012 9379024 N [id number]--MEQAS 12/15/2012 CLONAZEPAM 1 MG TABLET 90.000 30 [id number] 11/17/2012 4174081 R [id number]--SHFW 11/30/2012 SUBOXONE 8 MG-2 MG SL FILM 15.000 30 [id number] 11/30/2012 7867672 N [id number]--ZMOQ 11/27/2012 OXYCODONE-ACETAMINOPHEN 5-325 10.000 3 [id number] 11/27/2012 [id number] N [id number] PA nicloe piacotta ? WLER 11/26/2012 LYRICA 200 MG CAPSULE 90.000 30 [id number] 07/30/2012 7793903 R [id number]--MA 11/21/2012 ZOLPIDEM TARTRATE 10 MG TABLET 45.000 30 [id number] 11/21/2012 [id number] N [id number]--TDHR  11/17/2012 CLONAZEPAM 1 MG TABLET 90.000 30 [id number] 11/17/2012 [id number] N [id number] 11/12/2012 LYRICA 200 MG CAPSULE 15.000 5 [id number] 07/30/2012 [id number] R [id number] 11/08/2012 ALPRAZOLAM 1 MG TABLET 90.000  30 [id number] 11/08/2012 [id number] N [id number] 11/02/2012 SUBOXONE 8 MG-2 MG SL FILM 45.000 30 [id number] 11/02/2012 3762831 N [id number] 11/02/2012 CLONAZEPAM 0.5 MG TABLET 60.000 30 [id number] 11/02/2012 [id number] N [id number] 10/16/2012 LYRICA 200 MG CAPSULE 90.000 30 [id number] 07/30/2012 7782423 R [id number] 10/16/2012 ZOLPIDEM TARTRATE 10 MG TABLET 15.000 15 [id number] 10/05/2012 [id number] R [id number] 10/05/2012 ZOLPIDEM TARTRATE 10 MG TABLET 15.000 15 [id number] 10/05/2012 [id number] N [id number] 10/03/2012 SUBOXONE 8 MG-2 MG SL FILM 45.000 30 [id number] 10/03/2012 [id number] N [id number] 09/30/2012 SUBOXONE 8 MG-2 MG SL FILM 6.000 3 [id number] 09/30/2012 [id number] N [id number] 09/23/2012 LYRICA 200 MG CAPSULE 90.000 30 [id number] 07/30/2012 [id number] R [id number] 08/29/2012 SUBOXONE 8 MG-2 MG SL FILM 60.000 30 [id number] 08/29/2012 [id number] N [id number] 08/26/2012 LYRICA 200 MG CAPSULE 90.000 30 [id number] 07/30/2012 [id number] N [id number] 08/10/2012 ZOLPIDEM TARTRATE 10 MG TABLET 15.000 15 [id number] 07/30/2012 [id number] N [id number] 08/01/2012 SUBOXONE 8 MG-2 MG SL FILM 60.000 30 [id number] 08/01/2012 8280034 N [id number] 08/01/2012 LYRICA 200 MG CAPSULE 90.000 30 [id number] 07/30/2012 7867544 N [id number] 07/30/2012 ZOLPIDEM TARTRATE 10 MG TABLET 15.000 15 [id number] 07/30/2012 [id number] Dorris Carnes [id number]  Phone discussion 01/26/13 with [facility] revealed that she has no followup visit or establish new visit as she told us. She said her next visit there was August 3--their records show: D Smart PA at [facility] wrote for nucynta and tram 05/2012 because her UDS was inconsistent with their plans and they said no more opiates--so she terminated  ------------------------------------------------------------------------------------------------------------------------------- Past Surgical  History  Procedure Laterality Date  . Back surgery    . Cesarean section    . Tonsillectomy    . Tubal ligation    . Fracture surgery     Past Medical History  Diagnosis Date  . Narcotic abuse in remission   . Thyroid disease   . Chronic back pain   . Hypothyroidism   . Anxiety   . GERD (gastroesophageal reflux disease)   . Sleep apnea     cpap  . Headache(784.0)   . Neuromuscular disorder     neuropathy  . Cancer     vaginal  malignant carcinoma  . Arthritis     knees bilateral   DJD  stenosis   . Blood dyscrasia     HSV    She claims a new relationship/now living in an apartment away from her parents  Review of Systems No other symptoms contribute to this visit    Objective:   Physical Exam BP 108/64  Pulse 115  Temp(Src) 98 F (36.7 C) (Oral)  Resp 16  Ht 5\' 7"  (1.702 m)  Wt 297 lb (134.718 kg)  BMI 46.51 kg/m2  SpO2 98% She is crying throughout the exam taking for pain medication or better understanding of her serious condition Pulse varies depending on how anxious she is/heart is regular She is alert and oriented and in no acute distress She has positive straight leg raise on the left at 75 with pain in the left lumbar area Deep tendon reflexes are symmetrical She notes numbness to touch along the lateral aspect of the left leg both thigh and outer calf Gait is normal She has no suicidal ideation/there are no flights of ideas/no hallucinations or confusion  Assessment & Plan:  On 01/24/2013 I had not gathered much of the information subsequently noted in this office note I refused to prescribe dilaudid for her  While awaiting the call backs from Dr.Ramos and [facility] I gave her oxycodone 20 mg to use 3 times a Dosch until followup on 01/27/2013   At Followup I will confront her with her drug-seeking behavior, her deceitful history and will offer her the option for immediate admission for detox plus continued followup with her  psychiatrist. She has a relationship with [facility] and can certainly reestablish her own appointment there if she wishes. Because orthopedic consultation in this community feels that she does not have a diagnosis that would require chronic opiate therapy, I don't feel supported in any referral to pain management. If she wishes to pursue neurological evaluation regarding the numbness that she notices we'll be glad to set that up.  The office visit required 40 minutes and subsequent managing this case with on-call consultations required an additional 60 minutes.

## 2013-01-24 NOTE — Patient Instructions (Addendum)
---------- ----------------------------------- --------- ---- ------------ ---------- ---------- ------- ----- ---------   01/12/2013 CLONAZEPAM 1 MG TABLET 90.000 30 [id number] 11/17/2012 [id number] R [id number] 01/06/2013 HYDROMORPHONE 4 MG TABLET 60.000 15 [id number] 01/06/2013 [id number] N [id number] 12/30/2012 HYDROMORPHONE 4 MG TABLET 30.000 5 [id number] 12/30/2012 [id number] N [id number] 12/27/2012 LYRICA 200 MG CAPSULE 90.000 30 [id number] 07/30/2012 [id number] R [id number] 12/19/2012 ZOLPIDEM TARTRATE 10 MG TABLET 45.000 30 [id number] 11/21/2012 [id number] R [id number] 12/16/2012 HYDROMORPHONE 4 MG TABLET 60.000 15 [id number] 12/16/2012 [id number] N [id number] 12/15/2012 CLONAZEPAM 1 MG TABLET 90.000 30 [id number] 11/17/2012 [id number] R [id number] 11/30/2012 SUBOXONE 8 MG-2 MG SL FILM 15.000 30 [id number] 11/30/2012 [id number] N [id number] 11/27/2012 OXYCODONE-ACETAMINOPHEN 5-325 10.000 3 [id number] 11/27/2012 [id number] N [id number] 11/26/2012 LYRICA 200 MG CAPSULE 90.000 30 [id number] 07/30/2012 [id number] R [id number] 11/21/2012 ZOLPIDEM TARTRATE 10 MG TABLET 45.000 30 [id number] 11/21/2012 [id number] N [id number] 11/17/2012 CLONAZEPAM 1 MG TABLET 90.000 30 [id number] 11/17/2012 [id number] N [id number] 11/12/2012 LYRICA 200 MG CAPSULE 15.000 5 [id number] 07/30/2012 [id number] R [id number] 11/08/2012 ALPRAZOLAM 1 MG TABLET 90.000 30 [id number] 11/08/2012 [id number] N [id number] 11/02/2012 SUBOXONE 8 MG-2 MG SL FILM 45.000 30 [id number] 11/02/2012 [id number] N [id number] 11/02/2012 CLONAZEPAM 0.5 MG TABLET 60.000 30 [id number] 11/02/2012 9758832 N [id number] 10/16/2012 LYRICA 200 MG CAPSULE 90.000 30 [id number] 07/30/2012 [id number] R [id number] 10/16/2012 ZOLPIDEM TARTRATE 10 MG TABLET 15.000 15 [id number] 10/05/2012 [id number] R [id number] 10/05/2012 ZOLPIDEM TARTRATE 10 MG TABLET 15.000 15 [id number]  10/05/2012 [id number] N [id number] 10/03/2012 SUBOXONE 8 MG-2 MG SL FILM 45.000 30 [id number] 10/03/2012 [id number] N [id number] 09/30/2012 SUBOXONE 8 MG-2 MG SL FILM 6.000 3 [id number] 09/30/2012 [id number] N [id number] 09/23/2012 LYRICA 200 MG CAPSULE 90.000 30 [id number] 07/30/2012 [id number] R [id number] 08/29/2012 SUBOXONE 8 MG-2 MG SL FILM 60.000 30 [id number] 08/29/2012 [id number] N [id number] 08/26/2012 LYRICA 200 MG CAPSULE 90.000 30 [id number] 07/30/2012 [id number] N [id number] 08/10/2012 ZOLPIDEM TARTRATE 10 MG TABLET 15.000 15 [id number] 07/30/2012 [id number] N [id number] 08/01/2012 SUBOXONE 8 MG-2 MG SL FILM 60.000 30 [id number] 08/01/2012 [id number] N [id number] 01/14/2013 HYDROMORPHONE 4 MG TABLET 30.000 2 [id number] 01/14/2013 [id number] N [id number] 08/01/2012 LYRICA 200 MG CAPSULE 90.000 30 [id number] 07/30/2012 [id number] N [id number] 07/30/2012 ZOLPIDEM TARTRATE 10 MG TABLET 15.000 15 [id number] 07/30/2012 [id number] N [id number] *N/R N=New R=Refill Prescribers for prescriptions listed -------------------------------------------------------------------------------------------------------------------------------

## 2013-01-25 ENCOUNTER — Encounter: Payer: Self-pay | Admitting: Family Medicine

## 2013-01-27 ENCOUNTER — Ambulatory Visit (INDEPENDENT_AMBULATORY_CARE_PROVIDER_SITE_OTHER): Payer: Medicare Other | Admitting: Internal Medicine

## 2013-01-27 VITALS — BP 108/64 | HR 109 | Temp 98.0°F | Resp 17 | Ht 67.0 in | Wt 300.0 lb

## 2013-01-27 DIAGNOSIS — F39 Unspecified mood [affective] disorder: Secondary | ICD-10-CM

## 2013-01-27 DIAGNOSIS — F1190 Opioid use, unspecified, uncomplicated: Secondary | ICD-10-CM

## 2013-01-27 DIAGNOSIS — M549 Dorsalgia, unspecified: Secondary | ICD-10-CM

## 2013-01-27 MED ORDER — OXYCODONE HCL 5 MG PO TABS: ORAL_TABLET | ORAL | Status: DC

## 2013-01-27 MED ORDER — PROMETHAZINE HCL 25 MG PO TABS: 25.0000 mg | ORAL_TABLET | Freq: Three times a day (TID) | ORAL | Status: DC | PRN

## 2013-01-27 MED ORDER — DIAZEPAM 5 MG PO TABS: 5.0000 mg | ORAL_TABLET | Freq: Two times a day (BID) | ORAL | Status: DC

## 2013-01-27 MED ORDER — OXYCODONE HCL 10 MG PO TABS: ORAL_TABLET | ORAL | Status: DC

## 2013-01-28 ENCOUNTER — Encounter: Payer: Self-pay | Admitting: Internal Medicine

## 2013-01-28 NOTE — Progress Notes (Signed)
See last visit Patient Active Problem List   Diagnosis Date Noted  . Nicotine addiction 07/30/2012  . Fall down stairs 05/27/2012  . Foot fracture 05/27/2012  . Obesity, Class III, BMI 40-49.9 (morbid obesity) 10/31/2011  . Mood disorder 10/31/2011  . Hypothyroidism 10/31/2011  . Obstructive sleep apnea 10/31/2011  . Narcotic abuse in remission   . Chronic back pain    since her last visit I discussed the case with [facility] and [facility] Also with the office of Dr.Kaur I explained to her that currently he is orthopedics has no diagnosis which they feel would require chronic opiate therapy They are not recommending this for her although are not denying that she could have chronic pain, and just that they see no other way of treating this at this point. She is supposed to have an appointment with her psychiatrist on June 24 although she says they are not covered by her insurance and she cannot afford to go  We had a lengthy conversation about her predicament. Because of her past substance abuse and need for lengthy detoxification and treatment, she needs to be under the care of a specialist. She offers a reason that she cannot see all of the providers that I suggest-from Dr. Noel Gerold who did her surgery(she says he was inappropriate) to [facility](too many of the staff there are in her current NA group). I have provided her with a printout from her insurance company of available psychiatrists and substance abuse counselors and indicated that I will be glad to communicate and work with whomever she choses We will begin a slow withdrawal from opiates, and if she abuses the plan she will have to go to [facility] for detox and further treatment  Meds ordered this encounter  Medications  . Oxycodone HCl 10 MG TABS    Sig: 2 in am 1 midday and 2 hs for 3 days then 1 am 1 midday and 2 hs for 3 days then 1 tid for 3 days    Dispense:  36 tablet    Refill:  0  .  promethazine (PHENERGAN) 25 MG tablet    Sig: Take 1 tablet (25 mg total) by mouth every 8 (eight) hours as needed for nausea.    Dispense:  20 tablet    Refill:  0  . oxyCODONE (OXY IR/ROXICODONE) 5 MG immediate release tablet    Sig: 2 am 1 midday 2 hs for 3 d 1am 1 midday and 2 hs for 3 days 1 tid for 3 d, 1 bid for 3 d, 1 qd 3 days then discontinue To fill on or after 02/05/13    Dispense:  45 tablet    Refill:  0  . diazepam (VALIUM) 5 MG tablet    Sig: Take 1 tablet (5 mg total) by mouth 2 (two) times daily. For 02/01/13 or after    Dispense:  20 tablet    Refill:  0    She will f/u for other health issues as needed

## 2013-02-02 ENCOUNTER — Telehealth: Payer: Self-pay | Admitting: *Deleted

## 2013-02-02 NOTE — Telephone Encounter (Signed)
Patient called requesting a refill on her Oxycodone. She would like to pick it up by Friday June 12th states she no longer has any left. She can be reached at [phone]. Thanks

## 2013-02-02 NOTE — Telephone Encounter (Signed)
Read the ovs...she has enough with another rx. No exceptions.detox at [facility] if needed. Or refer to [facility].

## 2013-02-02 NOTE — Telephone Encounter (Signed)
Pt called and wanted get her rx for oxycodone 10mg  on Saturday instead of Sunday.  She only have 4 tablets left.  She stated that she may have messed up somewhere on her taper and not sure how or when. She is very anxious and does not want to go into withdrawels without this medication.  She insists that I handle this matter with a provider personally and also she wanted speak to a clinical manager or PA or Dr initially when she had called.

## 2013-02-02 NOTE — Telephone Encounter (Signed)
Be sure whoever responds reads the last 2 ov notes.

## 2013-02-02 NOTE — Telephone Encounter (Signed)
Patient has called multiple times, I have asked her not to call multiple times, advised her message has been sent. Patient VERY demanding/ angry on the phone. According to my records you gave her Rx to fill on 02/05/13. Do you want me to advise her to go to ER at [facility] for detox?

## 2013-02-02 NOTE — Telephone Encounter (Signed)
Thanks called her advised this will not be filled early she is to go to [facility] to detox if she does not feel like she can make it, or if she is concerned about withdrawals.

## 2013-02-02 NOTE — Telephone Encounter (Signed)
Yes. Or [facility]. Or her psychiatrist kaur. Or her prior pain management [place]

## 2013-03-01 ENCOUNTER — Other Ambulatory Visit: Payer: Self-pay | Admitting: Internal Medicine

## 2013-03-01 NOTE — Telephone Encounter (Signed)
Med encounter

## 2013-03-04 ENCOUNTER — Telehealth: Payer: Self-pay

## 2013-03-04 DIAGNOSIS — G8929 Other chronic pain: Secondary | ICD-10-CM

## 2013-03-04 NOTE — Telephone Encounter (Signed)
Pt would like to go down on milligram on her medication, three times a Abdo. [facility] [address]. Normally sees Merla Riches but wants to let Daub know since he is in this weekend.                  [phone].

## 2013-03-06 NOTE — Telephone Encounter (Signed)
Patient must wait for Dr Doolittle/ meds from him only.

## 2013-03-08 NOTE — Telephone Encounter (Signed)
She should be off all meds per the weaning process-we will no longer prescribe controlled substances for her-she will need to f/u with psychiatry as directed

## 2013-03-09 ENCOUNTER — Telehealth: Payer: Self-pay

## 2013-03-09 MED ORDER — PREGABALIN 200 MG PO CAPS: 200.0000 mg | ORAL_CAPSULE | Freq: Three times a day (TID) | ORAL | Status: DC

## 2013-03-09 NOTE — Telephone Encounter (Signed)
Patient called back, now she is asking for phenergan

## 2013-03-09 NOTE — Telephone Encounter (Signed)
Pt wants amy to know that the prescriptions was sent to the wrong place it needed to go to [facility]

## 2013-03-09 NOTE — Telephone Encounter (Signed)
I keep routing to Rx refill pool but it keeps sending it to Medical records pool? That's why ive routed it to clinical also

## 2013-03-09 NOTE — Telephone Encounter (Signed)
Was [facility] to [facility] [address]

## 2013-03-09 NOTE — Telephone Encounter (Signed)
Called her to advise. She is asking about her Lyrica, she wants Lyrica 200mg  she had been taking the 150 mg dose, but now wants to resume 200mg  pended/ please advise

## 2013-03-09 NOTE — Telephone Encounter (Signed)
Patient advised.

## 2013-03-09 NOTE — Telephone Encounter (Signed)
No She can transition to OTC meds like meclizine or whatever pharmacist suggests

## 2013-03-09 NOTE — Telephone Encounter (Signed)
Meds ordered this encounter  Medications  . pregabalin (LYRICA) 200 MG capsule    Sig: Take 1 capsule (200 mg total) by mouth 3 (three) times daily.    Dispense:  270 capsule    Refill:  3   Needs psychiatric and pain management f/u

## 2013-03-26 ENCOUNTER — Telehealth: Payer: Self-pay

## 2013-03-26 NOTE — Telephone Encounter (Signed)
Patient would like some Ambien prescribed to her and she would like [facility] [address]. Patient could be reached at [phone]. Thanks

## 2013-03-27 NOTE — Telephone Encounter (Signed)
Called her to advise, and she will try.

## 2013-03-27 NOTE — Telephone Encounter (Signed)
Remus Loffler too much like other benzos and can only be prescribed for her by a psychiatrist Could use valerian root 600mg  hs  Or melatonin 3mg  hs both otc

## 2013-03-27 NOTE — Telephone Encounter (Signed)
Please advise 

## 2013-04-10 ENCOUNTER — Emergency Department (HOSPITAL_COMMUNITY): Payer: Medicare Other

## 2013-04-10 ENCOUNTER — Encounter (HOSPITAL_COMMUNITY): Payer: Self-pay | Admitting: *Deleted

## 2013-04-10 ENCOUNTER — Emergency Department (HOSPITAL_COMMUNITY)
Admission: EM | Admit: 2013-04-10 | Discharge: 2013-04-10 | Disposition: A | Discharge status: Home / Self Care | Payer: Medicare Other | Attending: Emergency Medicine | Admitting: Emergency Medicine

## 2013-04-10 DIAGNOSIS — Z8719 Personal history of other diseases of the digestive system: Secondary | ICD-10-CM | POA: Insufficient documentation

## 2013-04-10 DIAGNOSIS — Z8742 Personal history of other diseases of the female genital tract: Secondary | ICD-10-CM | POA: Insufficient documentation

## 2013-04-10 DIAGNOSIS — G8929 Other chronic pain: Secondary | ICD-10-CM | POA: Insufficient documentation

## 2013-04-10 DIAGNOSIS — Z79899 Other long term (current) drug therapy: Secondary | ICD-10-CM | POA: Insufficient documentation

## 2013-04-10 DIAGNOSIS — R000 Tachycardia, unspecified: Secondary | ICD-10-CM | POA: Insufficient documentation

## 2013-04-10 DIAGNOSIS — F17200 Nicotine dependence, unspecified, uncomplicated: Secondary | ICD-10-CM | POA: Insufficient documentation

## 2013-04-10 DIAGNOSIS — R509 Fever, unspecified: Secondary | ICD-10-CM | POA: Insufficient documentation

## 2013-04-10 DIAGNOSIS — E039 Hypothyroidism, unspecified: Secondary | ICD-10-CM | POA: Insufficient documentation

## 2013-04-10 DIAGNOSIS — Z8739 Personal history of other diseases of the musculoskeletal system and connective tissue: Secondary | ICD-10-CM | POA: Insufficient documentation

## 2013-04-10 DIAGNOSIS — R112 Nausea with vomiting, unspecified: Secondary | ICD-10-CM | POA: Insufficient documentation

## 2013-04-10 DIAGNOSIS — Z880 Allergy status to penicillin: Secondary | ICD-10-CM | POA: Insufficient documentation

## 2013-04-10 DIAGNOSIS — IMO0002 Reserved for concepts with insufficient information to code with codable children: Secondary | ICD-10-CM | POA: Insufficient documentation

## 2013-04-10 DIAGNOSIS — Z8544 Personal history of malignant neoplasm of other female genital organs: Secondary | ICD-10-CM | POA: Insufficient documentation

## 2013-04-10 DIAGNOSIS — N390 Urinary tract infection, site not specified: Secondary | ICD-10-CM | POA: Insufficient documentation

## 2013-04-10 DIAGNOSIS — Z3202 Encounter for pregnancy test, result negative: Secondary | ICD-10-CM | POA: Insufficient documentation

## 2013-04-10 DIAGNOSIS — F411 Generalized anxiety disorder: Secondary | ICD-10-CM | POA: Insufficient documentation

## 2013-04-10 DIAGNOSIS — R197 Diarrhea, unspecified: Secondary | ICD-10-CM | POA: Insufficient documentation

## 2013-04-10 DIAGNOSIS — Z8659 Personal history of other mental and behavioral disorders: Secondary | ICD-10-CM | POA: Insufficient documentation

## 2013-04-10 DIAGNOSIS — R109 Unspecified abdominal pain: Secondary | ICD-10-CM

## 2013-04-10 DIAGNOSIS — R630 Anorexia: Secondary | ICD-10-CM | POA: Insufficient documentation

## 2013-04-10 DIAGNOSIS — Z862 Personal history of diseases of the blood and blood-forming organs and certain disorders involving the immune mechanism: Secondary | ICD-10-CM | POA: Insufficient documentation

## 2013-04-10 HISTORY — DX: Polycystic ovarian syndrome: E28.2

## 2013-04-10 LAB — URINALYSIS, ROUTINE W REFLEX MICROSCOPIC
Nitrite: POSITIVE — AB
Specific Gravity, Urine: 1.028 (ref 1.005–1.030)
Urobilinogen, UA: 0.2 mg/dL (ref 0.0–1.0)

## 2013-04-10 LAB — CBC WITH DIFFERENTIAL/PLATELET
Basophils Relative: 0 % (ref 0–1)
Eosinophils Absolute: 0.4 10*3/uL (ref 0.0–0.7)
Eosinophils Relative: 4 % (ref 0–5)
Hemoglobin: 15.9 g/dL — ABNORMAL HIGH (ref 12.0–15.0)
Lymphs Abs: 2 10*3/uL (ref 0.7–4.0)
MCH: 34.3 pg — ABNORMAL HIGH (ref 26.0–34.0)
MCHC: 36.1 g/dL — ABNORMAL HIGH (ref 30.0–36.0)
MCV: 95.2 fL (ref 78.0–100.0)
Monocytes Relative: 7 % (ref 3–12)
Platelets: 229 10*3/uL (ref 150–400)
RBC: 4.63 MIL/uL (ref 3.87–5.11)

## 2013-04-10 LAB — COMPREHENSIVE METABOLIC PANEL
BUN: 8 mg/dL (ref 6–23)
Calcium: 9.7 mg/dL (ref 8.4–10.5)
GFR calc Af Amer: 80 mL/min — ABNORMAL LOW (ref >90)
Glucose, Bld: 125 mg/dL — ABNORMAL HIGH (ref 70–99)
Total Protein: 7.1 g/dL (ref 6.0–8.3)

## 2013-04-10 LAB — URINE MICROSCOPIC-ADD ON

## 2013-04-10 LAB — POCT PREGNANCY, URINE: Preg Test, Ur: NEGATIVE

## 2013-04-10 LAB — LIPASE, BLOOD: Lipase: 17 U/L (ref 11–59)

## 2013-04-10 MED ORDER — ONDANSETRON 4 MG PO TBDP
8.0000 mg | ORAL_TABLET | Freq: Once | ORAL | Status: AC
  Administered 2013-04-10: 8 mg via ORAL
  Filled 2013-04-10: qty 2

## 2013-04-10 MED ORDER — ONDANSETRON HCL 4 MG/2ML IJ SOLN
4.0000 mg | Freq: Once | INTRAMUSCULAR | Status: AC
  Administered 2013-04-10: 4 mg via INTRAVENOUS
  Filled 2013-04-10: qty 2

## 2013-04-10 MED ORDER — FENTANYL CITRATE 0.05 MG/ML IJ SOLN
50.0000 ug | Freq: Once | INTRAMUSCULAR | Status: AC
  Administered 2013-04-10: 50 ug via INTRAVENOUS
  Filled 2013-04-10: qty 2

## 2013-04-10 MED ORDER — SULFAMETHOXAZOLE-TRIMETHOPRIM 800-160 MG PO TABS: 1.0000 | ORAL_TABLET | Freq: Two times a day (BID) | ORAL | Status: DC

## 2013-04-10 MED ORDER — KETOROLAC TROMETHAMINE 30 MG/ML IJ SOLN
30.0000 mg | Freq: Once | INTRAMUSCULAR | Status: AC
  Administered 2013-04-10: 30 mg via INTRAVENOUS
  Filled 2013-04-10: qty 1

## 2013-04-10 MED ORDER — DICYCLOMINE HCL 10 MG/ML IM SOLN
20.0000 mg | Freq: Once | INTRAMUSCULAR | Status: AC
  Administered 2013-04-10: 20 mg via INTRAMUSCULAR
  Filled 2013-04-10: qty 2

## 2013-04-10 MED ORDER — FENTANYL CITRATE 0.05 MG/ML IJ SOLN
100.0000 ug | Freq: Once | INTRAMUSCULAR | Status: AC
  Administered 2013-04-10: 100 ug via INTRAVENOUS
  Filled 2013-04-10: qty 2

## 2013-04-10 MED ORDER — NICOTINE 21 MG/24HR TD PT24
21.0000 mg | MEDICATED_PATCH | Freq: Every day | TRANSDERMAL | Status: DC
  Administered 2013-04-10: 21 mg via TRANSDERMAL
  Filled 2013-04-10: qty 1

## 2013-04-10 MED ORDER — ONDANSETRON HCL 4 MG PO TABS: 4.0000 mg | ORAL_TABLET | Freq: Four times a day (QID) | ORAL | Status: DC

## 2013-04-10 MED ORDER — SODIUM CHLORIDE 0.9 % IV BOLUS (SEPSIS)
1000.0000 mL | Freq: Once | INTRAVENOUS | Status: AC
  Administered 2013-04-10: 1000 mL via INTRAVENOUS

## 2013-04-10 NOTE — ED Notes (Signed)
Patient requesting pain medicine at this time- [name], Rochel Brome made aware of request.

## 2013-04-10 NOTE — ED Notes (Signed)
Patient transported to  A[facility] from Ultrasound.

## 2013-04-10 NOTE — ED Notes (Signed)
Pt to ED c/o emesis and diarrhea x 2 months.  Pt states that last night she began to experience intense RUQ pain that radiates to RLQ.  Pt tearful.

## 2013-04-10 NOTE — ED Provider Notes (Signed)
CSN: [id number]     Arrival date & time 04/10/13  1344 History   First MD Initiated Contact with Patient 04/10/13 1546     Chief Complaint  Patient presents with  . Abdominal Pain  . Diarrhea  . Emesis   (Consider location/radiation/quality/duration/timing/severity/associated sxs/prior Treatment) HPI Comments: 37 year old morbidly obese female with a past medical history of chronic back pain, narcotic abuse, anxiety and GERD presents to the emergency department complaining of gradual onset right upper quadrant abdominal pain radiating towards the left beginning last night. Pain has been constant since, described as sharp rated 10 out of 10. She has tried taking ibuprofen without relief. States she has been nauseous with vomiting and diarrhea for the past 2 months.  Has not had anything to eat in 3 days. Admits to associated subjective fever, however states she took ibuprofen prior to arrival. History of C-section, otherwise no other abdominal surgeries. Admits to decreased urine output because she has been dehydrated. No change in menstrual periods, last being 2 weeks ago and normal.  Patient is a 37 y.o. female presenting with abdominal pain, diarrhea, and vomiting. The history is provided by the patient.  Abdominal Pain Associated symptoms: diarrhea, fever, nausea and vomiting   Associated symptoms: no chills   Diarrhea Associated symptoms: abdominal pain, fever and vomiting   Associated symptoms: no chills   Emesis Associated symptoms: abdominal pain and diarrhea   Associated symptoms: no chills     Past Medical History  Diagnosis Date  . Narcotic abuse in remission   . Thyroid disease   . Chronic back pain   . Hypothyroidism   . Anxiety   . GERD (gastroesophageal reflux disease)   . Sleep apnea     cpap  . Headache(784.0)   . Neuromuscular disorder     neuropathy  . Arthritis     knees bilateral   DJD  stenosis   . Blood dyscrasia     HSV  . Cancer     vaginal  malignant  carcinoma  . PCOS (polycystic ovarian syndrome)    Past Surgical History  Procedure Laterality Date  . Back surgery    . Cesarean section    . Tonsillectomy    . Tubal ligation    . Fracture surgery     Family History  Problem Relation Age of Onset  . Arthritis Mother   . Clotting disorder Mother   . Hyperlipidemia Father   . Leukemia Maternal Grandmother   . Diabetes Maternal Grandfather   . Stroke Paternal Grandmother    History  Substance Use Topics  . Smoking status: Current Every [place] Smoker -- 0.50 packs/Zenner for 20 years    Types: Cigarettes  . Smokeless tobacco: Never Used  . Alcohol Use: No   OB History   Grav Para Term Preterm Abortions TAB SAB Ect Mult Living                 Review of Systems  Constitutional: Positive for fever and appetite change. Negative for chills and unexpected weight change.  Gastrointestinal: Positive for nausea, vomiting, abdominal pain and diarrhea.  Genitourinary: Positive for decreased urine volume.  All other systems reviewed and are negative.    Allergies  Avelox; Levofloxacin; Penicillins; and Quinolones  Home Medications   Current Outpatient Rx  Name  Route  Sig  Dispense  Refill  . diazepam (VALIUM) 5 MG tablet   Oral   Take 1 tablet (5 mg total) by mouth 2 (two) times daily.  For 02/01/13 or after   20 tablet   0   . levothyroxine (SYNTHROID, LEVOTHROID) 50 MCG tablet      TAKE 1 TABLET BY MOUTH EVERY Stream   90 tablet   0   . Multiple Vitamin (MULTIVITAMIN WITH MINERALS) TABS   Oral   Take 1 tablet by mouth daily.         Marland Kitchen oxyCODONE (OXY IR/ROXICODONE) 5 MG immediate release tablet      2 am 1 midday 2 hs for 3 d 1am 1 midday and 2 hs for 3 days 1 tid for 3 d, 1 bid for 3 d, 1 qd 3 days then discontinue To fill on or after 02/05/13   45 tablet   0   . Oxycodone HCl 10 MG TABS      2 in am 1 midday and 2 hs for 3 days then 1 am 1 midday and 2 hs for 3 days then 1 tid for 3 days   36 tablet   0   .  oxyCODONE-acetaminophen (PERCOCET/ROXICET) 5-325 MG per tablet   Oral   Take 1-2 tablets by mouth every 6 (six) hours as needed for pain.   20 tablet   0   . PARoxetine (PAXIL) 40 MG tablet   Oral   Take 1 tablet (40 mg total) by mouth every morning.   90 tablet   3   . predniSONE (DELTASONE) 20 MG tablet   Oral   Take 2 tablets (40 mg total) by mouth daily.   10 tablet   0   . pregabalin (LYRICA) 200 MG capsule   Oral   Take 1 capsule (200 mg total) by mouth 3 (three) times daily.   270 capsule   3   . promethazine (PHENERGAN) 25 MG tablet   Oral   Take 1 tablet (25 mg total) by mouth every 8 (eight) hours as needed for nausea.   20 tablet   0    BP 162/115  Pulse 125  Temp(Src) 98.5 F (36.9 C) (Oral)  Resp 24  Ht 5\' 7"  (1.702 m)  Wt 290 lb (131.543 kg)  BMI 45.41 kg/m2  SpO2 94%  LMP 03/27/2013 Physical Exam  Nursing note and vitals reviewed. Constitutional: She is oriented to person, place, and time. She appears well-developed. No distress.  Morbidly obese.  HENT:  Head: Normocephalic and atraumatic.  Mouth/Throat: Oropharynx is clear and moist.  Eyes: Conjunctivae are normal.  Neck: Normal range of motion. Neck supple.  Cardiovascular: Regular rhythm and normal heart sounds.  Tachycardia present.   Pulmonary/Chest: Effort normal and breath sounds normal.  Abdominal: Soft. Normal appearance and bowel sounds are normal. She exhibits no distension. There is tenderness in the right upper quadrant, right lower quadrant, epigastric area and left upper quadrant. There is no rigidity, no rebound, no guarding, no CVA tenderness and negative Murphy's sign.  No peritoneal signs. When auscultating for bowel sounds, patient does not grimace or complain of pain, however when palpating abdomen, she yells in pain, pushes hand away.  Musculoskeletal: Normal range of motion. She exhibits no edema.  Neurological: She is alert and oriented to person, place, and time.  Skin:  Skin is warm and dry. She is not diaphoretic.  Psychiatric: She has a normal mood and affect. Her behavior is normal.    ED Course  Procedures (including critical care time) Labs Review Labs Reviewed  CBC WITH DIFFERENTIAL - Abnormal; Notable for the following:    Hemoglobin  15.9 (*)    MCH 34.3 (*)    MCHC 36.1 (*)    All other components within normal limits  COMPREHENSIVE METABOLIC PANEL - Abnormal; Notable for the following:    Glucose, Bld 125 (*)    GFR calc non Af Amer 69 (*)    GFR calc Af Amer 80 (*)    All other components within normal limits  LIPASE, BLOOD  URINALYSIS, ROUTINE W REFLEX MICROSCOPIC   Imaging Review Ct Abdomen Pelvis Wo Contrast  04/10/2013   CLINICAL DATA:  Vomiting and diarrhea for 2 months. Intense right upper quadrant pain radiating to the right lower quadrant beginning the previous evening.  EXAM: CT ABDOMEN AND PELVIS WITHOUT CONTRAST  TECHNIQUE: Multidetector CT imaging of the abdomen and pelvis was performed following the standard protocol without intravenous contrast.  COMPARISON:  Abdomen ultrasound, 04/10/2013 abdomen and pelvis CT, 12/05/2010  FINDINGS: 6 mm pleural-based nodule along the in the diaphragmatic surface of the left lower lobe. The lung bases are otherwise clear. The heart is normal in size.  There is diffuse fatty infiltration of the liver. The liver is otherwise unremarkable.  Normal spleen, gallbladder and pancreas. No bile duct dilation. There are no adrenal masses.  Normal kidneys, ureters and bladder. The uterus and adnexa are unremarkable.  No adenopathy. There are no abnormal fluid collections.  Normal bowel. Normal appendix.  Previous L5-S1 posterior fusion. The fusion hardware is well-seated. Mild degenerative changes at L4-L5. Bony structures otherwise unremarkable.  IMPRESSION: No acute findings. No findings to explain the patient's right-sided abdominal pain, vomiting or diarrhea.  Fatty infiltration of the liver.  Postsurgical  changes from a fusion at L5-S1.  6 mm nodule at the base of the left lower lobe. If the patient is at high risk for bronchogenic carcinoma, follow-up chest CT at 6-12 months is recommended. If the patient is at low risk forbronchogenic carcinoma, follow-up chest CT at 12 months is recommended. This recommendation follows the consensus statement: Guidelines for Management of Small Pulmonary Nodules Detected on CT Scans: A Statement from the Fleischner Society as published in Radiology 2005;237:395-400.   Electronically Signed   By: Amie Portland   On: 04/10/2013 20:54   US Abdomen Complete  04/10/2013   CLINICAL DATA:  Right upper quadrant abdominal pain.  EXAM: ABDOMEN ULTRASOUND  COMPARISON:  No priors.  FINDINGS: Gallbladder  No gallstones identified. No evidence of biliary sludge. Normal wall thickness. No pericholecystic fluid. Per report from the sonographer, the patient did not exhibit a sonographic Murphy sign on examination.  Common bile duct  Diameter: Normal in caliber measuring 2.6 mm in the porta hepatis.  Liver  Diffusely echogenic hepatic parenchyma, suggestive of hepatic steatosis. No definite focal cystic or solid hepatic lesions. No intrahepatic biliary ductal dilatation. Normal hepatopetal flow within the portal vein.  IVC  No abnormality visualized.  Pancreas  Poorly visualized secondary to overlying bowel gas. Next healed  Spleen  Size and appearance within normal limits.  Right Kidney  Length: 10.8 cm. Echogenicity within normal limits. No mass or hydronephrosis visualized.  Left Kidney  Length: 11.1 cm. Echogenicity within normal limits. No mass or hydronephrosis visualized.  Abdominal aorta  No aneurysm visualized.  IMPRESSION: 1. No acute findings to account for the patient's symptoms. Specifically, no evidence of cholelithiasis or findings to suggest acute cholecystitis at this time. 2. Hepatic steatosis.   Electronically Signed   By: Trudie Reed M.D.   On: 04/10/2013 18:24    MDM  1. UTI (urinary tract infection)   2. Abdominal pain      Patient with RUQ abdominal pain along with 2 months of n/v/d. Hx of narcotic abuse. As soon as I entered room to examine patient, she says "oh good, finally, I need fluids and pain medicine, something like dilaudid". Labs obtained in triage prior to patient being seen, cbc, cmp, lipase and are unremarkable. When discussing this with patient, she states "how can my lipase be normal when my gallbladder hurts, that is wrong, my gall bladder has something wrong with it". I will obtain abdominal US to evaluate RUQ pain. UA pending. Toradol, IV fluids, zofran. 6:19 PM Patient reporting no improvement with toradol. Will give fentanyl. [place] pending. UA with UTI. 6:35 PM Abdominal US without any acute findings. Patient still reports she is uncomfortable, crying on repeat abdominal exam. Now complaining of right flank and back pain. Tender bilateral CVA which was not present initially. Will obtain CT to evaluate for possible kidney stones. Nephrolithiasis vs pyelo. No peritoneal signs. 7:48 PM Patient reports some improvement of pain with fentanyl. She is in NAD, appears comfortable. 9:04 PM DT not showing any acute abnormalities. I did discuss findings of pulmonary nodule which she will discuss with her primary care physician. I will treat her urinary tract infection with Bactrim as she is allergic to penicillin and fluoroquinolones. She is stable for discharge. After discussing CT findings, patient is requesting to be discharged with a prescription for Ambien and Phenergan, however I told her that these need to be prescribed by her primary care physician. I will discharge her with antibiotics and Zofran. Return precautions discussed. Patient states understanding of plan and is agreeable.  Trevor Mace, PA-C 04/10/13 2108

## 2013-04-10 NOTE — ED Notes (Signed)
PA at bedside.

## 2013-04-11 ENCOUNTER — Telehealth: Payer: Self-pay

## 2013-04-11 LAB — URINE CULTURE: Colony Count: >=100000

## 2013-04-11 NOTE — ED Provider Notes (Signed)
Medical screening examination/treatment/procedure(s) were performed by non-physician practitioner and as supervising physician I was immediately available for consultation/collaboration.  Demika Langenderfer L Norina Cowper, MD 04/11/13 1457 

## 2013-04-11 NOTE — Telephone Encounter (Signed)
Patient states she would like phenogren instead of Zofran. After she hung up I realized this was not prescribed by Dr. Merla Riches but by someone at hospital. Don't know if we can do anything for her.

## 2013-04-13 ENCOUNTER — Emergency Department (HOSPITAL_COMMUNITY)
Admission: EM | Admit: 2013-04-13 | Discharge: 2013-04-15 | Disposition: A | Discharge status: Home / Self Care | Payer: Medicare Other | Attending: Emergency Medicine | Admitting: Emergency Medicine

## 2013-04-13 ENCOUNTER — Emergency Department (HOSPITAL_COMMUNITY): Payer: Medicare Other

## 2013-04-13 ENCOUNTER — Encounter (HOSPITAL_COMMUNITY): Payer: Self-pay | Admitting: Emergency Medicine

## 2013-04-13 DIAGNOSIS — Z862 Personal history of diseases of the blood and blood-forming organs and certain disorders involving the immune mechanism: Secondary | ICD-10-CM | POA: Insufficient documentation

## 2013-04-13 DIAGNOSIS — Z8719 Personal history of other diseases of the digestive system: Secondary | ICD-10-CM | POA: Insufficient documentation

## 2013-04-13 DIAGNOSIS — F17200 Nicotine dependence, unspecified, uncomplicated: Secondary | ICD-10-CM | POA: Insufficient documentation

## 2013-04-13 DIAGNOSIS — Z8739 Personal history of other diseases of the musculoskeletal system and connective tissue: Secondary | ICD-10-CM | POA: Insufficient documentation

## 2013-04-13 DIAGNOSIS — F4310 Post-traumatic stress disorder, unspecified: Secondary | ICD-10-CM

## 2013-04-13 DIAGNOSIS — Z8544 Personal history of malignant neoplasm of other female genital organs: Secondary | ICD-10-CM | POA: Insufficient documentation

## 2013-04-13 DIAGNOSIS — F1910 Other psychoactive substance abuse, uncomplicated: Secondary | ICD-10-CM

## 2013-04-13 DIAGNOSIS — E039 Hypothyroidism, unspecified: Secondary | ICD-10-CM | POA: Insufficient documentation

## 2013-04-13 DIAGNOSIS — F411 Generalized anxiety disorder: Secondary | ICD-10-CM

## 2013-04-13 DIAGNOSIS — Z8669 Personal history of other diseases of the nervous system and sense organs: Secondary | ICD-10-CM | POA: Insufficient documentation

## 2013-04-13 DIAGNOSIS — Z79899 Other long term (current) drug therapy: Secondary | ICD-10-CM | POA: Insufficient documentation

## 2013-04-13 DIAGNOSIS — Z3202 Encounter for pregnancy test, result negative: Secondary | ICD-10-CM | POA: Insufficient documentation

## 2013-04-13 DIAGNOSIS — Z8639 Personal history of other endocrine, nutritional and metabolic disease: Secondary | ICD-10-CM | POA: Insufficient documentation

## 2013-04-13 LAB — POCT PREGNANCY, URINE: Preg Test, Ur: NEGATIVE

## 2013-04-13 LAB — CBC WITH DIFFERENTIAL/PLATELET
Basophils Absolute: 0 10*3/uL (ref 0.0–0.1)
HCT: 41.8 % (ref 36.0–46.0)
Hemoglobin: 14.7 g/dL (ref 12.0–15.0)
Lymphocytes Relative: 14 % (ref 12–46)
Monocytes Absolute: 0.7 10*3/uL (ref 0.1–1.0)
Neutro Abs: 7 10*3/uL (ref 1.7–7.7)
RDW: 14.2 % (ref 11.5–15.5)
WBC: 9 10*3/uL (ref 4.0–10.5)

## 2013-04-13 LAB — BASIC METABOLIC PANEL
CO2: 22 mEq/L (ref 19–32)
Chloride: 104 mEq/L (ref 96–112)
Creatinine, Ser: 0.99 mg/dL (ref 0.50–1.10)

## 2013-04-13 LAB — ACETAMINOPHEN LEVEL: Acetaminophen (Tylenol), Serum: <15 ug/mL (ref 10–30)

## 2013-04-13 LAB — SALICYLATE LEVEL: Salicylate Lvl: <2 mg/dL — ABNORMAL LOW (ref 2.8–20.0)

## 2013-04-13 LAB — RAPID URINE DRUG SCREEN, HOSP PERFORMED: Barbiturates: NOT DETECTED

## 2013-04-13 LAB — ETHANOL: Alcohol, Ethyl (B): <11 mg/dL (ref 0–11)

## 2013-04-13 MED ORDER — QUETIAPINE FUMARATE ER 50 MG PO TB24
50.0000 mg | ORAL_TABLET | Freq: Every day | ORAL | Status: DC
  Administered 2013-04-13 – 2013-04-14 (×2): 50 mg via ORAL
  Filled 2013-04-13 (×4): qty 1

## 2013-04-13 MED ORDER — PAROXETINE HCL 20 MG PO TABS
40.0000 mg | ORAL_TABLET | Freq: Every day | ORAL | Status: DC
  Administered 2013-04-14 – 2013-04-15 (×2): 40 mg via ORAL
  Filled 2013-04-13 (×2): qty 2

## 2013-04-13 MED ORDER — ONDANSETRON HCL 4 MG PO TABS
4.0000 mg | ORAL_TABLET | Freq: Four times a day (QID) | ORAL | Status: DC
  Administered 2013-04-13 – 2013-04-15 (×6): 4 mg via ORAL
  Filled 2013-04-13 (×6): qty 1

## 2013-04-13 MED ORDER — SULFAMETHOXAZOLE-TRIMETHOPRIM 800-160 MG PO TABS
1.0000 | ORAL_TABLET | Freq: Two times a day (BID) | ORAL | Status: AC
  Administered 2013-04-13 – 2013-04-14 (×2): 1 via ORAL
  Filled 2013-04-13 (×2): qty 1

## 2013-04-13 MED ORDER — LORAZEPAM 1 MG PO TABS
1.0000 mg | ORAL_TABLET | Freq: Three times a day (TID) | ORAL | Status: DC | PRN
  Administered 2013-04-13: 1 mg via ORAL
  Filled 2013-04-13: qty 1

## 2013-04-13 MED ORDER — LORAZEPAM 2 MG/ML IJ SOLN
1.0000 mg | Freq: Once | INTRAMUSCULAR | Status: AC
  Administered 2013-04-13: 1 mg via INTRAVENOUS
  Filled 2013-04-13: qty 1

## 2013-04-13 MED ORDER — NICOTINE 21 MG/24HR TD PT24
21.0000 mg | MEDICATED_PATCH | Freq: Once | TRANSDERMAL | Status: AC
  Administered 2013-04-13 – 2013-04-14 (×2): 21 mg via TRANSDERMAL
  Filled 2013-04-13: qty 1

## 2013-04-13 MED ORDER — QUETIAPINE FUMARATE 25 MG PO TABS: 25.0000 mg | ORAL_TABLET | Freq: Two times a day (BID) | ORAL | Status: DC

## 2013-04-13 MED ORDER — LEVOTHYROXINE SODIUM 50 MCG PO TABS
50.0000 ug | ORAL_TABLET | Freq: Every day | ORAL | Status: DC
  Administered 2013-04-14 – 2013-04-15 (×2): 50 ug via ORAL
  Filled 2013-04-13 (×3): qty 1

## 2013-04-13 MED ORDER — QUETIAPINE FUMARATE ER 50 MG PO TB24: 50.0000 mg | ORAL_TABLET | Freq: Every day | ORAL | Status: DC

## 2013-04-13 MED ORDER — HYDROXYZINE HCL 25 MG PO TABS
25.0000 mg | ORAL_TABLET | Freq: Four times a day (QID) | ORAL | Status: DC | PRN
  Administered 2013-04-13 – 2013-04-15 (×6): 25 mg via ORAL
  Filled 2013-04-13 (×6): qty 1

## 2013-04-13 MED ORDER — PREGABALIN 50 MG PO CAPS
200.0000 mg | ORAL_CAPSULE | Freq: Three times a day (TID) | ORAL | Status: DC
  Administered 2013-04-13 – 2013-04-15 (×6): 200 mg via ORAL
  Filled 2013-04-13 (×6): qty 4

## 2013-04-13 NOTE — ED Notes (Signed)
Per EMS: hx of anxiety,  Has been off meds for about a month, tried to cut self last night superficial laceration to both arm. 20 g left hand 4 mg Zofran.

## 2013-04-13 NOTE — ED Notes (Addendum)
RN informed NP of request; new orders received for Vistaril as needed. RN entered pt's room with Vistaril, pt tearful and immediately states "That doesn't work! They give me Phenobarbital for detox at [facility], that's why I always go there, they help you!". Pt states "Why can't they just give me benzos while I'm here?". Pt reluctantly took Vistaril and RN asked pt to try to get some rest and let the medication work.

## 2013-04-13 NOTE — Progress Notes (Addendum)
CSW spoke with Dr. Carie Caddy office who stated patient was dismissed from their practice due to receiving controlled substances from various providers. Per discussion with psychiatrist and psychiatric np, patient is recommended for CDIOP. NP to discuss treatment options for anxiety and recommendation. CSW left message with CDIOP to arrange if patient is agreeable.   Catha Gosselin, LCSW [phone]  ED CSW .04/13/2013 1453pm   Pt shared that she can't function like this at home. CSW discussed concerns with seroquel with NP as discussed. NP and psychiatrist agree to refer patient to outpatient services with seroquel at this time.   Frutoso Schatz [phone]  ED CSW 04/13/2013 1453pm   CSW discussed with patient, who requested to speak with NP. NP consulting with PSychiatrist. Per RN CM patient insurance does not cover seroquel only seroquel XR and it has a high co pay. RN aware. NP to follow up. Patient shared she felt like she may hurt herself if she goes home or atleast drink. Vague SI. CSW shared with NP. NP changed prescription and is bringing to pt rn.   .No further Clinical Social Work needs, signing off.   Catha Gosselin, LCSW [phone]  ED CSW .04/13/2013 1514

## 2013-04-13 NOTE — ED Provider Notes (Signed)
CSN: [id number]     Arrival date & time 04/13/13  1102 History   First MD Initiated Contact with Patient 04/13/13 1107     Chief Complaint  Patient presents with  . Anxiety   (Consider location/radiation/quality/duration/timing/severity/associated sxs/prior Treatment) HPI Comments: Patient presents emergency department with chief complaint of anxiety. She states that she has been very anxious for the past several months. She states that she's been off her medications for about a month. She states that last night she became so anxious that she began to cut herself. She states that her intent was not to kill herself but alleviate her anxiety. She denies SI or HI. She is requesting evaluation for inpatient placement.  The history is provided by the patient. No language interpreter was used.    Past Medical History  Diagnosis Date  . Narcotic abuse in remission   . Thyroid disease   . Chronic back pain   . Hypothyroidism   . Anxiety   . GERD (gastroesophageal reflux disease)   . Sleep apnea     cpap  . Headache(784.0)   . Neuromuscular disorder     neuropathy  . Arthritis     knees bilateral   DJD  stenosis   . Blood dyscrasia     HSV  . Cancer     vaginal  malignant carcinoma  . PCOS (polycystic ovarian syndrome)    Past Surgical History  Procedure Laterality Date  . Back surgery    . Cesarean section    . Tonsillectomy    . Tubal ligation    . Fracture surgery     Family History  Problem Relation Age of Onset  . Arthritis Mother   . Clotting disorder Mother   . Hyperlipidemia Father   . Leukemia Maternal Grandmother   . Diabetes Maternal Grandfather   . Stroke Paternal Grandmother    History  Substance Use Topics  . Smoking status: Current Every [employer] Smoker -- 0.50 packs/[place] for 20 years    Types: Cigarettes  . Smokeless tobacco: Never Used  . Alcohol Use: No   OB History   Grav Para Term Preterm Abortions TAB SAB Ect Mult Living                 Review of  Systems  All other systems reviewed and are negative.    Allergies  Avelox; Levofloxacin; Penicillins; and Quinolones  Home Medications   Current Outpatient Rx  Name  Route  Sig  Dispense  Refill  . ibuprofen (ADVIL,MOTRIN) 200 MG tablet   Oral   Take 800 mg by mouth every 6 (six) hours as needed for pain.         Marland Kitchen levothyroxine (SYNTHROID, LEVOTHROID) 50 MCG tablet   Oral   Take 50 mcg by mouth daily before breakfast.         . ondansetron (ZOFRAN) 4 MG tablet   Oral   Take 1 tablet (4 mg total) by mouth every 6 (six) hours.   12 tablet   0   . PARoxetine (PAXIL) 40 MG tablet   Oral   Take 1 tablet (40 mg total) by mouth every morning.   90 tablet   3   . pregabalin (LYRICA) 200 MG capsule   Oral   Take 1 capsule (200 mg total) by mouth 3 (three) times daily.   270 capsule   3   . sulfamethoxazole-trimethoprim (SEPTRA DS) 800-160 MG per tablet   Oral   Take 1  tablet by mouth 2 (two) times daily.   14 tablet   0    BP 150/89  Pulse 107  Temp(Src) 98.8 F (37.1 C) (Oral)  Resp 25  SpO2 98%  LMP 03/30/2013 Physical Exam  Nursing note and vitals reviewed. Constitutional: She is oriented to person, place, and time. She appears well-developed and well-nourished.  HENT:  Head: Normocephalic and atraumatic.  Eyes: Conjunctivae and EOM are normal. Pupils are equal, round, and reactive to light.  Neck: Normal range of motion. Neck supple.  Cardiovascular: Normal rate and regular rhythm.  Exam reveals no gallop and no friction rub.   No murmur heard. Pulmonary/Chest: Effort normal and breath sounds normal. No respiratory distress. She has no wheezes. She has no rales. She exhibits no tenderness.  Abdominal: Soft. Bowel sounds are normal. She exhibits no distension and no mass. There is no tenderness. There is no rebound and no guarding.  Musculoskeletal: Normal range of motion. She exhibits no edema and no tenderness.  Neurological: She is alert and oriented  to person, place, and time.  Skin: Skin is warm and dry.  Multiple shallow lacerations to the anterior forearms, nothing requiring repair  Psychiatric: Judgment and thought content normal.  Anxious appearing, tearful at times    ED Course  Procedures (including critical care time) Labs Review Labs Reviewed  CBC WITH DIFFERENTIAL  URINE RAPID DRUG SCREEN (HOSP PERFORMED)  BASIC METABOLIC PANEL  ETHANOL  ACETAMINOPHEN LEVEL  SALICYLATE LEVEL   Results for orders placed during the hospital encounter of 04/13/13  URINE RAPID DRUG SCREEN (HOSP PERFORMED)      Result Value Range   Opiates NONE DETECTED  NONE DETECTED   Cocaine POSITIVE (*) NONE DETECTED   Benzodiazepines NONE DETECTED  NONE DETECTED   Amphetamines NONE DETECTED  NONE DETECTED   Tetrahydrocannabinol NONE DETECTED  NONE DETECTED   Barbiturates NONE DETECTED  NONE DETECTED  CBC WITH DIFFERENTIAL      Result Value Range   WBC 9.0  4.0 - 10.5 K/uL   RBC 4.39  3.87 - 5.11 MIL/uL   Hemoglobin 14.7  12.0 - 15.0 g/dL   HCT 16.1  09.6 - 04.5 %   MCV 95.2  78.0 - 100.0 fL   MCH 33.5  26.0 - 34.0 pg   MCHC 35.2  30.0 - 36.0 g/dL   RDW 40.9  81.1 - 91.4 %   Platelets 192  150 - 400 K/uL   Neutrophils Relative % 77  43 - 77 %   Neutro Abs 7.0  1.7 - 7.7 K/uL   Lymphocytes Relative 14  12 - 46 %   Lymphs Abs 1.2  0.7 - 4.0 K/uL   Monocytes Relative 7  3 - 12 %   Monocytes Absolute 0.7  0.1 - 1.0 K/uL   Eosinophils Relative 1  0 - 5 %   Eosinophils Absolute 0.1  0.0 - 0.7 K/uL   Basophils Relative 0  0 - 1 %   Basophils Absolute 0.0  0.0 - 0.1 K/uL  BASIC METABOLIC PANEL      Result Value Range   Sodium 139  135 - 145 mEq/L   Potassium 3.9  3.5 - 5.1 mEq/L   Chloride 104  96 - 112 mEq/L   CO2 22  19 - 32 mEq/L   Glucose, Bld 104 (*) 70 - 99 mg/dL   BUN 6  6 - 23 mg/dL   Creatinine, Ser 7.82  0.50 - 1.10 mg/dL  Calcium 9.1  8.4 - 10.5 mg/dL   GFR calc non Af Amer 71 (*) >90 mL/min   GFR calc Af Amer 83 (*) >90  mL/min  ETHANOL      Result Value Range   Alcohol, Ethyl (B) <11  0 - 11 mg/dL  ACETAMINOPHEN LEVEL      Result Value Range   Acetaminophen (Tylenol), Serum <15.0  10 - 30 ug/mL  SALICYLATE LEVEL      Result Value Range   Salicylate Lvl <2.0 (*) 2.8 - 20.0 mg/dL  POCT PREGNANCY, URINE      Result Value Range   Preg Test, Ur NEGATIVE  NEGATIVE   Ct Abdomen Pelvis Wo Contrast  04/10/2013   CLINICAL DATA:  Vomiting and diarrhea for 2 months. Intense right upper quadrant pain radiating to the right lower quadrant beginning the previous evening.  EXAM: CT ABDOMEN AND PELVIS WITHOUT CONTRAST  TECHNIQUE: Multidetector CT imaging of the abdomen and pelvis was performed following the standard protocol without intravenous contrast.  COMPARISON:  Abdomen ultrasound, 04/10/2013 abdomen and pelvis CT, 12/05/2010  FINDINGS: 6 mm pleural-based nodule along the in the diaphragmatic surface of the left lower lobe. The lung bases are otherwise clear. The heart is normal in size.  There is diffuse fatty infiltration of the liver. The liver is otherwise unremarkable.  Normal spleen, gallbladder and pancreas. No bile duct dilation. There are no adrenal masses.  Normal kidneys, ureters and bladder. The uterus and adnexa are unremarkable.  No adenopathy. There are no abnormal fluid collections.  Normal bowel. Normal appendix.  Previous L5-S1 posterior fusion. The fusion hardware is well-seated. Mild degenerative changes at L4-L5. Bony structures otherwise unremarkable.  IMPRESSION: No acute findings. No findings to explain the patient's right-sided abdominal pain, vomiting or diarrhea.  Fatty infiltration of the liver.  Postsurgical changes from a fusion at L5-S1.  6 mm nodule at the base of the left lower lobe. If the patient is at high risk for bronchogenic carcinoma, follow-up chest CT at 6-12 months is recommended. If the patient is at low risk forbronchogenic carcinoma, follow-up chest CT at 12 months is recommended.  This recommendation follows the consensus statement: Guidelines for Management of Small Pulmonary Nodules Detected on CT Scans: A Statement from the Fleischner Society as published in Radiology 2005;237:395-400.   Electronically Signed   By: Amie Portland   On: 04/10/2013 20:54   Dg Chest 2 View  04/13/2013   CLINICAL DATA:  Shortness of Breath, anxiety.  EXAM: CHEST  2 VIEW  COMPARISON:  02/12/2011  FINDINGS: The heart size and mediastinal contours are within normal limits. Both lungs are clear. The visualized skeletal structures are unremarkable.  IMPRESSION: No active cardiopulmonary disease.   Electronically Signed   By: Charlett Nose M.D.   On: 04/13/2013 12:32   US Abdomen Complete  04/10/2013   CLINICAL DATA:  Right upper quadrant abdominal pain.  EXAM: ABDOMEN ULTRASOUND  COMPARISON:  No priors.  FINDINGS: Gallbladder  No gallstones identified. No evidence of biliary sludge. Normal wall thickness. No pericholecystic fluid. Per report from the sonographer, the patient did not exhibit a sonographic Murphy sign on examination.  Common bile duct  Diameter: Normal in caliber measuring 2.6 mm in the porta hepatis.  Liver  Diffusely echogenic hepatic parenchyma, suggestive of hepatic steatosis. No definite focal cystic or solid hepatic lesions. No intrahepatic biliary ductal dilatation. Normal hepatopetal flow within the portal vein.  IVC  No abnormality visualized.  Pancreas  Poorly visualized  secondary to overlying bowel gas. Next healed  Spleen  Size and appearance within normal limits.  Right Kidney  Length: 10.8 cm. Echogenicity within normal limits. No mass or hydronephrosis visualized.  Left Kidney  Length: 11.1 cm. Echogenicity within normal limits. No mass or hydronephrosis visualized.  Abdominal aorta  No aneurysm visualized.  IMPRESSION: 1. No acute findings to account for the patient's symptoms. Specifically, no evidence of cholelithiasis or findings to suggest acute cholecystitis at this time. 2.  Hepatic steatosis.   Electronically Signed   By: Trudie Reed M.D.   On: 04/10/2013 18:24      MDM   1. Polysubstance abuse   2. GAD (generalized anxiety disorder)     Patient with severe anxiety. Will give a dose of Ativan, and will consult TTS.  Patient discussed with the psych nurse practitioner, who will observe the patient overnight, discharged in the morning. She tells me that as she was preparing to discharge the patient, the patient stated that she might kill herself if she went home.  Plan, reassess in the morning.    Roxy Horseman, PA-C 04/13/13 [phone]

## 2013-04-13 NOTE — Telephone Encounter (Signed)
zofran best cause no cns depression

## 2013-04-13 NOTE — BH Assessment (Addendum)
Assessment Note  [name] is an 37 y.o. female who presents to the ED requesting inpatient treatment for severe anxiety. Pt reports upon arrival anxiety was 10/10 being the worst. Patient given ativan and has reduced anxiety to a 7. Pt reports last night she drank half of 1/5 of vodka trying to relieve her anxiety, and made several superficial cuts to both forearms. Patient reports she is not suicidial or homicidial but can not function and can not continue to live like this. Pt reports she doesn't trust herself when she gets anxious.  Patient reports that she was seeing Dr. Evelene Croon, however Dr. Evelene Croon no longer acepts her insurance and has not been able to get her klonopin refilled. Patient reports she was taking klonopin 1mg  tid.Pt reports that she is feeling more depressed due to the increased anxiety. Pt reprots she feels hopeless, isolating herself, feels guilty (for the anxiety), and has decreased appetite. Pt reports waking up anxious through out the night.   Patient reports that she has a daughter who she have 50% custody of with her exhusband. Patient reports taht her daughter is with her grandmother after school. Pt reports that she takes her other medications has prescribed. Pt currently only SSI due to a back injury related to a horse back riding accident years ago.   Per chart review, pt tested positive for cocaine, however denies any recent use. Pt reports cocaine use in college.   Pt referred to psychiatric NP for further evaluation to determine disposition.    Axis I: Anxiety disorder nos, depressive disorder nos Axis II: Deferred Axis III:  Past Medical History  Diagnosis Date  . Narcotic abuse in remission   . Thyroid disease   . Chronic back pain   . Hypothyroidism   . Anxiety   . GERD (gastroesophageal reflux disease)   . Sleep apnea     cpap  . Headache(784.0)   . Neuromuscular disorder     neuropathy  . Arthritis     knees bilateral   DJD  stenosis   . Blood  dyscrasia     HSV  . Cancer     vaginal  malignant carcinoma  . PCOS (polycystic ovarian syndrome)    Axis IV: other psychosocial or environmental problems, problems related to social environment, problems with access to health care services and problems with primary support group Axis V: 11-20 some danger of hurting self or others possible OR occasionally fails to maintain minimal personal hygiene OR gross impairment in communication  Past Medical History:  Past Medical History  Diagnosis Date  . Narcotic abuse in remission   . Thyroid disease   . Chronic back pain   . Hypothyroidism   . Anxiety   . GERD (gastroesophageal reflux disease)   . Sleep apnea     cpap  . Headache(784.0)   . Neuromuscular disorder     neuropathy  . Arthritis     knees bilateral   DJD  stenosis   . Blood dyscrasia     HSV  . Cancer     vaginal  malignant carcinoma  . PCOS (polycystic ovarian syndrome)     Past Surgical History  Procedure Laterality Date  . Back surgery    . Cesarean section    . Tonsillectomy    . Tubal ligation    . Fracture surgery      Family History:  Family History  Problem Relation Age of Onset  . Arthritis Mother   . Clotting disorder  Mother   . Hyperlipidemia Father   . Leukemia Maternal Grandmother   . Diabetes Maternal Grandfather   . Stroke Paternal Grandmother     Social History:  reports that she has been smoking Cigarettes.  She has a 10 pack-year smoking history. She has never used smokeless tobacco. She reports that she does not drink alcohol or use illicit drugs.  Additional Social History:  Alcohol / Drug Use History of alcohol / drug use?: Yes Substance #2 Name of Substance 2: Alcohol 2 - Age of First Use: teens 2 - Amount (size/oz): not on a regular basis 2 - Frequency: 1/2 or 1/5 of vodka  2 - Duration: once 2 - Last Use / Amount: last night  CIWA: CIWA-Ar BP: 150/89 mmHg Pulse Rate: 95 COWS:    Allergies:  Allergies  Allergen  Reactions  . Avelox [Moxifloxacin Hcl In Nacl] Hives  . Levofloxacin Hives  . Penicillins Hives  . Quinolones Hives    Home Medications:  (Not in a hospital admission)  OB/GYN Status:  Patient's last menstrual period was 03/30/2013.  General Assessment Data Location of Assessment: [facility] Is this a Tele or Face-to-Face Assessment?: Face-to-Face Is this an Initial Assessment or a Re-assessment for this encounter?: Initial Assessment Living Arrangements: Children Can pt return to current living arrangement?: Yes Admission Status: Voluntary Is patient capable of signing voluntary admission?: Yes Transfer from: Home Referral Source: Self/Family/Friend     [facility] Crisis Care Plan Living Arrangements: Children Name of Psychiatrist: none Name of Therapist: non  Education Status Is patient currently in school?: No Highest grade of school patient has completed: college  Risk to self Suicidal Ideation: No Suicidal Intent: No Is patient at risk for suicide?: No Suicidal Plan?: No Access to Means: No What has been your use of drugs/alcohol within the last 12 months?: alcohol, positive cocaine Previous Attempts/Gestures: No How many times?: 0 Other Self Harm Risks: yes Triggers for Past Attempts: Unpredictable Intentional Self Injurious Behavior: Cutting Comment - Self Injurious Behavior: numerous superficial cuts to bilateral forearms Family Suicide History: No Recent stressful life event(s): Other (Comment) (psychiatrist no longer accepts insurance, no medications) Persecutory voices/beliefs?: No Depression: Yes Depression Symptoms: Despondent;Tearfulness;Isolating;Guilt;Loss of interest in usual pleasures;Feeling worthless/self pity Substance abuse history and/or treatment for substance abuse?: Yes  Risk to Others Homicidal Ideation: No Thoughts of Harm to Others: No Current Homicidal Intent: No Current Homicidal Plan: No Access to Homicidal Means: No Identified Victim:  n/a History of harm to others?: No Assessment of Violence: None Noted Violent Behavior Description: none Does patient have access to weapons?: Yes (Comment) (knife) Criminal Charges Pending?: No Does patient have a court date: No  Psychosis Hallucinations: None noted Delusions: None noted  Mental Status Report Appear/Hygiene: Other (Comment) (dishelved) Eye Contact: Fair Motor Activity: Freedom of movement Speech: Logical/coherent Level of Consciousness: Alert;Restless Mood: Anxious Affect: Appropriate to circumstance Anxiety Level: Moderate Thought Processes: Coherent;Relevant Judgement: Unimpaired Orientation: Person;Place;Time;Situation Obsessive Compulsive Thoughts/Behaviors: None  Cognitive Functioning Concentration: Normal Memory: Recent Intact;Remote Intact IQ: Average Insight: Fair Impulse Control: Fair Appetite: Fair Sleep: Decreased Vegetative Symptoms: None  ADLScreening [facility] Assessment Services) Patient's cognitive ability adequate to safely complete daily activities?: Yes Patient able to express need for assistance with ADLs?: Yes Independently performs ADLs?: Yes (appropriate for developmental age)  Prior Inpatient Therapy Prior Inpatient Therapy: Yes Prior Therapy Facilty/Provider(s): [facility] Reason for Treatment: detox  Prior Outpatient Therapy Prior Outpatient Therapy: No Prior Therapy Dates: no longer Prior Therapy Facilty/Provider(s): Evelene Croon  Reason for Treatment:  anxiety and depression  ADL Screening (condition at time of admission) Patient's cognitive ability adequate to safely complete daily activities?: Yes Patient able to express need for assistance with ADLs?: Yes Independently performs ADLs?: Yes (appropriate for developmental age)         Values / Beliefs Cultural Requests During Hospitalization: None Spiritual Requests During Hospitalization: None        Additional Information 1:1 In Past 12 Months?: No CIRT Risk:  No Elopement Risk: No Does patient have medical clearance?: Yes     Disposition:  Disposition Initial Assessment Completed for this Encounter: Yes Disposition of Patient: Referred to  On Site Evaluation by:   Reviewed with Physician:    Catha Gosselin A 04/13/2013 2:05 PM

## 2013-04-13 NOTE — ED Notes (Signed)
Pt states she has been seeing someone for her anxiety disorder and the MD quit taking her insurance and was unable to refill her klonopin 1 month ago. Pt has had increased anxiety and began  Drinking vodka last night to try to help get her mind off of things. Pt then began to cut her arms for stress relief which she states she has never done in the past. Pt was seen last week at [facility] and dx with kidney infection and is currently on septra. Pt has labored breathing, pulse 107 complains of headache and tight chest dry cough. Pt is trembling.  Mother at bedside. Mother is supportive and very concerned with pt situation.

## 2013-04-13 NOTE — Telephone Encounter (Signed)
Left message for her to call me back. 

## 2013-04-13 NOTE — ED Notes (Signed)
Pt complains of cold symptoms the past 2 days

## 2013-04-13 NOTE — ED Notes (Signed)
Parents report psychiatrist Deas would like pt to go to [facility] and be sent to [facility] from there for long term inpatient tx.

## 2013-04-13 NOTE — Progress Notes (Addendum)
[facility] ED CM consulted by ED SW and TTS staff about medication assistance for [employer] pt needing d/c on seroquel xr 50 mg one qhs 30 days Cm spoke with Marcelino Duster at [employer] [phone] medication vendor [employer] to find out that the cost of this rx would be $108. 56 for this pt who is in the donut hole at this time (pt would have to pay 47.50 % of the original cost $279 of the medication.  TTS informed Pt also given a rx medication discount card and information for [employer] [id number] [place], [place], [place]  provides financial assistance of up to $50.00 to help pay for prescription medications. [phone] This pt has insurance coverage therefore is not a [facility] MATCH candidate   CM spoke with pt about contact to her [employer] pharmacy vendor and reviewed with pt how to complete pt assistance program for free [employer] medicines Provided a copy of the pt application from needymeds.org. Pt informed CM she was aware of the pt assistance program

## 2013-04-13 NOTE — Progress Notes (Addendum)
Pt listed without a pcp Cm spoke with her but she states Dr Yong Channel is her urgent care dr she would go to if needed.  [facility] updated   [facility] ED CM spoke with pt on how to obtain an in network pcp with insurance coverage via the customer service number or web site  Cm reviewed ED level of care for crisis/emergent services and community pcp level of care to manage continuous or chronic medical concerns.  The pt voiced understanding

## 2013-04-13 NOTE — ED Notes (Signed)
Mother reports pt has had multiple drug over doses in the past and they are currently looking into a [address] in patient treatment that was referred to her by her counselor. Mother states pt has had multiple rehab admissions  for narcotic addiction as well.

## 2013-04-13 NOTE — ED Notes (Signed)
Pt has been wanded 

## 2013-04-13 NOTE — ED Notes (Signed)
Bed: [facility]04 Expected date:  Expected time:  Means of arrival:  Comments: EMS - anxiety

## 2013-04-13 NOTE — ED Notes (Signed)
Pt complains of feeling hot. Cool cloth given to pt.

## 2013-04-13 NOTE — ED Notes (Addendum)
Pt with multiple requests for medications. Pt asking for Ambien and Ativan to both be added to her night medications. Pt states she takes Ambien at home and gets them from another pharmacy. Pt states she "Forgot to mention them to the doctor"; pt also did not mention these medications to the pharmacy tech when questioned. Pt irritable and states she doesn't understand why staff could be so callous about her medications. Pt with an order for Seroquel for sleep. This RN asked the [facility] NP, Cori who states the MD has given Seroquel for sleep tonight. No new orders obtained. Pt informed of decision.

## 2013-04-13 NOTE — ED Notes (Signed)
Pt stopped RN again to inquire about medications for alcohol withdrawal. RN informed pt that MD and NP both reviewed information and that based on CIWA score, no meds ordered at this time.

## 2013-04-13 NOTE — Consult Note (Signed)
Evaluation for inpatient treatment; Face to face interview and consulted with Dr. Lucianne Muss  Referred by:  EDP   HPI:  Patient presents to [facility] with complaints of worsening anxiety over the last 3-4 months.  Patient states that she is unable to function and care for her child.  Patient has multiple superficial cuts scabbed over located bilaterally upper ext. Inner forearms.  Patient states "I felt so bad last night and I just wanted to see if I could make my self feel different; I wasn't trying to kill my self or anything like that.  I just don't like feeling like this.  I was taking klonopin which is the only thing that has ever help.  I ran out about a month ago.  Dr.Kuar (outpatient psychiatrist) office does not take blue medicare no more so I wasn't able to go back."  Patient states that she has tried multiple medications such as Xanax, Ativan, Remeron, Valium, Buspar, and Amitriptyline and none has worked better than the [employer]."  Patient states that she has neve done street drugs but drinks alcohol. When asked about the testing positive for cocaine patient states "that's a lie; I have never done cocaine; the test is wrong." Patient denies suicidal ideation, homicidal ideation, psychosis, and paranoia.  Explained to patient that we would not be witting her a prescription for Klonopin; that is something that she would need to have done by her primary provider and we could set her up with another provider outpatient services.  Patient states "That really upsets me that you say that I won't get a prescription for Klonopin.  That is the only medicine that has helped my anxiety."  Explained to patient that Benzodiazepines were ment for short term use.  Patient seemed upset with the information, stating "well there is nothing else that helps my anxiety."    SW call the office of Dr. Jeraldine Loots and was informed that patient was discharged from services related to receiving narcotics from multiple providers.  Mother  of patient also stated to ED nurse that patient was drug seeking and left.    Discussed with patient speaking with Dr.Kuar office and patient states "That is a lie."   @ Axis I: Generalized Anxiety Disorder, Post Traumatic Stress Disorder and Polysubstance abuce Axis II: Deferred Axis III:  Past Medical History  Diagnosis Date  . Narcotic abuse in remission   . Thyroid disease   . Chronic back pain   . Hypothyroidism   . Anxiety   . GERD (gastroesophageal reflux disease)   . Sleep apnea     cpap  . Headache(784.0)   . Neuromuscular disorder     neuropathy  . Arthritis     knees bilateral   DJD  stenosis   . Blood dyscrasia     HSV  . Cancer     vaginal  malignant carcinoma  . PCOS (polycystic ovarian syndrome)    Axis IV: other psychosocial or environmental problems and problems related to social environment Axis V: 51-60 moderate symptoms  @Psychiatric  Specialty Exam: @PHYSEXAMBYAGE2 @  @ROS @  Blood pressure 150/89, pulse 95, temperature 98.8 F (37.1 C), temperature source Oral, resp. rate 25, last menstrual period 03/30/2013, SpO2 98.00%.There is no weight on file to calculate BMI.  General Appearance: Casual  Eye Contact::  Good  Speech:  Clear and Coherent and Normal Rate  Volume:  Normal  Mood:  Anxious  Affect:  Labile  Thought Process:  Circumstantial  Orientation:  Full (Time, Place, and Person)  Thought Content:  Rumination  Suicidal Thoughts:  No  Homicidal Thoughts:  No  Memory:  Immediate;   Good Recent;   Good Remote;   Good  Judgement:  Fair  Insight:  Lacking  Psychomotor Activity:  Normal  Concentration:  Fair  Recall:  Good  Akathisia:  No  Handed:  Right  AIMS (if indicated):     Assets:  Community education officer  Sleep:       Plan:   Patient does not meet criteria for inpatient treatment.  Discussed outpatient resources and options.    Discharge home with resource information for Intensive Outpatient.   Prescribe Seroquel XR 24 hr. 50 mg PO Q HS. (inform medication management will be through [facility] outpatient services who take the [facility] Card).  [facility] outpatient services will call to follow up with appointment for services.     04/13/2013  4:23 PM:  Patient states at this time she is not sure of what she will do when she gets home. Discussed with patient that an appointment was set with [facility] IOP 04/20/2013 at 10:00 AM.  Also informed patient that we checked with her insurance and Seroquel was covered and also gave patient resources that would assist with the Co-pay.  Patient continues to state that UDS is wrong and that Dr. Jeraldine Loots office is lying about why she was discharged from their services.  Patient continues to state that no other medication helps her other than the Klonopin.  Informed patient that she would be getting the Seroquel and would not be getting the Klonopin or any other benzodiazapine; patient upset about medication and states that she will just talk to the other doctor in the morning.  Patient states "I am anxious right now what are you going to give me."  Informed again that she would receive Seroquel tonight and she would be reassess in the morning by psychiatrist.  Also informed patient that she would need to keep the IOP appointment that was set for her.  Patient mother at bedside.    Disposition:  Keep over night assess for safety and stabilization.  Discharge in morning to follow up with [facility] IOP services.  Patient has prescription for Seroquel XR 50 mg and the information for IOP appointment at her bedside.    Shuvon B. Rankin FNP-BC Family Nurse Practitioner, Board Certified

## 2013-04-14 MED ORDER — GABAPENTIN 300 MG PO CAPS
300.0000 mg | ORAL_CAPSULE | Freq: Three times a day (TID) | ORAL | Status: DC
  Administered 2013-04-14 – 2013-04-15 (×3): 300 mg via ORAL
  Filled 2013-04-14 (×6): qty 1

## 2013-04-14 MED ORDER — DIPHENHYDRAMINE HCL 25 MG PO CAPS
25.0000 mg | ORAL_CAPSULE | Freq: Four times a day (QID) | ORAL | Status: DC | PRN
  Administered 2013-04-14 – 2013-04-15 (×2): 25 mg via ORAL
  Filled 2013-04-14 (×2): qty 1

## 2013-04-14 NOTE — Progress Notes (Addendum)
Pt accepted to [facility] by Dr. Ladona Ridgel pending 500 hall bed.   Frutoso Schatz [phone]  ED CSW 04/14/2013 1029am   Per further discussion with psychiatrist, [facility], and Psychiatric NP patient has gone back and forth on suidicidal, needing detox from alcohol, and having anxiety. Pt states that vistaril has helped some. When psychiatrist spoke with patient today, patient was told that she would not prescribe the benzodiazepines that she is requesting, and pt stated she wanted to go home. WHen CSW met with pt at bedside along with NP, patient stated that she needs to stay inpatient. Patient reports she feels she is going through withdrawls. Patient reports she wants help with her anxiety and is afraid she will go home and drink. Per further discussion with NP and psychiatrist, they have recommended further observation in the ED over night and to reassess in the morning. CSW informed patient. Patient requested psychiatrist to prescribe Remus Loffler since she will be here tonight.   Catha Gosselin, LCSW [phone]  ED CSW .04/14/2013 11:08

## 2013-04-14 NOTE — ED Notes (Signed)
Pt appearing very drowsy, but states she "Could not sleep all night". RN informed pt that she slept five hours. Pt requesting anxiety medication. No distress noted; med-seeking behaviors continue.

## 2013-04-14 NOTE — Consult Note (Signed)
Patient Identification:  [name] Date of Evaluation:  04/14/2013   History of Present Illness:  Patient was seen yesterday after superficially cutting her arm.  She states she cut her arm to relieve her stress and she was not trying to kill herself.  Patient also came for increased anxiety and stating that the only medication that helps her is Klonopin.  Patient was kept over night for reassessment by the Psychiatrist.  This am she was not able to contract for safety.  She is still begging for Klonopin stating  it is the only medication that helps her.  Patient did not look up to interact with this Clinical research associate or Dr Ladona Ridgel.   She is labile and and tearful at times.  We will keep patient overnight and reassess in am.  Past Psychiatric History:  Mood d/o, anxiety d/o   Past Medical History:     Past Medical History  Diagnosis Date  . Narcotic abuse in remission   . Thyroid disease   . Chronic back pain   . Hypothyroidism   . Anxiety   . GERD (gastroesophageal reflux disease)   . Sleep apnea     cpap  . Headache(784.0)   . Neuromuscular disorder     neuropathy  . Arthritis     knees bilateral   DJD  stenosis   . Blood dyscrasia     HSV  . Cancer     vaginal  malignant carcinoma  . PCOS (polycystic ovarian syndrome)        Past Surgical History  Procedure Laterality Date  . Back surgery    . Cesarean section    . Tonsillectomy    . Tubal ligation    . Fracture surgery      Allergies:  Allergies  Allergen Reactions  . Avelox [Moxifloxacin Hcl In Nacl] Hives  . Levofloxacin Hives  . Penicillins Hives  . Quinolones Hives    Current Medications:  Prior to Admission medications   Medication Sig Start Date End Date Taking? Authorizing Provider  ibuprofen (ADVIL,MOTRIN) 200 MG tablet Take 800 mg by mouth every 6 (six) hours as needed for pain.   Yes Historical Provider, MD  levothyroxine (SYNTHROID, LEVOTHROID) 50 MCG tablet Take 50 mcg by mouth daily before breakfast.    Yes Historical Provider, MD  ondansetron (ZOFRAN) 4 MG tablet Take 1 tablet (4 mg total) by mouth every 6 (six) hours. 04/10/13  Yes Trevor Mace, PA-C  PARoxetine (PAXIL) 40 MG tablet Take 1 tablet (40 mg total) by mouth every morning. 11/01/12  Yes Tonye Pearson, MD  pregabalin (LYRICA) 200 MG capsule Take 1 capsule (200 mg total) by mouth 3 (three) times daily. 03/09/13  Yes Tonye Pearson, MD  sulfamethoxazole-trimethoprim (SEPTRA DS) 800-160 MG per tablet Take 1 tablet by mouth 2 (two) times daily. 04/10/13  Yes Trevor Mace, PA-C  QUEtiapine (SEROQUEL XR) 50 MG TB24 24 hr tablet Take 1 tablet (50 mg total) by mouth at bedtime. 04/13/13   Shuvon Rankin, NP    Social History:    reports that she has been smoking Cigarettes.  She has a 10 pack-year smoking history. She has never used smokeless tobacco. She reports that she does not drink alcohol or use illicit drugs.   Family History:    Family History  Problem Relation Age of Onset  . Arthritis Mother   . Clotting disorder Mother   . Hyperlipidemia Father   . Leukemia Maternal Grandmother   . Diabetes  Maternal Grandfather   . Stroke Paternal Grandmother     Mental Status Examination/Evaluation:  Patient was seen awake, alert and oriented x 3, she was cooperative but anxious.  She reports her mood to be depressed and her affect is congruent.  She exhibited poor eye contact through out the interview.  Her concentration and attention is poor and she was wringing her hand through out the interview.  Her judgement and insight is poor.   DIAGNOSIS:   AXIS I   Generalized anxiety d/o, Depressive d/o  AXIS II  Deffered  AXIS III See medical notes.  AXIS IV other psychosocial or environmental problems, problems related to social environment and problems with primary support group  AXIS V 55-65     Assessment/Plan: Consult and face to face interview with Dr Ladona Ridgel Patient will be kept here in the ER for one more night  For  monitoring of her anxiety/ and alcohol withdrawal symptoms She has been informed she cannot have any Narcotics here We will reevaluate in am.

## 2013-04-14 NOTE — ED Notes (Addendum)
Patient focused on medications during assessment. Pt asking for Vistaril three hours early. Pt denies SI/HI. No s/s of distress noted.

## 2013-04-14 NOTE — ED Notes (Signed)
Pt is wake and alert, denies HI, AH ,VH or SI. Pt is agitated and anxious. Pt is requesting narcotics only. Pt denies pain, pt is mediation complaint . Will continue to monitor for safety

## 2013-04-15 MED ORDER — NICOTINE 21 MG/24HR TD PT24
21.0000 mg | MEDICATED_PATCH | Freq: Once | TRANSDERMAL | Status: DC
  Administered 2013-04-15: 21 mg via TRANSDERMAL

## 2013-04-15 NOTE — Consult Note (Signed)
Patient Identification:  [name] Date of Evaluation:  04/15/2013   History of Present Illness: Patient came to the ER on Thursday night after cutting her fore arms superficially to relieve her anxiety and stress.  Patient states she did not cut herself as a suicide gesture.  Patient also stated she is been drinking more alcohol than normal lately and has had issues with Benzodiazepine.   She was seeking narcotics and was not given any during her stay in the ER.  She does not have a psychiatrist because her former doctor dropped her because she was shopping for narcotics from one facility  to another.  She is sober from alcohol and is not having any withdrawal symptoms.  Her V/S remains stable.  Patient was calm and smiling this am.  We will discharge her home to follow up with [facility].  She denies SI/HI/AVH.  Past Psychiatric History:  Anxiety d/o, Depressive d/o   Past Medical History:     Past Medical History  Diagnosis Date  . Narcotic abuse in remission   . Thyroid disease   . Chronic back pain   . Hypothyroidism   . Anxiety   . GERD (gastroesophageal reflux disease)   . Sleep apnea     cpap  . Headache(784.0)   . Neuromuscular disorder     neuropathy  . Arthritis     knees bilateral   DJD  stenosis   . Blood dyscrasia     HSV  . Cancer     vaginal  malignant carcinoma  . PCOS (polycystic ovarian syndrome)        Past Surgical History  Procedure Laterality Date  . Back surgery    . Cesarean section    . Tonsillectomy    . Tubal ligation    . Fracture surgery      Allergies:  Allergies  Allergen Reactions  . Avelox [Moxifloxacin Hcl In Nacl] Hives  . Levofloxacin Hives  . Penicillins Hives  . Quinolones Hives    Current Medications:  Prior to Admission medications   Medication Sig Start Date End Date Taking? Authorizing Provider  ibuprofen (ADVIL,MOTRIN) 200 MG tablet Take 800 mg by mouth every 6 (six) hours as needed for pain.   Yes Historical Provider,  MD  levothyroxine (SYNTHROID, LEVOTHROID) 50 MCG tablet Take 50 mcg by mouth daily before breakfast.   Yes Historical Provider, MD  ondansetron (ZOFRAN) 4 MG tablet Take 1 tablet (4 mg total) by mouth every 6 (six) hours. 04/10/13  Yes Trevor Mace, PA-C  PARoxetine (PAXIL) 40 MG tablet Take 1 tablet (40 mg total) by mouth every morning. 11/01/12  Yes Tonye Pearson, MD  pregabalin (LYRICA) 200 MG capsule Take 1 capsule (200 mg total) by mouth 3 (three) times daily. 03/09/13  Yes Tonye Pearson, MD  sulfamethoxazole-trimethoprim (SEPTRA DS) 800-160 MG per tablet Take 1 tablet by mouth 2 (two) times daily. 04/10/13  Yes Trevor Mace, PA-C  QUEtiapine (SEROQUEL XR) 50 MG TB24 24 hr tablet Take 1 tablet (50 mg total) by mouth at bedtime. 04/13/13   Shuvon Rankin, NP    Social History:    reports that she has been smoking Cigarettes.  She has a 10 pack-year smoking history. She has never used smokeless tobacco. She reports that she does not drink alcohol or use illicit drugs.   Family History:    Family History  Problem Relation Age of Onset  . Arthritis Mother   . Clotting disorder Mother   .  Hyperlipidemia Father   . Leukemia Maternal Grandmother   . Diabetes Maternal Grandfather   . Stroke Paternal Grandmother     Mental Status Examination/Evaluation:  Psychiatric Specialty Exam: @PHYSEXAMBYAGE2 @  @ROS @  Blood pressure 137/89, pulse 87, temperature 98.4 F (36.9 C), temperature source Oral, resp. rate 17, last menstrual period 03/30/2013, SpO2 98.00%.There is no weight on file to calculate BMI.  General Appearance: Casual  Eye Contact::  Good  Speech:  Clear and Coherent and Normal Rate  Volume:  Normal  Mood:  Euthymic  Affect:  Appropriate and Congruent  Thought Process:  Coherent, Goal Directed and Intact  Orientation:  Full (Time, Place, and Person)  Thought Content:  NA  Suicidal Thoughts:  No  Homicidal Thoughts:  No  Memory:  Immediate;   Good Recent;    Good Remote;   Good  Judgement:  Fair  Insight:  Fair  Psychomotor Activity:  Normal  Concentration:  Good  Recall:  NA  Akathisia:  NA  Handed:  Right  AIMS (if indicated):     Assets:  Desire for Improvement  Sleep:          DIAGNOSIS:   AXIS I   Generalized anxiety d/o, depressive d/o  AXIS II  Deffered  AXIS III See medical notes.  AXIS IV other psychosocial or environmental problems and problems related to social environment  AXIS V 61-70 mild symptoms     Assessment/Plan:  Consult and face to face interview with Dr Gilmore Laroche Patient is safe and fit for d/c home to her parents  I agreed with the findings, treatment and disposition plan of this patient. Thresa Ross, MD

## 2013-04-15 NOTE — Discharge Instructions (Signed)
Polysubstance Abuse °When people abuse more than one drug or type of drug it is called polysubstance or polydrug abuse. For example, many smokers also drink alcohol. This is one form of polydrug abuse. Polydrug abuse also refers to the use of a drug to counteract an unpleasant effect produced by another drug. It may also be used to help with withdrawal from another drug. People who take stimulants may become agitated. Sometimes this agitation is countered with a tranquilizer. This helps protect against the unpleasant side effects. Polydrug abuse also refers to the use of different drugs at the same time.  °Anytime drug use is interfering with normal living activities, it has become abuse. This includes problems with family and friends. Psychological dependence has developed when your mind tells you that the drug is needed. This is usually followed by physical dependence which has developed when continuing increases of drug are required to get the same feeling or "high". This is known as addiction or chemical dependency. A person's risk is much higher if there is a history of chemical dependency in the family. °SIGNS OF CHEMICAL DEPENDENCY °· You have been told by friends or family that drugs have become a problem. °· You fight when using drugs. °· You are having blackouts (not remembering what you do while using). °· You feel sick from using drugs but continue using. °· You lie about use or amounts of drugs (chemicals) used. °· You need chemicals to get you going. °· You are suffering in work performance or in school because of drug use. °· You get sick from use of drugs but continue to use anyway. °· You need drugs to relate to people or feel comfortable in social situations. °· You use drugs to forget problems. °"Yes" answered to any of the above signs of chemical dependency indicates there are problems. The longer the use of drugs continues, the greater the problems will become. °If there is a family history of  drug or alcohol use, it is best not to experiment with these drugs. Continual use leads to tolerance. After tolerance develops more of the drug is needed to get the same feeling. This is followed by addiction. With addiction, drugs become the most important part of life. It becomes more important to take drugs than participate in the other usual activities of life. This includes relating to friends and family. Addiction is followed by dependency. Dependency is a condition where drugs are now needed not just to get high, but to feel normal. °Addiction cannot be cured but it can be stopped. This often requires outside help and the care of professionals. Treatment centers are listed in the yellow pages under: Cocaine, Narcotics, and Alcoholics Anonymous. Most hospitals and clinics can refer you to a specialized care center. Talk to your caregiver if you need help. °Document Released: 03/04/2005 Document Revised: 10/05/2011 Document Reviewed: 07/13/2005 °[employer]® Patient Information ©2014 [employer]. ° °

## 2013-04-15 NOTE — ED Notes (Signed)
Pt is ready for discharge. She asked for a note for her school for days that were missed. The note covered 04-13-13 to 0-20-14. She called her father and her will transport her home. He is expected to arrive at this facility at 1200 noon. She denied any si/hi/av.

## 2013-04-15 NOTE — ED Provider Notes (Signed)
Pt seen and given follow up instructions as well as prescriptions by psychiatry.  See their notes for further details . Pt is safe to be discharged after their assessment and recommendations.  [name]. Tenelle Andreason, MD 04/15/13 1005

## 2013-04-17 NOTE — ED Provider Notes (Signed)
Medical screening examination/treatment/procedure(s) were performed by non-physician practitioner and as supervising physician I was immediately available for consultation/collaboration.   Marrion Finan, MD 04/17/13 1507 

## 2013-04-18 NOTE — Telephone Encounter (Signed)
Patient has not responded to my phone calls. Left another message if she still needs me to call.

## 2013-04-20 ENCOUNTER — Encounter (HOSPITAL_COMMUNITY): Payer: Self-pay | Admitting: Emergency Medicine

## 2013-04-20 ENCOUNTER — Emergency Department (HOSPITAL_COMMUNITY)
Admission: EM | Admit: 2013-04-20 | Discharge: 2013-04-22 | Disposition: A | Discharge status: Home / Self Care | Payer: Medicare Other | Attending: Emergency Medicine | Admitting: Emergency Medicine

## 2013-04-20 DIAGNOSIS — F411 Generalized anxiety disorder: Secondary | ICD-10-CM | POA: Insufficient documentation

## 2013-04-20 DIAGNOSIS — F1010 Alcohol abuse, uncomplicated: Secondary | ICD-10-CM | POA: Insufficient documentation

## 2013-04-20 DIAGNOSIS — Z8739 Personal history of other diseases of the musculoskeletal system and connective tissue: Secondary | ICD-10-CM | POA: Insufficient documentation

## 2013-04-20 DIAGNOSIS — Z3202 Encounter for pregnancy test, result negative: Secondary | ICD-10-CM | POA: Insufficient documentation

## 2013-04-20 DIAGNOSIS — E039 Hypothyroidism, unspecified: Secondary | ICD-10-CM | POA: Insufficient documentation

## 2013-04-20 DIAGNOSIS — Z8719 Personal history of other diseases of the digestive system: Secondary | ICD-10-CM | POA: Insufficient documentation

## 2013-04-20 DIAGNOSIS — F419 Anxiety disorder, unspecified: Secondary | ICD-10-CM

## 2013-04-20 DIAGNOSIS — Z880 Allergy status to penicillin: Secondary | ICD-10-CM | POA: Insufficient documentation

## 2013-04-20 DIAGNOSIS — Z8544 Personal history of malignant neoplasm of other female genital organs: Secondary | ICD-10-CM | POA: Insufficient documentation

## 2013-04-20 DIAGNOSIS — F17200 Nicotine dependence, unspecified, uncomplicated: Secondary | ICD-10-CM | POA: Insufficient documentation

## 2013-04-20 DIAGNOSIS — F1110 Opioid abuse, uncomplicated: Secondary | ICD-10-CM | POA: Insufficient documentation

## 2013-04-20 DIAGNOSIS — F10129 Alcohol abuse with intoxication, unspecified: Secondary | ICD-10-CM

## 2013-04-20 DIAGNOSIS — Z8669 Personal history of other diseases of the nervous system and sense organs: Secondary | ICD-10-CM | POA: Insufficient documentation

## 2013-04-20 DIAGNOSIS — F1910 Other psychoactive substance abuse, uncomplicated: Secondary | ICD-10-CM

## 2013-04-20 DIAGNOSIS — Z8742 Personal history of other diseases of the female genital tract: Secondary | ICD-10-CM | POA: Insufficient documentation

## 2013-04-20 DIAGNOSIS — G8929 Other chronic pain: Secondary | ICD-10-CM | POA: Insufficient documentation

## 2013-04-20 DIAGNOSIS — Z79899 Other long term (current) drug therapy: Secondary | ICD-10-CM | POA: Insufficient documentation

## 2013-04-20 DIAGNOSIS — Z862 Personal history of diseases of the blood and blood-forming organs and certain disorders involving the immune mechanism: Secondary | ICD-10-CM | POA: Insufficient documentation

## 2013-04-20 NOTE — ED Notes (Signed)
Pt states she came in by EMS  Pt states she wants to check in to detox for alcohol  Pt states she drinks about a half of a fifth everyday for the past 6 months  Pt denies SI/HI

## 2013-04-20 NOTE — Consult Note (Signed)
Note reviewed and agreed with  

## 2013-04-20 NOTE — ED Notes (Signed)
Pt BIB EMS. Pt wants to go to detox. Pt just d/c'ed from [facility] yesterday for alcohol abuse. Pt told EMS that she wants to detox from alcohol and her last drink was today. Pt arrives with no acute distress.

## 2013-04-21 DIAGNOSIS — F1010 Alcohol abuse, uncomplicated: Secondary | ICD-10-CM

## 2013-04-21 DIAGNOSIS — F411 Generalized anxiety disorder: Secondary | ICD-10-CM

## 2013-04-21 LAB — COMPREHENSIVE METABOLIC PANEL
ALT: 57 U/L — ABNORMAL HIGH (ref 0–35)
AST: 41 U/L — ABNORMAL HIGH (ref 0–37)
Alkaline Phosphatase: 93 U/L (ref 39–117)
GFR calc Af Amer: 87 mL/min — ABNORMAL LOW (ref >90)
Glucose, Bld: 106 mg/dL — ABNORMAL HIGH (ref 70–99)
Potassium: 3.3 mEq/L — ABNORMAL LOW (ref 3.5–5.1)
Sodium: 143 mEq/L (ref 135–145)
Total Protein: 6.9 g/dL (ref 6.0–8.3)

## 2013-04-21 LAB — CBC
Hemoglobin: 15.3 g/dL — ABNORMAL HIGH (ref 12.0–15.0)
MCHC: 35.4 g/dL (ref 30.0–36.0)
Platelets: 297 10*3/uL (ref 150–400)
RBC: 4.57 MIL/uL (ref 3.87–5.11)

## 2013-04-21 LAB — RAPID URINE DRUG SCREEN, HOSP PERFORMED
Amphetamines: NOT DETECTED
Barbiturates: NOT DETECTED
Benzodiazepines: POSITIVE — AB
Cocaine: NOT DETECTED
Tetrahydrocannabinol: NOT DETECTED

## 2013-04-21 LAB — PREGNANCY, URINE: Preg Test, Ur: NEGATIVE

## 2013-04-21 LAB — ETHANOL: Alcohol, Ethyl (B): 118 mg/dL — ABNORMAL HIGH (ref 0–11)

## 2013-04-21 MED ORDER — ZOLPIDEM TARTRATE 5 MG PO TABS
5.0000 mg | ORAL_TABLET | Freq: Every evening | ORAL | Status: DC | PRN
  Administered 2013-04-21 (×2): 5 mg via ORAL
  Filled 2013-04-21 (×2): qty 1

## 2013-04-21 MED ORDER — POTASSIUM CHLORIDE CRYS ER 20 MEQ PO TBCR
40.0000 meq | EXTENDED_RELEASE_TABLET | Freq: Once | ORAL | Status: AC
  Administered 2013-04-21: 40 meq via ORAL
  Filled 2013-04-21: qty 2

## 2013-04-21 MED ORDER — PAROXETINE HCL 20 MG PO TABS
40.0000 mg | ORAL_TABLET | Freq: Every day | ORAL | Status: DC
  Administered 2013-04-21 – 2013-04-22 (×2): 40 mg via ORAL
  Filled 2013-04-21 (×2): qty 2

## 2013-04-21 MED ORDER — IBUPROFEN 200 MG PO TABS
600.0000 mg | ORAL_TABLET | Freq: Three times a day (TID) | ORAL | Status: DC | PRN
  Administered 2013-04-21 – 2013-04-22 (×3): 600 mg via ORAL
  Filled 2013-04-21 (×3): qty 3

## 2013-04-21 MED ORDER — LORAZEPAM 1 MG PO TABS
1.0000 mg | ORAL_TABLET | Freq: Three times a day (TID) | ORAL | Status: DC | PRN
  Administered 2013-04-21: 1 mg via ORAL
  Filled 2013-04-21 (×2): qty 1

## 2013-04-21 MED ORDER — POTASSIUM CHLORIDE CRYS ER 20 MEQ PO TBCR: 20.0000 meq | EXTENDED_RELEASE_TABLET | Freq: Once | ORAL | Status: DC

## 2013-04-21 MED ORDER — LEVOTHYROXINE SODIUM 50 MCG PO TABS
50.0000 ug | ORAL_TABLET | Freq: Every day | ORAL | Status: DC
  Administered 2013-04-21 – 2013-04-22 (×2): 50 ug via ORAL
  Filled 2013-04-21 (×3): qty 1

## 2013-04-21 MED ORDER — LOPERAMIDE HCL 2 MG PO CAPS
2.0000 mg | ORAL_CAPSULE | Freq: Once | ORAL | Status: AC
  Administered 2013-04-21: 2 mg via ORAL
  Filled 2013-04-21: qty 1

## 2013-04-21 MED ORDER — HYDROXYZINE HCL 25 MG PO TABS
50.0000 mg | ORAL_TABLET | Freq: Two times a day (BID) | ORAL | Status: DC | PRN
  Administered 2013-04-21 – 2013-04-22 (×3): 50 mg via ORAL
  Filled 2013-04-21 (×3): qty 2

## 2013-04-21 MED ORDER — PREGABALIN 50 MG PO CAPS
200.0000 mg | ORAL_CAPSULE | Freq: Three times a day (TID) | ORAL | Status: DC
  Administered 2013-04-21 – 2013-04-22 (×4): 200 mg via ORAL
  Filled 2013-04-21 (×4): qty 4

## 2013-04-21 MED ORDER — NICOTINE 21 MG/24HR TD PT24
21.0000 mg | MEDICATED_PATCH | Freq: Every day | TRANSDERMAL | Status: DC
  Administered 2013-04-21 – 2013-04-22 (×2): 21 mg via TRANSDERMAL
  Filled 2013-04-21 (×2): qty 1

## 2013-04-21 MED ORDER — ONDANSETRON HCL 4 MG PO TABS
4.0000 mg | ORAL_TABLET | Freq: Three times a day (TID) | ORAL | Status: DC | PRN
Start: 2013-04-21 — End: 2013-04-22
  Administered 2013-04-21 – 2013-04-22 (×5): 4 mg via ORAL
  Filled 2013-04-21 (×6): qty 1

## 2013-04-21 NOTE — Consult Note (Signed)
[facility] Face-to-Face Psychiatry Consult   Reason for Consult:  37 year old female presents to ED requesting alcohol detox.   Referring Physician: Izola Price [name] is an 37 y.o. female.  Assessment: AXIS I:  Alcohol Abuse and Anxiety Disorder NOS AXIS II:  Deferred AXIS III:   Past Medical History  Diagnosis Date  . Narcotic abuse in remission   . Thyroid disease   . Chronic back pain   . Hypothyroidism   . Anxiety   . GERD (gastroesophageal reflux disease)   . Sleep apnea     cpap  . Headache(784.0)   . Neuromuscular disorder     neuropathy  . Arthritis     knees bilateral   DJD  stenosis   . Blood dyscrasia     HSV  . Cancer     vaginal  malignant carcinoma  . PCOS (polycystic ovarian syndrome)    AXIS IV:  housing problems AXIS V:  61-70 mild symptoms  Plan:  recommend evaluation by Psychiatrist  Subjective:   [name] is a 37 y.o. female patient presented to ED requesting alcohol detox.  She was discharged on 04/15/13 from [facility] as she presented on 04/13/13 with cuts bilateral arms stating that she cut herself as a result of anxiety not a suicide attempt according to patient.  She reports that she has been in detox many times for Opiates but has been clean and celebrated 1 year sobriety was 04/12/13.  She continues, "I have been prescribed Opiates such as Dilaudid a few times since 04/12/2012".    She reports that she goes to NA and she has a sponsor, has supportive family as well as supportive NA family.  She continues, "I was evicted from my apartment, my parents are out of town, and I have no place to go".  She reports that she has been using alcohol to help relieve stress and anxiety, she has been using heavy for approximately 4 months.  She reports that her parents searched around for residential rehab facility for her to go for rehab and states that she has a bed at [facility] in [place] that will be available on 05/11/2013.  She denies SI, HI, or AVH.     Past  Psychiatric History: Past Medical History  Diagnosis Date  . Narcotic abuse in remission   . Thyroid disease   . Chronic back pain   . Hypothyroidism   . Anxiety   . GERD (gastroesophageal reflux disease)   . Sleep apnea     cpap  . Headache(784.0)   . Neuromuscular disorder     neuropathy  . Arthritis     knees bilateral   DJD  stenosis   . Blood dyscrasia     HSV  . Cancer     vaginal  malignant carcinoma  . PCOS (polycystic ovarian syndrome)     reports that she has been smoking Cigarettes.  She has a 10 pack-year smoking history. She has never used smokeless tobacco. She reports that  drinks alcohol. She reports that she does not use illicit drugs. Family History  Problem Relation Age of Onset  . Arthritis Mother   . Clotting disorder Mother   . Hyperlipidemia Father   . Leukemia Maternal Grandmother   . Diabetes Maternal Grandfather   . Stroke Paternal Grandmother            Allergies:   Allergies  Allergen Reactions  . Avelox [Moxifloxacin Hcl In Nacl] Hives  . Levofloxacin Hives  .  Penicillins Hives  . Quinolones Hives     Objective: Blood pressure 127/86, pulse 103, temperature 98.3 F (36.8 C), temperature source Oral, resp. rate 20, height 5\' 7"  (1.702 m), weight 131.543 kg (290 lb), last menstrual period 03/30/2013, SpO2 99.00%.Body mass index is 45.41 kg/(m^2). Results for orders placed during the hospital encounter of 04/20/13 (from the past 72 hour(s))  CBC     Status: Abnormal   Collection Time    04/21/13 12:08 AM      Result Value Range   WBC 12.3 (*) 4.0 - 10.5 K/uL   Comment: REPEATED TO VERIFY     WHITE COUNT CONFIRMED ON SMEAR   RBC 4.57  3.87 - 5.11 MIL/uL   Hemoglobin 15.3 (*) 12.0 - 15.0 g/dL   HCT 08.6  57.8 - 46.9 %   MCV 94.5  78.0 - 100.0 fL   MCH 33.5  26.0 - 34.0 pg   MCHC 35.4  30.0 - 36.0 g/dL   RDW 62.9  52.8 - 41.3 %   Platelets 297  150 - 400 K/uL  COMPREHENSIVE METABOLIC PANEL     Status: Abnormal   Collection Time     04/21/13 12:08 AM      Result Value Range   Sodium 143  135 - 145 mEq/L   Potassium 3.3 (*) 3.5 - 5.1 mEq/L   Chloride 105  96 - 112 mEq/L   CO2 23  19 - 32 mEq/L   Glucose, Bld 106 (*) 70 - 99 mg/dL   BUN 9  6 - 23 mg/dL   Creatinine, Ser 2.44  0.50 - 1.10 mg/dL   Calcium 8.4  8.4 - 01.0 mg/dL   Total Protein 6.9  6.0 - 8.3 g/dL   Albumin 3.7  3.5 - 5.2 g/dL   AST 41 (*) 0 - 37 U/L   ALT 57 (*) 0 - 35 U/L   Alkaline Phosphatase 93  39 - 117 U/L   Total Bilirubin 0.3  0.3 - 1.2 mg/dL   GFR calc non Af Amer 75 (*) >90 mL/min   GFR calc Af Amer 87 (*) >90 mL/min   Comment: (NOTE)     The eGFR has been calculated using the CKD EPI equation.     This calculation has not been validated in all clinical situations.     eGFR's persistently <90 mL/min signify possible Chronic Kidney     Disease.  ETHANOL     Status: Abnormal   Collection Time    04/21/13 12:08 AM      Result Value Range   Alcohol, Ethyl (B) 334 (*) 0 - 11 mg/dL   Comment:            LOWEST DETECTABLE LIMIT FOR     SERUM ALCOHOL IS 11 mg/dL     FOR MEDICAL PURPOSES ONLY  URINE RAPID DRUG SCREEN (HOSP PERFORMED)     Status: Abnormal   Collection Time    04/21/13  1:09 AM      Result Value Range   Opiates NONE DETECTED  NONE DETECTED   Cocaine NONE DETECTED  NONE DETECTED   Benzodiazepines POSITIVE (*) NONE DETECTED   Amphetamines NONE DETECTED  NONE DETECTED   Tetrahydrocannabinol NONE DETECTED  NONE DETECTED   Barbiturates NONE DETECTED  NONE DETECTED   Comment:            DRUG SCREEN FOR MEDICAL PURPOSES     ONLY.  IF CONFIRMATION IS NEEDED     FOR  ANY PURPOSE, NOTIFY LAB     WITHIN 5 DAYS.                LOWEST DETECTABLE LIMITS     FOR URINE DRUG SCREEN     Drug Class       Cutoff (ng/mL)     Amphetamine      1000     Barbiturate      200     Benzodiazepine   200     Tricyclics       300     Opiates          300     Cocaine          300     THC              50  PREGNANCY, URINE     Status: None    Collection Time    04/21/13  1:15 AM      Result Value Range   Preg Test, Ur NEGATIVE  NEGATIVE   Comment:            THE SENSITIVITY OF THIS     METHODOLOGY IS >20 mIU/mL.   Labs reviewed- BAL 334.  Current Facility-Administered Medications  Medication Dose Route Frequency Provider Last Rate Last Dose  . ibuprofen (ADVIL,MOTRIN) tablet 600 mg  600 mg Oral Q8H PRN Arman Filter, NP      . levothyroxine (SYNTHROID, LEVOTHROID) tablet 50 mcg  50 mcg Oral QAC breakfast Arman Filter, NP      . LORazepam (ATIVAN) tablet 1 mg  1 mg Oral Q8H PRN Arman Filter, NP   1 mg at 04/21/13 0120  . nicotine (NICODERM CQ - dosed in mg/24 hours) patch 21 mg  21 mg Transdermal Daily Arman Filter, NP      . ondansetron [facility]) tablet 4 mg  4 mg Oral Q8H PRN Arman Filter, NP   4 mg at 04/21/13 0120  . PARoxetine (PAXIL) tablet 40 mg  40 mg Oral Daily Arman Filter, NP      . pregabalin (LYRICA) capsule 200 mg  200 mg Oral TID Arman Filter, NP      . zolpidem (AMBIEN) tablet 5 mg  5 mg Oral QHS PRN Arman Filter, NP   5 mg at 04/21/13 0156   Current Outpatient Prescriptions  Medication Sig Dispense Refill  . levothyroxine (SYNTHROID, LEVOTHROID) 50 MCG tablet Take 50 mcg by mouth daily before breakfast.      . ondansetron (ZOFRAN) 8 MG tablet Take 8 mg by mouth every 8 (eight) hours as needed for nausea.      Marland Kitchen PARoxetine (PAXIL) 40 MG tablet Take 1 tablet (40 mg total) by mouth every morning.  90 tablet  3  . pregabalin (LYRICA) 200 MG capsule Take 1 capsule (200 mg total) by mouth 3 (three) times daily.  270 capsule  3  . sulfamethoxazole-trimethoprim (SEPTRA DS) 800-160 MG per tablet Take 1 tablet by mouth 2 (two) times daily.  14 tablet  0  . zolpidem (AMBIEN) 10 MG tablet Take 10 mg by mouth at bedtime as needed for sleep.      Marland Kitchen ibuprofen (ADVIL,MOTRIN) 200 MG tablet Take 800 mg by mouth every 6 (six) hours as needed for pain.        Psychiatric Specialty Exam:     Blood pressure 127/86,  pulse 103, temperature 98.3 F (36.8 C), temperature source Oral, resp.  rate 20, height 5\' 7"  (1.702 m), weight 131.543 kg (290 lb), last menstrual period 03/30/2013, SpO2 99.00%.Body mass index is 45.41 kg/(m^2).  General Appearance: poorly groomed  Patent attorney::  Fair  Speech:  Normal Rate  Volume:  Normal  Mood:  Anxious  Affect:  Blunt  Thought Process:  Circumstantial  Orientation:  Full (Time, Place, and Person)  Thought Content:  WDL  Suicidal Thoughts:  No  Homicidal Thoughts:  No  Memory:  Immediate;   Fair  Judgement:  Impaired  Insight:  Lacking  Psychomotor Activity:  Negative  Concentration:  Fair  Recall:  Fair  Akathisia:  Negative  Handed:    AIMS (if indicated):     Assets:  Communication Skills Social Support  Sleep:      Treatment Plan Summary: Recommend Psychiatric evaluation once BAL stable Consider alternate facility  Kizzie Fantasia CORI 04/21/2013 2:01 AM  Reviewed the information documented and agree with the treatment plan.  Carreen Milius,JANARDHAHA R. 04/21/2013 10:36 PM

## 2013-04-21 NOTE — ED Provider Notes (Signed)
Medical screening examination/treatment/procedure(s) were performed by non-physician practitioner and as supervising physician I was immediately available for consultation/collaboration.   Shakeita Vandevander R Rosemaria Inabinet, MD 04/21/13 0123 

## 2013-04-21 NOTE — ED Notes (Signed)
Pt transferred from triage, presents for Alcohol detox, pt admits to drinking 1/2 fifth per [place] for past 6 mos. Denies SI or HI.  Pt reports she hasn't followed up with Dr Evelene Croon in mos for Anxiety & PTSD.  Complaint of nausea & anxiety at present.  Pt cooperative but anxious.

## 2013-04-21 NOTE — ED Notes (Signed)
FNP at bedside.  Warm blankets given

## 2013-04-21 NOTE — Consult Note (Signed)
Patient Identification:  [name] Date of Evaluation:  04/21/2013   History of Present Illness:  Patient came in intoxicated seeking detox treatment.  Patient has been going from one ER to another seeking prescription for Benzodiazepine but was unable to get one.  She then drank some alcohol last night and came back seeking detox treatment.  Patient states she is homeless and it is not safe for her to go home.  Patient is taken off all Benzodiazepine and we will monitor her overnight and plan to discharge her tomorrow.  Patient denies SI/HI/AVH but is asking for a 5 days stay here since she does not have a place to stay.  We will continue to monitor patient.  Past Psychiatric History: Polysubstance dependence, Anxiety d/o   Past Medical History:     Past Medical History  Diagnosis Date  . Narcotic abuse in remission   . Thyroid disease   . Chronic back pain   . Hypothyroidism   . Anxiety   . GERD (gastroesophageal reflux disease)   . Sleep apnea     cpap  . Headache(784.0)   . Neuromuscular disorder     neuropathy  . Arthritis     knees bilateral   DJD  stenosis   . Blood dyscrasia     HSV  . Cancer     vaginal  malignant carcinoma  . PCOS (polycystic ovarian syndrome)        Past Surgical History  Procedure Laterality Date  . Back surgery    . Cesarean section    . Tonsillectomy    . Tubal ligation    . Fracture surgery      Allergies:  Allergies  Allergen Reactions  . Avelox [Moxifloxacin Hcl In Nacl] Hives  . Levofloxacin Hives  . Penicillins Hives  . Quinolones Hives    Current Medications:  Prior to Admission medications   Medication Sig Start Date End Date Taking? Authorizing Provider  levothyroxine (SYNTHROID, LEVOTHROID) 50 MCG tablet Take 50 mcg by mouth daily before breakfast.   Yes Historical Provider, MD  ondansetron (ZOFRAN) 8 MG tablet Take 8 mg by mouth every 8 (eight) hours as needed for nausea.   Yes Historical Provider, MD  PARoxetine  (PAXIL) 40 MG tablet Take 1 tablet (40 mg total) by mouth every morning. 11/01/12  Yes Tonye Pearson, MD  pregabalin (LYRICA) 200 MG capsule Take 1 capsule (200 mg total) by mouth 3 (three) times daily. 03/09/13  Yes Tonye Pearson, MD  sulfamethoxazole-trimethoprim (SEPTRA DS) 800-160 MG per tablet Take 1 tablet by mouth 2 (two) times daily. 04/10/13  Yes Trevor Mace, PA-C  zolpidem (AMBIEN) 10 MG tablet Take 10 mg by mouth at bedtime as needed for sleep.   Yes Historical Provider, MD  ibuprofen (ADVIL,MOTRIN) 200 MG tablet Take 800 mg by mouth every 6 (six) hours as needed for pain.    Historical Provider, MD    Social History:    reports that she has been smoking Cigarettes.  She has a 10 pack-year smoking history. She has never used smokeless tobacco. She reports that  drinks alcohol. She reports that she does not use illicit drugs.   Family History:    Family History  Problem Relation Age of Onset  . Arthritis Mother   . Clotting disorder Mother   . Hyperlipidemia Father   . Leukemia Maternal Grandmother   . Diabetes Maternal Grandfather   . Stroke Paternal Grandmother     Mental Status Examination/Evaluation:Psychiatric  Specialty Exam: @PHYSEXAMBYAGE2 @  @ROS @  Blood pressure 146/90, pulse 118, temperature 97.8 F (36.6 C), temperature source Oral, resp. rate 18, height 5\' 7"  (1.702 m), weight 131.543 kg (290 lb), last menstrual period 03/30/2013, SpO2 97.00%.Body mass index is 45.41 kg/(m^2).  General Appearance: Disheveled  Eye Contact::  Good  Speech:  Clear and Coherent  Volume:  Normal  Mood:  Angry, Anxious and Irritable  Affect:  Congruent  Thought Process:  NA  Orientation:  Full (Time, Place, and Person)  Thought Content:  NA  Suicidal Thoughts:  No  Homicidal Thoughts:  No  Memory:  Immediate;   Good Recent;   Good Remote;   Good  Judgement:  Poor  Insight:  Lacking and Shallow  Psychomotor Activity:  Normal  Concentration:  Poor  Recall:  NA   Akathisia:  NA  Handed:  Right  AIMS (if indicated):     Assets:  Desire for Improvement Housing  Sleep:          DIAGNOSIS:   AXIS I   Anxiety d/o, Alcohol abuse  AXIS II  Deffered  AXIS III See medical notes.  AXIS IV housing problems, occupational problems, other psychosocial or environmental problems, problems related to social environment and problems with primary support group  AXIS V 51-60 moderate symptoms     Assessment/Plan:  Consult and face to face interview with Dr Ladona Ridgel We will stop all Benzodiazepine use on patient We will continue to monitor her for withdrawal symptoms We will offer Vistaril bid for her anxiety.

## 2013-04-21 NOTE — ED Provider Notes (Signed)
Medical screening examination/treatment/procedure(s) were conducted as a shared visit with non-physician practitioner(s) and myself.  I personally evaluated the patient during the encounter  Patient states she now has vomiting diarrhea shakes and anxiety and wants detox, denies suicidal or homicidal ideation, apparently discharged yesterday from [facility] for alcohol abuse, TTS recommendations pending. 1055  [facility] recs discharge, Pt refuses discharge, [facility] states Pt can stay in Psych ED overnight but no narcotics or benzos due to Pt's substance abuse. 1300  Pt witnessed walking and talking normally multiple times in Psych ED without tremors.  Hurman Horn, MD 04/22/13 [phone]

## 2013-04-21 NOTE — ED Notes (Signed)
PT CHANGED IN TO [facility]. PT AND BELONGINGS SEARCHED AND WANDED BY SECURITY

## 2013-04-21 NOTE — ED Provider Notes (Addendum)
CSN: [id number]     Arrival date & time 04/20/13  2318 History   First MD Initiated Contact with Patient 04/21/13 0015     Chief Complaint  Patient presents with  . Medical Clearance   (Consider location/radiation/quality/duration/timing/severity/associated sxs/prior Treatment) HPI Comments: Patient is requesting detox from alcohol.  States she has been drinking for about 6 months.  She is a recovering narcotic abuse.  She states, that she has been in contact with a residential detox.  Program that will have a bed for her on the 16th, that she needs to be sober.  Hours.  She was just discharged from [facility] yesterday, as she was treated for alcohol abuse.  Denies suicidal or homicidal.  The history is provided by the patient.    Past Medical History  Diagnosis Date  . Narcotic abuse in remission   . Thyroid disease   . Chronic back pain   . Hypothyroidism   . Anxiety   . GERD (gastroesophageal reflux disease)   . Sleep apnea     cpap  . Headache(784.0)   . Neuromuscular disorder     neuropathy  . Arthritis     knees bilateral   DJD  stenosis   . Blood dyscrasia     HSV  . Cancer     vaginal  malignant carcinoma  . PCOS (polycystic ovarian syndrome)    Past Surgical History  Procedure Laterality Date  . Back surgery    . Cesarean section    . Tonsillectomy    . Tubal ligation    . Fracture surgery     Family History  Problem Relation Age of Onset  . Arthritis Mother   . Clotting disorder Mother   . Hyperlipidemia Father   . Leukemia Maternal Grandmother   . Diabetes Maternal Grandfather   . Stroke Paternal Grandmother    History  Substance Use Topics  . Smoking status: Current Every [place] Smoker -- 0.50 packs/[place] for 20 years    Types: Cigarettes  . Smokeless tobacco: Never Used  . Alcohol Use: Yes     Comment: heavy   OB History   Grav Para Term Preterm Abortions TAB SAB Ect Mult Living                 Review of Systems  Constitutional:  Negative for fever.  Gastrointestinal: Negative for abdominal pain.  Skin: Negative for rash and wound.  Neurological: Negative for dizziness and headaches.  Psychiatric/Behavioral: Negative for suicidal ideas and sleep disturbance.  All other systems reviewed and are negative.    Allergies  Avelox; Levofloxacin; Penicillins; and Quinolones  Home Medications   Current Outpatient Rx  Name  Route  Sig  Dispense  Refill  . levothyroxine (SYNTHROID, LEVOTHROID) 50 MCG tablet   Oral   Take 50 mcg by mouth daily before breakfast.         . ondansetron (ZOFRAN) 8 MG tablet   Oral   Take 8 mg by mouth every 8 (eight) hours as needed for nausea.         Marland Kitchen PARoxetine (PAXIL) 40 MG tablet   Oral   Take 1 tablet (40 mg total) by mouth every morning.   90 tablet   3   . pregabalin (LYRICA) 200 MG capsule   Oral   Take 1 capsule (200 mg total) by mouth 3 (three) times daily.   270 capsule   3   . sulfamethoxazole-trimethoprim (SEPTRA DS) 800-160 MG per tablet  Oral   Take 1 tablet by mouth 2 (two) times daily.   14 tablet   0   . zolpidem (AMBIEN) 10 MG tablet   Oral   Take 10 mg by mouth at bedtime as needed for sleep.         Marland Kitchen ibuprofen (ADVIL,MOTRIN) 200 MG tablet   Oral   Take 800 mg by mouth every 6 (six) hours as needed for pain.          BP 146/90  Pulse 118  Temp(Src) 97.8 F (36.6 C) (Oral)  Resp 18  Ht 5\' 7"  (1.702 m)  Wt 290 lb (131.543 kg)  BMI 45.41 kg/m2  SpO2 97%  LMP 03/30/2013 Physical Exam  Nursing note and vitals reviewed. Constitutional: She is oriented to person, place, and time. She appears well-developed and well-nourished.  HENT:  Head: Normocephalic.  Eyes: Pupils are equal, round, and reactive to light.  Neck: Normal range of motion.  Cardiovascular: Normal rate.   Pulmonary/Chest: Effort normal.  Musculoskeletal: Normal range of motion.  Neurological: She is alert and oriented to person, place, and time.  Skin: Skin is  warm.    ED Course  Procedures (including critical care time) Labs Review Labs Reviewed  CBC - Abnormal; Notable for the following:    WBC 12.3 (*)    Hemoglobin 15.3 (*)    All other components within normal limits  COMPREHENSIVE METABOLIC PANEL - Abnormal; Notable for the following:    Potassium 3.3 (*)    Glucose, Bld 106 (*)    AST 41 (*)    ALT 57 (*)    GFR calc non Af Amer 75 (*)    GFR calc Af Amer 87 (*)    All other components within normal limits  ETHANOL - Abnormal; Notable for the following:    Alcohol, Ethyl (B) 334 (*)    All other components within normal limits  URINE RAPID DRUG SCREEN (HOSP PERFORMED) - Abnormal; Notable for the following:    Benzodiazepines POSITIVE (*)    All other components within normal limits  ETHANOL - Abnormal; Notable for the following:    Alcohol, Ethyl (B) 118 (*)    All other components within normal limits  PREGNANCY, URINE   Imaging Review No results found.  MDM   1. Alcohol abuse   2. Anxiety     Will obtain medical clearance.  Labs, that the patient.  Speak with TTS Potassium is 3.3.  This has been supplemented with 20 mEq by mouth   Arman Filter, NP 04/21/13 0052  Arman Filter, NP 04/21/13 2049

## 2013-04-22 ENCOUNTER — Encounter (HOSPITAL_COMMUNITY): Payer: Self-pay | Admitting: Registered Nurse

## 2013-04-22 MED ORDER — HYDROXYZINE HCL 50 MG PO TABS: 50.0000 mg | ORAL_TABLET | Freq: Two times a day (BID) | ORAL | Status: DC | PRN

## 2013-04-22 MED ORDER — ALUM & MAG HYDROXIDE-SIMETH 200-200-20 MG/5ML PO SUSP
30.0000 mL | Freq: Once | ORAL | Status: AC
  Administered 2013-04-22: 30 mL via ORAL
  Filled 2013-04-22: qty 30

## 2013-04-22 MED ORDER — LOPERAMIDE HCL 2 MG PO CAPS
2.0000 mg | ORAL_CAPSULE | Freq: Once | ORAL | Status: AC
  Administered 2013-04-22: 2 mg via ORAL
  Filled 2013-04-22: qty 1

## 2013-04-22 NOTE — ED Notes (Signed)
ACT into see 

## 2013-04-22 NOTE — ED Provider Notes (Signed)
Psych team, Rankin NP/Arfeen MD have reassessed pt today, and indicate pt ready for d/c.  Pt has plan for outpatient rehab follow up, and is agreeable w that plan. Pt alert, content, no tremor or shakes, no diaphoresis. No hx complicated etoh withdrawal or dts.  Hx substance abuse, opiate, bzd abuse, therefore psych team is avoiding rx w narcotic and/or bzd.  They have given pt rx vistaril for symptom relief.   Pt appears stable for d/c.   Suzi Roots, MD 04/22/13 1319

## 2013-04-22 NOTE — Consult Note (Signed)
Patient Identification:  [name] Date of Evaluation:  04/22/2013   History of Present Illness:  To [place] patient states that she is feeling better.  Patient states that she will be able to go to [facility] "this coming Friday. I will be in there for at least 30 days for rehab"  Patient states that she has spoke to her parents and will be able to stay with them until she leaves for [facility].  Patient denies suicidal ideation, homicidal ideation, psychosis, and paranoia.  Patient states that the Vistaril has help some with her anxiety "Is there anyway the dosage can be increased.  Informed patient that she was taking 50 mg twice daily and any increase should be done by her primary that is managing her medications.  Patient states that she is ready to go home.  Will give patient 1 week prescriptions for vistaril. Patient states that she feels safe that she will not drink any alcohol until she is admitted to [facility]  Patient will follow up with primary.    Past Psychiatric History: Polysubstance dependence, Anxiety d/o   Past Medical History:     Past Medical History  Diagnosis Date  . Narcotic abuse in remission   . Thyroid disease   . Chronic back pain   . Hypothyroidism   . Anxiety   . GERD (gastroesophageal reflux disease)   . Sleep apnea     cpap  . Headache(784.0)   . Neuromuscular disorder     neuropathy  . Arthritis     knees bilateral   DJD  stenosis   . Blood dyscrasia     HSV  . Cancer     vaginal  malignant carcinoma  . PCOS (polycystic ovarian syndrome)        Past Surgical History  Procedure Laterality Date  . Back surgery    . Cesarean section    . Tonsillectomy    . Tubal ligation    . Fracture surgery      Allergies:  Allergies  Allergen Reactions  . Avelox [Moxifloxacin Hcl In Nacl] Hives  . Levofloxacin Hives  . Penicillins Hives  . Quinolones Hives    Current Medications:  Prior to Admission medications   Medication Sig Start Date  End Date Taking? Authorizing Provider  levothyroxine (SYNTHROID, LEVOTHROID) 50 MCG tablet Take 50 mcg by mouth daily before breakfast.   Yes Historical Provider, MD  ondansetron (ZOFRAN) 8 MG tablet Take 8 mg by mouth every 8 (eight) hours as needed for nausea.   Yes Historical Provider, MD  PARoxetine (PAXIL) 40 MG tablet Take 1 tablet (40 mg total) by mouth every morning. 11/01/12  Yes Tonye Pearson, MD  pregabalin (LYRICA) 200 MG capsule Take 1 capsule (200 mg total) by mouth 3 (three) times daily. 03/09/13  Yes Tonye Pearson, MD  sulfamethoxazole-trimethoprim (SEPTRA DS) 800-160 MG per tablet Take 1 tablet by mouth 2 (two) times daily. 04/10/13  Yes Trevor Mace, PA-C  zolpidem (AMBIEN) 10 MG tablet Take 10 mg by mouth at bedtime as needed for sleep.   Yes Historical Provider, MD  ibuprofen (ADVIL,MOTRIN) 200 MG tablet Take 800 mg by mouth every 6 (six) hours as needed for pain.    Historical Provider, MD    Social History:    reports that she has been smoking Cigarettes.  She has a 10 pack-year smoking history. She has never used smokeless tobacco. She reports that  drinks alcohol. She reports that she  does not use illicit drugs.   Family History:    Family History  Problem Relation Age of Onset  . Arthritis Mother   . Clotting disorder Mother   . Hyperlipidemia Father   . Leukemia Maternal Grandmother   . Diabetes Maternal Grandfather   . Stroke Paternal Grandmother     Mental Status Examination/Evaluation:Psychiatric Specialty Exam: @PHYSEXAMBYAGE2 @  @ROS @  Blood pressure 137/85, pulse 96, temperature 98 F (36.7 C), temperature source Oral, resp. rate 18, height 5\' 7"  (1.702 m), weight 131.543 kg (290 lb), last menstrual period 03/30/2013, SpO2 97.00%.Body mass index is 45.41 kg/(m^2).  General Appearance: Disheveled  Eye Contact::  Good  Speech:  Clear and Coherent  Volume:  Normal  Mood:  Angry, Anxious and Irritable  Affect:  Congruent  Thought Process:  NA   Orientation:  Full (Time, Place, and Person)  Thought Content:  NA  Suicidal Thoughts:  No  Homicidal Thoughts:  No  Memory:  Immediate;   Good Recent;   Good Remote;   Good  Judgement:  Poor  Insight:  Lacking and Shallow  Psychomotor Activity:  Normal  Concentration:  Poor  Recall:  NA  Akathisia:  NA  Handed:  Right  AIMS (if indicated):     Assets:  Desire for Improvement Housing  Sleep:          DIAGNOSIS:   AXIS I   Anxiety d/o, Alcohol abuse  AXIS II  Deferred  AXIS III See medical notes.  AXIS IV housing problems, occupational problems, other psychosocial or environmental problems, problems related to social environment and problems with primary support group  AXIS V 51-60 moderate symptoms   Face to face interview and consult with Dr. Hulen Skains  Assessment/Plan:    Disposition:  Discharge home.  Patient to follow up with primary for medication management and refills.  Patient also to follow up with [facility] 04/28/2013 for inpatient rehab.    I have personally seen the patient and agreed with the findings and involved in the treatment plan. Kathryne Sharper, MD

## 2013-04-22 NOTE — ED Notes (Signed)
Sitting quietly, watching tv, waiting for dc.  Pt is going to stay with her parents until she goes to treatment.

## 2013-04-22 NOTE — ED Notes (Signed)
Written dc instructions reviewed w /pt, Rx given.  Pt encouraged to follow up for alcohol treatment as she has planned and return/seek treatment if she feels that she  is in danger of hurting herself or others.  Pt verbalized understanding.  Belongings returned after leaving the unit.

## 2013-04-22 NOTE — ED Notes (Signed)
Up to the bathroom 

## 2013-04-22 NOTE — ED Notes (Signed)
Dr Lolly Mustache and Denice Bors NP into see

## 2013-04-22 NOTE — ED Notes (Signed)
Pt up to the phone and reports that her ride will be here around 12:30p

## 2013-04-22 NOTE — ED Notes (Signed)
Pt up to the bathroom reports she has diarrhea and is requesting imodium, is also requesting zofran for nausea

## 2013-04-22 NOTE — ED Notes (Signed)
Up to the desk on the phone 

## 2013-04-24 NOTE — ED Provider Notes (Signed)
Medical screening examination/treatment/procedure(s) were performed by non-physician practitioner and as supervising physician I was immediately available for consultation/collaboration.    Roxana Lai R Amedio Bowlby, MD 04/24/13 1018 

## 2013-04-25 NOTE — Consult Note (Signed)
Note reviewed and agreed with  

## 2013-06-05 ENCOUNTER — Ambulatory Visit (INDEPENDENT_AMBULATORY_CARE_PROVIDER_SITE_OTHER): Payer: Medicare Other | Admitting: Family Medicine

## 2013-06-05 VITALS — BP 116/78 | HR 98 | Temp 98.4°F | Resp 18 | Wt 304.0 lb

## 2013-06-05 DIAGNOSIS — Z7185 Encounter for immunization safety counseling: Secondary | ICD-10-CM

## 2013-06-05 DIAGNOSIS — G4733 Obstructive sleep apnea (adult) (pediatric): Secondary | ICD-10-CM

## 2013-06-05 DIAGNOSIS — Z23 Encounter for immunization: Secondary | ICD-10-CM

## 2013-06-05 DIAGNOSIS — N970 Female infertility associated with anovulation: Secondary | ICD-10-CM

## 2013-06-05 DIAGNOSIS — L0201 Cutaneous abscess of face: Secondary | ICD-10-CM

## 2013-06-05 DIAGNOSIS — Z7189 Other specified counseling: Secondary | ICD-10-CM

## 2013-06-05 MED ORDER — DOXYCYCLINE HYCLATE 100 MG PO TABS: 100.0000 mg | ORAL_TABLET | Freq: Two times a day (BID) | ORAL | Status: DC

## 2013-06-05 MED ORDER — INFLUENZA VAC SPLIT QUAD 0.5 ML IM SUSP: 0.5000 mL | INTRAMUSCULAR | Status: DC

## 2013-06-05 NOTE — Progress Notes (Signed)
37 year old woman who normally sees Dr. Merla Riches. She's in a halfway house called [facility].  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 She comes in today with 24 hours of increasing swelling on her left cheek with central early pustule. Patient also needs sleep apnea study because her CPAP machine no longer works and she needs to have update of her settings. Objective: 3 mm papule left cheek just below the lower lid on the left. There is some surrounding erythema for perhaps 5 mm. There is no swelling of the lower lid and no drainage.  Assessment: Persistent infection of left cheek, need for flu vaccine  OSA on CPAP - Plan: Ambulatory referral to Sleep Studies  Cellulitis and abscess of face - Plan: doxycycline (VIBRA-TABS) 100 MG tablet  Immunization counseling - Plan: influenza vac split quadrivalent PF (FLUARIX) injection 0.5 mL  Signed, Elvina Sidle, MD

## 2013-06-12 ENCOUNTER — Institutional Professional Consult (permissible substitution): Payer: Medicare Other | Admitting: Neurology

## 2013-06-15 IMAGING — CR DG ANKLE COMPLETE 3+V*L*
3 series · 3 of 3 positions shown · non-contrast
Comparison: 05/27/2012.

CLINICAL DATA: Ankle pain.

LEFT ANKLE COMPLETE - 3+ VIEW

[x ankle ap left]
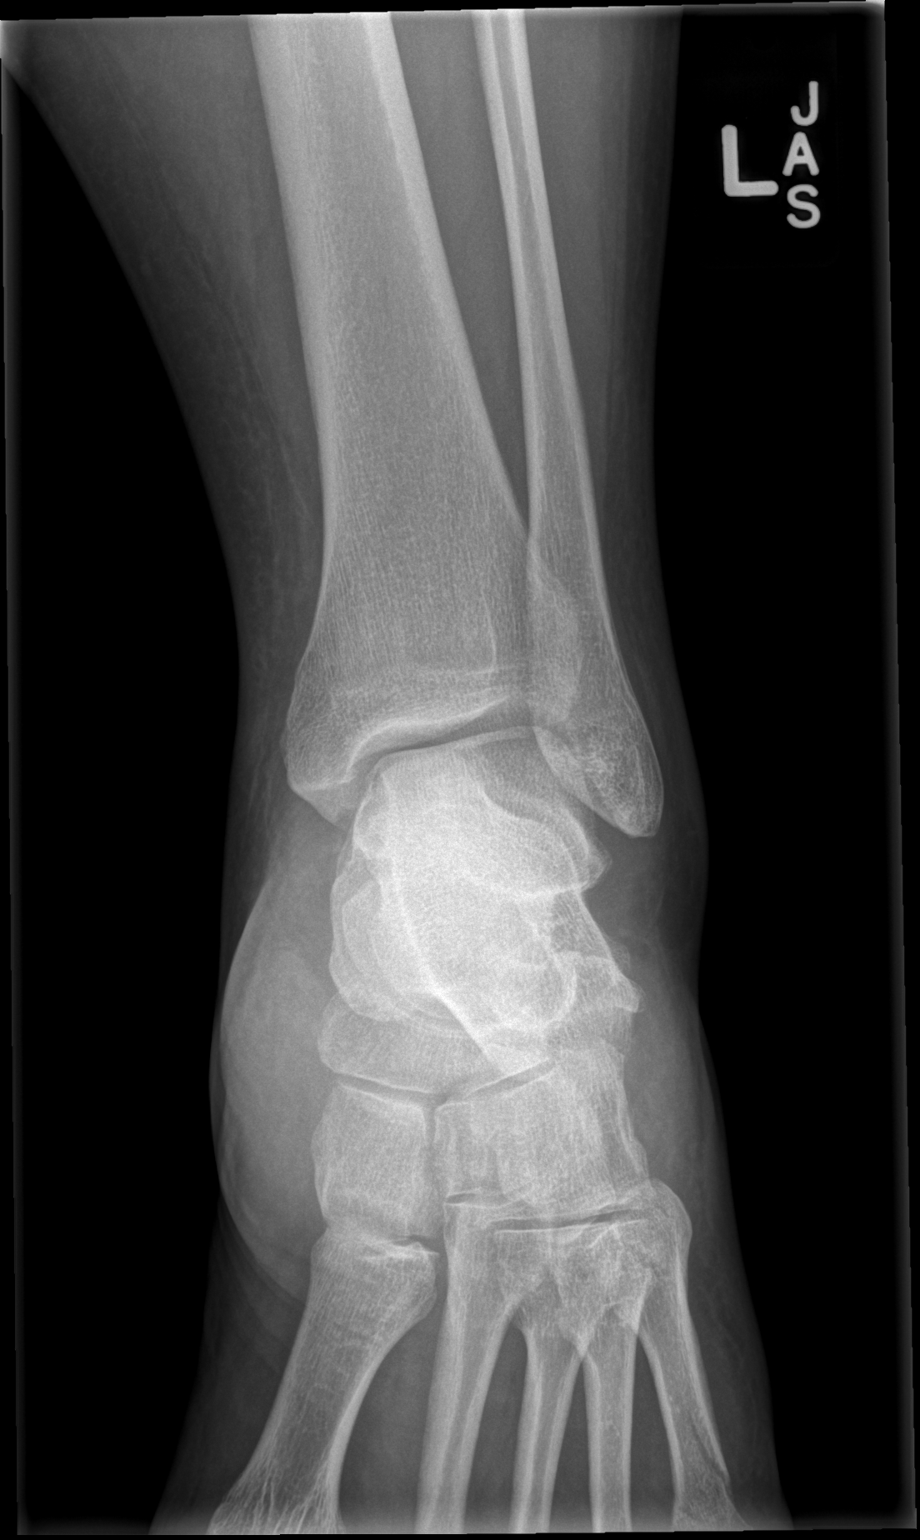

[x ankle obl left]
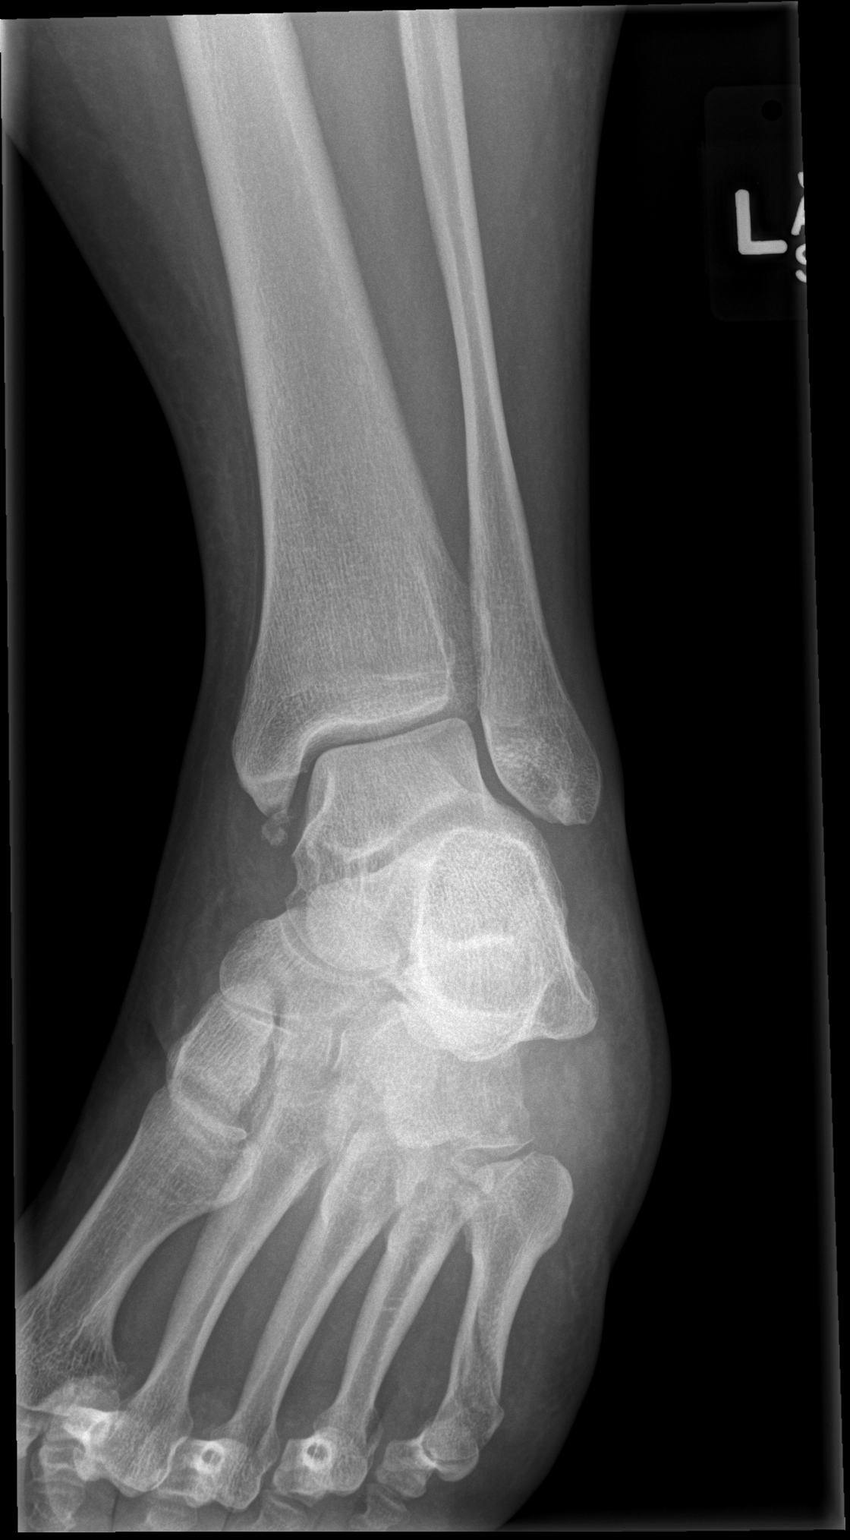

[x ankle lat left]
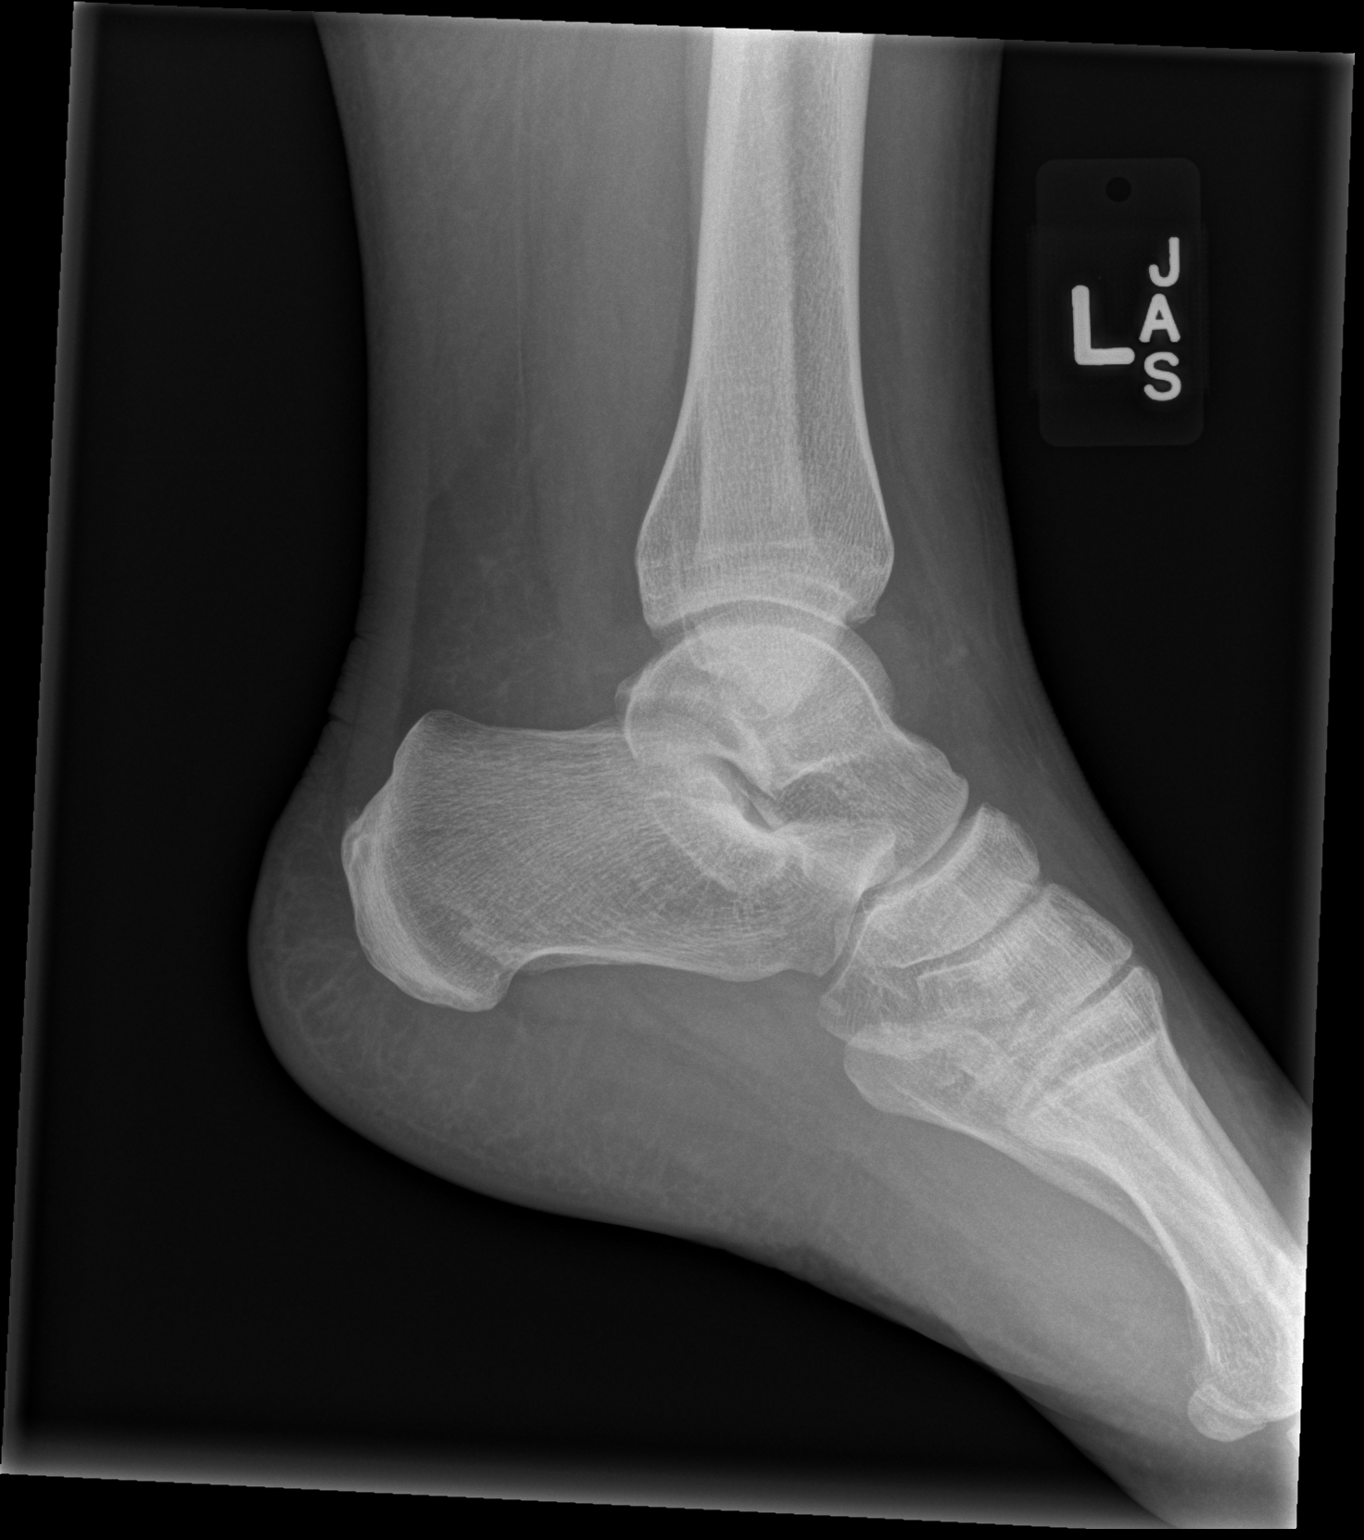

[3 of 3 positions shown; findings below may reference images not displayed]

FINDINGS: Again noted are tiny bony fragments adjacent to the tip
of the medial malleolus, suspicious or subacute avulsion fractures.
The previously noted suspected avulsion fracture from the lateral
aspect of the talus are no longer clearly identified (this may be
projectional).  No new acute fracture is identified.  Ankle mortise
is preserved.  Overlying soft tissues appear swollen.  Spiral
fracture through the fifth metatarsal is again noted.
IMPRESSION: 1.  Soft tissue swelling around the ankle joint with a small
avulsion fracture from the tip of the medial malleolus (subacute).
Previously noted avulsion fracture from the lateral aspect of the
talus and is no longer clearly identified (this is likely
projectional).
2.  Spiral fracture of the fifth metatarsal redemonstrated.
3.  No definite new bony injury is identified.

## 2013-07-25 ENCOUNTER — Telehealth: Payer: Self-pay

## 2013-07-25 NOTE — Telephone Encounter (Signed)
Pt states that her psychiatrist called this in for her, so she will no longer need for [place] to call this in.

## 2013-07-25 NOTE — Telephone Encounter (Signed)
Pt states that she is having a vaginal herpes outbreak, she would like to know if we could call her in valtrex. She is not able to come in at the moment because of inability to pay. Best# [phone]

## 2013-08-07 ENCOUNTER — Emergency Department (HOSPITAL_COMMUNITY)
Admission: EM | Admit: 2013-08-07 | Discharge: 2013-08-07 | Disposition: A | Discharge status: Home / Self Care | Payer: Medicare Other | Attending: Emergency Medicine | Admitting: Emergency Medicine

## 2013-08-07 ENCOUNTER — Encounter (HOSPITAL_COMMUNITY): Payer: Self-pay | Admitting: Emergency Medicine

## 2013-08-07 DIAGNOSIS — R61 Generalized hyperhidrosis: Secondary | ICD-10-CM | POA: Insufficient documentation

## 2013-08-07 DIAGNOSIS — Z8619 Personal history of other infectious and parasitic diseases: Secondary | ICD-10-CM | POA: Insufficient documentation

## 2013-08-07 DIAGNOSIS — Z8719 Personal history of other diseases of the digestive system: Secondary | ICD-10-CM | POA: Insufficient documentation

## 2013-08-07 DIAGNOSIS — F411 Generalized anxiety disorder: Secondary | ICD-10-CM | POA: Insufficient documentation

## 2013-08-07 DIAGNOSIS — Z87891 Personal history of nicotine dependence: Secondary | ICD-10-CM | POA: Insufficient documentation

## 2013-08-07 DIAGNOSIS — Z79899 Other long term (current) drug therapy: Secondary | ICD-10-CM | POA: Insufficient documentation

## 2013-08-07 DIAGNOSIS — G8929 Other chronic pain: Secondary | ICD-10-CM | POA: Insufficient documentation

## 2013-08-07 DIAGNOSIS — M1710 Unilateral primary osteoarthritis, unspecified knee: Secondary | ICD-10-CM | POA: Insufficient documentation

## 2013-08-07 DIAGNOSIS — R51 Headache: Secondary | ICD-10-CM | POA: Insufficient documentation

## 2013-08-07 DIAGNOSIS — E039 Hypothyroidism, unspecified: Secondary | ICD-10-CM | POA: Insufficient documentation

## 2013-08-07 DIAGNOSIS — L509 Urticaria, unspecified: Secondary | ICD-10-CM | POA: Insufficient documentation

## 2013-08-07 DIAGNOSIS — IMO0002 Reserved for concepts with insufficient information to code with codable children: Secondary | ICD-10-CM

## 2013-08-07 DIAGNOSIS — R519 Headache, unspecified: Secondary | ICD-10-CM

## 2013-08-07 DIAGNOSIS — Z880 Allergy status to penicillin: Secondary | ICD-10-CM | POA: Insufficient documentation

## 2013-08-07 DIAGNOSIS — G4730 Sleep apnea, unspecified: Secondary | ICD-10-CM | POA: Insufficient documentation

## 2013-08-07 DIAGNOSIS — Z8544 Personal history of malignant neoplasm of other female genital organs: Secondary | ICD-10-CM | POA: Insufficient documentation

## 2013-08-07 DIAGNOSIS — R110 Nausea: Secondary | ICD-10-CM | POA: Insufficient documentation

## 2013-08-07 MED ORDER — KETOROLAC TROMETHAMINE 30 MG/ML IJ SOLN
30.0000 mg | Freq: Once | INTRAMUSCULAR | Status: AC
  Administered 2013-08-07: 30 mg via INTRAVENOUS
  Filled 2013-08-07: qty 1

## 2013-08-07 MED ORDER — HYDROMORPHONE HCL PF 1 MG/ML IJ SOLN
1.0000 mg | Freq: Once | INTRAMUSCULAR | Status: AC
  Administered 2013-08-07: 1 mg via INTRAVENOUS
  Filled 2013-08-07: qty 1

## 2013-08-07 MED ORDER — PROMETHAZINE HCL 25 MG/ML IJ SOLN
12.5000 mg | Freq: Once | INTRAMUSCULAR | Status: AC
  Administered 2013-08-07: 12.5 mg via INTRAVENOUS
  Filled 2013-08-07: qty 1

## 2013-08-07 MED ORDER — DEXAMETHASONE SODIUM PHOSPHATE 10 MG/ML IJ SOLN
10.0000 mg | Freq: Once | INTRAMUSCULAR | Status: AC
  Administered 2013-08-07: 10 mg via INTRAVENOUS
  Filled 2013-08-07: qty 1

## 2013-08-07 MED ORDER — FENTANYL CITRATE 0.05 MG/ML IJ SOLN
100.0000 ug | Freq: Once | INTRAMUSCULAR | Status: AC
  Administered 2013-08-07: 100 ug via INTRAVENOUS
  Filled 2013-08-07: qty 2

## 2013-08-07 MED ORDER — KETOROLAC TROMETHAMINE 30 MG/ML IJ SOLN: 30.0000 mg | Freq: Once | INTRAMUSCULAR | Status: DC

## 2013-08-07 MED ORDER — DEXAMETHASONE SODIUM PHOSPHATE 10 MG/ML IJ SOLN: 10.0000 mg | Freq: Once | INTRAMUSCULAR | Status: DC

## 2013-08-07 MED ORDER — DIPHENHYDRAMINE HCL 50 MG/ML IJ SOLN
25.0000 mg | Freq: Once | INTRAMUSCULAR | Status: AC
  Administered 2013-08-07: 25 mg via INTRAVENOUS
  Filled 2013-08-07: qty 1

## 2013-08-07 MED ORDER — PROMETHAZINE HCL 25 MG/ML IJ SOLN
12.5000 mg | Freq: Once | INTRAMUSCULAR | Status: DC
  Filled 2013-08-07: qty 1

## 2013-08-07 MED ORDER — DIPHENHYDRAMINE HCL 50 MG/ML IJ SOLN: 25.0000 mg | Freq: Once | INTRAMUSCULAR | Status: DC

## 2013-08-07 MED ORDER — FENTANYL CITRATE 0.05 MG/ML IJ SOLN
50.0000 ug | Freq: Once | INTRAMUSCULAR | Status: AC
  Administered 2013-08-07: 50 ug via INTRAVENOUS
  Filled 2013-08-07: qty 2

## 2013-08-07 NOTE — ED Notes (Signed)
Bed: EE10 Expected date:  Expected time:  Means of arrival:  Comments: Hold for fast track 8

## 2013-08-07 NOTE — ED Provider Notes (Signed)
CSN: [id number]     Arrival date & time 08/07/13  4098 History  This chart was scribed for non-physician practitioner Hazel Sams, working with Janice Norrie, MD by Donato Schultz, ED Scribe. This patient was seen in room WTR8/WTR8 and the patient's care was started at 8:40 PM.    Chief Complaint  Patient presents with  . Migraine    HPI HPI Comments: [name] Will is a 38 y.o. female who presents to the Emergency Department complaining of a constant migraine located in the back of her head that started yesterday morning after she woke up.  She states that the headache worsened within the last 12 hours.  She states that she took 800 mg of Ibuprofen, Biofreeze, ice, peppermint oil, and lavender oil for her pain with no relief.  She lists nausea, chills, and diaphoresis as associated symptoms.  She denies confusion, fever, chills, and emesis as associated symptoms.She states that she has an allergy to Penicillin and Quinolines.  She states that she would rather not have Reglan because it makes her feel anxious.     Past Medical History  Diagnosis Date  . Narcotic abuse in remission   . Thyroid disease   . Chronic back pain   . Hypothyroidism   . Anxiety   . GERD (gastroesophageal reflux disease)   . Sleep apnea     cpap  . Headache(784.0)   . Neuromuscular disorder     neuropathy  . Arthritis     knees bilateral   DJD  stenosis   . Blood dyscrasia     HSV  . Cancer     vaginal  malignant carcinoma  . PCOS (polycystic ovarian syndrome)    Past Surgical History  Procedure Laterality Date  . Back surgery    . Cesarean section    . Tonsillectomy    . Tubal ligation    . Fracture surgery     Family History  Problem Relation Age of Onset  . Arthritis Mother   . Clotting disorder Mother   . Hyperlipidemia Father   . Leukemia Maternal Grandmother   . Diabetes Maternal Grandfather   . Stroke Paternal Grandmother    History  Substance Use Topics  . Smoking status: Former Smoker  -- 0.50 packs/[place] for 20 years    Types: Cigarettes  . Smokeless tobacco: Never Used  . Alcohol Use: No     Comment: heavy   OB History   Grav Para Term Preterm Abortions TAB SAB Ect Mult Living                 Review of Systems  Constitutional: Positive for diaphoresis. Negative for fever and chills.  Gastrointestinal: Positive for nausea. Negative for vomiting.  Neurological: Positive for headaches.  Psychiatric/Behavioral: Negative for confusion.  All other systems reviewed and are negative.    Allergies  Avelox; Levofloxacin; Penicillins; and Quinolones  Home Medications   Current Outpatient Rx  Name  Route  Sig  Dispense  Refill  . DAILY MULTIPLE VITAMINS PO   Oral   Take 1 tablet by mouth daily.         Marland Kitchen gabapentin (NEURONTIN) 300 MG capsule   Oral   Take 300 mg by mouth 2 (two) times daily.         Marland Kitchen ibuprofen (ADVIL,MOTRIN) 200 MG tablet   Oral   Take 400 mg by mouth every 6 (six) hours as needed for headache or moderate pain.         Marland Kitchen  levothyroxine (SYNTHROID, LEVOTHROID) 50 MCG tablet   Oral   Take 50 mcg by mouth daily before breakfast.         . PARoxetine (PAXIL) 40 MG tablet   Oral   Take 30 mg by mouth every morning.          Triage Vitals: BP 105/57  Pulse 94  Temp(Src) 98.8 F (37.1 C) (Oral)  Resp 18  SpO2 98%  LMP 07/17/2013  Physical Exam  Nursing note and vitals reviewed. Constitutional: She is oriented to person, place, and time. She appears well-developed and well-nourished. No distress.  HENT:  Head: Normocephalic and atraumatic.  Eyes: Conjunctivae and EOM are normal. Pupils are equal, round, and reactive to light.  Neck: Normal range of motion and phonation normal. Neck supple.  Several urticarial lesions to right back of the neck.  Cardiovascular: Normal rate and regular rhythm.   No murmur heard. Pulmonary/Chest: Effort normal and breath sounds normal. No respiratory distress. She has no wheezes. She has no  rales. She exhibits no tenderness.  Abdominal: Soft. There is no tenderness. There is no rigidity, no rebound, no guarding, no CVA tenderness and no tenderness at McBurney's point.  Musculoskeletal: Normal range of motion. She exhibits no edema and no tenderness.  Neurological: She is alert and oriented to person, place, and time. She has normal strength. No cranial nerve deficit or sensory deficit. She exhibits normal muscle tone. Gait normal.  Skin: Skin is warm and dry. No rash noted.  Psychiatric: She has a normal mood and affect. Her behavior is normal. Judgment and thought content normal.    ED Course  Procedures   DIAGNOSTIC STUDIES: Oxygen Saturation is 98% on room air, normal by my interpretation.    COORDINATION OF CARE: 8:44 PM- Discussed administering medication in the ED for nausea and her migraine.  Patient was also concerned for mild irritation and bumps to the back of her neck.  Associated with urticarial rash associated with the Biofreeze she applied to that area.  The patient agreed to the treatment plan.   Patient reports no significant improvements after initial migraine cocktail. Small dose of fentanyl also given.  Patient states she did have some improvements of her foot now with pain from 10 out of 10-8/10. Will re\re dose and reassess.  Patient continues to complain that she is not having significant improvements after fentanyl.  1 mg Dilaudid-HP. Patient now having significant improvements in requesting to return home.  MDM   1. Headache      I personally performed the services described in this documentation, which was scribed in my presence. The recorded information has been reviewed and is accurate.   Martie Lee, PA-C 08/07/13 [phone]

## 2013-08-07 NOTE — ED Notes (Signed)
Pt presents with c/o headache since yesterday; says her headache turned into a migraine about 7-8 hours ago.

## 2013-08-07 NOTE — Discharge Instructions (Signed)
Please followup with your primary care provider for continued evaluation and treatment of your health conditions.    Migraine Headache A migraine headache is very bad, throbbing pain on one or both sides of your head. Talk to your doctor about what things may bring on (trigger) your migraine headaches. HOME CARE  Only take medicines as told by your doctor.  Lie down in a dark, quiet room when you have a migraine.  Keep a journal to find out if certain things bring on migraine headaches. For example, write down:  What you eat and drink.  How much sleep you get.  Any change to your diet or medicines.  Lessen how much alcohol you drink.  Quit smoking if you smoke.  Get enough sleep.  Lessen any stress in your life.  Keep lights dim if bright lights bother you or make your migraines worse. GET HELP RIGHT AWAY IF:   Your migraine becomes really bad.  You have a fever.  You have a stiff neck.  You have trouble seeing.  Your muscles are weak, or you lose muscle control.  You lose your balance or have trouble walking.  You feel like you will pass out (faint), or you pass out.  You have really bad symptoms that are different than your first symptoms. MAKE SURE YOU:   Understand these instructions.  Will watch your condition.  Will get help right away if you are not doing well or get worse. Document Released: 04/21/2008 Document Revised: 10/05/2011 Document Reviewed: 03/20/2013 [facility] Patient Information 2014 [place].

## 2013-08-07 NOTE — ED Notes (Signed)
Migraine headache since yesterday; history of same; not relieved by ibuprofen

## 2013-08-08 NOTE — ED Provider Notes (Signed)
Medical screening examination/treatment/procedure(s) were performed by non-physician practitioner and as supervising physician I was immediately available for consultation/collaboration.  EKG Interpretation   None       Latori Beggs, MD, FACEP   Aengus Sauceda L Kandance Yano, MD 08/08/13 0004 

## 2013-08-15 ENCOUNTER — Encounter (HOSPITAL_COMMUNITY): Payer: Self-pay | Admitting: Emergency Medicine

## 2013-08-15 ENCOUNTER — Emergency Department (HOSPITAL_COMMUNITY)
Admission: EM | Admit: 2013-08-15 | Discharge: 2013-08-15 | Disposition: A | Discharge status: Home / Self Care | Payer: Medicare Other | Attending: Emergency Medicine | Admitting: Emergency Medicine

## 2013-08-15 DIAGNOSIS — G4730 Sleep apnea, unspecified: Secondary | ICD-10-CM | POA: Insufficient documentation

## 2013-08-15 DIAGNOSIS — Z8544 Personal history of malignant neoplasm of other female genital organs: Secondary | ICD-10-CM | POA: Insufficient documentation

## 2013-08-15 DIAGNOSIS — IMO0002 Reserved for concepts with insufficient information to code with codable children: Secondary | ICD-10-CM

## 2013-08-15 DIAGNOSIS — Z880 Allergy status to penicillin: Secondary | ICD-10-CM | POA: Insufficient documentation

## 2013-08-15 DIAGNOSIS — E039 Hypothyroidism, unspecified: Secondary | ICD-10-CM | POA: Insufficient documentation

## 2013-08-15 DIAGNOSIS — K089 Disorder of teeth and supporting structures, unspecified: Secondary | ICD-10-CM | POA: Insufficient documentation

## 2013-08-15 DIAGNOSIS — Z79899 Other long term (current) drug therapy: Secondary | ICD-10-CM | POA: Insufficient documentation

## 2013-08-15 DIAGNOSIS — K0889 Other specified disorders of teeth and supporting structures: Secondary | ICD-10-CM

## 2013-08-15 DIAGNOSIS — G8929 Other chronic pain: Secondary | ICD-10-CM | POA: Insufficient documentation

## 2013-08-15 DIAGNOSIS — F411 Generalized anxiety disorder: Secondary | ICD-10-CM | POA: Insufficient documentation

## 2013-08-15 DIAGNOSIS — Z862 Personal history of diseases of the blood and blood-forming organs and certain disorders involving the immune mechanism: Secondary | ICD-10-CM | POA: Insufficient documentation

## 2013-08-15 DIAGNOSIS — M1710 Unilateral primary osteoarthritis, unspecified knee: Secondary | ICD-10-CM | POA: Insufficient documentation

## 2013-08-15 DIAGNOSIS — Z87891 Personal history of nicotine dependence: Secondary | ICD-10-CM | POA: Insufficient documentation

## 2013-08-15 MED ORDER — OXYCODONE-ACETAMINOPHEN 5-325 MG PO TABS
2.0000 | ORAL_TABLET | Freq: Once | ORAL | Status: AC
  Administered 2013-08-15: 2 via ORAL
  Filled 2013-08-15: qty 2

## 2013-08-15 MED ORDER — HYDROMORPHONE HCL PF 1 MG/ML IJ SOLN
1.0000 mg | Freq: Once | INTRAMUSCULAR | Status: AC
  Administered 2013-08-15: 1 mg via INTRAMUSCULAR
  Filled 2013-08-15: qty 1

## 2013-08-15 NOTE — ED Notes (Signed)
Patient is alert and oriented x3.  She is complaining of dental pain that she is having after having a root canal done at 2pm. Patient refused pain medication today and states that she regrets it now.  She is requesting some pain relief while she is here.

## 2013-08-15 NOTE — ED Provider Notes (Signed)
CSN: [id number]     Arrival date & time 08/15/13  1928 History  This chart was scribed for non-physician practitioner, Junius Creamer, [place] working with Neta Ehlers, MD by Frederich Balding, ED scribe. This patient was seen in room WTR8/WTR8 and the patient's care was started at 9:33 PM.    Chief Complaint  Patient presents with  . Dental Pain   The history is provided by the patient. No language interpreter was used.   HPI Comments: [name] is a 38 y.o. female who presents to the Emergency Department complaining of constant upper and lower dental pain that started earlier today after having two root canals done. She states she refused pain medication offered earlier and still does not want any. Pt does not have a follow up appointment with the dentist that did her root canal., but will follow up with her dentist for cap placement in 4-6 weeks  She has taken ibuprofen and tramadol with no relief. Pt has also done cold compresses with some relief. Denies any other symptoms.   Past Medical History  Diagnosis Date  . Narcotic abuse in remission   . Thyroid disease   . Chronic back pain   . Hypothyroidism   . Anxiety   . GERD (gastroesophageal reflux disease)   . Sleep apnea     cpap  . Headache(784.0)   . Neuromuscular disorder     neuropathy  . Arthritis     knees bilateral   DJD  stenosis   . Blood dyscrasia     HSV  . Cancer     vaginal  malignant carcinoma  . PCOS (polycystic ovarian syndrome)    Past Surgical History  Procedure Laterality Date  . Back surgery    . Cesarean section    . Tonsillectomy    . Tubal ligation    . Fracture surgery     Family History  Problem Relation Age of Onset  . Arthritis Mother   . Clotting disorder Mother   . Hyperlipidemia Father   . Leukemia Maternal Grandmother   . Diabetes Maternal Grandfather   . Stroke Paternal Grandmother    History  Substance Use Topics  . Smoking status: Former Smoker -- 0.50 packs/[place] for 20 years   Types: Cigarettes  . Smokeless tobacco: Never Used  . Alcohol Use: No     Comment: heavy   OB History   Grav Para Term Preterm Abortions TAB SAB Ect Mult Living                 Review of Systems  HENT: Positive for dental problem.   All other systems reviewed and are negative.    Allergies  Avelox; Levofloxacin; Penicillins; and Quinolones  Home Medications   Current Outpatient Rx  Name  Route  Sig  Dispense  Refill  . DAILY MULTIPLE VITAMINS PO   Oral   Take 1 tablet by mouth daily.         Marland Kitchen gabapentin (NEURONTIN) 300 MG capsule   Oral   Take 300 mg by mouth 2 (two) times daily.         Marland Kitchen ibuprofen (ADVIL,MOTRIN) 200 MG tablet   Oral   Take 800 mg by mouth every 6 (six) hours as needed for headache or moderate pain.          Marland Kitchen levothyroxine (SYNTHROID, LEVOTHROID) 50 MCG tablet   Oral   Take 50 mcg by mouth daily before breakfast.         .  PARoxetine (PAXIL) 40 MG tablet   Oral   Take 30 mg by mouth every morning.         . traMADol (ULTRAM) 50 MG tablet   Oral   Take 100 mg by mouth every 6 (six) hours as needed for severe pain.          BP 109/76  Pulse 94  Temp(Src) 99.5 F (37.5 C) (Oral)  SpO2 96%  LMP 07/17/2013  Physical Exam  Nursing note and vitals reviewed. Constitutional: She is oriented to person, place, and time. She appears well-developed and well-nourished. No distress.  HENT:  Head: Normocephalic and atraumatic.  Eyes: EOM are normal.  Neck: Neck supple. No tracheal deviation present.  Cardiovascular: Normal rate.   Pulmonary/Chest: Effort normal. No respiratory distress.  Musculoskeletal: Normal range of motion.  Neurological: She is alert and oriented to person, place, and time.  Skin: Skin is warm and dry.  Psychiatric: She has a normal mood and affect. Her behavior is normal.    ED Course  Procedures (including critical care time)  DIAGNOSTIC STUDIES: Oxygen Saturation is 96% on RA, normal by my  interpretation.    COORDINATION OF CARE: 9:37 PM-Discussed treatment plan which includes pain medication in the ED and following up with dentist with pt at bedside and pt agreed to plan.   Labs Review Labs Reviewed - No data to display Imaging Review No results found.  EKG Interpretation   None       MDM   1. Pain, dental     Patient states, that she would just like some immediate relief of her pain in the form of an injection.  She states she is taking a Home and will not be driving  I personally performed the services described in this documentation, which was scribed in my presence. The recorded information has been reviewed and is accurate.  Garald Balding, NP 08/15/13 2206

## 2013-08-15 NOTE — Discharge Instructions (Signed)
Dental Pain Toothache is pain in or around a tooth. It may get worse with chewing or with cold or heat.  HOME CARE  Your dentist may use a numbing medicine during treatment. If so, you may need to avoid eating until the medicine wears off. Ask your dentist about this.  Only take medicine as told by your dentist or doctor.  Avoid chewing food near the painful tooth until after all treatment is done. Ask your dentist about this. GET HELP RIGHT AWAY IF:   The problem gets worse or new problems appear.  You have a fever.  There is redness and puffiness (swelling) of the face, jaw, or neck.  You cannot open your mouth.  There is pain in the jaw.  There is very bad pain that is not helped by medicine. MAKE SURE YOU:   Understand these instructions.  Will watch your condition.  Will get help right away if you are not doing well or get worse. Document Released: 12/30/2007 Document Revised: 10/05/2011 Document Reviewed: 12/30/2007 [facility] Patient Information 2014 [place], [place]. If you are still having pain tomorrow please contact your dentist or your the entist who preformed the root canal procedure for further pain control measures

## 2013-08-15 NOTE — ED Notes (Signed)
Pt states she has a ride home. 

## 2013-08-15 NOTE — ED Notes (Addendum)
MD at bedside. Dr. Docherty at bedside.  

## 2013-08-16 ENCOUNTER — Telehealth (HOSPITAL_COMMUNITY): Payer: Self-pay

## 2013-08-16 NOTE — ED Notes (Signed)
Permission obtained from pt to speak w/director of shelter where pt is staying.  Pt's DC paperwork show no medications rcvd.  Pt informed staff she did receive medication in ED.  Correct paperwork showing pt rcvd both percocet and and dilaudid faxed to facility [phone] per pt's request.

## 2013-08-16 NOTE — ED Provider Notes (Signed)
Medical screening examination/treatment/procedure(s) were performed by non-physician practitioner and as supervising physician I was immediately available for consultation/collaboration.  Neta Ehlers, MD 08/16/13 1200

## 2013-09-15 ENCOUNTER — Institutional Professional Consult (permissible substitution): Payer: Medicare Other | Admitting: Neurology

## 2013-09-18 ENCOUNTER — Encounter: Payer: Self-pay | Admitting: Cardiovascular Disease

## 2013-09-19 ENCOUNTER — Ambulatory Visit: Payer: Self-pay | Admitting: Cardiovascular Disease

## 2013-10-08 ENCOUNTER — Ambulatory Visit (INDEPENDENT_AMBULATORY_CARE_PROVIDER_SITE_OTHER): Payer: Medicare Other | Admitting: Family Medicine

## 2013-10-08 VITALS — BP 110/84 | HR 80 | Temp 98.7°F | Resp 16 | Ht 66.0 in | Wt 303.0 lb

## 2013-10-08 DIAGNOSIS — E039 Hypothyroidism, unspecified: Secondary | ICD-10-CM

## 2013-10-08 DIAGNOSIS — F39 Unspecified mood [affective] disorder: Secondary | ICD-10-CM

## 2013-10-08 DIAGNOSIS — M5416 Radiculopathy, lumbar region: Secondary | ICD-10-CM

## 2013-10-08 DIAGNOSIS — F1311 Sedative, hypnotic or anxiolytic abuse, in remission: Secondary | ICD-10-CM

## 2013-10-08 DIAGNOSIS — L0293 Carbuncle, unspecified: Secondary | ICD-10-CM

## 2013-10-08 DIAGNOSIS — B001 Herpesviral vesicular dermatitis: Secondary | ICD-10-CM | POA: Insufficient documentation

## 2013-10-08 DIAGNOSIS — J0100 Acute maxillary sinusitis, unspecified: Secondary | ICD-10-CM

## 2013-10-08 DIAGNOSIS — M549 Dorsalgia, unspecified: Secondary | ICD-10-CM

## 2013-10-08 DIAGNOSIS — F1111 Opioid abuse, in remission: Secondary | ICD-10-CM

## 2013-10-08 DIAGNOSIS — L0292 Furuncle, unspecified: Secondary | ICD-10-CM

## 2013-10-08 DIAGNOSIS — G8929 Other chronic pain: Secondary | ICD-10-CM

## 2013-10-08 LAB — POCT CBC
Granulocyte percent: 71 %G (ref 37–80)
HCT, POC: 42.1 % (ref 37.7–47.9)
Hemoglobin: 13.5 g/dL (ref 12.2–16.2)
Lymph, poc: 2.2 (ref 0.6–3.4)
MCH, POC: 31 pg (ref 27–31.2)
MCHC: 32.1 g/dL (ref 31.8–35.4)
MCV: 96.5 fL (ref 80–97)
MID (CBC): 0.5 (ref 0–0.9)
MPV: 10.2 fL (ref 0–99.8)
PLATELET COUNT, POC: 280 10*3/uL (ref 142–424)
POC Granulocyte: 6.5 (ref 2–6.9)
POC LYMPH PERCENT: 24.1 %L (ref 10–50)
POC MID %: 4.9 % (ref 0–12)
RBC: 4.36 M/uL (ref 4.04–5.48)
RDW, POC: 13.1 %
WBC: 9.2 10*3/uL (ref 4.6–10.2)

## 2013-10-08 MED ORDER — LEVOTHYROXINE SODIUM 50 MCG PO TABS: 50.0000 ug | ORAL_TABLET | Freq: Every day | ORAL | Status: DC

## 2013-10-08 MED ORDER — MOMETASONE FUROATE 50 MCG/ACT NA SUSP: 2.0000 | Freq: Every day | NASAL | Status: DC

## 2013-10-08 MED ORDER — GABAPENTIN 800 MG PO TABS: 800.0000 mg | ORAL_TABLET | Freq: Two times a day (BID) | ORAL | Status: DC

## 2013-10-08 MED ORDER — PAROXETINE HCL 40 MG PO TABS: 40.0000 mg | ORAL_TABLET | ORAL | Status: DC

## 2013-10-08 MED ORDER — DOXYCYCLINE HYCLATE 100 MG PO CAPS: 100.0000 mg | ORAL_CAPSULE | Freq: Two times a day (BID) | ORAL | Status: DC

## 2013-10-08 MED ORDER — VALACYCLOVIR HCL 1 G PO TABS: 1000.0000 mg | ORAL_TABLET | Freq: Two times a day (BID) | ORAL | Status: DC

## 2013-10-08 NOTE — Progress Notes (Signed)
Subjective:    Patient ID: [name], female    DOB: 19-Dec-1975, 38 y.o.   MRN: [id number]  Sinusitis Associated symptoms include chills, congestion and sinus pressure. Pertinent negatives include no coughing, diaphoresis, ear pain, headaches, shortness of breath or sore throat.   Chief Complaint  Patient presents with   Sinusitis    Has had sinus infection for about 2 months with yellowish-green nasal discharge.   Spot on inner left thigh    Has been there for about a week and would like to be checked for MRSA. 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   Fever Blister    Having the tingling sesation on her lip like the start of a fever blister so she would like some Valtrex. She does get them often.    Medication Refill    Need refills on Neurontin, Paxil and Synthroid. Would also like to have some labs done to check thyroids, will discuss further with provider.    This chart was scribed for Wardell Honour, MD by Thea Alken, ED Scribe. This patient was seen in room 4 and the patient's care was started at 4:46 PM.  HPI Comments: [name] is a 38 y.o. female who presents to the [facility] complaining of a sinus infection onset 2 months with associated with yellow green nasal discharge,congestion, chills. She denies any new health problem. Pt denies fever,sweats, ear pain, sore throat. Pt reports that she has not taken any OTC medication.   Pt is also complaining of possible recurrent MRSA infection. Pt reports that she has spots on her inner thigh with discharge. She reports that she may be having spots arising on her forehead as well.   Pt is concerned that she may be developing a fever blister. She reports that her boyfriend currently has 3 on his lip. Pt is requesting valtrex refill which as worked well in the past; she usually takes 1000mg  bid x 5 days.  Pt reports that she has been having trouble sleeping and would like to have her thyroid check.  S/p Graves disease with subsequent hypothyroidism.  Did not warrant ablative therapy.  Pt reports that she has had 6 back surgeries and reports that she still takes neurontin for neuropathy. Pt reports that she used to take 6 medication but she is now down to 3. She states that she would like to start losing weight. Pt reports that she has quit smoking 6 months ago.  Pt reports that she has been seeing Dr. Laney Pastor she was 70. 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 She reports that she has been volunteering at [employer] center. Pt reports that this will be her first year of sobriety.  She currently is living in a group home for recovering addicts; her daughter lives with her.  Last visit with Dr. Laney Pastor 01/2013.   ED visit in 07/2013 for dental pain; requested injectable pain medication only.  ED visit 04/13/2013 and 04/19/13 for alcohol abuse/toxicity though reports sobriety for almost one year.  Past Medical History  Diagnosis Date   Narcotic abuse in remission    Thyroid disease    Chronic back pain    Hypothyroidism    Anxiety    GERD (gastroesophageal reflux disease)    Sleep apnea     cpap   Headache(784.0)    Neuromuscular  disorder     neuropathy   Arthritis     knees bilateral   DJD  stenosis    Blood dyscrasia     HSV   Cancer     vaginal  malignant carcinoma   PCOS (polycystic ovarian syndrome)    Allergies  Allergen Reactions   Avelox [Moxifloxacin Hcl In Nacl] Hives   Levofloxacin Hives   Penicillins Hives   Quinolones Hives   Prior to Admission medications   Medication Sig Start Date End Date Taking? Authorizing Provider  DAILY MULTIPLE VITAMINS PO Take 1 tablet by mouth daily.   Yes Historical Provider, MD  gabapentin (NEURONTIN) 800 MG tablet Take 800 mg by mouth 2 (two) times daily.   Yes Historical Provider, MD  ibuprofen  (ADVIL,MOTRIN) 200 MG tablet Take 800 mg by mouth every 6 (six) hours as needed for headache or moderate pain.    Yes Historical Provider, MD  levothyroxine (SYNTHROID, LEVOTHROID) 50 MCG tablet Take 50 mcg by mouth daily before breakfast.   Yes Historical Provider, MD  PARoxetine (PAXIL) 40 MG tablet Take 30 mg by mouth every morning. 11/01/12  Yes Leandrew Koyanagi, MD  gabapentin (NEURONTIN) 300 MG capsule Take 300 mg by mouth 2 (two) times daily.    Historical Provider, MD  traMADol (ULTRAM) 50 MG tablet Take 100 mg by mouth every 6 (six) hours as needed for severe pain.    Historical Provider, MD   History   Social History   Marital Status: Divorced    Spouse Name: N/A    Number of Children: N/A   Years of Education: N/A   Occupational History   disability     Therapist, sports; Psychologist, occupational at substance abuse counselor at [place] History Main Topics   Smoking status: Former Smoker -- 0.50 packs/[place] for 20 years    Types: Cigarettes   Smokeless tobacco: Never Used   Alcohol Use: No     Comment: heavy   Drug Use: No     Comment: former narcotic abuse   Sexual Activity: Yes    Patent examiner Protection: Surgical   Other Topics Concern   Not on file   Social History Narrative   No narrative on file    Review of Systems  Constitutional: Positive for chills. Negative for fever and diaphoresis.  HENT: Positive for congestion, postnasal drip, rhinorrhea and sinus pressure. Negative for ear pain, sore throat and voice change.   Respiratory: Negative for cough and shortness of breath.   Endocrine: Negative for cold intolerance, heat intolerance, polydipsia, polyphagia and polyuria.  Musculoskeletal: Positive for back pain and myalgias.  Skin: Positive for color change and wound.  Neurological: Positive for numbness. Negative for dizziness, weakness, light-headedness and headaches.  Psychiatric/Behavioral: Positive for sleep disturbance. Negative for dysphoric mood. The  patient is not nervous/anxious.        Objective:   Physical Exam  Nursing note and vitals reviewed. Constitutional: She is oriented to person, place, and time. She appears well-developed and well-nourished. No distress.  HENT:  Head: Normocephalic and atraumatic.  Right Ear: Hearing, tympanic membrane, external ear and ear canal normal.  Left Ear: Hearing, tympanic membrane, external ear and ear canal normal.  Mouth/Throat: No oropharyngeal exudate.  Immense amount of drainage in throat.   Eyes: Conjunctivae and EOM are normal. Pupils are equal, round, and reactive to light.  Neck: Normal range of motion. Neck supple. No thyromegaly present.  Cardiovascular: Normal rate, regular rhythm and normal heart  sounds.   No murmur heard. Pulmonary/Chest: Effort normal and breath sounds normal. No respiratory distress. She has no wheezes. She has no rales.  Musculoskeletal: Normal range of motion.  Lymphadenopathy:    She has no cervical adenopathy.  Neurological: She is alert and oriented to person, place, and time.  Skin: Skin is warm and dry. She is not diaphoretic.  L inner thigh with 8 mm diameter fluctuant pustule; 20 gauge needle advanced into wound with scant blood and drainage.    Psychiatric: She has a normal mood and affect. Her behavior is normal. Judgment and thought content normal.      Results for orders placed in visit on 10/08/13  POCT CBC      Result Value Ref Range   WBC 9.2  4.6 - 10.2 K/uL   Lymph, poc 2.2  0.6 - 3.4   POC LYMPH PERCENT 24.1  10 - 50 %L   MID (cbc) 0.5  0 - 0.9   POC MID % 4.9  0 - 12 %M   POC Granulocyte 6.5  2 - 6.9   Granulocyte percent 71.0  37 - 80 %G   RBC 4.36  4.04 - 5.48 M/uL   Hemoglobin 13.5  12.2 - 16.2 g/dL   HCT, POC 42.1  37.7 - 47.9 %   MCV 96.5  80 - 97 fL   MCH, POC 31.0  27 - 31.2 pg   MCHC 32.1  31.8 - 35.4 g/dL   RDW, POC 13.1     Platelet Count, POC 280  142 - 424 K/uL   MPV 10.2  0 - 99.8 fL    Assessment & Plan:    1. Narcotic abuse in remission   2. Chronic back pain   3. Mood disorder   4. Hypothyroidism   5. Acute maxillary sinusitis   6. Carbuncle   7. Lumbar radiculopathy, chronic   8. Herpes labialis    1.  Acute maxillary sinusitis:  New. Rx for Doxycycline provided; rx for Nasonex also provided. 2.  Carbuncle:  New.  L inner thigh; history of MRSA; wound culture obtained; rx for Doxycycline. 3.  Mood disorder: stable and improving with sobriety; refill of Paxil 40mg  daily provided; follow-up with Laney Pastor in three months to discuss weaning Paxil. 4.  Hypothyroidism: stable; obtain labs; refill of Synthroid x 3 months. Follow-up with Laney Pastor. 5.  Polysubstance abuse in remission: new remission; last ED visit 03/2013 for alcohol toxicity.   6. Lumbar neuropathy:  Stable; chronic low back pain.  Refill of Neurontin 800mg  bid.  7. HSV labialis:  Recurrent; refill of Valtrex provided.   Meds ordered this encounter  Medications   DISCONTD: gabapentin (NEURONTIN) 800 MG tablet    Sig: Take 800 mg by mouth 2 (two) times daily.   gabapentin (NEURONTIN) 800 MG tablet    Sig: Take 1 tablet (800 mg total) by mouth 2 (two) times daily.    Dispense:  60 tablet    Refill:  3   levothyroxine (SYNTHROID, LEVOTHROID) 50 MCG tablet    Sig: Take 1 tablet (50 mcg total) by mouth daily before breakfast.    Dispense:  30 tablet    Refill:  3   PARoxetine (PAXIL) 40 MG tablet    Sig: Take 1 tablet (40 mg total) by mouth every morning.    Dispense:  30 tablet    Refill:  3   doxycycline (VIBRAMYCIN) 100 MG capsule    Sig: Take 1 capsule (100 mg  total) by mouth 2 (two) times daily.    Dispense:  20 capsule    Refill:  0   valACYclovir (VALTREX) 1000 MG tablet    Sig: Take 1 tablet (1,000 mg total) by mouth 2 (two) times daily.    Dispense:  20 tablet    Refill:  3   mometasone (NASONEX) 50 MCG/ACT nasal spray    Sig: Place 2 sprays into the nose daily.    Dispense:  17 g    Refill:  3    I personally performed the services described in this documentation, which was scribed in my presence.  The recorded information has been reviewed and is accurate.  Reginia Forts, M.D.  [facility] [address] [place], [place]  [address] [phone] phone [phone] fax

## 2013-10-09 ENCOUNTER — Ambulatory Visit (INDEPENDENT_AMBULATORY_CARE_PROVIDER_SITE_OTHER): Payer: Medicare Other | Admitting: Neurology

## 2013-10-09 ENCOUNTER — Encounter: Payer: Self-pay | Admitting: Neurology

## 2013-10-09 VITALS — BP 131/80 | HR 83 | Temp 98.6°F | Ht 65.5 in | Wt 305.0 lb

## 2013-10-09 DIAGNOSIS — F1910 Other psychoactive substance abuse, uncomplicated: Secondary | ICD-10-CM

## 2013-10-09 DIAGNOSIS — G4700 Insomnia, unspecified: Secondary | ICD-10-CM

## 2013-10-09 DIAGNOSIS — G4710 Hypersomnia, unspecified: Secondary | ICD-10-CM

## 2013-10-09 DIAGNOSIS — G4733 Obstructive sleep apnea (adult) (pediatric): Secondary | ICD-10-CM

## 2013-10-09 DIAGNOSIS — R400 Somnolence: Secondary | ICD-10-CM

## 2013-10-09 DIAGNOSIS — F411 Generalized anxiety disorder: Secondary | ICD-10-CM

## 2013-10-09 LAB — COMPREHENSIVE METABOLIC PANEL
ALT: 28 U/L (ref 0–35)
AST: 28 U/L (ref 0–37)
Albumin: 4.4 g/dL (ref 3.5–5.2)
Alkaline Phosphatase: 52 U/L (ref 39–117)
BUN: 12 mg/dL (ref 6–23)
CHLORIDE: 102 meq/L (ref 96–112)
CO2: 26 mEq/L (ref 19–32)
CREATININE: 0.99 mg/dL (ref 0.50–1.10)
Calcium: 9.8 mg/dL (ref 8.4–10.5)
Glucose, Bld: 88 mg/dL (ref 70–99)
Potassium: 4.4 mEq/L (ref 3.5–5.3)
SODIUM: 137 meq/L (ref 135–145)
Total Bilirubin: 0.3 mg/dL (ref 0.2–1.2)
Total Protein: 6.7 g/dL (ref 6.0–8.3)

## 2013-10-09 LAB — T4, FREE: FREE T4: 1.17 ng/dL (ref 0.80–1.80)

## 2013-10-09 LAB — TSH: TSH: 2.444 u[IU]/mL (ref 0.350–4.500)

## 2013-10-09 NOTE — Patient Instructions (Addendum)
We will do another sleep study to confirm your sleep apnea and get you back on CPAP. In the meantime, I am going to try to get you on a loaner CPAP machine to put you back on treatment.

## 2013-10-09 NOTE — Progress Notes (Signed)
Subjective:    Patient ID: [name] is a 38 y.o. female.  HPI    Star Age, MD, PhD [facility] [address].O. [place], [place] [address]  Dear Dr. Joseph Art,   I saw your patient, [name], upon your kind request in my neurologic clinic today for initial consultation of her sleep disorder, in particular her history of OSA. The patient is unaccompanied today. As you know, [name] is a 38 year old right-handed woman with an underlying medical history of obesity, mood disorder, hypothyroidism, substance abuse (narcotic, nicotine and EtOH), in remission, smoking, chronic back pain and prior diagnosis of obstructive sleep apnea on CPAP who needs re-evaluation to qualify for ongoing treatment with CPAP. She was diagnosed with obstructive sleep apnea at [facility] on 12/05/2009. I reviewed her split-night sleep study report: Her sleep efficiency was high normal at 97.1% with a latency to sleep of 0 minutes. She had an increased percentage of stage II sleep as well as deep sleep and absence of REM sleep and the baseline portion of the study. Her total AHI was 54.2 per hour. Baseline oxygen saturation was only 85% with a nadir of 67%. Snoring was loud. During the treatment portion of the study she had minimal amount of REM sleep and an increased percentage of stage II sleep and absence of deep sleep. She was titrated from 4-17 cm and on the final pressure of 17 cm per AHI was 3 per hour. She was started on CPAP of 17 cm with a full face mask. She has had an abnormal HST some 2 years ago too. She has severe EDS and her ESS is 22/24. She has been without CPAP for about 1 years due machine breaking. She had a relapse in her addition in the last year. She has trouble going to sleep. She has tried herbal tea, melatonin, ambien, trazodone, remeron and the only thing that helped was Ambien, and benzodiazepines, but with her addition history, these are  contraindicated. She goes to bed between 9:30 to 10:30 PM and it takes her at least 3 hours to go to sleep and sleep is non-restorative. She wakes up 2-3 times to go to the bathroom. She has frequent morning headaches in the back of her head.   She denies cataplexy, sleep paralysis, hypnagogic or hypnopompic hallucinations, or sleep attacks. She does not report any vivid dreams, nightmares, dream enactments, or parasomnias, such as sleep talking or sleep walking. She consumes 1 caffeinated beverages per [place], usually in the form of 1 cup of coffee in the morning.   Her Past Medical History Is Significant For: Past Medical History  Diagnosis Date  . Narcotic abuse in remission   . Thyroid disease   . Chronic back pain   . Hypothyroidism   . Anxiety   . GERD (gastroesophageal reflux disease)   . Sleep apnea     cpap  . Headache(784.0)   . Neuromuscular disorder     neuropathy  . Arthritis     knees bilateral   DJD  stenosis   . Blood dyscrasia     HSV  . Cancer     vaginal  malignant carcinoma  . PCOS (polycystic ovarian syndrome)     Her Past Surgical History Is Significant For: Past Surgical History  Procedure Laterality Date  . Back surgery    . Cesarean section    . Tonsillectomy    . Tubal ligation    . Fracture surgery  Her Family History Is Significant For: Family History  Problem Relation Age of Onset  . Arthritis Mother   . Clotting disorder Mother   . Hyperlipidemia Father   . Leukemia Maternal Grandmother   . Diabetes Maternal Grandfather   . Stroke Paternal Grandmother   . Heart disease Father   . Diabetes Mother   . Hypertension Mother   . Prostate cancer Father   . Heart disease Paternal Grandmother     Her Social History Is Significant For: History   Social History  . Marital Status: Divorced    Spouse Name: N/A    Number of Children: N/A  . Years of Education: N/A   Occupational History  . disability     RN; Psychologist, occupational at substance abuse  counselor at [place] History Main Topics  . Smoking status: Former Smoker -- 0.50 packs/[place] for 20 years    Types: Cigarettes  . Smokeless tobacco: Never Used  . Alcohol Use: No     Comment: heavy  . Drug Use: No     Comment: former narcotic abuse  . Sexual Activity: Yes    Birth Control/ Protection: Surgical   Other Topics Concern  . None   Social History Narrative  . None    Her Allergies Are:  Allergies  Allergen Reactions  . Avelox [Moxifloxacin Hcl In Nacl] Hives  . Levofloxacin Hives  . Penicillins Hives  . Quinolones Hives  :   Her Current Medications Are:  Outpatient Encounter Prescriptions as of 10/09/2013  Medication Sig  . DAILY MULTIPLE VITAMINS PO Take 1 tablet by mouth daily.  Marland Kitchen doxycycline (VIBRAMYCIN) 100 MG capsule Take 1 capsule (100 mg total) by mouth 2 (two) times daily.  Marland Kitchen gabapentin (NEURONTIN) 800 MG tablet Take 1 tablet (800 mg total) by mouth 2 (two) times daily.  Marland Kitchen ibuprofen (ADVIL,MOTRIN) 200 MG tablet Take 800 mg by mouth every 6 (six) hours as needed for headache or moderate pain.   Marland Kitchen levothyroxine (SYNTHROID, LEVOTHROID) 50 MCG tablet Take 1 tablet (50 mcg total) by mouth daily before breakfast.  . mometasone (NASONEX) 50 MCG/ACT nasal spray Place 2 sprays into the nose daily.  Marland Kitchen PARoxetine (PAXIL) 40 MG tablet Take 1 tablet (40 mg total) by mouth every morning.  . valACYclovir (VALTREX) 1000 MG tablet Take 1 tablet (1,000 mg total) by mouth 2 (two) times daily.  . [DISCONTINUED] gabapentin (NEURONTIN) 300 MG capsule Take 300 mg by mouth 2 (two) times daily.  . [DISCONTINUED] traMADol (ULTRAM) 50 MG tablet Take 100 mg by mouth every 6 (six) hours as needed for severe pain.  :  Review of Systems:  Out of a complete 14 point review of systems, all are reviewed and negative with the exception of these symptoms as listed below:   Review of Systems  Constitutional: Positive for fatigue.  HENT: Negative.   Eyes: Negative.    Respiratory:       Snoring   Cardiovascular: Positive for chest pain.  Gastrointestinal: Negative.   Endocrine: Positive for heat intolerance.  Genitourinary: Negative.   Musculoskeletal: Positive for arthralgias.  Skin: Negative.   Allergic/Immunologic: Negative.   Neurological: Positive for dizziness, weakness and numbness.       Memory loss  Hematological: Bruises/bleeds easily.  Psychiatric/Behavioral: Positive for sleep disturbance (eds,insomnia, snoring, restless legs, shift work).    Objective:  Neurologic Exam  Physical Exam Physical Examination:   Filed Vitals:   10/09/13 0820  BP: 131/80  Pulse: 83  Temp: 98.6 F (37 C)    General Examination: The patient is a very pleasant 38 y.o. female in no acute distress. She appears well-developed and well-nourished and well groomed.   HEENT: Normocephalic, atraumatic, pupils are equal, round and reactive to light and accommodation. Funduscopic exam is normal with sharp disc margins noted. Extraocular tracking is good without limitation to gaze excursion or nystagmus noted. Normal smooth pursuit is noted. Hearing is grossly intact. Tympanic membranes are clear bilaterally. Face is symmetric with normal facial animation and normal facial sensation. Speech is clear with no dysarthria noted. There is no hypophonia. There is no lip, neck/head, jaw or voice tremor. Neck is supple with full range of passive and active motion. There are no carotid bruits on auscultation. Oropharynx exam reveals: mild mouth dryness, adequate dental hygiene and moderate airway crowding, due to larger uvula and thicker soft palate. Mallampati is class II. Tongue protrudes centrally and palate elevates symmetrically. Tonsils are absent. Neck size is 16 7/8 inches.   Chest: Clear to auscultation without wheezing, rhonchi or crackles noted.  Heart: S1+S2+0, regular and normal without murmurs, rubs or gallops noted.   Abdomen: Soft, non-tender and  non-distended with normal bowel sounds appreciated on auscultation.  Extremities: There is no pitting edema in the distal lower extremities bilaterally. Pedal pulses are intact.  Skin: Warm and dry without trophic changes noted. There are no varicose veins.  Musculoskeletal: exam reveals no obvious joint deformities, tenderness or joint swelling or erythema.   Neurologically:  Mental status: The patient is awake, alert and oriented in all 4 spheres. Her immediate and remote memory, attention, language skills and fund of knowledge are appropriate. There is no evidence of aphasia, agnosia, apraxia or anomia. Speech is clear with normal prosody and enunciation. Thought process is linear. Mood is normal and affect is normal.  Cranial nerves II - XII are as described above under HEENT exam. In addition: shoulder shrug is normal with equal shoulder height noted. Motor exam: Normal bulk, strength and tone is noted. There is no drift, tremor or rebound. Romberg is negative. Reflexes are 2+ throughout. Babinski: Toes are flexor bilaterally. Fine motor skills and coordination: intact with normal finger taps, normal hand movements, normal rapid alternating patting, normal foot taps and normal foot agility.  Cerebellar testing: No dysmetria or intention tremor on finger to nose testing. Heel to shin is unremarkable bilaterally. There is no truncal or gait ataxia.  Sensory exam: intact to light touch, pinprick, vibration, temperature sense and proprioception in the upper and lower extremities.  Gait, station and balance: She stands easily. No veering to one side is noted. No leaning to one side is noted. Posture is age-appropriate and stance is narrow based. Gait shows normal stride length and normal pace. No problems turning are noted. She turns en bloc. Tandem walk is unremarkable. Intact toe and heel stance is noted.               Assessment and Plan:   In summary, [name] Glassman Bleicher is a very pleasant 38  y.o.-year old female with a history of severe OSA, who presents for re-evaluation and treatment. She was diagnosed during a split-night sleep study in May 2011. She was placed on CPAP at 17 cm via full face mask. Unfortunately, she has been without treatment for about a year due to relapse in her addiction. Her machine broke as well. She has gained about 15 pounds since her last sleep study. I had a long chat with the  patient about the diagnosis of OSA, its prognosis and treatment options. We talked about medical treatments and non-pharmacological approaches. I explained in particular the risks and ramifications of untreated moderate to severe OSA, especially with respect to developing cardiovascular disease down the Road, including congestive heart failure, difficult to treat hypertension, cardiac arrhythmias, or stroke. Even type 2 diabetes has in part been linked to untreated OSA. We talked about trying to maintain a healthy lifestyle in general, as well as the importance of weight control. I encouraged the patient to eat healthy, exercise daily and keep well hydrated, to keep a scheduled bedtime and wake time routine, to not skip any meals and eat healthy snacks in between meals.  I recommended the following at this time: sleep study with potential positive airway pressure titration. We will try to get her in ASAP. In the interim, since we know she has severe sleep apnea I would like to see if we can get her on a loader CPAP machine with empiric treatment at 17 cm via full face mask. I wonder if she may require supplemental oxygen down the Road. I explained the sleep test procedure to the patient and also outlined possible surgical and non-surgical treatment options of OSA, including the use of a custom-made dental device, upper airway surgical options, such as pillar implants, radiofrequency surgery, tongue base surgery, and UPPP. I also explained the CPAP treatment option to the patient, who indicated that  she would be willing to try CPAP again and is quite motivated to be on treatment. She has associated severe insomnia as well. She has tried multiple prescription and nonprescription medications. At this juncture, I will not prescribe a sleep aid until we have had a sleep study. Nevertheless, treatment for insomnia may not necessarily be needed once she is on CPAP. Given her medical history she is not a candidate for Ambien, Lunesta, or benzodiazepines. Treatment options will be limited. She may have to explore nonpharmacological treatment such as cognitive behavioral therapy through a psychologist. I explained the importance of being compliant with PAP treatment, not only for insurance purposes but primarily to improve Her symptoms, and for the patient's long term health benefit, including to reduce Her cardiovascular risks. I answered all  her questions today and the patient was in agreement. I would like to see  her back after the sleep study is completed and encouraged  her to call with any interim questions, concerns, problems or updates. Thank you very much for allowing me to participate in the care of this nice patient. If I can be of any further assistance to you please do not hesitate to call me at [phone].  Sincerely,   Star Age, MD, PhD

## 2013-10-10 ENCOUNTER — Ambulatory Visit (INDEPENDENT_AMBULATORY_CARE_PROVIDER_SITE_OTHER): Payer: Medicare Other

## 2013-10-10 DIAGNOSIS — G4733 Obstructive sleep apnea (adult) (pediatric): Secondary | ICD-10-CM

## 2013-10-10 DIAGNOSIS — G4761 Periodic limb movement disorder: Secondary | ICD-10-CM

## 2013-10-10 DIAGNOSIS — G4730 Sleep apnea, unspecified: Secondary | ICD-10-CM

## 2013-10-10 DIAGNOSIS — F411 Generalized anxiety disorder: Secondary | ICD-10-CM

## 2013-10-10 DIAGNOSIS — F1910 Other psychoactive substance abuse, uncomplicated: Secondary | ICD-10-CM

## 2013-10-10 DIAGNOSIS — G4710 Hypersomnia, unspecified: Secondary | ICD-10-CM

## 2013-10-11 ENCOUNTER — Telehealth: Payer: Self-pay | Admitting: Neurology

## 2013-10-11 ENCOUNTER — Encounter: Payer: Self-pay | Admitting: *Deleted

## 2013-10-11 DIAGNOSIS — G4733 Obstructive sleep apnea (adult) (pediatric): Secondary | ICD-10-CM

## 2013-10-11 DIAGNOSIS — G4761 Periodic limb movement disorder: Secondary | ICD-10-CM

## 2013-10-11 LAB — WOUND CULTURE
GRAM STAIN: NONE SEEN
GRAM STAIN: NONE SEEN
Gram Stain: NONE SEEN

## 2013-10-11 NOTE — Telephone Encounter (Signed)
I called and spoke with the patient about her recent sleep study results. I informed the patient that the study confirmed the diagnosis of obstructive sleep apnea. I have sent her CPAP order to [facility]. I will fax a copy of the report to Dr. Pauletta Browns office and mail a copy to the patient along with a follow up instruction letter.

## 2013-10-11 NOTE — Sleep Study (Signed)
See media tab for full report  

## 2013-10-11 NOTE — Telephone Encounter (Signed)
Please call and notify patient that the recent sleep study confirmed the diagnosis of OSA. She did well with CPAP during the study with significant improvement of the respiratory events. Therefore, I would like start the patient on CPAP at home. I placed the order in the chart.   Arrange for CPAP set up at home through a DME company of patient's choice and fax/route report to PCP and referring MD (if other than PCP).   The patient will also need a follow up appointment with me in 6-8 weeks post set up that has to be scheduled; help the patient schedule this (in a follow-up slot).   Please re-enforce the importance of compliance with treatment and the need for [place] to monitor compliance data.   Once you have spoken to the patient and scheduled the return appointment, you may close this encounter, thanks,   Star Age, MD, PhD [facility] ([place])

## 2013-10-12 ENCOUNTER — Telehealth: Payer: Self-pay | Admitting: Internal Medicine

## 2013-10-12 NOTE — Telephone Encounter (Signed)
Patient would like a phone call back regarding results from blood work done on 10/08/13

## 2013-10-13 NOTE — Telephone Encounter (Signed)
Not done by me

## 2013-10-29 ENCOUNTER — Emergency Department (HOSPITAL_COMMUNITY)
Admission: EM | Admit: 2013-10-29 | Discharge: 2013-10-29 | Disposition: A | Discharge status: Home / Self Care | Payer: Medicare Other | Attending: Emergency Medicine | Admitting: Emergency Medicine

## 2013-10-29 ENCOUNTER — Encounter (HOSPITAL_COMMUNITY): Payer: Self-pay | Admitting: Emergency Medicine

## 2013-10-29 DIAGNOSIS — R42 Dizziness and giddiness: Secondary | ICD-10-CM | POA: Insufficient documentation

## 2013-10-29 DIAGNOSIS — R609 Edema, unspecified: Secondary | ICD-10-CM

## 2013-10-29 DIAGNOSIS — M545 Low back pain, unspecified: Secondary | ICD-10-CM | POA: Insufficient documentation

## 2013-10-29 DIAGNOSIS — Z8544 Personal history of malignant neoplasm of other female genital organs: Secondary | ICD-10-CM | POA: Insufficient documentation

## 2013-10-29 DIAGNOSIS — I998 Other disorder of circulatory system: Secondary | ICD-10-CM | POA: Insufficient documentation

## 2013-10-29 DIAGNOSIS — Z87891 Personal history of nicotine dependence: Secondary | ICD-10-CM | POA: Insufficient documentation

## 2013-10-29 DIAGNOSIS — T148XXA Other injury of unspecified body region, initial encounter: Secondary | ICD-10-CM

## 2013-10-29 DIAGNOSIS — Z8669 Personal history of other diseases of the nervous system and sense organs: Secondary | ICD-10-CM | POA: Insufficient documentation

## 2013-10-29 DIAGNOSIS — G4730 Sleep apnea, unspecified: Secondary | ICD-10-CM | POA: Insufficient documentation

## 2013-10-29 DIAGNOSIS — R0602 Shortness of breath: Secondary | ICD-10-CM | POA: Insufficient documentation

## 2013-10-29 DIAGNOSIS — Z9981 Dependence on supplemental oxygen: Secondary | ICD-10-CM | POA: Insufficient documentation

## 2013-10-29 DIAGNOSIS — Z880 Allergy status to penicillin: Secondary | ICD-10-CM | POA: Insufficient documentation

## 2013-10-29 DIAGNOSIS — Z8742 Personal history of other diseases of the female genital tract: Secondary | ICD-10-CM | POA: Insufficient documentation

## 2013-10-29 DIAGNOSIS — Z79899 Other long term (current) drug therapy: Secondary | ICD-10-CM | POA: Insufficient documentation

## 2013-10-29 DIAGNOSIS — Z8719 Personal history of other diseases of the digestive system: Secondary | ICD-10-CM | POA: Insufficient documentation

## 2013-10-29 DIAGNOSIS — R0789 Other chest pain: Secondary | ICD-10-CM | POA: Insufficient documentation

## 2013-10-29 DIAGNOSIS — R112 Nausea with vomiting, unspecified: Secondary | ICD-10-CM | POA: Insufficient documentation

## 2013-10-29 DIAGNOSIS — M549 Dorsalgia, unspecified: Secondary | ICD-10-CM

## 2013-10-29 DIAGNOSIS — Z862 Personal history of diseases of the blood and blood-forming organs and certain disorders involving the immune mechanism: Secondary | ICD-10-CM | POA: Insufficient documentation

## 2013-10-29 DIAGNOSIS — F411 Generalized anxiety disorder: Secondary | ICD-10-CM | POA: Insufficient documentation

## 2013-10-29 DIAGNOSIS — R197 Diarrhea, unspecified: Secondary | ICD-10-CM | POA: Insufficient documentation

## 2013-10-29 DIAGNOSIS — Z9889 Other specified postprocedural states: Secondary | ICD-10-CM | POA: Insufficient documentation

## 2013-10-29 DIAGNOSIS — M7989 Other specified soft tissue disorders: Secondary | ICD-10-CM | POA: Insufficient documentation

## 2013-10-29 DIAGNOSIS — E039 Hypothyroidism, unspecified: Secondary | ICD-10-CM | POA: Insufficient documentation

## 2013-10-29 DIAGNOSIS — G8929 Other chronic pain: Secondary | ICD-10-CM | POA: Insufficient documentation

## 2013-10-29 DIAGNOSIS — Z8739 Personal history of other diseases of the musculoskeletal system and connective tissue: Secondary | ICD-10-CM | POA: Insufficient documentation

## 2013-10-29 DIAGNOSIS — IMO0002 Reserved for concepts with insufficient information to code with codable children: Secondary | ICD-10-CM | POA: Insufficient documentation

## 2013-10-29 DIAGNOSIS — Z3202 Encounter for pregnancy test, result negative: Secondary | ICD-10-CM | POA: Insufficient documentation

## 2013-10-29 DIAGNOSIS — R52 Pain, unspecified: Secondary | ICD-10-CM | POA: Insufficient documentation

## 2013-10-29 LAB — CBC WITH DIFFERENTIAL/PLATELET
BASOS ABS: 0 10*3/uL (ref 0.0–0.1)
Basophils Relative: 0 % (ref 0–1)
EOS PCT: 1 % (ref 0–5)
Eosinophils Absolute: 0.1 10*3/uL (ref 0.0–0.7)
HEMATOCRIT: 38.3 % (ref 36.0–46.0)
Hemoglobin: 13.2 g/dL (ref 12.0–15.0)
LYMPHS ABS: 1 10*3/uL (ref 0.7–4.0)
LYMPHS PCT: 10 % — AB (ref 12–46)
MCH: 31.9 pg (ref 26.0–34.0)
MCHC: 34.5 g/dL (ref 30.0–36.0)
MCV: 92.5 fL (ref 78.0–100.0)
Monocytes Absolute: 0.7 10*3/uL (ref 0.1–1.0)
Monocytes Relative: 7 % (ref 3–12)
NEUTROS ABS: 7.9 10*3/uL — AB (ref 1.7–7.7)
Neutrophils Relative %: 82 % — ABNORMAL HIGH (ref 43–77)
Platelets: 184 10*3/uL (ref 150–400)
RBC: 4.14 MIL/uL (ref 3.87–5.11)
RDW: 13.1 % (ref 11.5–15.5)
WBC: 9.7 10*3/uL (ref 4.0–10.5)

## 2013-10-29 LAB — COMPREHENSIVE METABOLIC PANEL
ALT: 21 U/L (ref 0–35)
AST: 18 U/L (ref 0–37)
Albumin: 3.8 g/dL (ref 3.5–5.2)
Alkaline Phosphatase: 58 U/L (ref 39–117)
BILIRUBIN TOTAL: 0.2 mg/dL — AB (ref 0.3–1.2)
BUN: 15 mg/dL (ref 6–23)
CALCIUM: 9.3 mg/dL (ref 8.4–10.5)
CHLORIDE: 101 meq/L (ref 96–112)
CO2: 24 meq/L (ref 19–32)
Creatinine, Ser: 0.91 mg/dL (ref 0.50–1.10)
GFR, EST NON AFRICAN AMERICAN: 78 mL/min — AB (ref >90)
GLUCOSE: 100 mg/dL — AB (ref 70–99)
Potassium: 4.3 mEq/L (ref 3.7–5.3)
Sodium: 138 mEq/L (ref 137–147)
Total Protein: 7 g/dL (ref 6.0–8.3)

## 2013-10-29 LAB — PROTIME-INR
INR: 0.94 (ref 0.00–1.49)
Prothrombin Time: 12.4 seconds (ref 11.6–15.2)

## 2013-10-29 LAB — URINALYSIS, ROUTINE W REFLEX MICROSCOPIC
Bilirubin Urine: NEGATIVE
Glucose, UA: NEGATIVE mg/dL
Hgb urine dipstick: NEGATIVE
Ketones, ur: NEGATIVE mg/dL
Leukocytes, UA: NEGATIVE
NITRITE: NEGATIVE
PH: 6 (ref 5.0–8.0)
Protein, ur: NEGATIVE mg/dL
SPECIFIC GRAVITY, URINE: 1.038 — AB (ref 1.005–1.030)
Urobilinogen, UA: 0.2 mg/dL (ref 0.0–1.0)

## 2013-10-29 LAB — APTT: APTT: 28 s (ref 24–37)

## 2013-10-29 LAB — D-DIMER, QUANTITATIVE: D-Dimer, Quant: <0.27 ug/mL-FEU (ref 0.00–0.48)

## 2013-10-29 LAB — POC URINE PREG, ED: PREG TEST UR: NEGATIVE

## 2013-10-29 LAB — I-STAT TROPONIN, ED: Troponin i, poc: 0 ng/mL (ref 0.00–0.08)

## 2013-10-29 LAB — LIPASE, BLOOD: LIPASE: 23 U/L (ref 11–59)

## 2013-10-29 MED ORDER — TRAMADOL HCL 50 MG PO TABS: 50.0000 mg | ORAL_TABLET | Freq: Four times a day (QID) | ORAL | Status: DC | PRN

## 2013-10-29 MED ORDER — NAPROXEN 500 MG PO TABS: 500.0000 mg | ORAL_TABLET | Freq: Two times a day (BID) | ORAL | Status: DC

## 2013-10-29 MED ORDER — ONDANSETRON 8 MG PO TBDP
8.0000 mg | ORAL_TABLET | Freq: Once | ORAL | Status: AC
Start: 2013-10-29 — End: 2013-10-29
  Administered 2013-10-29: 8 mg via ORAL
  Filled 2013-10-29: qty 1

## 2013-10-29 MED ORDER — ONDANSETRON 8 MG PO TBDP: 8.0000 mg | ORAL_TABLET | Freq: Three times a day (TID) | ORAL | Status: DC | PRN

## 2013-10-29 MED ORDER — TRAMADOL HCL 50 MG PO TABS: 100.0000 mg | ORAL_TABLET | Freq: Four times a day (QID) | ORAL | Status: DC | PRN

## 2013-10-29 MED ORDER — TRAMADOL HCL 50 MG PO TABS
100.0000 mg | ORAL_TABLET | Freq: Once | ORAL | Status: AC
  Administered 2013-10-29: 100 mg via ORAL
  Filled 2013-10-29: qty 2

## 2013-10-29 NOTE — Discharge Instructions (Signed)
Naprosyn for pain and inflammation. Ultram for severe pain. Zofran for nausea. Your blood work today is normal. Urine analysis normal. Follow up with primary care doctor for recheck.    Dizziness Dizziness is a common problem. It is a feeling of unsteadiness or lightheadedness. You may feel like you are about to faint. Dizziness can lead to injury if you stumble or fall. A person of any age group can suffer from dizziness, but dizziness is more common in older adults. CAUSES  Dizziness can be caused by many different things, including:  Middle ear problems.  Standing for too long.  Infections.  An allergic reaction.  Aging.  An emotional response to something, such as the sight of blood.  Side effects of medicines.  Fatigue.  Problems with circulation or blood pressure.  Excess use of alcohol, medicines, or illegal drug use.  Breathing too fast (hyperventilation).  An arrhythmia or problems with your heart rhythm.  Low red blood cell count (anemia).  Pregnancy.  Vomiting, diarrhea, fever, or other illnesses that cause dehydration.  Diseases or conditions such as Parkinson's disease, high blood pressure (hypertension), diabetes, and thyroid problems.  Exposure to extreme heat. DIAGNOSIS  To find the cause of your dizziness, your caregiver may do a physical exam, lab tests, radiologic imaging scans, or an electrocardiography test (ECG).  TREATMENT  Treatment of dizziness depends on the cause of your symptoms and can vary greatly. HOME CARE INSTRUCTIONS   Drink enough fluids to keep your urine clear or pale yellow. This is especially important in very hot weather. In the elderly, it is also important in cold weather.  If your dizziness is caused by medicines, take them exactly as directed. When taking blood pressure medicines, it is especially important to get up slowly.  Rise slowly from chairs and steady yourself until you feel okay.  In the morning, first sit up  on the side of the bed. When this seems okay, stand slowly while holding onto something until you know your balance is fine.  If you need to stand in one place for a long time, be sure to move your legs often. Tighten and relax the muscles in your legs while standing.  If dizziness continues to be a problem, have someone stay with you for a [place] or two. Do this until you feel you are well enough to stay alone. Have the person call your caregiver if he or she notices changes in you that are concerning.  Do not drive or use heavy machinery if you feel dizzy.  Do not drink alcohol. SEEK IMMEDIATE MEDICAL CARE IF:   Your dizziness or lightheadedness gets worse.  You feel nauseous or vomit.  You develop problems with talking, walking, weakness, or using your arms, hands, or legs.  You are not thinking clearly or you have difficulty forming sentences. It may take a friend or family member to determine if your thinking is normal.  You develop chest pain, abdominal pain, shortness of breath, or sweating.  Your vision changes.  You notice any bleeding.  You have side effects from medicine that seems to be getting worse rather than better. MAKE SURE YOU:   Understand these instructions.  Will watch your condition.  Will get help right away if you are not doing well or get worse. Document Released: 01/06/2001 Document Revised: 10/05/2011 Document Reviewed: 01/30/2011 [facility] Patient Information 2014 [place], [place].

## 2013-10-29 NOTE — ED Notes (Addendum)
Pt c/o bilateral low back pain x 1 week, nausea, diarrhea, L arm swelling, and generalized bruising x 2 days, "knots" on anterior and posterior of R calf starting this morning, and near syncope x 1 hour ago.  Pain score 5/10.  Hx of blood clot from "a radial cath" and chronic back pain.

## 2013-10-29 NOTE — ED Notes (Signed)
PA at bedside.

## 2013-10-29 NOTE — ED Provider Notes (Signed)
CSN: [id number]     Arrival date & time 10/29/13  1524 History   First MD Initiated Contact with Patient 10/29/13 1528     Chief Complaint  Patient presents with  . Back Pain  . Nausea  . Diarrhea     (Consider location/radiation/quality/duration/timing/severity/associated sxs/prior Treatment) HPI [name] is a 38 y.o. female who presents to the ED with multiple complaints. Pt states she is having nausea and diarrhea for 1 week, lower back pain, acute on chronic, left arm swelling, bruising to the right lower leg and left thigh, chest pain and shortness of breath on exertion which has been going on for a while, and has apt scheduled with cardiology. States hx of superficial thrombophlebitis after an arterial line placement years ago, not on blood thinners. Reports lower back pain, no recent injuries. Denies pain radiation. States hx of 6 back surgeries. No loss of bowel or urinary retention or incontinence. Has been applying ice to the area. States bruising to both legs started few days ago. States they appear more swollen. States no injuries. States today was at [employer] and became lightheaded and felt like she is going to [employer], so came to ED. Pt denies any fevers. No active chest pain or sob. No abdominal pain. No urinary symptoms. No vomiting. Did not take any medications prior to coming in. Denies active dizziness.   Past Medical History  Diagnosis Date  . Narcotic abuse in remission   . Thyroid disease   . Chronic back pain   . Hypothyroidism   . Anxiety   . GERD (gastroesophageal reflux disease)   . Sleep apnea     cpap  . Headache(784.0)   . Neuromuscular disorder     neuropathy  . Arthritis     knees bilateral   DJD  stenosis   . Blood dyscrasia     HSV  . Cancer     vaginal  malignant carcinoma  . PCOS (polycystic ovarian syndrome)    Past Surgical History  Procedure Laterality Date  . Back surgery    . Cesarean section    . Tonsillectomy    . Tubal  ligation    . Fracture surgery     Family History  Problem Relation Age of Onset  . Arthritis Mother   . Clotting disorder Mother   . Hyperlipidemia Father   . Leukemia Maternal Grandmother   . Diabetes Maternal Grandfather   . Stroke Paternal Grandmother   . Heart disease Father   . Diabetes Mother   . Hypertension Mother   . Prostate cancer Father   . Heart disease Paternal Grandmother    History  Substance Use Topics  . Smoking status: Former Smoker -- 0.50 packs/Hamid for 20 years    Types: Cigarettes  . Smokeless tobacco: Never Used  . Alcohol Use: No     Comment: heavy   OB History   Grav Para Term Preterm Abortions TAB SAB Ect Mult Living                 Review of Systems  Constitutional: Negative for fever and chills.  HENT: Negative for congestion.   Respiratory: Positive for chest tightness. Negative for cough and shortness of breath.   Cardiovascular: Positive for chest pain and leg swelling. Negative for palpitations.  Gastrointestinal: Positive for nausea and diarrhea. Negative for vomiting and abdominal pain.  Genitourinary: Negative for dysuria, flank pain, vaginal bleeding, vaginal discharge, vaginal pain and pelvic pain.  Musculoskeletal: Positive  for arthralgias and back pain. Negative for gait problem, neck pain and neck stiffness.  Skin: Negative for rash.  Neurological: Positive for dizziness and light-headedness. Negative for weakness, numbness and headaches.  Hematological: Bruises/bleeds easily.  All other systems reviewed and are negative.      Allergies  Avelox; Levofloxacin; Penicillins; and Quinolones  Home Medications   Current Outpatient Rx  Name  Route  Sig  Dispense  Refill  . gabapentin (NEURONTIN) 800 MG tablet   Oral   Take 1 tablet (800 mg total) by mouth 2 (two) times daily.   60 tablet   3   . ibuprofen (ADVIL,MOTRIN) 200 MG tablet   Oral   Take 400 mg by mouth every 6 (six) hours as needed for moderate pain.           Marland Kitchen levothyroxine (SYNTHROID, LEVOTHROID) 50 MCG tablet   Oral   Take 1 tablet (50 mcg total) by mouth daily before breakfast.   30 tablet   3   . mometasone (NASONEX) 50 MCG/ACT nasal spray   Nasal   Place 2 sprays into the nose daily.   17 g   3   . Multiple Vitamin (MULTIVITAMIN WITH MINERALS) TABS tablet   Oral   Take 1 tablet by mouth daily.         Marland Kitchen PARoxetine (PAXIL) 40 MG tablet   Oral   Take 1 tablet (40 mg total) by mouth every morning.   30 tablet   3    BP 144/94  Pulse 117  Temp(Src) 98.7 F (37.1 C) (Oral)  Resp 18  SpO2 98%  LMP 10/08/2013 Physical Exam  Nursing note and vitals reviewed. Constitutional: She is oriented to person, place, and time. She appears well-developed and well-nourished. No distress.  HENT:  Head: Normocephalic.  Eyes: Conjunctivae are normal.  Neck: Neck supple.  Cardiovascular: Normal rate, regular rhythm and normal heart sounds.   Pulmonary/Chest: Effort normal and breath sounds normal. No respiratory distress. She has no wheezes. She has no rales.  Abdominal: Soft. Bowel sounds are normal. She exhibits no distension. There is no tenderness. There is no rebound.  Musculoskeletal: She exhibits no edema.  Paravertebral lumbar spine tenderness. No midline tenderness. Full tom of bilaterally LE, no pain with straight leg raise bilaterally. Small 3x3cm contusion to the left anterior thigh. Extensive bruise to almost entire anterior right shin. Non tender. Trace pitting edema bilaterally mid-shin down. Dorsal pedal pulses intact  Neurological: She is alert and oriented to person, place, and time. No cranial nerve deficit.  Skin: Skin is warm and dry.  Psychiatric: She has a normal mood and affect. Her behavior is normal.    ED Course  Procedures (including critical care time) Labs Review Labs Reviewed  CBC WITH DIFFERENTIAL - Abnormal; Notable for the following:    Neutrophils Relative % 82 (*)    Neutro Abs 7.9 (*)     Lymphocytes Relative 10 (*)    All other components within normal limits  COMPREHENSIVE METABOLIC PANEL - Abnormal; Notable for the following:    Glucose, Bld 100 (*)    Total Bilirubin 0.2 (*)    GFR calc non Af Amer 78 (*)    All other components within normal limits  URINALYSIS, ROUTINE W REFLEX MICROSCOPIC - Abnormal; Notable for the following:    APPearance CLOUDY (*)    Specific Gravity, Urine 1.038 (*)    All other components within normal limits  D-DIMER, QUANTITATIVE  PROTIME-INR  APTT  LIPASE, BLOOD  I-STAT TROPOININ, ED  POC URINE PREG, ED   Imaging Review No results found.   EKG Interpretation None      MDM   Final diagnoses:  Back pain  Dizzy  Swelling  Bruising  Nausea & vomiting      5:40 PM Who presents to ED with multiple complaints. Pt is non toxic appearing on exam. Given her SOB, near syncope today, swelling and bruising of extremities, labs ordered, which included CBC, CMP, d dimer, PT, PTT, troponin. All negative. Her UA is concentrated otherwise unremarkable. Pt is mildly orthostatic. Possible mild dehydration. She is not vomiting. Tolerating PO fluids. Encourage oral rehydration at home. At this time stable for discharge. Will give zofran and ultram for her symptoms. Follow up with PCP.   Filed Vitals:   10/29/13 1538 10/29/13 1630 10/29/13 1631 10/29/13 1632  BP: 144/94 134/80 121/75 115/74  Pulse: 117 97 102 110  Temp: 98.7 F (37.1 C)     TempSrc: Oral     Resp: 18     SpO2: 98%        Edwyna Dangerfield A Antinette Keough, PA-C 10/29/13 2329

## 2013-10-29 NOTE — ED Notes (Addendum)
Pt presents with multiple complaints: Back pain 8/10 radiating bilaterally to the flank, reports multiple (6) of back surgery and chronic pain, Nausea and diarrhea, denies emesis, Left hand mild swelling Bruising on left thigh and left ankle, denies being on blood thiners.  Chest pain with exertion, and a recent near syncope episode denies shortness of breath. Pt in NAD.

## 2013-10-30 NOTE — ED Provider Notes (Signed)
Medical screening examination/treatment/procedure(s) were performed by non-physician practitioner and as supervising physician I was immediately available for consultation/collaboration.   EKG Interpretation   Date/Time:  Sunday October 29 2013 16:29:37 EDT Ventricular Rate:  95 PR Interval:  119 QRS Duration: 88 QT Interval:  338 QTC Calculation: 425 R Axis:   37 Text Interpretation:  Sinus rhythm Borderline short PR interval Low  voltage, precordial leads No significant change since last tracing  Confirmed by Aline Brochure  MD, Gwendlyon Zumbro ([id number]) on 10/29/2013 4:37:58 PM        Shea Evans Ruthe Mannan, MD 10/30/13 [phone]

## 2013-11-07 ENCOUNTER — Encounter: Payer: Self-pay | Admitting: *Deleted

## 2013-11-07 DIAGNOSIS — G4733 Obstructive sleep apnea (adult) (pediatric): Secondary | ICD-10-CM

## 2013-11-07 NOTE — Assessment & Plan Note (Signed)
Study Scoring Technique: Obstructive hypopneas are scored as at least 30% fall in amplitude, PTAF compared to pre-event baseline excursions for at least 10 seconds followed by an arousal, awakening or at least 4% desaturation(per Medicare guidelines) or 3% desaturation (per AASM criteria for all other insurances).  The fall in signal amplitude should at last for at least  90% of the entire respiratory event. Central apneas not occurring post arousal can be scored if no effort and no airflow are present for at least 10 seconds. Central apneas occurring post-arousal are scored in no airflow and no effort are present for at least 10 seconds duration or if no airflow and no effort are present for at least 10 seconds and are followed by an arousal, awakening or at least 3% desaturation. The panea index (Al) reflects the number of apneas per hour of total sleep time(TST). The hypopnea index (HI) is the number of hypopneas per hour of TST. The apnea hypopnea index (AH) reflects the number of apneas and hypopneas per hour of TST.

## 2013-11-17 ENCOUNTER — Encounter: Payer: Self-pay | Admitting: Neurology

## 2013-11-24 ENCOUNTER — Ambulatory Visit: Payer: Medicare Other | Admitting: Neurology

## 2013-11-29 ENCOUNTER — Encounter: Payer: Self-pay | Admitting: Internal Medicine

## 2013-11-30 NOTE — Progress Notes (Signed)
Quick Note:  I reviewed the patient's CPAP compliance data from 10/17/2013 to 11/15/2013, which is a total of 30 days, during which time the patient used CPAP every [place] except for 5 days. The average usage for all days was 4 hours and 0 minutes. The percent used days greater than 4 hours was only 57 %, indicating suboptimal compliance. The residual AHI was 4.4 per hour, indicating an adequate treatment pressure of 12 cwp with EPR of 1. Air leak from the mask was acceptable at 20.4 L per minute at the 95th percentile. I will review this data with the patient at the next office visit, which is currently not scheduled. We need to get in touch with the patient regarding making an appointment for followup and also remind her to not skip any days and try to use CPAP throughout the night.  Star Age, MD, PhD [facility] ([facility])   ______

## 2013-12-04 NOTE — Progress Notes (Signed)
This encounter was created in error - please disregard.

## 2013-12-07 ENCOUNTER — Ambulatory Visit (INDEPENDENT_AMBULATORY_CARE_PROVIDER_SITE_OTHER): Payer: Medicare Other | Admitting: Family Medicine

## 2013-12-07 VITALS — BP 134/80 | HR 90 | Temp 98.7°F | Resp 17 | Ht 65.5 in | Wt 316.0 lb

## 2013-12-07 DIAGNOSIS — R05 Cough: Secondary | ICD-10-CM

## 2013-12-07 DIAGNOSIS — R059 Cough, unspecified: Secondary | ICD-10-CM

## 2013-12-07 DIAGNOSIS — J029 Acute pharyngitis, unspecified: Secondary | ICD-10-CM

## 2013-12-07 LAB — POCT RAPID STREP A (OFFICE): RAPID STREP A SCREEN: NEGATIVE

## 2013-12-07 MED ORDER — AZITHROMYCIN 250 MG PO TABS: ORAL_TABLET | ORAL | Status: DC

## 2013-12-07 MED ORDER — IBUPROFEN 800 MG PO TABS: 800.0000 mg | ORAL_TABLET | Freq: Three times a day (TID) | ORAL | Status: DC | PRN

## 2013-12-07 NOTE — Patient Instructions (Addendum)
Continue to use ibuprofen as needed.  I will be in touch with your throat culture as soon as they come back.  Use azithromycin for your cough as directed.   Let me know if you are feeling worse or not getting better soon!

## 2013-12-07 NOTE — Progress Notes (Signed)
[facility] [address], [place] [address] [phone]  Date:  12/07/2013   Name:  [name]   DOB:  03-12-1976   MRN:  [id number]  PCP:  Leandrew Koyanagi, MD    Chief Complaint: Cough, URI, Sore Throat and Fatigue   History of Present Illness:  [name] Glassman Spath is a 38 y.o. very pleasant female patient who presents with the following:  She is here today with illness.  She noted a cough with "a really bad taste" a couple of weeks ago.  She has noted a sore and enlarged uvula.  She now has a ST as well, and wants to be sure she does not have strep. In addition she continues to cough She lives in a recovery house called [facility]" and they use [employer].  She is in recovery for narcotics, had a kidnergarten aged daughter.  She is doing very well  She has noted a low grade fever to about 101.  She is still coughing.  She has noted some aches and chills.   She did not take any antipyretics this am- she has used ibuprofen at times.  She would like an rx for ibuprofen 800 mg if possible; she uses this at times as needed She does note a runny nose and sneezing. She is using mucinex, nasonex and zyrtec.    Patient Active Problem List   Diagnosis Date Noted  . Herpes labialis 10/08/2013  . Lumbar radiculopathy, chronic 10/08/2013  . Nicotine addiction 07/30/2012  . Fall down stairs 05/27/2012  . Foot fracture 05/27/2012  . Obesity, Class III, BMI 40-49.9 (morbid obesity) 10/31/2011  . Mood disorder 10/31/2011  . Hypothyroidism 10/31/2011  . Obstructive sleep apnea 10/31/2011  . Narcotic abuse in remission   . Chronic back pain     Past Medical History  Diagnosis Date  . Narcotic abuse in remission   . Thyroid disease   . Chronic back pain   . Hypothyroidism   . Anxiety   . GERD (gastroesophageal reflux disease)   . Sleep apnea     cpap  . Headache(784.0)   . Neuromuscular disorder     neuropathy  . Arthritis     knees bilateral    DJD  stenosis   . Blood dyscrasia     HSV  . Cancer     vaginal  malignant carcinoma  . PCOS (polycystic ovarian syndrome)     Past Surgical History  Procedure Laterality Date  . Back surgery    . Cesarean section    . Tonsillectomy    . Tubal ligation    . Fracture surgery      History  Substance Use Topics  . Smoking status: Former Smoker -- 0.50 packs/[place]on for 20 years    Types: Cigarettes  . Smokeless tobacco: Never Used  . Alcohol Use: No     Comment: heavy    Family History  Problem Relation Age of Onset  . Arthritis Mother   . Clotting disorder Mother   . Hyperlipidemia Father   . Leukemia Maternal Grandmother   . Diabetes Maternal Grandfather   . Stroke Paternal Grandmother   . Heart disease Father   . Diabetes Mother   . Hypertension Mother   . Prostate cancer Father   . Heart disease Paternal Grandmother     Allergies  Allergen Reactions  . Avelox [Moxifloxacin Hcl In Nacl] Hives  . Levofloxacin Hives  . Penicillins Hives  .  Quinolones Hives    Medication list has been reviewed and updated.  Current Outpatient Prescriptions on File Prior to Visit  Medication Sig Dispense Refill  . gabapentin (NEURONTIN) 800 MG tablet Take 1 tablet (800 mg total) by mouth 2 (two) times daily.  60 tablet  3  . ibuprofen (ADVIL,MOTRIN) 200 MG tablet Take 400 mg by mouth every 6 (six) hours as needed for moderate pain.       Marland Kitchen levothyroxine (SYNTHROID, LEVOTHROID) 50 MCG tablet Take 1 tablet (50 mcg total) by mouth daily before breakfast.  30 tablet  3  . mometasone (NASONEX) 50 MCG/ACT nasal spray Place 2 sprays into the nose daily.  17 g  3  . Multiple Vitamin (MULTIVITAMIN WITH MINERALS) TABS tablet Take 1 tablet by mouth daily.       Current Facility-Administered Medications on File Prior to Visit  Medication Dose Route Frequency Provider Last Rate Last Dose  . influenza vac split quadrivalent PF (FLUARIX) injection 0.5 mL  0.5 mL Intramuscular Tomorrow-1000 Robyn Haber, MD        Review of Systems:  As per HPI- otherwise negative.   Physical Examination: Filed Vitals:   12/07/13 1039  BP: 134/80  Pulse: 109  Temp: 98.7 F (37.1 C)  Resp: 17   Filed Vitals:   12/07/13 1039  Height: 5' 5.5" (1.664 m)  Weight: 316 lb (143.337 kg)   Body mass index is 51.77 kg/(m^2). Ideal Body Weight: Weight in (lb) to have BMI = 25: 152.2  GEN: WDWN, NAD, Non-toxic, A & O x 3, obese, looks well HEENT: Atraumatic, Normocephalic. Neck supple. No masses, No LAD.  Bilateral TM wnl, oropharynx normal.  PEERL,EOMI.  S/p T and A, her uvula is inflamed but not swollen Ears and Nose: No external deformity. CV: RRR, No M/G/R. No JVD. No thrill. No extra heart sounds. PULM: CTA B, no wheezes, crackles, rhonchi. No retractions. No resp. distress. No accessory muscle use. ABD: S, NT, ND EXTR: No c/c/e NEURO Normal gait.  PSYCH: Normally interactive. Conversant. Not depressed or anxious appearing.  Calm demeanor.   Results for orders placed in visit on 12/07/13  POCT RAPID STREP A (OFFICE)      Result Value Ref Range   Rapid Strep A Screen Negative  Negative    Assessment and Plan: Pharyngitis - Plan: POCT rapid strep A, Culture, Group A Strep, ibuprofen (ADVIL,MOTRIN) 800 MG tablet, DISCONTINUED: ibuprofen (ADVIL,MOTRIN) 800 MG tablet  Cough - Plan: azithromycin (ZITHROMAX) 250 MG tablet, DISCONTINUED: azithromycin (ZITHROMAX) 250 MG tablet  Treat for bronchitis and pharyngitis with azithromycin, throat culture pending  Refilled her ibuprofen 800 for prn use Follow-up if not better- Sooner if worse.     Signed Lamar Blinks, MD

## 2013-12-08 ENCOUNTER — Encounter: Payer: Self-pay | Admitting: Neurology

## 2013-12-09 ENCOUNTER — Encounter: Payer: Self-pay | Admitting: Family Medicine

## 2013-12-09 LAB — CULTURE, GROUP A STREP: ORGANISM ID, BACTERIA: NORMAL

## 2013-12-11 ENCOUNTER — Telehealth: Payer: Self-pay

## 2013-12-11 DIAGNOSIS — R059 Cough, unspecified: Secondary | ICD-10-CM

## 2013-12-11 DIAGNOSIS — R05 Cough: Secondary | ICD-10-CM

## 2013-12-11 NOTE — Telephone Encounter (Signed)
Patient was seen on 12/07/2013 and states that she isn't feeling better  [phone]

## 2013-12-12 MED ORDER — PREDNISONE 20 MG PO TABS: ORAL_TABLET | ORAL | Status: DC

## 2013-12-12 NOTE — Telephone Encounter (Signed)
Called her back and LMOM.  This is ok, but do not take the ibuprofen while she is on the prednisone.  If worse, fever, etc please let me know

## 2013-12-12 NOTE — Telephone Encounter (Signed)
Dr. Lorelei Pont - pt requests steroids in addition to zpak - please advise

## 2013-12-12 NOTE — Telephone Encounter (Signed)
Pt states that her throat is still raw. Cough is still productive. She states prednisone is usually what it takes along with the zpack to clear her up completely. Can we call this in for her?

## 2013-12-18 ENCOUNTER — Encounter (HOSPITAL_COMMUNITY): Payer: Self-pay | Admitting: Emergency Medicine

## 2013-12-18 ENCOUNTER — Emergency Department (HOSPITAL_COMMUNITY): Payer: Medicare Other

## 2013-12-18 ENCOUNTER — Emergency Department (HOSPITAL_COMMUNITY)
Admission: EM | Admit: 2013-12-18 | Discharge: 2013-12-18 | Disposition: A | Discharge status: Home / Self Care | Payer: Medicare Other | Attending: Emergency Medicine | Admitting: Emergency Medicine

## 2013-12-18 DIAGNOSIS — Z8544 Personal history of malignant neoplasm of other female genital organs: Secondary | ICD-10-CM | POA: Insufficient documentation

## 2013-12-18 DIAGNOSIS — E039 Hypothyroidism, unspecified: Secondary | ICD-10-CM | POA: Insufficient documentation

## 2013-12-18 DIAGNOSIS — J029 Acute pharyngitis, unspecified: Secondary | ICD-10-CM | POA: Insufficient documentation

## 2013-12-18 DIAGNOSIS — Z87891 Personal history of nicotine dependence: Secondary | ICD-10-CM | POA: Insufficient documentation

## 2013-12-18 DIAGNOSIS — Z8742 Personal history of other diseases of the female genital tract: Secondary | ICD-10-CM | POA: Insufficient documentation

## 2013-12-18 DIAGNOSIS — Z880 Allergy status to penicillin: Secondary | ICD-10-CM | POA: Insufficient documentation

## 2013-12-18 DIAGNOSIS — Z9981 Dependence on supplemental oxygen: Secondary | ICD-10-CM | POA: Insufficient documentation

## 2013-12-18 DIAGNOSIS — IMO0002 Reserved for concepts with insufficient information to code with codable children: Secondary | ICD-10-CM | POA: Insufficient documentation

## 2013-12-18 DIAGNOSIS — G4730 Sleep apnea, unspecified: Secondary | ICD-10-CM | POA: Insufficient documentation

## 2013-12-18 DIAGNOSIS — Z862 Personal history of diseases of the blood and blood-forming organs and certain disorders involving the immune mechanism: Secondary | ICD-10-CM | POA: Insufficient documentation

## 2013-12-18 DIAGNOSIS — G8929 Other chronic pain: Secondary | ICD-10-CM | POA: Insufficient documentation

## 2013-12-18 DIAGNOSIS — Z79899 Other long term (current) drug therapy: Secondary | ICD-10-CM | POA: Insufficient documentation

## 2013-12-18 DIAGNOSIS — J209 Acute bronchitis, unspecified: Secondary | ICD-10-CM | POA: Insufficient documentation

## 2013-12-18 DIAGNOSIS — J40 Bronchitis, not specified as acute or chronic: Secondary | ICD-10-CM

## 2013-12-18 DIAGNOSIS — Z792 Long term (current) use of antibiotics: Secondary | ICD-10-CM | POA: Insufficient documentation

## 2013-12-18 DIAGNOSIS — M1710 Unilateral primary osteoarthritis, unspecified knee: Secondary | ICD-10-CM | POA: Insufficient documentation

## 2013-12-18 DIAGNOSIS — Z8669 Personal history of other diseases of the nervous system and sense organs: Secondary | ICD-10-CM | POA: Insufficient documentation

## 2013-12-18 DIAGNOSIS — Z8719 Personal history of other diseases of the digestive system: Secondary | ICD-10-CM | POA: Insufficient documentation

## 2013-12-18 DIAGNOSIS — F411 Generalized anxiety disorder: Secondary | ICD-10-CM | POA: Insufficient documentation

## 2013-12-18 LAB — CBC
HCT: 39.1 % (ref 36.0–46.0)
HEMOGLOBIN: 13.2 g/dL (ref 12.0–15.0)
MCH: 31.1 pg (ref 26.0–34.0)
MCHC: 33.8 g/dL (ref 30.0–36.0)
MCV: 92.2 fL (ref 78.0–100.0)
Platelets: 253 10*3/uL (ref 150–400)
RBC: 4.24 MIL/uL (ref 3.87–5.11)
RDW: 13 % (ref 11.5–15.5)
WBC: 10.3 10*3/uL (ref 4.0–10.5)

## 2013-12-18 LAB — RAPID STREP SCREEN (MED CTR MEBANE ONLY): Streptococcus, Group A Screen (Direct): NEGATIVE

## 2013-12-18 LAB — MONONUCLEOSIS SCREEN: Mono Screen: NEGATIVE

## 2013-12-18 MED ORDER — TRAMADOL HCL 50 MG PO TABS: 50.0000 mg | ORAL_TABLET | Freq: Four times a day (QID) | ORAL | Status: DC | PRN

## 2013-12-18 MED ORDER — DOXYCYCLINE HYCLATE 100 MG PO TABS: 100.0000 mg | ORAL_TABLET | Freq: Two times a day (BID) | ORAL | Status: DC

## 2013-12-18 MED ORDER — HYDROCODONE-ACETAMINOPHEN 5-325 MG PO TABS
2.0000 | ORAL_TABLET | Freq: Once | ORAL | Status: AC
  Administered 2013-12-18: 2 via ORAL
  Filled 2013-12-18: qty 2

## 2013-12-18 MED ORDER — DOXYCYCLINE HYCLATE 100 MG PO TABS
100.0000 mg | ORAL_TABLET | Freq: Once | ORAL | Status: AC
  Administered 2013-12-18: 100 mg via ORAL
  Filled 2013-12-18: qty 1

## 2013-12-18 NOTE — ED Provider Notes (Signed)
CSN: [id number]     Arrival date & time 12/18/13  0630 History   First MD Initiated Contact with Patient 12/18/13 [phone]     Chief Complaint  Patient presents with  . Cough  . Sore Throat  . Fever     (Consider location/radiation/quality/duration/timing/severity/associated sxs/prior Treatment) Patient is a 38 y.o. female presenting with cough, pharyngitis, and fever. The history is provided by the patient.  Cough Cough characteristics:  Productive Sputum characteristics:  Yellow Severity:  Mild Onset quality:  Gradual Duration:  3 weeks Timing:  Constant Progression:  Worsening Chronicity:  New Smoker: quit 9 months ago.   Context: sick contacts (daughter has cough)   Relieved by:  Nothing Worsened by:  Nothing tried Ineffective treatments:  Cough suppressants (PO steroid burst, PO Azithromycin course) Associated symptoms: sore throat   Associated symptoms: no chest pain, no ear pain, no fever and no shortness of breath   Sore Throat Pertinent negatives include no chest pain and no shortness of breath.  Fever Associated symptoms: cough and sore throat   Associated symptoms: no chest pain and no ear pain     Past Medical History  Diagnosis Date  . Narcotic abuse in remission   . Thyroid disease   . Chronic back pain   . Hypothyroidism   . Anxiety   . GERD (gastroesophageal reflux disease)   . Sleep apnea     cpap  . Headache(784.0)   . Neuromuscular disorder     neuropathy  . Arthritis     knees bilateral   DJD  stenosis   . Blood dyscrasia     HSV  . Cancer     vaginal  malignant carcinoma  . PCOS (polycystic ovarian syndrome)    Past Surgical History  Procedure Laterality Date  . Back surgery    . Cesarean section    . Tonsillectomy    . Tubal ligation    . Fracture surgery     Family History  Problem Relation Age of Onset  . Arthritis Mother   . Clotting disorder Mother   . Hyperlipidemia Father   . Leukemia Maternal Grandmother   . Diabetes  Maternal Grandfather   . Stroke Paternal Grandmother   . Heart disease Father   . Diabetes Mother   . Hypertension Mother   . Prostate cancer Father   . Heart disease Paternal Grandmother    History  Substance Use Topics  . Smoking status: Former Smoker -- 0.50 packs/[employer] for 20 years    Types: Cigarettes  . Smokeless tobacco: Never Used  . Alcohol Use: No     Comment: heavy   OB History   Grav Para Term Preterm Abortions TAB SAB Ect Mult Living                 Review of Systems  Constitutional: Negative for fever.  HENT: Positive for sore throat. Negative for ear discharge, ear pain, sinus pressure, sneezing and trouble swallowing.   Respiratory: Positive for cough. Negative for shortness of breath.   Cardiovascular: Negative for chest pain and leg swelling.  All other systems reviewed and are negative.     Allergies  Avelox; Levofloxacin; Penicillins; and Quinolones  Home Medications   Prior to Admission medications   Medication Sig Start Date End Date Taking? Authorizing Provider  azithromycin (ZITHROMAX) 250 MG tablet Use as a zpack 12/07/13   Gay Filler Copland, MD  gabapentin (NEURONTIN) 800 MG tablet Take 1 tablet (800 mg total) by mouth  2 (two) times daily. 10/08/13   Wardell Honour, MD  ibuprofen (ADVIL,MOTRIN) 800 MG tablet Take 1 tablet (800 mg total) by mouth every 8 (eight) hours as needed. 12/07/13   Darreld Mclean, MD  levothyroxine (SYNTHROID, LEVOTHROID) 50 MCG tablet Take 1 tablet (50 mcg total) by mouth daily before breakfast. 10/08/13   Wardell Honour, MD  mometasone (NASONEX) 50 MCG/ACT nasal spray Place 2 sprays into the nose daily. 10/08/13   Wardell Honour, MD  Multiple Vitamin (MULTIVITAMIN WITH MINERALS) TABS tablet Take 1 tablet by mouth daily.    Historical Provider, MD  predniSONE (DELTASONE) 20 MG tablet Take 2 pills for 3 days, then 1 pill for 3 days 12/12/13   Gay Filler Copland, MD   BP 122/75  Pulse 95  Temp(Src) 98.4 F (36.9 C) (Oral)   Resp 20  SpO2 98%  LMP 12/08/2013 Physical Exam  Nursing note and vitals reviewed. Constitutional: She is oriented to person, place, and time. She appears well-developed and well-nourished. No distress.  HENT:  Head: Normocephalic and atraumatic.  Mouth/Throat: Oropharyngeal exudate (mild, white, streaky) and posterior oropharyngeal erythema (mild, streaky) present. No posterior oropharyngeal edema or tonsillar abscesses.  Eyes: EOM are normal. Pupils are equal, round, and reactive to light.  Neck: Normal range of motion. Neck supple.  Cardiovascular: Normal rate and regular rhythm.  Exam reveals no friction rub.   No murmur heard. Pulmonary/Chest: Effort normal and breath sounds normal. No respiratory distress. She has no wheezes. She has no rales.  Abdominal: Soft. She exhibits no distension. There is no tenderness. There is no rebound.  Musculoskeletal: Normal range of motion. She exhibits no edema.  Neurological: She is alert and oriented to person, place, and time. No cranial nerve deficit. She exhibits normal muscle tone. Coordination normal.  Skin: Skin is warm. No rash noted. She is not diaphoretic.    ED Course  Procedures (including critical care time) Labs Review Labs Reviewed  RAPID STREP SCREEN  MONONUCLEOSIS SCREEN    Imaging Review Dg Chest 2 View  12/18/2013   CLINICAL DATA:  Cough.  EXAM: CHEST  2 VIEW  COMPARISON:  April 13, 2013.  FINDINGS: The heart size and mediastinal contours are within normal limits. Both lungs are clear. No pneumothorax or pleural effusion is noted. The visualized skeletal structures are unremarkable.  IMPRESSION: No acute cardiopulmonary abnormality seen.   Electronically Signed   By: Sabino Dick M.D.   On: 12/18/2013 08:10     EKG Interpretation None      MDM   Final diagnoses:  Bronchitis    65F presents with 2-3 weeks of cough, throat pain. Initially on a Z-pack, then had 5 Berg steroid course, which finished yesterday. No  relief with meds. Persistent productive cough with yellow sputum. Persistent throat pain/swelling. No difficulty swallowing. No SOB, difficulty talking. No fever.  On exam, vitals stable, takling in complete sentences, no respiratory distress. Throat with some streaky redness posteriorly, no frank edema. No uvular swelling/deviation. No tonsils. No stridor. Lungs clear. Belly benign, no splenomegaly or LUQ pain on abdominal pain.  Normal neck ROM, mild bilateral submandibular lymphadenopathy. Clinical history, exam unlikely for RPA. No torticollis/neck stiffness, no fever, well-appearing. Will check CXR for PNA, if no PNA, will treat with doxycycline for bronchitis - may need stronger antbiotics with smoking history.  Will also send Monospot and strep screen.  CXR negative. Monospot and strep screen negative. Given doxycycline for bronchitis. Given tramadol for throat pain. Stable  for discharge. Instructed to get OTC meds for throat pain - like Vicks Chloraseptic spray, for some topical analgesia. I have reviewed all labs and imaging and considered them in my medical decision making.   Osvaldo Shipper, MD 12/18/13 [phone]

## 2013-12-18 NOTE — ED Notes (Signed)
Pt c/o sore throat, cough, and fever. States she has taken a Z-pack and prednisone pack per doctors office and they culture her throat negative results. Pt states her throat is sore, bleeding and has ulcers.

## 2013-12-18 NOTE — Discharge Instructions (Signed)

## 2013-12-20 LAB — CULTURE, GROUP A STREP

## 2014-01-03 ENCOUNTER — Emergency Department (HOSPITAL_COMMUNITY): Payer: Medicare Other

## 2014-01-03 ENCOUNTER — Encounter (HOSPITAL_COMMUNITY): Payer: Self-pay | Admitting: Emergency Medicine

## 2014-01-03 ENCOUNTER — Emergency Department (HOSPITAL_COMMUNITY)
Admission: EM | Admit: 2014-01-03 | Discharge: 2014-01-03 | Disposition: A | Discharge status: Home / Self Care | Payer: Medicare Other | Attending: Emergency Medicine | Admitting: Emergency Medicine

## 2014-01-03 DIAGNOSIS — M545 Low back pain, unspecified: Secondary | ICD-10-CM

## 2014-01-03 DIAGNOSIS — IMO0002 Reserved for concepts with insufficient information to code with codable children: Secondary | ICD-10-CM | POA: Insufficient documentation

## 2014-01-03 DIAGNOSIS — Z9889 Other specified postprocedural states: Secondary | ICD-10-CM | POA: Insufficient documentation

## 2014-01-03 DIAGNOSIS — Z8719 Personal history of other diseases of the digestive system: Secondary | ICD-10-CM | POA: Insufficient documentation

## 2014-01-03 DIAGNOSIS — S93409A Sprain of unspecified ligament of unspecified ankle, initial encounter: Secondary | ICD-10-CM | POA: Insufficient documentation

## 2014-01-03 DIAGNOSIS — S93402A Sprain of unspecified ligament of left ankle, initial encounter: Secondary | ICD-10-CM

## 2014-01-03 DIAGNOSIS — X500XXA Overexertion from strenuous movement or load, initial encounter: Secondary | ICD-10-CM | POA: Insufficient documentation

## 2014-01-03 DIAGNOSIS — Z79899 Other long term (current) drug therapy: Secondary | ICD-10-CM | POA: Insufficient documentation

## 2014-01-03 DIAGNOSIS — Z8544 Personal history of malignant neoplasm of other female genital organs: Secondary | ICD-10-CM | POA: Insufficient documentation

## 2014-01-03 DIAGNOSIS — Z87891 Personal history of nicotine dependence: Secondary | ICD-10-CM | POA: Insufficient documentation

## 2014-01-03 DIAGNOSIS — E039 Hypothyroidism, unspecified: Secondary | ICD-10-CM | POA: Insufficient documentation

## 2014-01-03 DIAGNOSIS — Y92009 Unspecified place in unspecified non-institutional (private) residence as the place of occurrence of the external cause: Secondary | ICD-10-CM | POA: Insufficient documentation

## 2014-01-03 DIAGNOSIS — Z862 Personal history of diseases of the blood and blood-forming organs and certain disorders involving the immune mechanism: Secondary | ICD-10-CM | POA: Insufficient documentation

## 2014-01-03 DIAGNOSIS — G4733 Obstructive sleep apnea (adult) (pediatric): Secondary | ICD-10-CM | POA: Insufficient documentation

## 2014-01-03 DIAGNOSIS — Z880 Allergy status to penicillin: Secondary | ICD-10-CM | POA: Insufficient documentation

## 2014-01-03 DIAGNOSIS — S79929A Unspecified injury of unspecified thigh, initial encounter: Secondary | ICD-10-CM

## 2014-01-03 DIAGNOSIS — W010XXA Fall on same level from slipping, tripping and stumbling without subsequent striking against object, initial encounter: Secondary | ICD-10-CM | POA: Insufficient documentation

## 2014-01-03 DIAGNOSIS — F411 Generalized anxiety disorder: Secondary | ICD-10-CM | POA: Insufficient documentation

## 2014-01-03 DIAGNOSIS — M1710 Unilateral primary osteoarthritis, unspecified knee: Secondary | ICD-10-CM | POA: Insufficient documentation

## 2014-01-03 DIAGNOSIS — S79919A Unspecified injury of unspecified hip, initial encounter: Secondary | ICD-10-CM | POA: Insufficient documentation

## 2014-01-03 DIAGNOSIS — G8929 Other chronic pain: Secondary | ICD-10-CM | POA: Insufficient documentation

## 2014-01-03 DIAGNOSIS — Y9389 Activity, other specified: Secondary | ICD-10-CM | POA: Insufficient documentation

## 2014-01-03 MED ORDER — HYDROMORPHONE HCL PF 1 MG/ML IJ SOLN
1.0000 mg | Freq: Once | INTRAMUSCULAR | Status: AC
  Administered 2014-01-03: 1 mg via INTRAMUSCULAR
  Filled 2014-01-03: qty 1

## 2014-01-03 MED ORDER — OXYCODONE-ACETAMINOPHEN 5-325 MG PO TABS
2.0000 | ORAL_TABLET | Freq: Once | ORAL | Status: AC
  Administered 2014-01-03: 2 via ORAL
  Filled 2014-01-03: qty 2

## 2014-01-03 MED ORDER — TRAMADOL HCL 50 MG PO TABS: 50.0000 mg | ORAL_TABLET | Freq: Four times a day (QID) | ORAL | Status: DC | PRN

## 2014-01-03 MED ORDER — DIAZEPAM 5 MG PO TABS
5.0000 mg | ORAL_TABLET | Freq: Once | ORAL | Status: AC
  Administered 2014-01-03: 5 mg via ORAL
  Filled 2014-01-03: qty 1

## 2014-01-03 MED ORDER — CYCLOBENZAPRINE HCL 10 MG PO TABS: 10.0000 mg | ORAL_TABLET | Freq: Two times a day (BID) | ORAL | Status: DC | PRN

## 2014-01-03 MED ORDER — IBUPROFEN 800 MG PO TABS: 800.0000 mg | ORAL_TABLET | Freq: Three times a day (TID) | ORAL | Status: DC | PRN

## 2014-01-03 NOTE — ED Notes (Signed)
Pt asked to remind Dr Ashok Cordia to make sure she is discharged with a prescription for "strongest dose of flexeril, double dose of tramadol, note stating strict bed rest for a week without any lifting or bending and note stating allow client to keep her cell phone in her room so that I can call for help when needed." Dr Ashok Cordia notified.

## 2014-01-03 NOTE — ED Notes (Signed)
Bed: WH67 Expected date:  Expected time:  Means of arrival:  Comments: ems- back pain, immobilized, hx back surgery

## 2014-01-03 NOTE — ED Notes (Signed)
Per EMS pt coming from drug rehab facility with c/o fall. Per EMS pt sts she was walking down the stairs and "rolled her ankle" Pt reports 9/10 pain in left ankle and lower back. Pt on LSB

## 2014-01-03 NOTE — ED Notes (Signed)
Pt refusing X-Ray until she has received pain meds - Dr. Ashok Cordia aware

## 2014-01-03 NOTE — ED Provider Notes (Addendum)
CSN: [id number]     Arrival date & time 01/03/14  1231 History   First MD Initiated Contact with Patient 01/03/14 1236     Chief Complaint  Patient presents with  . Fall  . Ankle Pain  . Back Pain     (Consider location/radiation/quality/duration/timing/severity/associated sxs/prior Treatment) Patient is a 38 y.o. female presenting with fall, ankle pain, and back pain. The history is provided by the patient.  Fall Pertinent negatives include no chest pain, no abdominal pain, no headaches and no shortness of breath.  Ankle Pain Associated symptoms: back pain   Associated symptoms: no fever and no neck pain   Back Pain Associated symptoms: no abdominal pain, no chest pain, no fever, no headaches, no numbness and no weakness   pt c/o stumble and fall at 'home', just pta today. States rolled left ankle on steps, causing her to fall onto buttocks. C/o left ankle, left hip and low back pain. Constant. Dull. Moderate/severe. Back pain radiates towards left hip. Hx ddd, and remote hx lumbar disc surgery.  Denies any faintness or dizziness prior to fall. No recent illness, states recent health good/at baseline. Denies extremity numbness/weakness. No head injury or headache. No neck or upper back pain. No abd pain. No cp or sob.   Past Medical History  Diagnosis Date  . Narcotic abuse in remission   . Thyroid disease   . Chronic back pain   . Hypothyroidism   . Anxiety   . GERD (gastroesophageal reflux disease)   . Sleep apnea     cpap  . Headache(784.0)   . Neuromuscular disorder     neuropathy  . Arthritis     knees bilateral   DJD  stenosis   . Blood dyscrasia     HSV  . Cancer     vaginal  malignant carcinoma  . PCOS (polycystic ovarian syndrome)    Past Surgical History  Procedure Laterality Date  . Back surgery    . Cesarean section    . Tonsillectomy    . Tubal ligation    . Fracture surgery     Family History  Problem Relation Age of Onset  . Arthritis Mother   .  Clotting disorder Mother   . Hyperlipidemia Father   . Leukemia Maternal Grandmother   . Diabetes Maternal Grandfather   . Stroke Paternal Grandmother   . Heart disease Father   . Diabetes Mother   . Hypertension Mother   . Prostate cancer Father   . Heart disease Paternal Grandmother    History  Substance Use Topics  . Smoking status: Former Smoker -- 0.50 packs/[employer] for 20 years    Types: Cigarettes  . Smokeless tobacco: Never Used  . Alcohol Use: No     Comment: heavy   OB History   Grav Para Term Preterm Abortions TAB SAB Ect Mult Living                 Review of Systems  Constitutional: Negative for fever and chills.  HENT: Negative for nosebleeds.   Eyes: Negative for pain and visual disturbance.  Respiratory: Negative for shortness of breath.   Cardiovascular: Negative for chest pain.  Gastrointestinal: Negative for nausea, vomiting and abdominal pain.  Genitourinary: Negative for flank pain.  Musculoskeletal: Positive for back pain. Negative for neck pain.  Skin: Negative for wound.  Neurological: Negative for weakness, numbness and headaches.  Hematological: Does not bruise/bleed easily.  Psychiatric/Behavioral: Negative for confusion.  Allergies  Avelox; Levofloxacin; Penicillins; and Quinolones  Home Medications   Prior to Admission medications   Medication Sig Start Date End Date Taking? Authorizing Provider  gabapentin (NEURONTIN) 800 MG tablet Take 1 tablet (800 mg total) by mouth 2 (two) times daily. 10/08/13  Yes Wardell Honour, MD  ibuprofen (ADVIL,MOTRIN) 800 MG tablet Take 1 tablet (800 mg total) by mouth every 8 (eight) hours as needed. 12/07/13  Yes Gay Filler Copland, MD  levothyroxine (SYNTHROID, LEVOTHROID) 50 MCG tablet Take 1 tablet (50 mcg total) by mouth daily before breakfast. 10/08/13  Yes Wardell Honour, MD  mometasone (NASONEX) 50 MCG/ACT nasal spray Place 2 sprays into the nose daily. 10/08/13  Yes Wardell Honour, MD  PARoxetine  (PAXIL) 40 MG tablet Take 40 mg by mouth every morning.   Yes Historical Provider, MD   BP 126/50  Temp(Src) 99.4 F (37.4 C) (Oral)  Resp 16  SpO2 98%  LMP 12/08/2013 Physical Exam  Nursing note and vitals reviewed. Constitutional: She is oriented to person, place, and time. She appears well-developed and well-nourished. No distress.  HENT:  Head: Atraumatic.  Mouth/Throat: Oropharynx is clear and moist.  Eyes: Conjunctivae are normal. Pupils are equal, round, and reactive to light. No scleral icterus.  Neck: Neck supple. No tracheal deviation present.  Cardiovascular: Normal rate, regular rhythm, normal heart sounds and intact distal pulses.   Pulmonary/Chest: Effort normal and breath sounds normal. No respiratory distress. She exhibits no tenderness.  Abdominal: Soft. Normal appearance. She exhibits no distension. There is no tenderness.  No abd wall contusion or bruising.   Genitourinary:  No cva tenderness.   Musculoskeletal: She exhibits no edema.  Lumbar tenderness, otherwise CTLS spine, non tender, aligned, no step off. Pelvis stable. Left hip and left lateral malleolar tenderness, otherwise good rom bil ext without pain or other focal bony tenderness. Left ankle and knee stable. Distal pulses palp.    Neurological: She is alert and oriented to person, place, and time.  Motor intact bil. Flex/ext left knee, ankle, and great toe w normal strength, symmetric reflexes, sens intact.   Skin: Skin is warm and dry. No rash noted.  Psychiatric: She has a normal mood and affect.    ED Course  Procedures (including critical care time) Labs Review Dg Chest 2 View    Dg Lumbar Spine Complete  01/03/2014   CLINICAL DATA:  [place] [address] down stairs, low back pain radiating to buttocks, LEFT leg and to LEFT ankle, pins and needles feeling in LEFT foot  EXAM: LUMBAR SPINE - COMPLETE 4+ VIEW  COMPARISON:  05/27/2012  FINDINGS: Five non-rib-bearing lumbar vertebrae.  Prior L5-S1 fusion with  stable disc prosthesis.  Osseous mineralization grossly normal for technique.  Minimal endplate spur formation and disc space narrowing L4-L5.  Vertebral body and disc space heights otherwise maintained.  No acute fracture, subluxation or bone destruction.  No spondylolysis.  SI joints symmetric.  IMPRESSION: Prior L5-S1 fusion.  Minimal degenerative disc disease changes L4-L5.  No acute abnormalities.   Electronically Signed   By: Lavonia Dana M.D.   On: 01/03/2014 14:14   Dg Hip Complete Left  01/03/2014   CLINICAL DATA:  Fall.  Lumbar back pain.  EXAM: LEFT HIP - COMPLETE 2+ VIEW  COMPARISON:  None.  FINDINGS: There is no evidence of hip fracture or dislocation. There is no evidence of arthropathy or other focal bone abnormality. Previous hardware fixation of the lumbar spine.  IMPRESSION: 1. No acute findings.  Electronically Signed   By: Kerby Moors M.D.   On: 01/03/2014 14:17   Dg Ankle Complete Left  01/03/2014   CLINICAL DATA:  Left ankle pain  EXAM: LEFT ANKLE COMPLETE - 3+ VIEW  COMPARISON:  06/12/2012  FINDINGS: Multiple well corticated bony densities are noted adjacent to the medial malleolus likely related to prior trauma. These are similar to that seen on prior exams. No new acute fracture is seen. No gross soft tissue abnormality is noted.  IMPRESSION: Changes of prior trauma.  No acute abnormality is seen.   Electronically Signed   By: Inez Catalina M.D.   On: 01/03/2014 14:29      MDM  Xrays.  Pt requests pain meds, states in [facility], and that can have and wants pain medication in ED, but no rx for home.   Reviewed nursing notes and prior charts for additional history.   Percocet po.  Pt states po meds havent helped, although pt appears more comfortable. Requests shot.  Dilaudid 1 mg im.  Pt requests admission - I explained that given normal xrays, and reassuring exam findings, no indication for admission, and that we would try to best manage symptoms at home.  Discussed  need pcp follow up.   Pt w no numbness/weakness, pain controlled. Pt appears stable for d/c.    Pt states her facility will permit ultram, but no stronger rx.  Will give rx motrin and small quantity ultram. Discussed pcp follow up.    At d/c pt requests noted for bed rest, and keeping phone in her room.      Mirna Mires, MD 01/03/14 [phone]

## 2014-01-03 NOTE — ED Notes (Addendum)
Pt called out, sts needs help going to bathroom and sts she is unable to ambulate, requesting wheelchair. Pt also sts she wants to see Dr Ashok Cordia due to "unbearable pain" and wants to know why he only ordered x-ray instead of MRI, "because they have done it for me before". DrSteinl notified pt  Wants to see him.

## 2014-01-03 NOTE — Discharge Instructions (Signed)
Take motrin as need for pain. You may also take ultram as need for pain - no driving when taking.  Rest. Avoid bending at waist, or heavy lifting > 10 lbs for the next 3 days.  Patient may remain in her room/bed for the next 3 days to rest, and help with her back pain.  Given pain may limit mobility, patient may also keep phone/cellphone with her in room, or at bedside.   You may wear ankle support as need for comfort/support for the next few days.  Follow up with primary care doctor in 1 week if symptoms fail to improve/resolve.  Return to ER if worse, new symptoms, leg numbness/weakness, other concern.  You were given pain medication in the ER - no driving for the next 4 hours.     Ankle Sprain An ankle sprain is an injury to the strong, fibrous tissues (ligaments) that hold the bones of your ankle joint together.  CAUSES An ankle sprain is usually caused by a fall or by twisting your ankle. Ankle sprains most commonly occur when you step on the outer edge of your foot, and your ankle turns inward. People who participate in sports are more prone to these types of injuries.  SYMPTOMS   Pain in your ankle. The pain may be present at rest or only when you are trying to stand or walk.  Swelling.  Bruising. Bruising may develop immediately or within 1 to 2 days after your injury.  Difficulty standing or walking, particularly when turning corners or changing directions. DIAGNOSIS  Your caregiver will ask you details about your injury and perform a physical exam of your ankle to determine if you have an ankle sprain. During the physical exam, your caregiver will press on and apply pressure to specific areas of your foot and ankle. Your caregiver will try to move your ankle in certain ways. An X-ray exam may be done to be sure a bone was not broken or a ligament did not separate from one of the bones in your ankle (avulsion fracture).  TREATMENT  Certain types of braces can help stabilize  your ankle. Your caregiver can make a recommendation for this. Your caregiver may recommend the use of medicine for pain. If your sprain is severe, your caregiver may refer you to a surgeon who helps to restore function to parts of your skeletal system (orthopedist) or a physical therapist. [place] ice to your injury for 1 2 days or as directed by your caregiver. Applying ice helps to reduce inflammation and pain.  Put ice in a plastic bag.  Place a towel between your skin and the bag.  Leave the ice on for 15-20 minutes at a time, every 2 hours while you are awake.  Only take over-the-counter or prescription medicines for pain, discomfort, or fever as directed by your caregiver.  Elevate your injured ankle above the level of your heart as much as possible for 2 3 days.  If your caregiver recommends crutches, use them as instructed. Gradually put weight on the affected ankle. Continue to use crutches or a cane until you can walk without feeling pain in your ankle.  If you have a plaster splint, wear the splint as directed by your caregiver. Do not rest it on anything harder than a pillow for the first 24 hours. Do not put weight on it. Do not get it wet. You may take it off to take a shower or bath.  You  may have been given an elastic bandage to wear around your ankle to provide support. If the elastic bandage is too tight (you have numbness or tingling in your foot or your foot becomes cold and blue), adjust the bandage to make it comfortable.  If you have an air splint, you may blow more air into it or let air out to make it more comfortable. You may take your splint off at night and before taking a shower or bath. Wiggle your toes in the splint several times per [place] to decrease swelling. SEEK MEDICAL CARE IF:   You have rapidly increasing bruising or swelling.  Your toes feel extremely cold or you lose feeling in your foot.  Your pain is not relieved with  medicine. SEEK IMMEDIATE MEDICAL CARE IF:  Your toes are numb or blue.  You have severe pain that is increasing. MAKE SURE YOU:   Understand these instructions.  Will watch your condition.  Will get help right away if you are not doing well or get worse. Document Released: 07/13/2005 Document Revised: 04/06/2012 Document Reviewed: 07/25/2011 [facility] Patient Information 2014 [place], [place].    Back Pain, Adult Low back pain is very common. About 1 in 5 people have back pain.The cause of low back pain is rarely dangerous. The pain often gets better over time.About half of people with a sudden onset of back pain feel better in just 2 weeks. About 8 in 10 people feel better by 6 weeks.  CAUSES Some common causes of back pain include:  Strain of the muscles or ligaments supporting the spine.  Wear and tear (degeneration) of the spinal discs.  Arthritis.  Direct injury to the back. DIAGNOSIS Most of the time, the direct cause of low back pain is not known.However, back pain can be treated effectively even when the exact cause of the pain is unknown.Answering your caregiver's questions about your overall health and symptoms is one of the most accurate ways to make sure the cause of your pain is not dangerous. If your caregiver needs more information, he or she may order lab work or imaging tests (X-rays or MRIs).However, even if imaging tests show changes in your back, this usually does not require surgery. HOME CARE INSTRUCTIONS For many people, back pain returns.Since low back pain is rarely dangerous, it is often a condition that people can learn to [facility] their own.   Remain active. It is stressful on the back to sit or stand in one place. Do not sit, drive, or stand in one place for more than 30 minutes at a time. Take short walks on level surfaces as soon as pain allows.Try to increase the length of time you walk each [place].  Do not stay in bed.Resting more than 1 or 2  days can delay your recovery.  Do not avoid exercise or work.Your body is made to move.It is not dangerous to be active, even though your back may hurt.Your back will likely heal faster if you return to being active before your pain is gone.  Pay attention to your body when you bend and lift. Many people have less discomfortwhen lifting if they bend their knees, keep the load close to their bodies,and avoid twisting. Often, the most comfortable positions are those that put less stress on your recovering back.  Find a comfortable position to sleep. Use a firm mattress and lie on your side with your knees slightly bent. If you lie on your back, put a pillow under your  knees.  Only take over-the-counter or prescription medicines as directed by your caregiver. Over-the-counter medicines to reduce pain and inflammation are often the most helpful.Your caregiver may prescribe muscle relaxant drugs.These medicines help dull your pain so you can more quickly return to your normal activities and healthy exercise.  Put ice on the injured area.  Put ice in a plastic bag.  Place a towel between your skin and the bag.  Leave the ice on for 15-20 minutes, 03-04 times a [place] for the first 2 to 3 days. After that, ice and heat may be alternated to reduce pain and spasms.  Ask your caregiver about trying back exercises and gentle massage. This may be of some benefit.  Avoid feeling anxious or stressed.Stress increases muscle tension and can worsen back pain.It is important to recognize when you are anxious or stressed and learn ways to manage it.Exercise is a great option. SEEK MEDICAL CARE IF:  You have pain that is not relieved with rest or medicine.  You have pain that does not improve in 1 week.  You have new symptoms.  You are generally not feeling well. SEEK IMMEDIATE MEDICAL CARE IF:   You have pain that radiates from your back into your legs.  You develop new bowel or bladder  control problems.  You have unusual weakness or numbness in your arms or legs.  You develop nausea or vomiting.  You develop abdominal pain.  You feel faint. Document Released: 07/13/2005 Document Revised: 01/12/2012 Document Reviewed: 12/01/2010 [facility] Patient Information 2014 [place], [place].   Lumbosacral Strain Lumbosacral strain is a strain of any of the parts that make up your lumbosacral vertebrae. Your lumbosacral vertebrae are the bones that make up the lower third of your backbone. Your lumbosacral vertebrae are held together by muscles and tough, fibrous tissue (ligaments).  CAUSES  A sudden blow to your back can cause lumbosacral strain. Also, anything that causes an excessive stretch of the muscles in the low back can cause this strain. This is typically seen when people exert themselves strenuously, fall, lift heavy objects, bend, or crouch repeatedly. RISK FACTORS  Physically demanding work.  Participation in pushing or pulling sports or sports that require sudden twist of the back (tennis, golf, baseball).  Weight lifting.  Excessive lower back curvature.  Forward-tilted pelvis.  Weak back or abdominal muscles or both.  Tight hamstrings. SIGNS AND SYMPTOMS  Lumbosacral strain may cause pain in the area of your injury or pain that moves (radiates) down your leg.  DIAGNOSIS Your health care provider can often diagnose lumbosacral strain through a physical exam. In some cases, you may need tests such as X-ray exams.  TREATMENT  Treatment for your lower back injury depends on many factors that your clinician will have to evaluate. However, most treatment will include the use of anti-inflammatory medicines. HOME CARE INSTRUCTIONS   Avoid hard physical activities (tennis, racquetball, waterskiing) if you are not in proper physical condition for it. This may aggravate or create problems.  If you have a back problem, avoid sports requiring sudden body movements.  Swimming and walking are generally safer activities.  Maintain good posture.  Maintain a healthy weight.  For acute conditions, you may put ice on the injured area.  Put ice in a plastic bag.  Place a towel between your skin and the bag.  Leave the ice on for 20 minutes, 2 3 times a [place].  When the low back starts healing, stretching and strengthening exercises may be  recommended. SEEK MEDICAL CARE IF:  Your back pain is getting worse.  You experience severe back pain not relieved with medicines. SEEK IMMEDIATE MEDICAL CARE IF:   You have numbness, tingling, weakness, or problems with the use of your arms or legs.  There is a change in bowel or bladder control.  You have increasing pain in any area of the body, including your belly (abdomen).  You notice shortness of breath, dizziness, or feel faint.  You feel sick to your stomach (nauseous), are throwing up (vomiting), or become sweaty.  You notice discoloration of your toes or legs, or your feet get very cold. MAKE SURE YOU:   Understand these instructions.  Will watch your condition.  Will get help right away if you are not doing well or get worse. Document Released: 04/22/2005 Document Revised: 05/03/2013 Document Reviewed: 03/01/2013 [facility] Patient Information 2014 [place], [place].   Degenerative Disk Disease Degenerative disk disease is a condition caused by the changes that occur in the cushions of the backbone (spinal disks) as you grow older. Spinal disks are soft and compressible disks located between the bones of the spine (vertebrae). They act like shock absorbers. Degenerative disk disease can affect the whole spine. However, the neck and lower back are most commonly affected. Many changes can occur in the spinal disks with aging, such as:  The spinal disks may dry and shrink.  Small tears may occur in the tough, outer covering of the disk (annulus).  The disk space may become smaller due to loss of  water.  Abnormal growths in the bone (spurs) may occur. This can put pressure on the nerve roots exiting the spinal canal, causing pain.  The spinal canal may become narrowed. CAUSES  Degenerative disk disease is a condition caused by the changes that occur in the spinal disks with aging. The exact cause is not known, but there is a genetic basis for many patients. Degenerative changes can occur due to loss of fluid in the disk. This makes the disk thinner and reduces the space between the backbones. Small cracks can develop in the outer layer of the disk. This can lead to the breakdown of the disk. You are more likely to get degenerative disk disease if you are overweight. Smoking cigarettes and doing heavy work such as weightlifting can also increase your risk of this condition. Degenerative changes can start after a sudden injury. Growth of bone spurs can compress the nerve roots and cause pain.  SYMPTOMS  The symptoms vary from person to person. Some people may have no pain, while others have severe pain. The pain may be so severe that it can limit your activities. The location of the pain depends on the part of your backbone that is affected. You will have neck or arm pain if a disk in the neck area is affected. You will have pain in your back, buttocks, or legs if a disk in the lower back is affected. The pain becomes worse while bending, reaching up, or with twisting movements. The pain may start gradually and then get worse as time passes. It may also start after a major or minor injury. You may feel numbness or tingling in the arms or legs.  DIAGNOSIS  Your caregiver will ask you about your symptoms and about activities or habits that may cause the pain. He or she may also ask about any injuries, diseases, or treatments you have had earlier. Your caregiver will examine you to check for the range  of movement that is possible in the affected area, to check for strength in your extremities, and to  check for sensation in the areas of the arms and legs supplied by different nerve roots. An X-ray of the spine may be taken. Your caregiver may suggest other imaging tests, such as magnetic resonance imaging (MRI), if needed.  TREATMENT  Treatment includes rest, modifying your activities, and applying ice and heat. Your caregiver may prescribe medicines to reduce your pain and may ask you to do some exercises to strengthen your back. In some cases, you may need surgery. You and your caregiver will decide on the treatment that is best for you. HOME CARE INSTRUCTIONS   Follow proper lifting and walking techniques as advised by your caregiver.  Maintain good posture.  Exercise regularly as advised.  Perform relaxation exercises.  Change your sitting, standing, and sleeping habits as advised. Change positions frequently.  Lose weight as advised.  Stop smoking if you smoke.  Wear supportive footwear. SEEK MEDICAL CARE IF:  Your pain does not go away within 1 to 4 weeks. SEEK IMMEDIATE MEDICAL CARE IF:   Your pain is severe.  You notice weakness in your arms, hands, or legs.  You begin to lose control of your bladder or bowel movements. MAKE SURE YOU:   Understand these instructions.  Will watch your condition.  Will get help right away if you are not doing well or get worse. Document Released: 05/10/2007 Document Revised: 10/05/2011 Document Reviewed: 05/10/2007 [facility] Patient Information 2014 [place].

## 2014-01-09 ENCOUNTER — Encounter: Payer: Self-pay | Admitting: Neurology

## 2014-01-17 ENCOUNTER — Ambulatory Visit (INDEPENDENT_AMBULATORY_CARE_PROVIDER_SITE_OTHER): Payer: Medicare Other | Admitting: Neurology

## 2014-01-17 ENCOUNTER — Encounter: Payer: Self-pay | Admitting: Neurology

## 2014-01-17 VITALS — BP 132/98 | HR 108 | Temp 99.5°F | Ht 65.5 in | Wt 320.0 lb

## 2014-01-17 DIAGNOSIS — G2581 Restless legs syndrome: Secondary | ICD-10-CM

## 2014-01-17 DIAGNOSIS — G4733 Obstructive sleep apnea (adult) (pediatric): Secondary | ICD-10-CM

## 2014-01-17 DIAGNOSIS — G4761 Periodic limb movement disorder: Secondary | ICD-10-CM

## 2014-01-17 NOTE — Progress Notes (Signed)
Subjective:    Patient ID: [name] is a 38 y.o. female.  HPI    Interim history:   [name] is a 38 year old right-handed woman with an underlying medical history of obesity, mood disorder, hypothyroidism, substance abuse (narcotic, nicotine and EtOH), in remission, smoking, and chronic back pain, who presents for followup consultation of her obstructive sleep apnea. The patient is unaccompanied today. Of note she missed an appointment for 11/24/2013. In the interim she was recently seen in the emergency room on 01/03/2014 after a fall. She was treated with symptomatic pain medication. I first met her on 10/09/2013 at which time she reported a prior diagnosis of OSA in 2011 but she was not on treatment with CPAP as she had stopped using her machine and needed a complete reevaluation. I asked her to come back for sleep study. She had a split-night sleep study on 10/10/2013 and went over his test results with her in detail today. Her baseline sleep efficiency was reduced markedly at 56.5% with a latency to sleep prolonged at 87.5 minutes and wake after sleep onset of 19.5 minutes with mild sleep fragmentation noted. The baseline arousal index was mildly elevated. She had a markedly increased percentage of stage II sleep, absence of slow wave sleep and 16.5% of REM sleep with a normal REM latency. She had severe periodic leg movements of sleep with nearly no arousals associated. She had a total AHI of 17.7 per hour, with baseline oxygen saturation of 94% and a nadir of 84%. She was titrated on CPAP and sleep efficiency was significantly increased at 91.4%. She had normal percentages of stage I and stage II sleep, absence of slow-wave sleep and an increased percentage of REM sleep with a reduced REM latency. CPAP was titrated from 5-12 cm with a reduction of the AHI to 2.8 events per hour at the final pressure with supine REM sleep achieved. Based on the test results I prescribed CPAP for her. I reviewed  her compliance data from 10/17/2013 through 11/15/2013 which is a total of 30 days during which time she was CPAP on 25 nights. Percent used days greater than 4 hours was only 57%, indicating suboptimal compliance. Residual AHI was 4.4 per hour indicating inadequate pressure and leak for the most part was acceptable with the 95th percentile of leak at 20.4 L per minute. I also reviewed compliance data from 12/10/2013 through 01/08/2014 which is a total of 30 days during which time she used CPAP only 18 days with percent used days greater than 4 hours of only 50%, average usage of 3 hours and 31 minutes, and residual AHI of 4.5/h with low leak. Setting of 12 cm with EPR of 1.  Today, I reviewed her compliance data from 12/18/2013 through 01/16/2014 which is the last 30 days, during which she used CPAP only 19 days with percent used days greater than 4 hours at only 57%, residual AHI acceptable at 3.9 per hour, leak acceptable at 11.3 L per minute for the 95th percentile, average usage of only 3 hours and 58 minutes.  Today, she reports that she still has back pain from the fall earlier this month. She saw her back doctor, Dr. Patrice Paradise, who ordered an MRI and she reports, that she has a ruptured disk, but surgery is not an option. She still lives at [facility] with her 37 yo daughter. She may be moving out soon, but is looking for another option. She is reports significant RLS symptoms, which bother her  going to sleep and she still kicks in her sleep and rocks and kicks before falling asleep. She had a normal TSH 3 months ago. She endorses good results with CPAP in general but has residual mouth dryness from mouth opening despite using a chin strap and swelling of her uvula. She has tried honey and gargling with salt water. She used a fullface mask but did not like it in the past.  Her Past Medical History Is Significant For: Past Medical History  Diagnosis Date  . Narcotic abuse in remission   . Thyroid  disease   . Chronic back pain   . Hypothyroidism   . Anxiety   . GERD (gastroesophageal reflux disease)   . Sleep apnea     cpap  . Headache(784.0)   . Neuromuscular disorder     neuropathy  . Arthritis     knees bilateral   DJD  stenosis   . Blood dyscrasia     HSV  . Cancer     vaginal  malignant carcinoma  . PCOS (polycystic ovarian syndrome)     Her Past Surgical History Is Significant For: Past Surgical History  Procedure Laterality Date  . Back surgery    . Cesarean section    . Tonsillectomy    . Tubal ligation    . Fracture surgery      Her Family History Is Significant For: Family History  Problem Relation Age of Onset  . Arthritis Mother   . Clotting disorder Mother   . Hyperlipidemia Father   . Leukemia Maternal Grandmother   . Diabetes Maternal Grandfather   . Stroke Paternal Grandmother   . Heart disease Father   . Diabetes Mother   . Hypertension Mother   . Prostate cancer Father   . Heart disease Paternal Grandmother     Her Social History Is Significant For: History   Social History  . Marital Status: Divorced    Spouse Name: N/A    Number of Children: N/A  . Years of Education: N/A   Occupational History  . disability     RN; Psychologist, occupational at substance abuse counselor at [employer] History Main Topics  . Smoking status: Former Smoker -- 0.50 packs/Pember for 20 years    Types: Cigarettes  . Smokeless tobacco: Never Used  . Alcohol Use: No     Comment: heavy  . Drug Use: No     Comment: former narcotic abuse  . Sexual Activity: Yes    Birth Control/ Protection: Surgical   Other Topics Concern  . None   Social History Narrative  . None    Her Allergies Are:  Allergies  Allergen Reactions  . Avelox [Moxifloxacin Hcl In Nacl] Hives  . Levofloxacin Hives  . Penicillins Hives  . Quinolones Hives  :   Her Current Medications Are:  Outpatient Encounter Prescriptions as of 01/17/2014  Medication Sig  . gabapentin  (NEURONTIN) 800 MG tablet Take 1 tablet (800 mg total) by mouth 2 (two) times daily.  Marland Kitchen ibuprofen (ADVIL,MOTRIN) 800 MG tablet Take 1 tablet (800 mg total) by mouth every 8 (eight) hours as needed for moderate pain. Take with food.  Marland Kitchen levothyroxine (SYNTHROID, LEVOTHROID) 50 MCG tablet Take 1 tablet (50 mcg total) by mouth daily before breakfast.  . PARoxetine (PAXIL) 40 MG tablet Take 40 mg by mouth every morning.  . [DISCONTINUED] cyclobenzaprine (FLEXERIL) 10 MG tablet Take 1 tablet (10 mg total) by mouth 2 (two) times daily as  needed for muscle spasms.  . [DISCONTINUED] ibuprofen (ADVIL,MOTRIN) 800 MG tablet Take 1 tablet (800 mg total) by mouth every 8 (eight) hours as needed.  . [DISCONTINUED] mometasone (NASONEX) 50 MCG/ACT nasal spray Place 2 sprays into the nose daily.  . [DISCONTINUED] traMADol (ULTRAM) 50 MG tablet Take 1 tablet (50 mg total) by mouth every 6 (six) hours as needed.  :  Review of Systems:  Out of a complete 14 point review of systems, all are reviewed and negative with the exception of these symptoms as listed below:  Review of Systems  Constitutional: Negative.   HENT: Positive for sore throat.   Eyes: Negative.   Respiratory: Negative.   Cardiovascular: Negative.   Gastrointestinal: Positive for diarrhea.  Endocrine: Positive for heat intolerance.  Genitourinary: Negative.   Musculoskeletal: Positive for arthralgias, back pain, gait problem, joint swelling and myalgias.  Skin: Negative.   Allergic/Immunologic: Positive for immunocompromised state.  Hematological: Bruises/bleeds easily.  Psychiatric/Behavioral: Positive for sleep disturbance (restless leg, apnea, e.d.s., snoring, shift work) and decreased concentration. The patient is nervous/anxious and is hyperactive.     Objective:  Neurologic Exam  Physical Exam Physical Examination:   Filed Vitals:   01/17/14 0939  BP: 132/98  Pulse: 108  Temp: 99.5 F (37.5 C)   General Examination: The  patient is a very pleasant 38 y.o. female in no acute distress. She appears well-developed and well-nourished and well groomed. She is obese.  HEENT: Normocephalic, atraumatic, pupils are equal, round and reactive to light and accommodation. Funduscopic exam is normal with sharp disc margins noted. Extraocular tracking is good without limitation to gaze excursion or nystagmus noted. Normal smooth pursuit is noted. Hearing is grossly intact. Face is symmetric with normal facial animation and normal facial sensation. Speech is clear with no dysarthria noted. There is no hypophonia. There is no lip, neck/head, jaw or voice tremor. Neck is supple with full range of passive and active motion. There are no carotid bruits on auscultation. Oropharynx exam reveals: mild mouth dryness, adequate dental hygiene and moderate airway crowding, due to larger uvula and thicker soft palate. Uvula is mildly swollen and erythematous. Pharyngeal tissue is not inflamed appearing. Mallampati is class II. Tongue protrudes centrally and palate elevates symmetrically. Tonsils are absent. Neck size is large.   Chest: Clear to auscultation without wheezing, rhonchi or crackles noted.  Heart: S1+S2+0, regular and normal without murmurs, rubs or gallops noted.   Abdomen: Soft, non-tender and non-distended with normal bowel sounds appreciated on auscultation.  Extremities: There is no pitting edema in the distal lower extremities bilaterally. Pedal pulses are intact.  Skin: Warm and dry without trophic changes noted. There are no varicose veins.  Musculoskeletal: exam reveals no obvious joint deformities, tenderness or joint swelling or erythema.   Neurologically:  Mental status: The patient is awake, alert and oriented in all 4 spheres. Her immediate and remote memory, attention, language skills and fund of knowledge are appropriate. There is no evidence of aphasia, agnosia, apraxia or anomia. Speech is clear with normal prosody  and enunciation. Thought process is linear. Mood is normal and affect is normal.  Cranial nerves II - XII are as described above under HEENT exam. In addition: shoulder shrug is normal with equal shoulder height noted. Motor exam: Normal bulk, strength and tone is noted. There is no drift, tremor or rebound. Romberg is negative. Reflexes are 2+ throughout. Babinski: Toes are flexor bilaterally. Fine motor skills and coordination: intact with normal finger taps, normal hand  movements, normal rapid alternating patting, normal foot taps and normal foot agility.  Cerebellar testing: No dysmetria or intention tremor on finger to nose testing. Heel to shin is unremarkable bilaterally. There is no truncal or gait ataxia.  Sensory exam: intact to light touch, pinprick, vibration, temperature sense in the upper and lower extremities.  Gait, station and balance: She stands easily. No veering to one side is noted. No leaning to one side is noted. Posture is age-appropriate and stance is narrow based. Gait shows normal stride length and normal pace. No problems turning are noted. She turns en bloc. Tandem walk is unremarkable. Intact toe and heel stance is noted.    Assessment and Plan:   In summary, [name] is a very pleasant 38 y.o.-year old female with an underlying medical history of obesity, mood disorder, hypothyroidism, substance abuse (narcotic, nicotine and EtOH), in remission, smoking, and chronic back pain with recent exacerbation after a fall, who presents for followup consultation of her obstructive sleep apnea, which is moderate to severe. She has since been established treatment with CPAP at a pressure of 12 cm. She does endorse good results with it but is not fully compliant. She says that she has skipped a Munger here and there but it looks more like 2 or 3 days in a row. She also went on a vacation trip out of town and did not take her machine with her which caused a four-Chivers lapse. I talked her  abou her sleep test results in detail and also explained her compliance data. She is encouraged to be fully compliant with CPAP therapy. She has evidence of significant periodic leg movements but no significant arousals. She does endorse restless leg symptoms. We will go ahead and do iron studies today. She had a recent TSH checked in the last 3 months. If her ferritin level is below 50 I will ask her to start over-the-counter iron supplements and if she is anemic she will need evaluation for her anemia. She is advised that we can consider a dopamine agonist down the Road for symptomatic treatment of her restless legs versus gabapentin. I would probably favor something like Horizant in her case. At this juncture I have advised her that been compliant with CPAP therapy may also help her leg movements and her RLS symptoms and we will also check iron studies today and call her back with results. Her physical exam is stable and I encouraged her to continue to use CPAP regularly to help reduce cardiovascular risk and to improve her sleep related symptoms.  for her uvula irritation and mouth dryness she is advised we can try another fullface mask. There is a narrow mask out that is not as large and cumbersome to use. When she is up for her new mask she can maybe try a new fullface mask.  We also talked about trying to maintaining a healthy lifestyle in general. I encouraged the patient to eat healthy, exercise daily and keep well hydrated, to keep a scheduled bedtime and wake time routine, to not skip any meals and eat healthy snacks in between meals and to have protein with every meal. I stressed the importance of regular exercise.   I answered all  her questions today and the patient was in agreement with the above outlined plan. I would like to see the patient back in 6 months, sooner if the need arises and encouraged  her to call with any interim questions, concerns, problems or updates.

## 2014-01-17 NOTE — Addendum Note (Signed)
Addended by: Star Age on: 01/17/2014 02:53 PM   Modules accepted: Orders

## 2014-01-17 NOTE — Patient Instructions (Addendum)
Please continue using your CPAP regularly. While your insurance requires that you use CPAP at least 4 hours each night on 70% of the nights, I recommend, that you not skip any nights and use it throughout the night if you can. Getting used to CPAP and staying with the treatment long term does take time and patience and discipline. Untreated obstructive sleep apnea when it is moderate to severe can have an adverse impact on cardiovascular health and raise her risk for heart disease, arrhythmias, hypertension, congestive heart failure, stroke and diabetes. Untreated obstructive sleep apnea causes sleep disruption, nonrestorative sleep, and sleep deprivation. This can have an impact on your [place] functioning and cause daytime sleepiness and impairment of cognitive function, memory loss, mood disturbance, and problems focussing. Using CPAP regularly can improve these symptoms.  Today, we will check your iron levels and we will call you back. We may consider treating your RLS symptoms with medication in the near future.

## 2014-01-18 LAB — CBC WITH DIFFERENTIAL
BASOS ABS: 0 10*3/uL (ref 0.0–0.2)
Basos: 1 %
EOS ABS: 0.1 10*3/uL (ref 0.0–0.4)
Eos: 1 %
HEMATOCRIT: 38.9 % (ref 34.0–46.6)
HEMOGLOBIN: 13.4 g/dL (ref 11.1–15.9)
Immature Grans (Abs): 0 10*3/uL (ref 0.0–0.1)
Immature Granulocytes: 0 %
LYMPHS: 26 %
Lymphocytes Absolute: 2 10*3/uL (ref 0.7–3.1)
MCH: 31.4 pg (ref 26.6–33.0)
MCHC: 34.4 g/dL (ref 31.5–35.7)
MCV: 91 fL (ref 79–97)
MONOS ABS: 0.5 10*3/uL (ref 0.1–0.9)
Monocytes: 6 %
NEUTROS ABS: 5.2 10*3/uL (ref 1.4–7.0)
NEUTROS PCT: 66 %
Platelets: 242 10*3/uL (ref 150–379)
RBC: 4.27 x10E6/uL (ref 3.77–5.28)
RDW: 13.8 % (ref 12.3–15.4)
WBC: 7.9 10*3/uL (ref 3.4–10.8)

## 2014-01-18 LAB — IRON AND TIBC
IRON SATURATION: 22 % (ref 15–55)
Iron: 72 ug/dL (ref 35–155)
TIBC: 334 ug/dL (ref 250–450)
UIBC: 262 ug/dL (ref 150–375)

## 2014-01-18 LAB — FERRITIN: Ferritin: 38 ng/mL (ref 15–150)

## 2014-01-18 NOTE — Progress Notes (Signed)
Quick Note:  Please call patient and advise her that her storage iron which is called ferritin is indeed a little low and can be a contributor to restless leg symptoms. Therefore, I would like for her to start an over-the-counter iron supplement. She can go with ferrous sulfate or ferrous gluconate, not everybody tolerates iron very well and it can cause constipation or diarrhea. It is better to take it with a little bit of orange juice because the vitamin C helps with absorption. She can follow the directions on the medication bottle or box. Even though she is not frankly anemic, no storage iron or ferritin can exacerbate RLS symptoms. Star Age, MD, PhD [facility] ([facility])  ______

## 2014-01-19 NOTE — Progress Notes (Signed)
Quick Note:  I called pt and relayed the results of cbc and iron, ferritin level results. Low ferritin may exacerbate RLS. No frank anemia noted. Relayed about taking otc ferrous sulfate or gluconate. Pt vrebalized understanding. ______

## 2014-01-24 ENCOUNTER — Ambulatory Visit (INDEPENDENT_AMBULATORY_CARE_PROVIDER_SITE_OTHER): Payer: Medicare Other | Admitting: Family Medicine

## 2014-01-24 DIAGNOSIS — G2581 Restless legs syndrome: Secondary | ICD-10-CM

## 2014-01-24 DIAGNOSIS — E039 Hypothyroidism, unspecified: Secondary | ICD-10-CM

## 2014-01-24 DIAGNOSIS — G4733 Obstructive sleep apnea (adult) (pediatric): Secondary | ICD-10-CM

## 2014-01-24 DIAGNOSIS — G8929 Other chronic pain: Secondary | ICD-10-CM

## 2014-01-24 DIAGNOSIS — G4761 Periodic limb movement disorder: Secondary | ICD-10-CM

## 2014-01-24 DIAGNOSIS — L03319 Cellulitis of trunk, unspecified: Secondary | ICD-10-CM

## 2014-01-24 DIAGNOSIS — IMO0002 Reserved for concepts with insufficient information to code with codable children: Secondary | ICD-10-CM

## 2014-01-24 DIAGNOSIS — F39 Unspecified mood [affective] disorder: Secondary | ICD-10-CM

## 2014-01-24 DIAGNOSIS — L02219 Cutaneous abscess of trunk, unspecified: Secondary | ICD-10-CM

## 2014-01-24 DIAGNOSIS — L03313 Cellulitis of chest wall: Secondary | ICD-10-CM

## 2014-01-24 DIAGNOSIS — M5416 Radiculopathy, lumbar region: Secondary | ICD-10-CM

## 2014-01-24 MED ORDER — GABAPENTIN 800 MG PO TABS: 800.0000 mg | ORAL_TABLET | Freq: Two times a day (BID) | ORAL | Status: DC

## 2014-01-24 MED ORDER — LEVOTHYROXINE SODIUM 50 MCG PO TABS: 50.0000 ug | ORAL_TABLET | Freq: Every day | ORAL | Status: DC

## 2014-01-24 MED ORDER — DOXYCYCLINE HYCLATE 100 MG PO TABS: 100.0000 mg | ORAL_TABLET | Freq: Two times a day (BID) | ORAL | Status: DC

## 2014-01-24 MED ORDER — PAROXETINE HCL 40 MG PO TABS: 40.0000 mg | ORAL_TABLET | Freq: Every morning | ORAL | Status: DC

## 2014-01-24 MED ORDER — IBUPROFEN 800 MG PO TABS: 800.0000 mg | ORAL_TABLET | Freq: Three times a day (TID) | ORAL | Status: DC | PRN

## 2014-01-24 NOTE — Patient Instructions (Addendum)
Regarding the restless legs and limb movements - I reviewed Dr. Guadelupe Sabin notes and it appears your ferritin was low, which may contribute to RLS symptoms, Iron supplement as she recommended. She also wanted to see if increased CPAP use will help RLS symptoms/limb movements. Dr. Rexene Alberts did recommend other initial treatments for the RLS to start, then to consider medicine like Requip if not helping.   I will relay the information about the Paxil question to Dr. Laney Pastor, but recommend you follow up with him to discuss weaning this. I refilled same dose for now.  I will send a copy of note from today to him.   I will refill your thyroid medication, neurontin and ibuprofen today with a refill,  but further refills need to be provided by Dr. Laney Pastor. I will put a message into our schedulers to try to arrange an appointment, but depending on his schedule, you may need to walk in to be seen by him at [address].    For the infection - start doxycycline. Warm compresses at least 4-5 times per Merten and recheck in 2 days - you can ask for Chelle or Benjamine Mola for wound care follow up then.   Return to the clinic or go to the nearest emergency room if any of your symptoms worsen or new symptoms occur.  Cellulitis Cellulitis is an infection of the skin and the tissue beneath it. The infected area is usually red and tender. Cellulitis occurs most often in the arms and lower legs.  CAUSES  Cellulitis is caused by bacteria that enter the skin through cracks or cuts in the skin. The most common types of bacteria that cause cellulitis are Staphylococcus and Streptococcus. SYMPTOMS   Redness and warmth.  Swelling.  Tenderness or pain.  Fever. DIAGNOSIS  Your caregiver can usually determine what is wrong based on a physical exam. Blood tests may also be done. TREATMENT  Treatment usually involves taking an antibiotic medicine. HOME CARE INSTRUCTIONS   Take your antibiotics as directed. Finish them even if  you start to feel better.  Keep the infected arm or leg elevated to reduce swelling.  Apply a warm cloth to the affected area up to 4 times per Cathers to relieve pain.  Only take over-the-counter or prescription medicines for pain, discomfort, or fever as directed by your caregiver.  Keep all follow-up appointments as directed by your caregiver. SEEK MEDICAL CARE IF:   You notice red streaks coming from the infected area.  Your red area gets larger or turns dark in color.  Your bone or joint underneath the infected area becomes painful after the skin has healed.  Your infection returns in the same area or another area.  You notice a swollen bump in the infected area.  You develop new symptoms. SEEK IMMEDIATE MEDICAL CARE IF:   You have a fever.  You feel very sleepy.  You develop vomiting or diarrhea.  You have a general ill feeling (malaise) with muscle aches and pains. MAKE SURE YOU:   Understand these instructions.  Will watch your condition.  Will get help right away if you are not doing well or get worse. Document Released: 04/22/2005 Document Revised: 01/12/2012 Document Reviewed: 09/28/2011 [facility] Patient Information 2015 [place], [place]. This information is not intended to replace advice given to you by your health care provider. Make sure you discuss any questions you have with your health care provider.

## 2014-01-24 NOTE — Progress Notes (Signed)
Patient ID: Larena Glassman Mohl MRN: [id number], DOB: 09/15/1975, 38 y.o. Date of Encounter: 01/24/2014, 12:56 PM    PROCEDURE NOTE: Verbal consent obtained. Local anesthesia obtained with 2% lidocaine plain.   1 cm incision made with 11 blade along lesion.  Culture taken. Scant purulence expressed. Eschar debrided.  Lesion explored revealing no loculations. Packed with 1/4 plain packing.  Dressed. Wound care instructions including precautions with patient. Patient tolerated the procedure well. Recheck in 48 hours.      Angela Nevin, PA-C 01/24/2014 12:56 PM

## 2014-01-24 NOTE — Progress Notes (Signed)
Subjective:    Patient ID: [name], female    DOB: 1976-02-08, 38 y.o.   MRN: [id number]  HPI Chief Complaint  Patient presents with  . Medication Refill    NEURONTIN, IBPROPHEN 800, SYNTHROID, PAXIL  . MRSA    left axillary area   This chart was scribed for Merri Ray, MD by Thea Alken, ED Scribe. This patient was seen in room 12 and [name].  HPI Comments: [name] is a 38 y.o. female who is a primary patient of Dr. Laney Pastor. Here for multiple medication refill and rash located to left axillary.  1. Hypothyroidism- last TSH was in march by Dr. Tamala Julian which was normal 2.44. Pt has h/o graves disease with subsequent hypothyroidism. Synthroid refill for 3 months and f/u with Dr. Laney Pastor.   2. Mood disorder.  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 Pt takes paxil 40mg  qd. Per march visit plan to f/u with Dr. Laney Pastor to ween off this medication. No appointment noted in the system at this time.   3. Chronic back pain. In per prior note pt has had 6 back surgery takes neurontin for lumbar neuropathy. Refilled in march for neurontin 800 mg twice daily also takes ibuprofen 800 mg as needed.   4. Rash to left axillary. Seen for carbuncle here in March. Wound culture coag neg staff.   5 restless leg syndrome. Follow by Dr. Rexene Alberts last seen June 24th. Per her note considering dopamine agonist down the road but discussed compliance with Cpap specialist may help RLS symptoms. Compliance data for that visit was 50-60%. Trish Mage  was obtained which apparently was low per June 25th phone call with Dr. Rexene Alberts even though she was not anemic.    Today pt c/o erythematous rash to left axillary that began 1 Sisk ago with associated drainage of pus. Pt describes area to be sore with pressure. Pt has tried tea tree oil and cortisone without relief to rash. Pt has also tried heat. Pt reports h/o MRSA. She reports last tested positive for MRSA 6-8 months. Pt  reports past hospitalization due to MRSA.   Pt believes paxil medication, which she takes for anxiety is causing sleep disturbances and restless leg syndrome. She states she has random involuntary leg movement at times. Pt is requesting a generic brand of paxil. 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 Pt is requesting medication refills of of neurontin, synthroid and ibuprofen 800 mg.  Pt denies changes in thyroid.    Patient Active Problem List   Diagnosis Date Noted  . Herpes labialis 10/08/2013  . Lumbar radiculopathy, chronic 10/08/2013  . Nicotine addiction 07/30/2012  . Fall down stairs 05/27/2012  . Foot fracture 05/27/2012  . Obesity, Class III, BMI 40-49.9 (morbid obesity) 10/31/2011  . Mood disorder 10/31/2011  . Hypothyroidism 10/31/2011  . Obstructive sleep apnea 10/31/2011  . Narcotic abuse in remission   . Chronic back pain    Past Medical History  Diagnosis Date  . Narcotic abuse in remission   . Thyroid disease   . Chronic back pain   . Hypothyroidism   . Anxiety   . GERD (gastroesophageal reflux disease)   . Sleep apnea     cpap  . Headache(784.0)   . Neuromuscular disorder     neuropathy  . Arthritis     knees bilateral   DJD  stenosis   . Blood dyscrasia     HSV  . Cancer  vaginal  malignant carcinoma  . PCOS (polycystic ovarian syndrome)    Past Surgical History  Procedure Laterality Date  . Back surgery    . Cesarean section    . Tonsillectomy    . Tubal ligation    . Fracture surgery     Allergies  Allergen Reactions  . Avelox [Moxifloxacin Hcl In Nacl] Hives  . Levofloxacin Hives  . Penicillins Hives  . Quinolones Hives   Prior to Admission medications   Medication Sig Start Date End Date Taking? Authorizing Provider  gabapentin (NEURONTIN) 800 MG tablet Take 1 tablet (800 mg total) by mouth 2 (two) times daily. 10/08/13   Wardell Honour, MD  ibuprofen (ADVIL,MOTRIN) 800 MG tablet Take 1 tablet (800 mg total) by  mouth every 8 (eight) hours as needed for moderate pain. Take with food. 01/03/14   Mirna Mires, MD  levothyroxine (SYNTHROID, LEVOTHROID) 50 MCG tablet Take 1 tablet (50 mcg total) by mouth daily before breakfast. 10/08/13   Wardell Honour, MD  PARoxetine (PAXIL) 40 MG tablet Take 40 mg by mouth every morning.    Historical Provider, MD   History   Social History  . Marital Status: Divorced    Spouse Name: N/A    Number of Children: N/A  . Years of Education: N/A   Occupational History  . disability     RN; Psychologist, occupational at substance abuse counselor at [employer] History Main Topics  . Smoking status: Former Smoker -- 0.50 packs/[place] for 20 years    Types: Cigarettes  . Smokeless tobacco: Never Used  . Alcohol Use: No     Comment: heavy  . Drug Use: No     Comment: former narcotic abuse  . Sexual Activity: Yes    Birth Control/ Protection: Surgical   Other Topics Concern  . Not on file   Social History Narrative  . No narrative on file   Review of Systems  Skin: Positive for color change and rash. Negative for pallor and wound.       Objective:   Physical Exam  Nursing note and vitals reviewed. Constitutional: She is oriented to person, place, and time. She appears well-developed and well-nourished. No distress.  HENT:  Head: Normocephalic and atraumatic.  Eyes: Conjunctivae and EOM are normal.  Neck: Neck supple.  Cardiovascular: Normal rate, regular rhythm, normal heart sounds and intact distal pulses.  Exam reveals no gallop and no friction rub.   No murmur heard. Pulmonary/Chest: Effort normal and breath sounds normal. No respiratory distress. She has no wheezes. She has no rales. She exhibits no tenderness.  Musculoskeletal: Normal range of motion.  Neurological: She is alert and oriented to person, place, and time.  Skin: Skin is warm and dry.  15 x 9 cm area of erythema just below left axilla/chest wall. 1cm raw area centrally with induration of  approximately 7 cm. Dry exudate at opening or area. Minimal central fluctuance. No apparent axilla adenopathy  or supra clavicular adenopathy.   Psychiatric: She has a normal mood and affect. Her behavior is normal.      Assessment & Plan:   [name] is a 38 y.o. female Cellulitis of chest wall - Plan: Wound culture, doxycycline (VIBRA-TABS) 100 MG tablet  - L sided, large area of erythema on exam. Wound cx obtained, packed, start doxy 100mg  BID, wound care as below, and recheck in 48 hrs.   Mood disorder - Plan: PARoxetine (PAXIL) 40 MG tablet  -  refilled at same dose for now and discussed need for follow up with Dr. Laney Pastor to decide on weaning of this medicine if appropriate and at what level. Discussed need for follow up in next 2 months. (appt or walk in), as follow up with him was also recommended by Dr. Tamala Julian in March.   Unspecified hypothyroidism - Plan: levothyroxine (SYNTHROID, LEVOTHROID) 50 MCG tablet  - most recent TSH ok.  Refilled synthroid for next 2 months - follow up with PCP as above.   Chronic radicular low back pain - Plan: gabapentin (NEURONTIN) 800 MG tablet, ibuprofen (ADVIL,MOTRIN) 800 MG tablet  - stable - refilled meds until follow up with PCP.   Restless leg syndrome, Periodic limb movement disorder, OSA (obstructive sleep apnea)  - low ferritin by Dr. Guadelupe Sabin notes, and compliance with CPAp as initial treatment per notes in [facility].  Per patient - she called their office yesterday and it was discussed to go ahead and start requip, but not noted in system, and has only been on iron  Supplementation recently. Advised pt to have Dr. Guadelupe Sabin office send [place] a note if they indeed do want her to start Requip, but can also discuss this further with Dr. Laney Pastor.    Meds ordered this encounter  Medications  . gabapentin (NEURONTIN) 800 MG tablet    Sig: Take 1 tablet (800 mg total) by mouth 2 (two) times daily.    Dispense:  60 tablet    Refill:  1  . levothyroxine  (SYNTHROID, LEVOTHROID) 50 MCG tablet    Sig: Take 1 tablet (50 mcg total) by mouth daily before breakfast.    Dispense:  30 tablet    Refill:  1  . PARoxetine (PAXIL) 40 MG tablet    Sig: Take 1 tablet (40 mg total) by mouth every morning.    Dispense:  30 tablet    Refill:  1  . ibuprofen (ADVIL,MOTRIN) 800 MG tablet    Sig: Take 1 tablet (800 mg total) by mouth every 8 (eight) hours as needed for moderate pain. Take with food.    Dispense:  30 tablet    Refill:  0  . doxycycline (VIBRA-TABS) 100 MG tablet    Sig: Take 1 tablet (100 mg total) by mouth 2 (two) times daily.    Dispense:  20 tablet    Refill:  0   Patient Instructions  Regarding the restless legs and limb movements - I reviewed Dr. Guadelupe Sabin notes and it appears your ferritin was low, which may contribute to RLS symptoms, Iron supplement as she recommended. She also wanted to see if increased CPAP use will help RLS symptoms/limb movements. Dr. Rexene Alberts did recommend other initial treatments for the RLS to start, then to consider medicine like Requip if not helping.   I will relay the information about the Paxil question to Dr. Laney Pastor, but recommend you follow up with him to discuss weaning this. I refilled same dose for now.  I will send a copy of note from today to him.   I will refill your thyroid medication, neurontin and ibuprofen today with a refill,  but further refills need to be provided by Dr. Laney Pastor. I will put a message into our schedulers to try to arrange an appointment, but depending on his schedule, you may need to walk in to be seen by him at [address].    For the infection - start doxycycline. Warm compresses at least 4-5 times per Car and recheck in 2  days - you can ask for Chelle or Benjamine Mola for wound care follow up then.   Return to the clinic or go to the nearest emergency room if any of your symptoms worsen or new symptoms occur.  Cellulitis Cellulitis is an infection of the skin and the tissue  beneath it. The infected area is usually red and tender. Cellulitis occurs most often in the arms and lower legs.  CAUSES  Cellulitis is caused by bacteria that enter the skin through cracks or cuts in the skin. The most common types of bacteria that cause cellulitis are Staphylococcus and Streptococcus. SYMPTOMS   Redness and warmth.  Swelling.  Tenderness or pain.  Fever. DIAGNOSIS  Your caregiver can usually determine what is wrong based on a physical exam. Blood tests may also be done. TREATMENT  Treatment usually involves taking an antibiotic medicine. HOME CARE INSTRUCTIONS   Take your antibiotics as directed. Finish them even if you start to feel better.  Keep the infected arm or leg elevated to reduce swelling.  Apply a warm cloth to the affected area up to 4 times per [place] to relieve pain.  Only take over-the-counter or prescription medicines for pain, discomfort, or fever as directed by your caregiver.  Keep all follow-up appointments as directed by your caregiver. SEEK MEDICAL CARE IF:   You notice red streaks coming from the infected area.  Your red area gets larger or turns dark in color.  Your bone or joint underneath the infected area becomes painful after the skin has healed.  Your infection returns in the same area or another area.  You notice a swollen bump in the infected area.  You develop new symptoms. SEEK IMMEDIATE MEDICAL CARE IF:   You have a fever.  You feel very sleepy.  You develop vomiting or diarrhea.  You have a general ill feeling (malaise) with muscle aches and pains. MAKE SURE YOU:   Understand these instructions.  Will watch your condition.  Will get help right away if you are not doing well or get worse. Document Released: 04/22/2005 Document Revised: 01/12/2012 Document Reviewed: 09/28/2011 [facility] Patient Information 2015 [place], [place]. This information is not intended to replace advice given to you by your health care  provider. Make sure you discuss any questions you have with your health care provider.     I personally performed the services described in this documentation, which was scribed in my presence. The recorded information has been reviewed and considered, and addended by me as needed.

## 2014-01-25 NOTE — Progress Notes (Signed)
Left a message for patient to return call to schedule an appointment with Dr, Laney Pastor in the next two months.

## 2014-01-26 ENCOUNTER — Encounter: Payer: Self-pay | Admitting: Family Medicine

## 2014-01-26 ENCOUNTER — Ambulatory Visit (INDEPENDENT_AMBULATORY_CARE_PROVIDER_SITE_OTHER): Payer: Medicare Other | Admitting: Physician Assistant

## 2014-01-26 VITALS — BP 130/78 | HR 85 | Temp 98.8°F | Resp 18 | Ht 66.0 in | Wt 320.0 lb

## 2014-01-26 DIAGNOSIS — L02419 Cutaneous abscess of limb, unspecified: Secondary | ICD-10-CM

## 2014-01-26 DIAGNOSIS — IMO0002 Reserved for concepts with insufficient information to code with codable children: Secondary | ICD-10-CM

## 2014-01-26 MED ORDER — CEFTRIAXONE SODIUM 1 G IJ SOLR
500.0000 mg | Freq: Once | INTRAMUSCULAR | Status: AC
  Administered 2014-01-26: 500 mg via INTRAMUSCULAR

## 2014-01-26 NOTE — Progress Notes (Signed)
   Subjective:    Patient ID: Larena Glassman Schnick, female    DOB: November 18, 1975, 38 y.o.   MRN: [id number]  HPI   Ms. Bozzo is a very pleasant 38 yr old female here for wound care following I&D here 48 hours ago.  She reports that she is still in significant pain - actually worse than it was initially.  She is changing the dressing regularly.  The area continues to drain.  She is taking the doxycycline as directed.  She feels that she may need a rocephin injection.      Review of Systems  Constitutional: Negative for fever and chills.  Respiratory: Negative.   Gastrointestinal: Negative for nausea and vomiting.  Skin: Positive for wound.       Objective:   Physical Exam  Vitals reviewed. Constitutional: She is oriented to person, place, and time. She appears well-developed and well-nourished. No distress.  HENT:  Head: Normocephalic and atraumatic.  Eyes: Conjunctivae are normal. No scleral icterus.  Pulmonary/Chest: Effort normal.  Neurological: She is alert and oriented to person, place, and time.  Skin: Skin is warm and dry.  Wound at LEFT axilla; approx 3-4 cm surrounding cellulitis that ist TTP; significant necrotic tissue adherent to wound edges  Psychiatric: She has a normal mood and affect. Her behavior is normal.    Wound Care: Dressing removed.  Packing not in place.  Local anesthesia with 2cc 2% plain lidocaine.  Necrotic tissue debrided with forceps and dermal curette down to beefy red granulation tissue.  Packed with 1/4" plain packing.  Non-stick dressing applied.  Pt tolerated very well.,      Assessment & Plan:  Axillary abscess - Plan: cefTRIAXone (ROCEPHIN) injection 500 mg   Ms. Sarate is a pleasant 38 yr old female here for wound care following I&D.  Wound care as above.  Wound was packed today.  Pt requests to remove her own packing - I think this is fine and instructed her to do so in 48 hours.  Follow up if not improving.  Continue daily dressing changes.  Continue  doxy.  Rocephin IM today.    Pt to call or RTC if worsening or not improving  E. Natividad Brood MHS, PA-C [facility] 7/3/20158:52 AM

## 2014-01-26 NOTE — Progress Notes (Signed)
Spoke with patient and she does not want to wait until your first available appointment on 9/16.  She would like to lower the dosage of her paxil so she will come to the walk in clinic to see you.

## 2014-01-27 LAB — WOUND CULTURE
Gram Stain: NONE SEEN
Gram Stain: NONE SEEN

## 2014-02-14 ENCOUNTER — Ambulatory Visit (INDEPENDENT_AMBULATORY_CARE_PROVIDER_SITE_OTHER): Payer: Medicare Other | Admitting: Emergency Medicine

## 2014-02-14 VITALS — BP 118/80 | HR 104 | Temp 99.4°F | Resp 16 | Ht 66.0 in | Wt 318.0 lb

## 2014-02-14 DIAGNOSIS — IMO0002 Reserved for concepts with insufficient information to code with codable children: Secondary | ICD-10-CM

## 2014-02-14 DIAGNOSIS — M5416 Radiculopathy, lumbar region: Principal | ICD-10-CM

## 2014-02-14 DIAGNOSIS — G8929 Other chronic pain: Secondary | ICD-10-CM

## 2014-02-14 MED ORDER — GABAPENTIN 800 MG PO TABS: 800.0000 mg | ORAL_TABLET | Freq: Three times a day (TID) | ORAL | Status: DC

## 2014-02-14 MED ORDER — GABAPENTIN 800 MG PO TABS
800.0000 mg | ORAL_TABLET | Freq: Three times a day (TID) | ORAL | Status: DC
Start: 2014-02-14 — End: 2014-02-19

## 2014-02-14 NOTE — Progress Notes (Signed)
   Subjective:    Patient ID: [name], female    DOB: [birth year], 38 y.o.   MRN: [id number]  HPI patient currently in recovery. She has just finished almost 2 years at [employer]. She currently has joint custody of her daughter. She currently is on Neurontin and milligrams twice a [place]. Her last prescription was filled 7-10 days ago and apparently it was lost with her recent move. She has had mild nausea but no other real symptoms    Review of Systems     Objective:   Physical Exam patient is alert and cooperative. Neck is supple chest is clear heart regular rate       Assessment & Plan:  She has given #20 gabapentin 800 mg which was sent to the pharmacy. She would return to talk to Dr. Laney Pastor Monday night to discuss further management of her chronic pain . She did request Robaxin which I declined

## 2014-02-19 ENCOUNTER — Ambulatory Visit (INDEPENDENT_AMBULATORY_CARE_PROVIDER_SITE_OTHER): Payer: Medicare Other | Admitting: Internal Medicine

## 2014-02-19 VITALS — BP 130/90 | HR 106 | Temp 98.9°F | Resp 16 | Ht 67.0 in | Wt 321.0 lb

## 2014-02-19 DIAGNOSIS — M5416 Radiculopathy, lumbar region: Principal | ICD-10-CM

## 2014-02-19 DIAGNOSIS — F1910 Other psychoactive substance abuse, uncomplicated: Secondary | ICD-10-CM

## 2014-02-19 DIAGNOSIS — G8929 Other chronic pain: Secondary | ICD-10-CM

## 2014-02-19 DIAGNOSIS — N926 Irregular menstruation, unspecified: Secondary | ICD-10-CM

## 2014-02-19 DIAGNOSIS — IMO0002 Reserved for concepts with insufficient information to code with codable children: Secondary | ICD-10-CM

## 2014-02-19 LAB — POCT URINE PREGNANCY: Preg Test, Ur: NEGATIVE

## 2014-02-19 MED ORDER — METHOCARBAMOL 500 MG PO TABS: 500.0000 mg | ORAL_TABLET | Freq: Three times a day (TID) | ORAL | Status: DC | PRN

## 2014-02-19 MED ORDER — GABAPENTIN 800 MG PO TABS: ORAL_TABLET | ORAL | Status: DC

## 2014-02-19 NOTE — Progress Notes (Signed)
Subjective:  This chart was scribed for Leandrew Koyanagi, MD by Ladene Artist, ED Scribe. The patient was seen in room 8. Patient's care was started at 4:17 PM.   Patient ID: [name], female    DOB: [birth year], 38 y.o.   MRN: [id number]  Chief Complaint  Patient presents with  . Medication Refill    neurontin, robaxin  . Possible Pregnancy    no menstral period in 2 months   HPI HPI Comments: [name] is a 38 y.o. female who presents to the [facility] for a medication refill of Neurontin and Robaxin PM. Pt states that she was seen by Dr. Everlene Farrier last week for a few days of Neurontin. She returns today for a refill but states that she would like to increase Neurontin. Pt reports ibuprofen use but denies any other medication use at this time for pain.   Pt also reports concerns of missed menstrual period over the past 2 months. Pt suspects that missed periods are attributed to family history of early menopausal symptoms.   Pt recently graduated from the [employer] 2 yrs now. She lives in the same apartment complex with 4 other graduates. She states that she has everything back in her life that she once lost. She states that her parents are active in her life and she has joint custody of her daughter. 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   Past Medical History  Diagnosis Date  . Narcotic abuse in remission   . Thyroid disease   . Chronic back pain   . Hypothyroidism   . Anxiety   . GERD (gastroesophageal reflux disease)   . Sleep apnea     cpap  . Headache(784.0)   . Neuromuscular disorder     neuropathy  . Arthritis     knees bilateral   DJD  stenosis   . Blood dyscrasia     HSV  . Cancer     vaginal  malignant carcinoma  . PCOS (polycystic ovarian syndrome)    Current Outpatient Prescriptions on File Prior to Visit  Medication Sig Dispense Refill  . gabapentin (NEURONTIN) 800 MG tablet Take  1 tablet (800 mg total) by mouth 3 (three) times daily.  20 tablet  0  . ibuprofen (ADVIL,MOTRIN) 800 MG tablet Take 1 tablet (800 mg total) by mouth every 8 (eight) hours as needed for moderate pain. Take with food.  30 tablet  0  . levothyroxine (SYNTHROID, LEVOTHROID) 50 MCG tablet Take 1 tablet (50 mcg total) by mouth daily before breakfast.  30 tablet  1  . PARoxetine (PAXIL) 40 MG tablet Take 1 tablet (40 mg total) by mouth every morning.  30 tablet  1   Current Facility-Administered Medications on File Prior to Visit  Medication Dose Route Frequency Provider Last Rate Last Dose  . influenza vac split quadrivalent PF (FLUARIX) injection 0.5 mL  0.5 mL Intramuscular Tomorrow-1000 Robyn Haber, MD       Allergies  Allergen Reactions  . Avelox [Moxifloxacin Hcl In Nacl] Hives  . Levofloxacin Hives  . Penicillins Hives  . Quinolones Hives   Review of Systems  Constitutional: Negative.       Objective:   Physical Exam  Nursing note and vitals reviewed. Constitutional: She is oriented to person, place, and time. She appears well-developed and well-nourished.  HENT:  Head: Normocephalic and atraumatic.  Eyes: Conjunctivae and EOM are normal.  Neck: Neck supple.  Musculoskeletal: Normal range of motion.  Neurological: She is alert and oriented to person, place, and time.  Skin: Skin is warm and dry.  Psychiatric: She has a normal mood and affect. Her behavior is normal.      Assessment & Plan:   I personally performed the services described in this documentation, which was scribed in my presence. The recorded information has been reviewed and is accurate.RPD  Chronic radicular low back pain - Plan: gabapentin (NEURONTIN) 800 MG tablet  Missed periods - Plan: POCT urine pregnancy  Substance abuse-now stable  Meds ordered this encounter  Medications  . gabapentin (NEURONTIN) 800 MG tablet    Sig: 1 am, 81midday and 2 hs    Dispense:  120 tablet    Refill:  5  .  methocarbamol (ROBAXIN) 500 MG tablet    Sig: Take 1 tablet (500 mg total) by mouth every 8 (eight) hours as needed for muscle spasms.    Dispense:  60 tablet    Refill:  3   Will call in 3-4 weeks to ? incr gaba to 3600mg

## 2014-02-20 NOTE — Progress Notes (Signed)
Mailed letter to patient

## 2014-03-01 DIAGNOSIS — Z0271 Encounter for disability determination: Secondary | ICD-10-CM

## 2014-03-05 ENCOUNTER — Telehealth: Payer: Self-pay

## 2014-03-05 DIAGNOSIS — G8929 Other chronic pain: Secondary | ICD-10-CM

## 2014-03-05 DIAGNOSIS — M5416 Radiculopathy, lumbar region: Principal | ICD-10-CM

## 2014-03-05 NOTE — Telephone Encounter (Signed)
Patient called once more regarding medication. Advised her that Dr. Laney Pastor did reply to her initial request and simply advised her that she was unable to take those medications at this time. Please return call and go into further detail with her. Thank you.

## 2014-03-05 NOTE — Telephone Encounter (Signed)
PT STATES SHE HAVE PULLED HER BACK OUT AND CANNOT AFFORD TO COME IN NOR DOES SHE HAVE A WAY WOULD LIKE DR DOOLITTLE TO CALL HER SOME TRAMADOL AND FLEXERIL. PLEASE CALL [phone]    [facility] [address]

## 2014-03-05 NOTE — Telephone Encounter (Signed)
Pt has been taking Neurotin and Robaxin and pt would like to try some Tramadol to help with her increase in pain.

## 2014-03-05 NOTE — Telephone Encounter (Signed)
She is not eligible to take this as it is controlled and possibly addictive

## 2014-03-05 NOTE — Telephone Encounter (Signed)
Patient has called again, I advised to allow 24-48 hours for a returned call

## 2014-03-06 NOTE — Telephone Encounter (Signed)
Lm for rtn call 

## 2014-03-17 NOTE — Telephone Encounter (Signed)
Patient would like to know if Dr. Laney Pastor can increase her Neurotin due to back pain. Cb# [id number].

## 2014-03-19 MED ORDER — GABAPENTIN 600 MG PO TABS: ORAL_TABLET | ORAL | Status: DC

## 2014-03-19 NOTE — Telephone Encounter (Signed)
Pt has been advised.

## 2014-03-19 NOTE — Telephone Encounter (Signed)
Meds ordered this encounter  Medications  . gabapentin (NEURONTIN) 600 MG tablet    Sig: 2  Tid (3600mg  daily)    Dispense:  180 tablet    Refill:  5

## 2014-03-26 ENCOUNTER — Encounter (HOSPITAL_COMMUNITY): Payer: Self-pay | Admitting: Emergency Medicine

## 2014-03-26 ENCOUNTER — Emergency Department (HOSPITAL_COMMUNITY)
Admission: EM | Admit: 2014-03-26 | Discharge: 2014-03-26 | Disposition: A | Discharge status: Home / Self Care | Payer: Medicare Other | Attending: Emergency Medicine | Admitting: Emergency Medicine

## 2014-03-26 ENCOUNTER — Emergency Department (HOSPITAL_COMMUNITY): Payer: Medicare Other

## 2014-03-26 DIAGNOSIS — M549 Dorsalgia, unspecified: Secondary | ICD-10-CM | POA: Diagnosis not present

## 2014-03-26 DIAGNOSIS — Z8544 Personal history of malignant neoplasm of other female genital organs: Secondary | ICD-10-CM | POA: Diagnosis not present

## 2014-03-26 DIAGNOSIS — Z880 Allergy status to penicillin: Secondary | ICD-10-CM | POA: Insufficient documentation

## 2014-03-26 DIAGNOSIS — Z862 Personal history of diseases of the blood and blood-forming organs and certain disorders involving the immune mechanism: Secondary | ICD-10-CM | POA: Insufficient documentation

## 2014-03-26 DIAGNOSIS — Z9981 Dependence on supplemental oxygen: Secondary | ICD-10-CM | POA: Insufficient documentation

## 2014-03-26 DIAGNOSIS — F17200 Nicotine dependence, unspecified, uncomplicated: Secondary | ICD-10-CM | POA: Insufficient documentation

## 2014-03-26 DIAGNOSIS — Z8719 Personal history of other diseases of the digestive system: Secondary | ICD-10-CM | POA: Insufficient documentation

## 2014-03-26 DIAGNOSIS — J069 Acute upper respiratory infection, unspecified: Secondary | ICD-10-CM | POA: Diagnosis not present

## 2014-03-26 DIAGNOSIS — G8929 Other chronic pain: Secondary | ICD-10-CM | POA: Insufficient documentation

## 2014-03-26 DIAGNOSIS — Z79899 Other long term (current) drug therapy: Secondary | ICD-10-CM | POA: Diagnosis not present

## 2014-03-26 DIAGNOSIS — F411 Generalized anxiety disorder: Secondary | ICD-10-CM | POA: Diagnosis not present

## 2014-03-26 DIAGNOSIS — G4730 Sleep apnea, unspecified: Secondary | ICD-10-CM | POA: Diagnosis not present

## 2014-03-26 DIAGNOSIS — E039 Hypothyroidism, unspecified: Secondary | ICD-10-CM | POA: Diagnosis not present

## 2014-03-26 DIAGNOSIS — E079 Disorder of thyroid, unspecified: Secondary | ICD-10-CM | POA: Diagnosis not present

## 2014-03-26 DIAGNOSIS — R079 Chest pain, unspecified: Secondary | ICD-10-CM | POA: Insufficient documentation

## 2014-03-26 DIAGNOSIS — R000 Tachycardia, unspecified: Secondary | ICD-10-CM | POA: Insufficient documentation

## 2014-03-26 DIAGNOSIS — M5489 Other dorsalgia: Secondary | ICD-10-CM

## 2014-03-26 LAB — COMPREHENSIVE METABOLIC PANEL
AST: 21 U/L (ref 0–37)
Albumin: 3.4 g/dL — ABNORMAL LOW (ref 3.5–5.2)
Alkaline Phosphatase: 58 U/L (ref 39–117)
Anion gap: 16 — ABNORMAL HIGH (ref 5–15)
BUN: 15 mg/dL (ref 6–23)
CALCIUM: 8.5 mg/dL (ref 8.4–10.5)
CO2: 20 meq/L (ref 19–32)
CREATININE: 0.9 mg/dL (ref 0.50–1.10)
Chloride: 99 mEq/L (ref 96–112)
GFR calc Af Amer: >90 mL/min (ref >90)
GFR, EST NON AFRICAN AMERICAN: 80 mL/min — AB (ref >90)
Glucose, Bld: 125 mg/dL — ABNORMAL HIGH (ref 70–99)
Potassium: 4.2 mEq/L (ref 3.7–5.3)
SODIUM: 135 meq/L — AB (ref 137–147)
Total Bilirubin: <0.2 mg/dL — ABNORMAL LOW (ref 0.3–1.2)
Total Protein: 6.6 g/dL (ref 6.0–8.3)

## 2014-03-26 LAB — MAGNESIUM: Magnesium: 2.1 mg/dL (ref 1.5–2.5)

## 2014-03-26 LAB — CBC
HCT: 38.9 % (ref 36.0–46.0)
Hemoglobin: 13.7 g/dL (ref 12.0–15.0)
MCH: 32.3 pg (ref 26.0–34.0)
MCHC: 35.2 g/dL (ref 30.0–36.0)
MCV: 91.7 fL (ref 78.0–100.0)
Platelets: 214 10*3/uL (ref 150–400)
RBC: 4.24 MIL/uL (ref 3.87–5.11)
RDW: 13.4 % (ref 11.5–15.5)
WBC: 8.6 10*3/uL (ref 4.0–10.5)

## 2014-03-26 LAB — PRO B NATRIURETIC PEPTIDE: PRO B NATRI PEPTIDE: 15.7 pg/mL (ref 0–125)

## 2014-03-26 LAB — TROPONIN I

## 2014-03-26 LAB — PROTIME-INR
INR: 0.89 (ref 0.00–1.49)
PROTHROMBIN TIME: 12.1 s (ref 11.6–15.2)

## 2014-03-26 MED ORDER — HYDROCODONE-HOMATROPINE 5-1.5 MG/5ML PO SYRP: 5.0000 mL | ORAL_SOLUTION | Freq: Four times a day (QID) | ORAL | Status: DC | PRN

## 2014-03-26 MED ORDER — ASPIRIN 81 MG PO CHEW: 324.0000 mg | CHEWABLE_TABLET | Freq: Once | ORAL | Status: DC

## 2014-03-26 MED ORDER — HYDROCODONE-ACETAMINOPHEN 5-325 MG PO TABS
1.0000 | ORAL_TABLET | Freq: Once | ORAL | Status: AC
  Administered 2014-03-26: 1 via ORAL
  Filled 2014-03-26: qty 1

## 2014-03-26 MED ORDER — PREDNISONE 20 MG PO TABS: 60.0000 mg | ORAL_TABLET | Freq: Every day | ORAL | Status: AC

## 2014-03-26 MED ORDER — KETOROLAC TROMETHAMINE 30 MG/ML IJ SOLN
30.0000 mg | Freq: Once | INTRAMUSCULAR | Status: AC
  Administered 2014-03-26: 30 mg via INTRAVENOUS
  Filled 2014-03-26: qty 1

## 2014-03-26 MED ORDER — TRAMADOL HCL 50 MG PO TABS
50.0000 mg | ORAL_TABLET | Freq: Once | ORAL | Status: AC
  Administered 2014-03-26: 50 mg via ORAL
  Filled 2014-03-26: qty 1

## 2014-03-26 MED ORDER — SODIUM CHLORIDE 0.9 % IV BOLUS (SEPSIS)
1000.0000 mL | Freq: Once | INTRAVENOUS | Status: AC
  Administered 2014-03-26: 1000 mL via INTRAVENOUS

## 2014-03-26 NOTE — ED Provider Notes (Addendum)
CSN: [id number]     Arrival date & time 03/26/14  1922 History   First MD Initiated Contact with Patient 03/26/14 1931     Chief Complaint  Patient presents with  . Chest Pain  . Palpitations     HPI  Patient presents with multiple complaints.  She states that over the past Bernat she has had substantial amounts of cough, congestion, palpitations, as well as ongoing worsening of her chronic back pain. Patient's complaints of anxiety over the constellation of symptoms. She denies focal chest pain, syncope, lightheadedness. No relief with OTC medication.   Past Medical History  Diagnosis Date  . Narcotic abuse in remission   . Thyroid disease   . Chronic back pain   . Hypothyroidism   . Anxiety   . GERD (gastroesophageal reflux disease)   . Sleep apnea     cpap  . Headache(784.0)   . Neuromuscular disorder     neuropathy  . Arthritis     knees bilateral   DJD  stenosis   . Blood dyscrasia     HSV  . Cancer     vaginal  malignant carcinoma  . PCOS (polycystic ovarian syndrome)    Past Surgical History  Procedure Laterality Date  . Back surgery    . Cesarean section    . Tonsillectomy    . Tubal ligation    . Fracture surgery     Family History  Problem Relation Age of Onset  . Arthritis Mother   . Clotting disorder Mother   . Hyperlipidemia Father   . Leukemia Maternal Grandmother   . Diabetes Maternal Grandfather   . Stroke Paternal Grandmother   . Heart disease Father   . Diabetes Mother   . Hypertension Mother   . Prostate cancer Father   . Heart disease Paternal Grandmother    History  Substance Use Topics  . Smoking status: Current Every Abram Smoker -- 0.50 packs/[place] for 20 years    Types: Cigarettes  . Smokeless tobacco: Never Used  . Alcohol Use: No     Comment: heavy   OB History   Grav Para Term Preterm Abortions TAB SAB Ect Mult Living                 Review of Systems  Constitutional:       Per HPI, otherwise negative  HENT:       Per  HPI, otherwise negative  Respiratory:       Per HPI, otherwise negative  Cardiovascular:       Per HPI, otherwise negative  Gastrointestinal: Negative for vomiting.  Endocrine:       Negative aside from HPI  Genitourinary:       Neg aside from HPI   Musculoskeletal:       Per HPI, otherwise negative  Skin: Negative.   Neurological: Negative for syncope.      Allergies  Avelox; Levofloxacin; Penicillins; and Quinolones  Home Medications   Prior to Admission medications   Medication Sig Start Date End Date Taking? Authorizing Provider  gabapentin (NEURONTIN) 600 MG tablet Take 1,200 mg by mouth 4 (four) times daily.   Yes Historical Provider, MD  ibuprofen (ADVIL,MOTRIN) 800 MG tablet Take 1 tablet (800 mg total) by mouth every 8 (eight) hours as needed for moderate pain. Take with food. 01/24/14  Yes Wendie Agreste, MD  levothyroxine (SYNTHROID, LEVOTHROID) 50 MCG tablet Take 1 tablet (50 mcg total) by mouth daily before breakfast. 01/24/14  Yes Wendie Agreste, MD  PARoxetine (PAXIL) 40 MG tablet Take 1 tablet (40 mg total) by mouth every morning. 01/24/14  Yes Wendie Agreste, MD  HYDROcodone-homatropine [facility]) 5-1.5 MG/5ML syrup Take 5 mLs by mouth every 6 (six) hours as needed for cough. 03/26/14   Carmin Muskrat, MD  predniSONE (DELTASONE) 20 MG tablet Take 3 tablets (60 mg total) by mouth daily. 03/27/14 03/30/14  Carmin Muskrat, MD   BP 127/94  Pulse 96  Temp(Src) 99.4 F (37.4 C) (Oral)  Resp 16  SpO2 98%  LMP 03/12/2014 Physical Exam  Nursing note and vitals reviewed. Constitutional: She is oriented to person, place, and time. She appears well-developed and well-nourished. No distress.  HENT:  Head: Normocephalic and atraumatic.  Eyes: Conjunctivae and EOM are normal.  Cardiovascular: Regular rhythm.  Tachycardia present.   Pulmonary/Chest: Effort normal and breath sounds normal. No stridor. No respiratory distress.  Abdominal: She exhibits no distension.   Musculoskeletal: She exhibits no edema.  Neurological: She is alert and oriented to person, place, and time. No cranial nerve deficit.  Skin: Skin is warm and dry.  Psychiatric: Her mood appears anxious.    ED Course  Procedures (including critical care time) Labs Review Labs Reviewed  COMPREHENSIVE METABOLIC PANEL - Abnormal; Notable for the following:    Sodium 135 (*)    Glucose, Bld 125 (*)    Albumin 3.4 (*)    Total Bilirubin <0.2 (*)    GFR calc non Af Amer 80 (*)    Anion gap 16 (*)    All other components within normal limits  CBC  PRO B NATRIURETIC PEPTIDE  MAGNESIUM  PROTIME-INR  TROPONIN I    Imaging Review Dg Chest 2 View  03/26/2014   CLINICAL DATA:  Short of breath since yesterday.  Cough and fever.  EXAM: CHEST  2 VIEW  COMPARISON:  12/18/2013.  FINDINGS: The heart size and mediastinal contours are within normal limits. Both lungs are clear. The visualized skeletal structures are unremarkable.  IMPRESSION: No active cardiopulmonary disease.   Electronically Signed   By: Lajean Manes M.D.   On: 03/26/2014 20:33   On exam the patient is in no distress, tachycardia has improved, heart rate in the 90s.   EKG shows a sinus tachycardia, rate 113, abnormal  MDM   Final diagnoses:  URI (upper respiratory infection)  Midline back pain, unspecified location    Patient presents with URI like symptoms, as well as back pain. On initial exam the patient is anxious, tachycardic. Patient chest pain, there is low suspicion for ongoing coronary ischemia. Patient is neurologically intact, and has a documented history of chronic back pain. Is received analgesics with improvement in her condition, was discharged with medication for her congestion, likely URI versus respiratory illness. Patient states that she will follow up with primary care. She was provided resources to assist in this pursuit.     Carmin Muskrat, MD 03/26/14 2993  Carmin Muskrat, MD 03/26/14  [phone]

## 2014-03-26 NOTE — ED Notes (Signed)
Pt presents to ED via EMS with c/o center chest pain that radiates to back, onset today. Pt reports chronic chest pain for years but worse today. Pt also reports cough with yellow sputum and she thinks she might have pulled a muscle while coughing. Pt also reports body ache, weakness, diarrhea, tachycardia, and palpitations. Per EMS, BP-108/76, O2-96% on room air, given 324mg  ASA, 4mg  Zofran. Pt rate pain at 10/10 at this time.

## 2014-03-26 NOTE — ED Notes (Signed)
Dr. Vanita Panda at bedside discussing plan of care with pt.

## 2014-03-26 NOTE — Discharge Instructions (Signed)
As discussed, your evaluation today has been largely reassuring.  But, it is important that you monitor your condition carefully, and do not hesitate to return to the ED if you develop new, or concerning changes in your condition.  You are receiving medication for her back pain and your cough and congestion, which is likely due to viral infection and/or bronchitis.  Otherwise, please follow-up with your physician for appropriate ongoing care.

## 2014-04-02 ENCOUNTER — Other Ambulatory Visit: Payer: Self-pay | Admitting: Family Medicine

## 2014-04-02 ENCOUNTER — Encounter (HOSPITAL_COMMUNITY): Payer: Self-pay | Admitting: Emergency Medicine

## 2014-04-02 ENCOUNTER — Emergency Department (EMERGENCY_DEPARTMENT_HOSPITAL)
Admission: EM | Admit: 2014-04-02 | Discharge: 2014-04-03 | Disposition: A | Discharge status: Other Acute Inpatient Hospital | Payer: Medicare Other | Source: Home / Self Care | Attending: Emergency Medicine | Admitting: Emergency Medicine

## 2014-04-02 DIAGNOSIS — Z8544 Personal history of malignant neoplasm of other female genital organs: Secondary | ICD-10-CM | POA: Insufficient documentation

## 2014-04-02 DIAGNOSIS — F10988 Alcohol use, unspecified with other alcohol-induced disorder: Principal | ICD-10-CM | POA: Insufficient documentation

## 2014-04-02 DIAGNOSIS — F10220 Alcohol dependence with intoxication, uncomplicated: Secondary | ICD-10-CM

## 2014-04-02 DIAGNOSIS — F321 Major depressive disorder, single episode, moderate: Secondary | ICD-10-CM | POA: Diagnosis not present

## 2014-04-02 DIAGNOSIS — F17200 Nicotine dependence, unspecified, uncomplicated: Secondary | ICD-10-CM

## 2014-04-02 DIAGNOSIS — G8929 Other chronic pain: Secondary | ICD-10-CM | POA: Insufficient documentation

## 2014-04-02 DIAGNOSIS — E039 Hypothyroidism, unspecified: Secondary | ICD-10-CM | POA: Insufficient documentation

## 2014-04-02 DIAGNOSIS — R000 Tachycardia, unspecified: Secondary | ICD-10-CM

## 2014-04-02 DIAGNOSIS — F1010 Alcohol abuse, uncomplicated: Secondary | ICD-10-CM

## 2014-04-02 DIAGNOSIS — F1994 Other psychoactive substance use, unspecified with psychoactive substance-induced mood disorder: Secondary | ICD-10-CM

## 2014-04-02 DIAGNOSIS — Z8719 Personal history of other diseases of the digestive system: Secondary | ICD-10-CM | POA: Insufficient documentation

## 2014-04-02 DIAGNOSIS — Z9981 Dependence on supplemental oxygen: Secondary | ICD-10-CM

## 2014-04-02 DIAGNOSIS — Z880 Allergy status to penicillin: Secondary | ICD-10-CM

## 2014-04-02 DIAGNOSIS — F39 Unspecified mood [affective] disorder: Secondary | ICD-10-CM | POA: Diagnosis present

## 2014-04-02 DIAGNOSIS — Z7189 Other specified counseling: Secondary | ICD-10-CM

## 2014-04-02 DIAGNOSIS — Z79899 Other long term (current) drug therapy: Secondary | ICD-10-CM | POA: Insufficient documentation

## 2014-04-02 DIAGNOSIS — F411 Generalized anxiety disorder: Secondary | ICD-10-CM | POA: Insufficient documentation

## 2014-04-02 DIAGNOSIS — F1020 Alcohol dependence, uncomplicated: Secondary | ICD-10-CM | POA: Diagnosis not present

## 2014-04-02 DIAGNOSIS — IMO0002 Reserved for concepts with insufficient information to code with codable children: Secondary | ICD-10-CM | POA: Insufficient documentation

## 2014-04-02 DIAGNOSIS — F10239 Alcohol dependence with withdrawal, unspecified: Secondary | ICD-10-CM | POA: Diagnosis present

## 2014-04-02 DIAGNOSIS — F10229 Alcohol dependence with intoxication, unspecified: Secondary | ICD-10-CM

## 2014-04-02 DIAGNOSIS — M1710 Unilateral primary osteoarthritis, unspecified knee: Secondary | ICD-10-CM

## 2014-04-02 DIAGNOSIS — Z7185 Encounter for immunization safety counseling: Secondary | ICD-10-CM

## 2014-04-02 DIAGNOSIS — G4730 Sleep apnea, unspecified: Secondary | ICD-10-CM

## 2014-04-02 LAB — CBC
HEMATOCRIT: 41 % (ref 36.0–46.0)
HEMOGLOBIN: 14.1 g/dL (ref 12.0–15.0)
MCH: 32 pg (ref 26.0–34.0)
MCHC: 34.4 g/dL (ref 30.0–36.0)
MCV: 93 fL (ref 78.0–100.0)
Platelets: 257 10*3/uL (ref 150–400)
RBC: 4.41 MIL/uL (ref 3.87–5.11)
RDW: 13.7 % (ref 11.5–15.5)
WBC: 10.4 10*3/uL (ref 4.0–10.5)

## 2014-04-02 LAB — RAPID URINE DRUG SCREEN, HOSP PERFORMED
Amphetamines: NOT DETECTED
Barbiturates: NOT DETECTED
Benzodiazepines: NOT DETECTED
COCAINE: NOT DETECTED
OPIATES: NOT DETECTED
Tetrahydrocannabinol: NOT DETECTED

## 2014-04-02 LAB — COMPREHENSIVE METABOLIC PANEL
ALBUMIN: 3.9 g/dL (ref 3.5–5.2)
ALT: 75 U/L — ABNORMAL HIGH (ref 0–35)
ANION GAP: 21 — AB (ref 5–15)
AST: 92 U/L — ABNORMAL HIGH (ref 0–37)
Alkaline Phosphatase: 65 U/L (ref 39–117)
BUN: 10 mg/dL (ref 6–23)
CO2: 22 mEq/L (ref 19–32)
Calcium: 9.7 mg/dL (ref 8.4–10.5)
Chloride: 95 mEq/L — ABNORMAL LOW (ref 96–112)
Creatinine, Ser: 0.94 mg/dL (ref 0.50–1.10)
GFR calc non Af Amer: 75 mL/min — ABNORMAL LOW (ref >90)
GFR, EST AFRICAN AMERICAN: 87 mL/min — AB (ref >90)
Glucose, Bld: 102 mg/dL — ABNORMAL HIGH (ref 70–99)
Potassium: 3.7 mEq/L (ref 3.7–5.3)
Sodium: 138 mEq/L (ref 137–147)
TOTAL PROTEIN: 7.6 g/dL (ref 6.0–8.3)
Total Bilirubin: 0.6 mg/dL (ref 0.3–1.2)

## 2014-04-02 LAB — ACETAMINOPHEN LEVEL: Acetaminophen (Tylenol), Serum: <15 ug/mL (ref 10–30)

## 2014-04-02 LAB — SALICYLATE LEVEL: Salicylate Lvl: <2 mg/dL — ABNORMAL LOW (ref 2.8–20.0)

## 2014-04-02 LAB — ETHANOL: Alcohol, Ethyl (B): 199 mg/dL — ABNORMAL HIGH (ref 0–11)

## 2014-04-02 MED ORDER — NICOTINE 21 MG/24HR TD PT24
21.0000 mg | MEDICATED_PATCH | Freq: Once | TRANSDERMAL | Status: DC
  Administered 2014-04-02: 21 mg via TRANSDERMAL
  Filled 2014-04-02: qty 1

## 2014-04-02 MED ORDER — ADULT MULTIVITAMIN W/MINERALS CH
1.0000 | ORAL_TABLET | Freq: Every day | ORAL | Status: DC
  Administered 2014-04-02 – 2014-04-03 (×2): 1 via ORAL
  Filled 2014-04-02 (×2): qty 1

## 2014-04-02 MED ORDER — SODIUM CHLORIDE 0.9 % IV BOLUS (SEPSIS)
1000.0000 mL | Freq: Once | INTRAVENOUS | Status: AC
  Administered 2014-04-02: 1000 mL via INTRAVENOUS

## 2014-04-02 MED ORDER — LORAZEPAM 1 MG PO TABS
1.0000 mg | ORAL_TABLET | Freq: Once | ORAL | Status: AC
  Administered 2014-04-02: 1 mg via ORAL
  Filled 2014-04-02: qty 1

## 2014-04-02 MED ORDER — LOPERAMIDE HCL 2 MG PO CAPS
4.0000 mg | ORAL_CAPSULE | Freq: Three times a day (TID) | ORAL | Status: DC | PRN
  Administered 2014-04-02 – 2014-04-03 (×2): 4 mg via ORAL
  Filled 2014-04-02 (×2): qty 2

## 2014-04-02 MED ORDER — DIAZEPAM 5 MG/ML IJ SOLN
10.0000 mg | Freq: Once | INTRAMUSCULAR | Status: AC
  Administered 2014-04-02: 10 mg via INTRAVENOUS
  Filled 2014-04-02: qty 2

## 2014-04-02 MED ORDER — PAROXETINE HCL 20 MG PO TABS
40.0000 mg | ORAL_TABLET | Freq: Every morning | ORAL | Status: DC
  Administered 2014-04-03: 40 mg via ORAL
  Filled 2014-04-02: qty 2

## 2014-04-02 MED ORDER — ZOLPIDEM TARTRATE 5 MG PO TABS
5.0000 mg | ORAL_TABLET | Freq: Every evening | ORAL | Status: DC | PRN
  Administered 2014-04-02: 5 mg via ORAL
  Filled 2014-04-02: qty 1

## 2014-04-02 MED ORDER — GABAPENTIN 600 MG PO TABS
1200.0000 mg | ORAL_TABLET | Freq: Three times a day (TID) | ORAL | Status: DC
  Filled 2014-04-02 (×2): qty 2

## 2014-04-02 MED ORDER — LEVOTHYROXINE SODIUM 50 MCG PO TABS
50.0000 ug | ORAL_TABLET | Freq: Every day | ORAL | Status: DC
  Administered 2014-04-03: 50 ug via ORAL
  Filled 2014-04-02 (×2): qty 1

## 2014-04-02 MED ORDER — ACETAMINOPHEN 325 MG PO TABS
650.0000 mg | ORAL_TABLET | Freq: Four times a day (QID) | ORAL | Status: DC | PRN
  Administered 2014-04-02 – 2014-04-03 (×2): 650 mg via ORAL
  Filled 2014-04-02 (×2): qty 2

## 2014-04-02 MED ORDER — VITAMIN B-1 100 MG PO TABS
100.0000 mg | ORAL_TABLET | Freq: Every day | ORAL | Status: DC
  Administered 2014-04-02 – 2014-04-03 (×2): 100 mg via ORAL
  Filled 2014-04-02 (×2): qty 1

## 2014-04-02 MED ORDER — IBUPROFEN 800 MG PO TABS
800.0000 mg | ORAL_TABLET | Freq: Three times a day (TID) | ORAL | Status: DC | PRN
  Administered 2014-04-02: 800 mg via ORAL
  Filled 2014-04-02: qty 1

## 2014-04-02 MED ORDER — INFLUENZA VAC SPLIT QUAD 0.5 ML IM SUSY
0.5000 mL | PREFILLED_SYRINGE | INTRAMUSCULAR | Status: AC
  Administered 2014-04-03: 0.5 mL via INTRAMUSCULAR
  Filled 2014-04-02 (×2): qty 0.5

## 2014-04-02 MED ORDER — FOLIC ACID 1 MG PO TABS
1.0000 mg | ORAL_TABLET | Freq: Every day | ORAL | Status: DC
  Administered 2014-04-02 – 2014-04-03 (×2): 1 mg via ORAL
  Filled 2014-04-02 (×2): qty 1

## 2014-04-02 MED ORDER — GABAPENTIN 300 MG PO CAPS
1200.0000 mg | ORAL_CAPSULE | Freq: Three times a day (TID) | ORAL | Status: DC
  Administered 2014-04-02 – 2014-04-03 (×3): 1200 mg via ORAL
  Filled 2014-04-02 (×2): qty 4

## 2014-04-02 MED ORDER — INFLUENZA VAC SPLIT QUAD 0.5 ML IM SUSP: 0.5000 mL | INTRAMUSCULAR | Status: DC

## 2014-04-02 MED ORDER — LORAZEPAM 1 MG PO TABS: 0.0000 mg | ORAL_TABLET | Freq: Two times a day (BID) | ORAL | Status: DC

## 2014-04-02 MED ORDER — THIAMINE HCL 100 MG/ML IJ SOLN: 100.0000 mg | Freq: Every day | INTRAMUSCULAR | Status: DC

## 2014-04-02 MED ORDER — LORAZEPAM 1 MG PO TABS
0.0000 mg | ORAL_TABLET | Freq: Four times a day (QID) | ORAL | Status: DC
  Administered 2014-04-02: 1 mg via ORAL
  Administered 2014-04-02 – 2014-04-03 (×3): 2 mg via ORAL
  Filled 2014-04-02: qty 1
  Filled 2014-04-02 (×3): qty 2

## 2014-04-02 NOTE — ED Notes (Signed)
Per Ems pt says she had a relapse yesterday and drank ETOH after being sober for undetermined amount of time. 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 Pt seeking detox.

## 2014-04-02 NOTE — ED Notes (Signed)
Pt here for detox and has been sober x 1 year and started drinking again 2 months ago. Pt is sweating and HR 140 in triage. Pt c/o of back pain from "6 surgeries in the past". Pt says "I feel like I am going to have a seizure."

## 2014-04-02 NOTE — ED Notes (Signed)
Pt resting more comfortably at this time, appears more calm, reports anxiety is better.  Pt given meal and tolerating well.  Med per [facility] for c/o back pain.  Denies further needs/complaints at this time.  NAD.

## 2014-04-02 NOTE — ED Notes (Signed)
Glasses at bedside.  Patient denies SI, HI, AVH. Rates feelings of anxiety 10/10, depression 8/10. Reports headache, backache, on going anxiety, shakiness, diarrhea, and clamminess. Feels 1659 and 1841 1mg  doses of Ativan have not helped with her symptoms. Patient appears clammy and restless.   Encouragement offered. Given Tylenol, Imodium. Fluids encouraged.  Q 15 safety checks continue.

## 2014-04-02 NOTE — ED Provider Notes (Signed)
CSN: [id number]     Arrival date & time 04/02/14  1302 History   First MD Initiated Contact with Patient 04/02/14 1322     Chief Complaint  Patient presents with  . ETOH detox      (Consider location/radiation/quality/duration/timing/severity/associated sxs/prior Treatment) HPI  59yF with etoh abuse/dependence. Sober for 1 year and relapsed 1 month ago. Last drink earlier today. Says she thinks she had a seizure last night. When asked what makes her think she did, she replied: "You just know these things." Woke up on ground and not sure how got there. No oral trauma. No incontinence. Denies prior seizure with withdrawal or otherwise.  Denies ingestion aside from etoh. Feels depressed, but denies SI or HI. No hallucinations.   Past Medical History  Diagnosis Date  . Narcotic abuse in remission   . Thyroid disease   . Chronic back pain   . Hypothyroidism   . Anxiety   . GERD (gastroesophageal reflux disease)   . Sleep apnea     cpap  . Headache(784.0)   . Neuromuscular disorder     neuropathy  . Arthritis     knees bilateral   DJD  stenosis   . Blood dyscrasia     HSV  . Cancer     vaginal  malignant carcinoma  . PCOS (polycystic ovarian syndrome)    Past Surgical History  Procedure Laterality Date  . Back surgery    . Cesarean section    . Tonsillectomy    . Tubal ligation    . Fracture surgery     Family History  Problem Relation Age of Onset  . Arthritis Mother   . Clotting disorder Mother   . Hyperlipidemia Father   . Leukemia Maternal Grandmother   . Diabetes Maternal Grandfather   . Stroke Paternal Grandmother   . Heart disease Father   . Diabetes Mother   . Hypertension Mother   . Prostate cancer Father   . Heart disease Paternal Grandmother    History  Substance Use Topics  . Smoking status: Current Every Jagielski Smoker -- 0.50 packs/[place] for 20 years    Types: Cigarettes  . Smokeless tobacco: Never Used  . Alcohol Use: Yes     Comment: heavy   OB  History   Grav Para Term Preterm Abortions TAB SAB Ect Mult Living                 Review of Systems  All systems reviewed and negative, other than as noted in HPI.   Allergies  Avelox; Levofloxacin; Penicillins; and Quinolones  Home Medications   Prior to Admission medications   Medication Sig Start Date End Date Taking? Authorizing Provider  gabapentin (NEURONTIN) 600 MG tablet Take 1,200 mg by mouth 4 (four) times daily.   Yes Historical Provider, MD  ibuprofen (ADVIL,MOTRIN) 800 MG tablet Take 1 tablet (800 mg total) by mouth every 8 (eight) hours as needed for moderate pain. Take with food. 01/24/14  Yes Wendie Agreste, MD  levothyroxine (SYNTHROID, LEVOTHROID) 50 MCG tablet Take 1 tablet (50 mcg total) by mouth daily before breakfast. 01/24/14  Yes Wendie Agreste, MD  PARoxetine (PAXIL) 40 MG tablet Take 1 tablet (40 mg total) by mouth every morning. 01/24/14  Yes Wendie Agreste, MD   BP 119/95  Pulse 115  Temp(Src) 98.3 F (36.8 C) (Oral)  Resp 17  SpO2 95%  LMP 03/12/2014 Physical Exam  Nursing note and vitals reviewed. Constitutional: She is oriented  to person, place, and time. She appears well-developed and well-nourished. No distress.  HENT:  Head: Normocephalic and atraumatic.  Eyes: Conjunctivae are normal. Right eye exhibits no discharge. Left eye exhibits no discharge.  Neck: Neck supple.  Cardiovascular: Regular rhythm and normal heart sounds.  Exam reveals no gallop and no friction rub.   No murmur heard. tachycardic  Pulmonary/Chest: Effort normal and breath sounds normal. No respiratory distress.  Abdominal: Soft. She exhibits no distension. There is no tenderness.  Musculoskeletal: She exhibits no edema and no tenderness.  Neurological: She is alert and oriented to person, place, and time.  Skin: Skin is warm and dry.  Psychiatric: She has a normal mood and affect. Her behavior is normal. Thought content normal.    ED Course  Procedures (including  critical care time) Labs Review Labs Reviewed  COMPREHENSIVE METABOLIC PANEL - Abnormal; Notable for the following:    Chloride 95 (*)    Glucose, Bld 102 (*)    AST 92 (*)    ALT 75 (*)    GFR calc non Af Amer 75 (*)    GFR calc Af Amer 87 (*)    Anion gap 21 (*)    All other components within normal limits  ETHANOL - Abnormal; Notable for the following:    Alcohol, Ethyl (B) 199 (*)    All other components within normal limits  SALICYLATE LEVEL - Abnormal; Notable for the following:    Salicylate Lvl <6.2 (*)    All other components within normal limits  ACETAMINOPHEN LEVEL  CBC  URINE RAPID DRUG SCREEN (HOSP PERFORMED)    Imaging Review No results found.   EKG Interpretation   Date/Time:  Monday April 02 2014 15:52:13 EDT Ventricular Rate:  110 PR Interval:  121 QRS Duration: 91 QT Interval:  342 QTC Calculation: 463 R Axis:   56 Text Interpretation:  Sinus tachycardia ED PHYSICIAN INTERPRETATION  AVAILABLE IN [facility] [place] Confirmed by TEST, Record ([id number]) on 04/04/2014  7:20:44 AM      MDM   Final diagnoses:  Alcohol dependence with uncomplicated intoxication        Virgel Manifold, MD 04/05/14 [phone]

## 2014-04-02 NOTE — ED Notes (Signed)
Pt. To [facility] from ED ambulatory without difficulty. Report from [facility]. Pt. Is alert and oriented, warm and dry in no distress. Pt. Denies SI, HI, and AVH. Pt. Calm and cooperative. Pt. Encouraged to let Nursing staff know if he has concerns or needs.

## 2014-04-02 NOTE — ED Notes (Signed)
Policy has been explained to pt, she has been wanded by security and items placed in a bag, 1 shirt, 1 pair of pants and 1 pair of shoes

## 2014-04-02 NOTE — ED Notes (Signed)
Pt a+ox4, changed to hospital gown, O2/Cardiac monitor. Pt reports anxiety and here for detox from alcohol. Pt states that "I am dehydrated." Pt states that abdominal cramping began yesterday. Pt is mildly diaphoretic, restless. Pt speaking in clear, full sentences.

## 2014-04-02 NOTE — Consult Note (Signed)
[facility] Face-to-Face Psychiatry Consult   Reason for Consult:  Alcohol dependence/detox Referring Physician:  EDP  Lizzie Eveland is an 38 y.o. female. Total Time spent with patient: 20 minutes  Assessment: AXIS I:  Alcohol Abuse and Substance Induced Mood Disorder AXIS II:  Deferred AXIS III:   Past Medical History  Diagnosis Date  . Narcotic abuse in remission   . Thyroid disease   . Chronic back pain   . Hypothyroidism   . Anxiety   . GERD (gastroesophageal reflux disease)   . Sleep apnea     cpap  . Headache(784.0)   . Neuromuscular disorder     neuropathy  . Arthritis     knees bilateral   DJD  stenosis   . Blood dyscrasia     HSV  . Cancer     vaginal  malignant carcinoma  . PCOS (polycystic ovarian syndrome)    AXIS IV:  other psychosocial or environmental problems, problems related to social environment and problems with primary support group AXIS V:  21-30 behavior considerably influenced by delusions or hallucinations OR serious impairment in judgment, communication OR inability to function in almost all areas  Plan:  Recommend psychiatric Inpatient admission when medically cleared.  Subjective:   Larena Glassman Haecker is a 38 y.o. female patient admitted with alcohol detox/dependence.  HPI:  Patient had been sober for almost a year but started back drinking for the past two months, up to a 1/2 gallon of liquor daily.  Denies drub abuse, hallucinations, and suicidal/homicidal ideations.  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  Her last rehab was a year ago at the [facility].  Last night, she had her first withdrawal seizure prior to coming here.  She takes Paxil 40 mg daily for depression from her PCP.  Larena Glassman shares custody of her 69 year old daughter with her ex-husband. HPI Elements:   Location:  generalized. Quality:  acute. Severity:  severe. Timing:  constant. Duration:  2 months. Context:   stressors.  Past Psychiatric History: Past Medical History  Diagnosis Date  . Narcotic abuse in remission   . Thyroid disease   . Chronic back pain   . Hypothyroidism   . Anxiety   . GERD (gastroesophageal reflux disease)   . Sleep apnea     cpap  . Headache(784.0)   . Neuromuscular disorder     neuropathy  . Arthritis     knees bilateral   DJD  stenosis   . Blood dyscrasia     HSV  . Cancer     vaginal  malignant carcinoma  . PCOS (polycystic ovarian syndrome)     reports that she has been smoking Cigarettes.  She has a 10 pack-year smoking history. She has never used smokeless tobacco. She reports that she drinks alcohol. She reports that she does not use illicit drugs. Family History  Problem Relation Age of Onset  . Arthritis Mother   . Clotting disorder Mother   . Hyperlipidemia Father   . Leukemia Maternal Grandmother   . Diabetes Maternal Grandfather   . Stroke Paternal Grandmother   . Heart disease Father   . Diabetes Mother   . Hypertension Mother   . Prostate cancer Father   . Heart disease Paternal Grandmother            Allergies:   Allergies  Allergen Reactions  . Avelox [Moxifloxacin Hcl In Nacl] Hives  . Levofloxacin Hives  .  Penicillins Hives  . Quinolones Hives    ACT Assessment Complete:  Yes:    Educational Status    Risk to Self: Risk to self with the past 6 months Is patient at risk for suicide?: Yes Substance abuse history and/or treatment for substance abuse?: Yes  Risk to Others:    Abuse:    Prior Inpatient Therapy:    Prior Outpatient Therapy:    Additional Information:                    Objective: Blood pressure 131/75, pulse 108, temperature 98.3 F (36.8 C), temperature source Oral, resp. rate 22, last menstrual period 03/12/2014, SpO2 97.00%.There is no weight on file to calculate BMI. Results for orders placed during the hospital encounter of 04/02/14 (from the past 72 hour(s))  URINE RAPID DRUG SCREEN  (HOSP PERFORMED)     Status: None   Collection Time    04/02/14  1:31 PM      Result Value Ref Range   Opiates NONE DETECTED  NONE DETECTED   Cocaine NONE DETECTED  NONE DETECTED   Benzodiazepines NONE DETECTED  NONE DETECTED   Amphetamines NONE DETECTED  NONE DETECTED   Tetrahydrocannabinol NONE DETECTED  NONE DETECTED   Barbiturates NONE DETECTED  NONE DETECTED   Comment:            DRUG SCREEN FOR MEDICAL PURPOSES     ONLY.  IF CONFIRMATION IS NEEDED     FOR ANY PURPOSE, NOTIFY LAB     WITHIN 5 DAYS.                LOWEST DETECTABLE LIMITS     FOR URINE DRUG SCREEN     Drug Class       Cutoff (ng/mL)     Amphetamine      1000     Barbiturate      200     Benzodiazepine   081     Tricyclics       448     Opiates          300     Cocaine          300     THC              50  ACETAMINOPHEN LEVEL     Status: None   Collection Time    04/02/14  2:05 PM      Result Value Ref Range   Acetaminophen (Tylenol), Serum <15.0  10 - 30 ug/mL   Comment:            THERAPEUTIC CONCENTRATIONS VARY     SIGNIFICANTLY. A RANGE OF 10-30     ug/mL MAY BE AN EFFECTIVE     CONCENTRATION FOR MANY PATIENTS.     HOWEVER, SOME ARE BEST TREATED     AT CONCENTRATIONS OUTSIDE THIS     RANGE.     ACETAMINOPHEN CONCENTRATIONS     >150 ug/mL AT 4 HOURS AFTER     INGESTION AND >50 ug/mL AT 12     HOURS AFTER INGESTION ARE     OFTEN ASSOCIATED WITH TOXIC     REACTIONS.  CBC     Status: None   Collection Time    04/02/14  2:05 PM      Result Value Ref Range   WBC 10.4  4.0 - 10.5 K/uL   RBC 4.41  3.87 - 5.11 MIL/uL   Hemoglobin 14.1  12.0 - 15.0 g/dL   HCT 41.0  36.0 - 46.0 %   MCV 93.0  78.0 - 100.0 fL   MCH 32.0  26.0 - 34.0 pg   MCHC 34.4  30.0 - 36.0 g/dL   RDW 13.7  11.5 - 15.5 %   Platelets 257  150 - 400 K/uL  COMPREHENSIVE METABOLIC PANEL     Status: Abnormal   Collection Time    04/02/14  2:05 PM      Result Value Ref Range   Sodium 138  137 - 147 mEq/L   Potassium 3.7  3.7 -  5.3 mEq/L   Chloride 95 (*) 96 - 112 mEq/L   CO2 22  19 - 32 mEq/L   Glucose, Bld 102 (*) 70 - 99 mg/dL   BUN 10  6 - 23 mg/dL   Creatinine, Ser 0.94  0.50 - 1.10 mg/dL   Calcium 9.7  8.4 - 10.5 mg/dL   Total Protein 7.6  6.0 - 8.3 g/dL   Albumin 3.9  3.5 - 5.2 g/dL   AST 92 (*) 0 - 37 U/L   ALT 75 (*) 0 - 35 U/L   Alkaline Phosphatase 65  39 - 117 U/L   Total Bilirubin 0.6  0.3 - 1.2 mg/dL   GFR calc non Af Amer 75 (*) >90 mL/min   GFR calc Af Amer 87 (*) >90 mL/min   Comment: (NOTE)     The eGFR has been calculated using the CKD EPI equation.     This calculation has not been validated in all clinical situations.     eGFR's persistently <90 mL/min signify possible Chronic Kidney     Disease.   Anion gap 21 (*) 5 - 15  ETHANOL     Status: Abnormal   Collection Time    04/02/14  2:05 PM      Result Value Ref Range   Alcohol, Ethyl (B) 199 (*) 0 - 11 mg/dL   Comment:            LOWEST DETECTABLE LIMIT FOR     SERUM ALCOHOL IS 11 mg/dL     FOR MEDICAL PURPOSES ONLY  SALICYLATE LEVEL     Status: Abnormal   Collection Time    04/02/14  2:05 PM      Result Value Ref Range   Salicylate Lvl <2.4 (*) 2.8 - 20.0 mg/dL   Labs are reviewed and are pertinent for medical issues being addressed.  Current Facility-Administered Medications  Medication Dose Route Frequency Provider Last Rate Last Dose  . gabapentin (NEURONTIN) capsule 1,200 mg  1,200 mg Oral TID Virgel Manifold, MD   1,200 mg at 04/02/14 1607  . ibuprofen (ADVIL,MOTRIN) tablet 800 mg  800 mg Oral Q8H PRN Virgel Manifold, MD   800 mg at 04/02/14 1557  . [START ON 04/03/2014] influenza vac split quadrivalent PF (FLUARIX) injection 0.5 mL  0.5 mL Intramuscular Tomorrow-1000 Virgel Manifold, MD      . Derrill Memo ON 04/03/2014] levothyroxine (SYNTHROID, LEVOTHROID) tablet 50 mcg  50 mcg Oral QAC breakfast Virgel Manifold, MD      . nicotine (NICODERM CQ - dosed in mg/24 hours) patch 21 mg  21 mg Transdermal Once Virgel Manifold, MD   21 mg at  04/02/14 1454  . [START ON 04/03/2014] PARoxetine (PAXIL) tablet 40 mg  40 mg Oral q morning - 10a Virgel Manifold, MD       Current Outpatient Prescriptions  Medication Sig Dispense Refill  .  gabapentin (NEURONTIN) 600 MG tablet Take 1,200 mg by mouth 4 (four) times daily.      Marland Kitchen ibuprofen (ADVIL,MOTRIN) 800 MG tablet Take 1 tablet (800 mg total) by mouth every 8 (eight) hours as needed for moderate pain. Take with food.  30 tablet  0  . levothyroxine (SYNTHROID, LEVOTHROID) 50 MCG tablet Take 1 tablet (50 mcg total) by mouth daily before breakfast.  30 tablet  1  . PARoxetine (PAXIL) 40 MG tablet Take 1 tablet (40 mg total) by mouth every morning.  30 tablet  1    Psychiatric Specialty Exam:     Blood pressure 131/75, pulse 108, temperature 98.3 F (36.8 C), temperature source Oral, resp. rate 22, last menstrual period 03/12/2014, SpO2 97.00%.There is no weight on file to calculate BMI.  General Appearance: Disheveled  Eye Contact::  Good  Speech:  Normal Rate  Volume:  Normal  Mood:  Depressed  Affect:  Congruent  Thought Process:  Coherent  Orientation:  Full (Time, Place, and Person)  Thought Content:  WDL  Suicidal Thoughts:  No  Homicidal Thoughts:  No  Memory:  Immediate;   Fair Recent;   Fair Remote;   Fair  Judgement:  Poor  Insight:  Fair  Psychomotor Activity:  Decreased  Concentration:  Fair  Recall:  [employer] of Knowledge:Fair  Language: Fair  Akathisia:  No  Handed:  Right  AIMS (if indicated):     Assets:  Communication Skills Desire for Improvement Financial Resources/Insurance Housing Leisure Time Resilience Transportation  Sleep:      Musculoskeletal: Strength & Muscle Tone: within normal limits Gait & Station: normal Patient leans: N/A  Treatment Plan Summary: Daily contact with patient to assess and evaluate symptoms and progress in treatment Medication management; [facility] detox protocol started along with her home medications; inpatient alcohol  detox needed.  Waylan Boga, [place] 04/02/2014 4:21 PM  Patient seen, evaluated and I agree with notes by Nurse Practitioner. Corena Pilgrim, MD

## 2014-04-02 NOTE — ED Notes (Signed)
Patient approached nurses' station and asked if she was under surveillance because she felt that she couldn't move "like a seizure" and/or was "having a nightmare". Patient is clammy. Asked about getting sleep aid.  Encouragement offered. Given Neurontin and Ativan.  Q 15 safety checks continue.

## 2014-04-02 NOTE — ED Notes (Signed)
Pt is here voluntarily and brought in from home. Pt placed in waiting room by EMS.

## 2014-04-03 ENCOUNTER — Encounter (HOSPITAL_COMMUNITY): Payer: Self-pay

## 2014-04-03 ENCOUNTER — Observation Stay (HOSPITAL_COMMUNITY)
Admission: AD | Admit: 2014-04-03 | Discharge: 2014-04-05 | Disposition: A | Discharge status: Home / Self Care | Payer: Medicare Other | Source: Intra-hospital | Attending: Psychiatry | Admitting: Psychiatry

## 2014-04-03 DIAGNOSIS — F1020 Alcohol dependence, uncomplicated: Secondary | ICD-10-CM

## 2014-04-03 DIAGNOSIS — E039 Hypothyroidism, unspecified: Secondary | ICD-10-CM

## 2014-04-03 DIAGNOSIS — F10230 Alcohol dependence with withdrawal, uncomplicated: Secondary | ICD-10-CM | POA: Diagnosis present

## 2014-04-03 MED ORDER — LORAZEPAM 1 MG PO TABS: 1.0000 mg | ORAL_TABLET | Freq: Every day | ORAL | Status: DC

## 2014-04-03 MED ORDER — IBUPROFEN 800 MG PO TABS
800.0000 mg | ORAL_TABLET | Freq: Three times a day (TID) | ORAL | Status: DC | PRN
  Administered 2014-04-03 – 2014-04-04 (×2): 800 mg via ORAL
  Filled 2014-04-03 (×2): qty 1

## 2014-04-03 MED ORDER — LORAZEPAM 1 MG PO TABS
1.0000 mg | ORAL_TABLET | Freq: Once | ORAL | Status: AC
  Administered 2014-04-04: 1 mg via ORAL
  Filled 2014-04-03: qty 1

## 2014-04-03 MED ORDER — LORAZEPAM 1 MG PO TABS: 0.0000 mg | ORAL_TABLET | Freq: Two times a day (BID) | ORAL | Status: DC

## 2014-04-03 MED ORDER — GABAPENTIN 400 MG PO CAPS
800.0000 mg | ORAL_CAPSULE | Freq: Four times a day (QID) | ORAL | Status: DC
  Administered 2014-04-03 – 2014-04-05 (×9): 800 mg via ORAL
  Filled 2014-04-03 (×18): qty 2
  Filled 2014-04-03: qty 8

## 2014-04-03 MED ORDER — THIAMINE HCL 100 MG/ML IJ SOLN: 100.0000 mg | Freq: Every day | INTRAMUSCULAR | Status: DC

## 2014-04-03 MED ORDER — ADULT MULTIVITAMIN W/MINERALS CH: 1.0000 | ORAL_TABLET | Freq: Every day | ORAL | Status: DC

## 2014-04-03 MED ORDER — VITAMIN B-1 100 MG PO TABS
100.0000 mg | ORAL_TABLET | Freq: Every day | ORAL | Status: DC
  Administered 2014-04-04 – 2014-04-05 (×2): 100 mg via ORAL
  Filled 2014-04-03 (×4): qty 1

## 2014-04-03 MED ORDER — ADULT MULTIVITAMIN W/MINERALS CH
1.0000 | ORAL_TABLET | Freq: Every day | ORAL | Status: DC
  Administered 2014-04-03 – 2014-04-05 (×3): 1 via ORAL
  Filled 2014-04-03 (×6): qty 1

## 2014-04-03 MED ORDER — VITAMIN B-1 100 MG PO TABS: 100.0000 mg | ORAL_TABLET | Freq: Every day | ORAL | Status: DC

## 2014-04-03 MED ORDER — LORAZEPAM 1 MG PO TABS
1.0000 mg | ORAL_TABLET | Freq: Four times a day (QID) | ORAL | Status: AC
  Administered 2014-04-03 (×3): 1 mg via ORAL
  Filled 2014-04-03 (×3): qty 1

## 2014-04-03 MED ORDER — NICOTINE 21 MG/24HR TD PT24
21.0000 mg | MEDICATED_PATCH | Freq: Every day | TRANSDERMAL | Status: DC
  Administered 2014-04-03 – 2014-04-05 (×3): 21 mg via TRANSDERMAL
  Filled 2014-04-03 (×7): qty 1

## 2014-04-03 MED ORDER — PAROXETINE HCL 20 MG PO TABS
40.0000 mg | ORAL_TABLET | Freq: Every day | ORAL | Status: DC
  Filled 2014-04-03 (×3): qty 2

## 2014-04-03 MED ORDER — METHOCARBAMOL 500 MG PO TABS
1000.0000 mg | ORAL_TABLET | Freq: Once | ORAL | Status: AC
  Administered 2014-04-03: 1000 mg via ORAL
  Filled 2014-04-03: qty 2

## 2014-04-03 MED ORDER — LORAZEPAM 1 MG PO TABS
1.0000 mg | ORAL_TABLET | Freq: Once | ORAL | Status: AC
  Administered 2014-04-03: 1 mg via ORAL
  Filled 2014-04-03: qty 1

## 2014-04-03 MED ORDER — ONDANSETRON 4 MG PO TBDP
4.0000 mg | ORAL_TABLET | Freq: Four times a day (QID) | ORAL | Status: DC | PRN
  Administered 2014-04-03 – 2014-04-05 (×7): 4 mg via ORAL
  Filled 2014-04-03 (×7): qty 1

## 2014-04-03 MED ORDER — LORAZEPAM 1 MG PO TABS: 0.0000 mg | ORAL_TABLET | Freq: Four times a day (QID) | ORAL | Status: DC

## 2014-04-03 MED ORDER — ALUM & MAG HYDROXIDE-SIMETH 200-200-20 MG/5ML PO SUSP: 30.0000 mL | ORAL | Status: DC | PRN

## 2014-04-03 MED ORDER — ZOLPIDEM TARTRATE 5 MG PO TABS
5.0000 mg | ORAL_TABLET | Freq: Every evening | ORAL | Status: DC | PRN
  Administered 2014-04-03: 5 mg via ORAL
  Filled 2014-04-03 (×2): qty 1

## 2014-04-03 MED ORDER — GABAPENTIN 400 MG PO CAPS: 800.0000 mg | ORAL_CAPSULE | Freq: Four times a day (QID) | ORAL | Status: DC

## 2014-04-03 MED ORDER — LEVOTHYROXINE SODIUM 50 MCG PO TABS
50.0000 ug | ORAL_TABLET | Freq: Every day | ORAL | Status: DC
  Administered 2014-04-04 – 2014-04-05 (×2): 50 ug via ORAL
  Filled 2014-04-03: qty 2
  Filled 2014-04-03: qty 1
  Filled 2014-04-03: qty 2
  Filled 2014-04-03: qty 1

## 2014-04-03 MED ORDER — MAGNESIUM HYDROXIDE 400 MG/5ML PO SUSP: 30.0000 mL | Freq: Every day | ORAL | Status: DC | PRN

## 2014-04-03 MED ORDER — LOPERAMIDE HCL 2 MG PO CAPS
4.0000 mg | ORAL_CAPSULE | Freq: Three times a day (TID) | ORAL | Status: DC | PRN
  Administered 2014-04-03 – 2014-04-04 (×3): 4 mg via ORAL
  Filled 2014-04-03 (×4): qty 2

## 2014-04-03 MED ORDER — HYDROXYZINE HCL 25 MG PO TABS
25.0000 mg | ORAL_TABLET | Freq: Four times a day (QID) | ORAL | Status: DC | PRN
  Administered 2014-04-03 – 2014-04-05 (×6): 25 mg via ORAL
  Filled 2014-04-03 (×7): qty 1

## 2014-04-03 MED ORDER — LORAZEPAM 1 MG PO TABS
1.0000 mg | ORAL_TABLET | Freq: Two times a day (BID) | ORAL | Status: DC
  Administered 2014-04-05: 1 mg via ORAL
  Filled 2014-04-03: qty 1

## 2014-04-03 MED ORDER — FOLIC ACID 1 MG PO TABS
1.0000 mg | ORAL_TABLET | Freq: Every day | ORAL | Status: DC
  Administered 2014-04-04 – 2014-04-05 (×2): 1 mg via ORAL
  Filled 2014-04-03 (×4): qty 1

## 2014-04-03 MED ORDER — ACETAMINOPHEN 325 MG PO TABS: 650.0000 mg | ORAL_TABLET | Freq: Four times a day (QID) | ORAL | Status: DC | PRN

## 2014-04-03 MED ORDER — LORAZEPAM 1 MG PO TABS
1.0000 mg | ORAL_TABLET | Freq: Three times a day (TID) | ORAL | Status: AC
  Administered 2014-04-04 (×3): 1 mg via ORAL
  Filled 2014-04-03: qty 1
  Filled 2014-04-03: qty 2
  Filled 2014-04-03: qty 1

## 2014-04-03 NOTE — ED Notes (Addendum)
Patient alert. Requesting meal. Reports feeling better. Sandwich and cheese given. Q 15 safety checks continue.

## 2014-04-03 NOTE — ED Notes (Signed)
Pt sleeping at present, will monitor for safety.

## 2014-04-03 NOTE — Progress Notes (Signed)
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 Her last rehab was a year ago at the [facility]. Last night, she had her first withdrawal seizure prior to coming here. She takes Paxil 40 mg daily for depression from her PCP. [name] shares custody of her 40 year old daughter with her ex-husband. Patient's current concern is that she gets back home after detox ing to clean her house. Patient states that her house is "trashed,food thrown everywhere dished broken." Patient offered meal.

## 2014-04-03 NOTE — Progress Notes (Signed)
Pt c/o increased anxiety and requesting additional ativan. Cori NP at bedside with pt. Orders given. Will continue to monitor closely and evaluate for stabilization.

## 2014-04-03 NOTE — Progress Notes (Signed)
[place] INPATIENT:  Family/Significant Other Suicide Prevention Education  Suicide Prevention Education:  Patient Refusal for Family/Significant Other Suicide Prevention Education: The patient [name] has refused to provide written consent for family/significant other to be provided Family/Significant Other Suicide Prevention Education during admission and/or prior to discharge.  Physician notified.  [place], [place] Liz 04/03/2014, 7:02 PM

## 2014-04-03 NOTE — ED Notes (Signed)
[facility] transport requested. 

## 2014-04-03 NOTE — BHH Counselor (Signed)
Per Letitia Libra [facility], pt has been accepted by Dr Laurena Spies NP to OBS bed #5. Pt signed OBS consent form and Probation officer faxed form to [facility].  Arnold Long, [place] Assessment Counselor

## 2014-04-03 NOTE — Progress Notes (Signed)
Patient ID: [name], female   DOB: 1976/05/13, 38 y.o.   MRN: [id number] Pt alert and oriented. -SI/HI, -A/vhall. Verbally contracts for safety. Pt c/o anxiety, tremors, headache and nausea. Pt needy and intrusive with staff. Medication given as ordered and emotional support provided. Will continue to monitor closely and evaluate for stabilization.

## 2014-04-03 NOTE — Consult Note (Signed)
[facility] Face-to-Face Psychiatry Consult   Reason for Consult:  Alcohol dependence/detox and anxiety Referring Physician:  EDP  [name] is an 38 y.o. female. Total Time spent with patient: 20 minutes  Assessment: AXIS I:  Alcohol dependece and Substance Induced Mood Disorder AXIS II:  Deferred AXIS III:   Past Medical History  Diagnosis Date  . Narcotic abuse in remission   . Thyroid disease   . Chronic back pain   . Hypothyroidism   . Anxiety   . GERD (gastroesophageal reflux disease)   . Sleep apnea     cpap  . Headache(784.0)   . Neuromuscular disorder     neuropathy  . Arthritis     knees bilateral   DJD  stenosis   . Blood dyscrasia     HSV  . Cancer     vaginal  malignant carcinoma  . PCOS (polycystic ovarian syndrome)    AXIS IV:  other psychosocial or environmental problems, problems related to social environment and problems with primary support group AXIS V:  21-30 behavior considerably influenced by delusions or hallucinations OR serious impairment in judgment, communication OR inability to function in almost all areas  Plan:  Recommend psychiatric Inpatient admission when medically cleared.  Subjective:   [name] is a 38 y.o. female patient admitted with alcohol dependence/withdrawal, seeking for Alcohol detoxification.  HPI:  Patient states that she is a recovery alcoholic with 1 year of sobriety but relapsed about 2 months ago. Patient is endorsing worsening anxiety, muscle spasm, night sweats, shakiness, clamminess, headache, and back ache. She denies SI, HI, AVH but rates her anxiety 10/10 and depression 8/10. Patient appears clammy and restless.  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  Her last rehab was a year ago at the [facility].  Last night, she had her first withdrawal seizure prior to coming here.  She takes Paxil 40 mg daily for depression from her PCP.  [name] Glassman shares custody  of her 37 year old daughter with her ex-husband. HPI Elements:   Location:  Generalized anxiey, Alcohol dependence Quality:  acute. Severity:  severe. Timing:  constant. Duration:  2 months. Context:  stressors.  Past Psychiatric History: Past Medical History  Diagnosis Date  . Narcotic abuse in remission   . Thyroid disease   . Chronic back pain   . Hypothyroidism   . Anxiety   . GERD (gastroesophageal reflux disease)   . Sleep apnea     cpap  . Headache(784.0)   . Neuromuscular disorder     neuropathy  . Arthritis     knees bilateral   DJD  stenosis   . Blood dyscrasia     HSV  . Cancer     vaginal  malignant carcinoma  . PCOS (polycystic ovarian syndrome)     reports that she has been smoking Cigarettes.  She has a 10 pack-year smoking history. She has never used smokeless tobacco. She reports that she drinks alcohol. She reports that she does not use illicit drugs. Family History  Problem Relation Age of Onset  . Arthritis Mother   . Clotting disorder Mother   . Hyperlipidemia Father   . Leukemia Maternal Grandmother   . Diabetes Maternal Grandfather   . Stroke Paternal Grandmother   . Heart disease Father   . Diabetes Mother   . Hypertension Mother   . Prostate cancer Father   . Heart disease Paternal Grandmother  Allergies:   Allergies  Allergen Reactions  . Avelox [Moxifloxacin Hcl In Nacl] Hives  . Levofloxacin Hives  . Penicillins Hives  . Quinolones Hives    ACT Assessment Complete:  Yes:    Educational Status    Risk to Self: Risk to self with the past 6 months Is patient at risk for suicide?: Yes Substance abuse history and/or treatment for substance abuse?: Yes  Risk to Others:    Abuse:    Prior Inpatient Therapy:    Prior Outpatient Therapy:    Additional Information:              Objective: Blood pressure 137/80, pulse 97, temperature 98.1 F (36.7 C), temperature source Oral, resp. rate 20, last menstrual  period 03/12/2014, SpO2 97.00%.There is no weight on file to calculate BMI. Results for orders placed during the hospital encounter of 04/02/14 (from the past 72 hour(s))  URINE RAPID DRUG SCREEN (HOSP PERFORMED)     Status: None   Collection Time    04/02/14  1:31 PM      Result Value Ref Range   Opiates NONE DETECTED  NONE DETECTED   Cocaine NONE DETECTED  NONE DETECTED   Benzodiazepines NONE DETECTED  NONE DETECTED   Amphetamines NONE DETECTED  NONE DETECTED   Tetrahydrocannabinol NONE DETECTED  NONE DETECTED   Barbiturates NONE DETECTED  NONE DETECTED   Comment:            DRUG SCREEN FOR MEDICAL PURPOSES     ONLY.  IF CONFIRMATION IS NEEDED     FOR ANY PURPOSE, NOTIFY LAB     WITHIN 5 DAYS.                LOWEST DETECTABLE LIMITS     FOR URINE DRUG SCREEN     Drug Class       Cutoff (ng/mL)     Amphetamine      1000     Barbiturate      200     Benzodiazepine   496     Tricyclics       759     Opiates          300     Cocaine          300     THC              50  ACETAMINOPHEN LEVEL     Status: None   Collection Time    04/02/14  2:05 PM      Result Value Ref Range   Acetaminophen (Tylenol), Serum <15.0  10 - 30 ug/mL   Comment:            THERAPEUTIC CONCENTRATIONS VARY     SIGNIFICANTLY. A RANGE OF 10-30     ug/mL MAY BE AN EFFECTIVE     CONCENTRATION FOR MANY PATIENTS.     HOWEVER, SOME ARE BEST TREATED     AT CONCENTRATIONS OUTSIDE THIS     RANGE.     ACETAMINOPHEN CONCENTRATIONS     >150 ug/mL AT 4 HOURS AFTER     INGESTION AND >50 ug/mL AT 12     HOURS AFTER INGESTION ARE     OFTEN ASSOCIATED WITH TOXIC     REACTIONS.  CBC     Status: None   Collection Time    04/02/14  2:05 PM      Result Value Ref Range   WBC 10.4  4.0 - 10.5  K/uL   RBC 4.41  3.87 - 5.11 MIL/uL   Hemoglobin 14.1  12.0 - 15.0 g/dL   HCT 41.0  36.0 - 46.0 %   MCV 93.0  78.0 - 100.0 fL   MCH 32.0  26.0 - 34.0 pg   MCHC 34.4  30.0 - 36.0 g/dL   RDW 13.7  11.5 - 15.5 %   Platelets  257  150 - 400 K/uL  COMPREHENSIVE METABOLIC PANEL     Status: Abnormal   Collection Time    04/02/14  2:05 PM      Result Value Ref Range   Sodium 138  137 - 147 mEq/L   Potassium 3.7  3.7 - 5.3 mEq/L   Chloride 95 (*) 96 - 112 mEq/L   CO2 22  19 - 32 mEq/L   Glucose, Bld 102 (*) 70 - 99 mg/dL   BUN 10  6 - 23 mg/dL   Creatinine, Ser 0.94  0.50 - 1.10 mg/dL   Calcium 9.7  8.4 - 10.5 mg/dL   Total Protein 7.6  6.0 - 8.3 g/dL   Albumin 3.9  3.5 - 5.2 g/dL   AST 92 (*) 0 - 37 U/L   ALT 75 (*) 0 - 35 U/L   Alkaline Phosphatase 65  39 - 117 U/L   Total Bilirubin 0.6  0.3 - 1.2 mg/dL   GFR calc non Af Amer 75 (*) >90 mL/min   GFR calc Af Amer 87 (*) >90 mL/min   Comment: (NOTE)     The eGFR has been calculated using the CKD EPI equation.     This calculation has not been validated in all clinical situations.     eGFR's persistently <90 mL/min signify possible Chronic Kidney     Disease.   Anion gap 21 (*) 5 - 15  ETHANOL     Status: Abnormal   Collection Time    04/02/14  2:05 PM      Result Value Ref Range   Alcohol, Ethyl (B) 199 (*) 0 - 11 mg/dL   Comment:            LOWEST DETECTABLE LIMIT FOR     SERUM ALCOHOL IS 11 mg/dL     FOR MEDICAL PURPOSES ONLY  SALICYLATE LEVEL     Status: Abnormal   Collection Time    04/02/14  2:05 PM      Result Value Ref Range   Salicylate Lvl <3.3 (*) 2.8 - 20.0 mg/dL   Labs are reviewed and are pertinent for medical issues being addressed.  Current Facility-Administered Medications  Medication Dose Route Frequency Provider Last Rate Last Dose  . folic acid (FOLVITE) tablet 1 mg  1 mg Oral Daily Waylan Boga, NP   1 mg at 04/03/14 0935  . gabapentin (NEURONTIN) capsule 800 mg  800 mg Oral QID        . ibuprofen (ADVIL,MOTRIN) tablet 800 mg  800 mg Oral Q8H PRN Virgel Manifold, MD   800 mg at 04/02/14 1557  . levothyroxine (SYNTHROID, LEVOTHROID) tablet 50 mcg  50 mcg Oral QAC breakfast Virgel Manifold, MD   50 mcg at 04/03/14  0817  . loperamide (IMODIUM) capsule 4 mg  4 mg Oral TID PRN [name]. Pickering, MD   4 mg at 04/03/14 0935  . LORazepam (ATIVAN) tablet 0-4 mg  0-4 mg Oral Q6H Waylan Boga, NP   2 mg at 04/03/14 0640   Followed by  . [START ON 04/04/2014] LORazepam (ATIVAN)  tablet 0-4 mg  0-4 mg Oral Q12H Waylan Boga, NP      . multivitamin with minerals tablet 1 tablet  1 tablet Oral Daily Waylan Boga, NP   1 tablet at 04/03/14 0935  . nicotine (NICODERM CQ - dosed in mg/24 hours) patch 21 mg  21 mg Transdermal Once Virgel Manifold, MD   21 mg at 04/02/14 1454  . PARoxetine (PAXIL) tablet 40 mg  40 mg Oral q morning - 10a Virgel Manifold, MD   40 mg at 04/03/14 0935  . thiamine (VITAMIN B-1) tablet 100 mg  100 mg Oral Daily Waylan Boga, NP   100 mg at 04/03/14 0935   Or  . thiamine (B-1) injection 100 mg  100 mg Intravenous Daily Waylan Boga, NP      . zolpidem (AMBIEN) tablet 5 mg  5 mg Oral QHS PRN [name]. Pickering, MD   5 mg at 04/02/14 2235   Current Outpatient Prescriptions  Medication Sig Dispense Refill  . gabapentin (NEURONTIN) 600 MG tablet Take 1,200 mg by mouth 4 (four) times daily.      Marland Kitchen ibuprofen (ADVIL,MOTRIN) 800 MG tablet Take 1 tablet (800 mg total) by mouth every 8 (eight) hours as needed for moderate pain. Take with food.  30 tablet  0  . levothyroxine (SYNTHROID, LEVOTHROID) 50 MCG tablet Take 1 tablet (50 mcg total) by mouth daily before breakfast.  30 tablet  1  . PARoxetine (PAXIL) 40 MG tablet Take 1 tablet (40 mg total) by mouth every morning.  30 tablet  1    Psychiatric Specialty Exam:     Blood pressure 137/80, pulse 97, temperature 98.1 F (36.7 C), temperature source Oral, resp. rate 20, last menstrual period 03/12/2014, SpO2 97.00%.There is no weight on file to calculate BMI.  General Appearance: Disheveled  Eye Contact::  Good  Speech:  Normal Rate  Volume:  Normal  Mood:  Depressed  Affect:  Congruent  Thought Process:  Coherent  Orientation:  Full (Time, Place,  and Person)  Thought Content:  WDL  Suicidal Thoughts:  No  Homicidal Thoughts:  No  Memory:  Immediate;   Fair Recent;   Fair Remote;   Fair  Judgement:  Poor  Insight:  Fair  Psychomotor Activity:  Decreased  Concentration:  Fair  Recall:  [employer] of Knowledge:Fair  Language: Fair  Akathisia:  No  Handed:  Right  AIMS (if indicated):     Assets:  Communication Skills Desire for Improvement Financial Resources/Insurance Housing Leisure Time Resilience Transportation  Sleep:      Musculoskeletal: Strength & Muscle Tone: within normal limits Gait & Station: normal Patient leans: N/A  Treatment Plan Summary: Daily contact with patient to assess and evaluate symptoms and progress in treatment Medication management; CIWA detox protocol started along with her home medications; inpatient alcohol detox needed. Patient will be [facility] OBS unit bed 5  Corena Pilgrim, MD 04/03/2014 10:09 AM

## 2014-04-03 NOTE — ED Notes (Signed)
Report called to RN Dawn, Observation Unit. [place].  Pt states she does not want to be admitted to Observation Unit.

## 2014-04-03 NOTE — ED Notes (Signed)
Patient reports awakening with muscle spasms in her sides. States muscles are cramping and requested that doctor be called for a muscle relaxer. Offered pain reliever.  Doctor called. Given Robaxin and Tylenol.  Q 15 safety checks continue.

## 2014-04-04 DIAGNOSIS — F1910 Other psychoactive substance abuse, uncomplicated: Secondary | ICD-10-CM

## 2014-04-04 DIAGNOSIS — F1994 Other psychoactive substance use, unspecified with psychoactive substance-induced mood disorder: Secondary | ICD-10-CM

## 2014-04-04 MED ORDER — PAROXETINE HCL 20 MG PO TABS
40.0000 mg | ORAL_TABLET | Freq: Every day | ORAL | Status: DC
  Administered 2014-04-04 – 2014-04-05 (×2): 40 mg via ORAL
  Filled 2014-04-04: qty 2
  Filled 2014-04-04: qty 4
  Filled 2014-04-04: qty 2

## 2014-04-04 MED ORDER — ZOLPIDEM TARTRATE 5 MG PO TABS
5.0000 mg | ORAL_TABLET | Freq: Once | ORAL | Status: AC
  Administered 2014-04-04: 5 mg via ORAL

## 2014-04-04 MED ORDER — ZOLPIDEM TARTRATE 5 MG PO TABS
10.0000 mg | ORAL_TABLET | Freq: Every evening | ORAL | Status: DC | PRN
  Administered 2014-04-04: 10 mg via ORAL
  Filled 2014-04-04: qty 2

## 2014-04-04 NOTE — Progress Notes (Signed)
Pt woke up around 1140 pm complaining of sweating and requested medication specipically ativan    She was told it was too early and was given Zofran for nausea which she said she also was having  Pt was also given 2 ice packs to help cool her off   About 20 min later pt was sound asleep

## 2014-04-04 NOTE — Progress Notes (Signed)
Pt up again at 2 am requesting ativan she said it helps her and she isnt drug seeking just trying to get better  Called np Cori and she ordered [place] for pt   Pt received ambien and vistaril and said she dropped the vistaril but it could not be found   Pt then asked does this mean I cant have another one   She has been argumentative and oppositional one minute then trying to hug staff the next   Pt remains med seeking and perhaps dishonest about dropping the pill since it couldn't be found

## 2014-04-04 NOTE — Progress Notes (Signed)
D) Pt awake in stretcher bed, calm at this time, watching tv and verbally interactive with peers. A & O X 4 this shift.  CIWA done and documented for 1200 and 1800. Pt. Contracts for safety while hospitalized. Pt has been demanding for medications for the entire shift especially Ativan. Pt's assigned psychiatrist did speak with her about her demands for Ativan 2 mg PO at bedtime, which was not granted as she is already getting scheduled Ativan 1 mg PO TID and she also had an extra 1 mg PO at 0800 (Ativan 2mg  PO at 0800). Pt remains very intrusive towards peers and staff; got verbally involved in peer's placement related conversation. Spent majority of this shift sleeping after each med request. A) Multiple PRN meds (Immodium, Zofran & Vistaril) given  As ordered per pt's request for nausea, diarrhea (loose stools X2 at 0800 & 1840) see MAR. Support and availability offered. Q 15 mins. checks maintained for safety. R) Pt. Receptive and cooperative with care. Safety maintained while in obs. Unit.

## 2014-04-04 NOTE — Progress Notes (Deleted)
[facility] MD Progress Note  04/04/2014 10:41 AM [name]  MRN:  [id number] Subjective:  38 Y/o female who states that after one year sober she relapsed on alcohol. She states she was drinking half a gallon daily. She states trying to come off she experienced a seizure. She endorses sweats, "shakes" nausea, diarrhea. She was started on Ativan detox. She had been prescribed Paxil that she wants to pursue. She also endorses back pain for what she takes Neurontin Diagnosis:   DSM5: Substance/Addictive Disorders:  Alcohol Related Disorder - Severe (303.90) Depressive Disorders:  Major Depressive Disorder - Moderate (296.22) Total Time spent with patient: 30 minutes  Axis I: Substance Induced Mood Disorder  ADL's:  Intact  Sleep: Poor  Psychiatric Specialty Exam: Physical Exam  Review of Systems  Constitutional: Positive for malaise/fatigue and diaphoresis.  HENT: Negative.   Eyes: Negative.   Respiratory: Negative.   Cardiovascular: Negative.   Gastrointestinal: Positive for nausea and diarrhea.  Genitourinary: Negative.   Musculoskeletal: Positive for back pain.  Skin: Negative.   Neurological: Positive for tremors and weakness.  Endo/Heme/Allergies: Negative.   Psychiatric/Behavioral: Positive for substance abuse. The patient is nervous/anxious and has insomnia.     Blood pressure 123/70, pulse 98, temperature 98.8 F (37.1 C), temperature source Oral, resp. rate 20, height $RemoveBe'5\' 7"'iAdJwyRwT$  (1.702 m), weight 145.151 kg (320 lb), last menstrual period 03/12/2014, SpO2 100.00%.Body mass index is 50.11 kg/(m^2).  General Appearance: Disheveled  Eye Sport and exercise psychologist::  Fair  Speech:  Clear and Coherent  Volume:  fluctuates  Mood:  Anxious and worried  Affect:  anxious, worried  Thought Process:  Coherent and Goal Directed  Orientation:  Full (Time, Place, and Person)  Thought Content:  events, symtpoms worries concerns  Suicidal Thoughts:  No  Homicidal Thoughts:  No  Memory:  Immediate;    Fair Recent;   Fair Remote;   Fair  Judgement:  Fair  Insight:  Present and Shallow  Psychomotor Activity:  Restlessness  Concentration:  Fair  Recall:  [employer] of Knowledge:NA  Language: Fair  Akathisia:  No  Handed:    AIMS (if indicated):     Assets:  Desire for Improvement Housing  Sleep:      Musculoskeletal: Strength & Muscle Tone: within normal limits Gait & Station: normal Patient leans: N/A  Current Medications: Current Facility-Administered Medications  Medication Dose Route Frequency Provider Last Rate Last Dose  . acetaminophen (TYLENOL) tablet 650 mg  650 mg Oral Q6H PRN Mojeed Akintayo      . alum & mag hydroxide-simeth (MAALOX/MYLANTA) 200-200-20 MG/5ML suspension 30 mL  30 mL Oral Q4H PRN Mojeed Akintayo      . folic acid (FOLVITE) tablet 1 mg  1 mg Oral Daily Mojeed Akintayo   1 mg at 04/04/14 0757  . gabapentin (NEURONTIN) capsule 800 mg  800 mg Oral QID Mojeed Akintayo   800 mg at 04/04/14 0756  . hydrOXYzine (ATARAX/VISTARIL) tablet 25 mg  25 mg Oral Q6H PRN Hampton Abbot, MD   25 mg at 04/03/14 1955  . ibuprofen (ADVIL,MOTRIN) tablet 800 mg  800 mg Oral Q8H PRN Mojeed Akintayo   800 mg at 04/03/14 1955  . levothyroxine (SYNTHROID, LEVOTHROID) tablet 50 mcg  50 mcg Oral QAC breakfast Mojeed Akintayo   50 mcg at 04/04/14 0614  . loperamide (IMODIUM) capsule 4 mg  4 mg Oral TID PRN Mojeed Akintayo   4 mg at 04/04/14 0801  . LORazepam (ATIVAN) tablet 1 mg  1 mg Oral  TID Hampton Abbot, MD   1 mg at 04/04/14 0800   Followed by  . [START ON 04/05/2014] LORazepam (ATIVAN) tablet 1 mg  1 mg Oral BID Hampton Abbot, MD       Followed by  . [START ON 04/06/2014] LORazepam (ATIVAN) tablet 1 mg  1 mg Oral Daily Hampton Abbot, MD      . magnesium hydroxide (MILK OF MAGNESIA) suspension 30 mL  30 mL Oral Daily PRN Mojeed Akintayo      . multivitamin with minerals tablet 1 tablet  1 tablet Oral Daily Hampton Abbot, MD   1 tablet at 04/04/14 0757  . nicotine (NICODERM CQ -  dosed in mg/24 hours) patch 21 mg  21 mg Transdermal Daily Nicholaus Bloom, MD   21 mg at 04/04/14 0800  . ondansetron (ZOFRAN-ODT) disintegrating tablet 4 mg  4 mg Oral Q6H PRN Hampton Abbot, MD   4 mg at 04/03/14 2347  . PARoxetine (PAXIL) tablet 40 mg  40 mg Oral Daily Nicholaus Bloom, MD      . thiamine (VITAMIN B-1) tablet 100 mg  100 mg Oral Daily Hampton Abbot, MD   100 mg at 04/04/14 0756  . zolpidem (AMBIEN) tablet 5 mg  5 mg Oral QHS PRN Mojeed Akintayo   5 mg at 04/03/14 2115    Lab Results:  Results for orders placed during the hospital encounter of 04/02/14 (from the past 48 hour(s))  URINE RAPID DRUG SCREEN (HOSP PERFORMED)     Status: None   Collection Time    04/02/14  1:31 PM      Result Value Ref Range   Opiates NONE DETECTED  NONE DETECTED   Cocaine NONE DETECTED  NONE DETECTED   Benzodiazepines NONE DETECTED  NONE DETECTED   Amphetamines NONE DETECTED  NONE DETECTED   Tetrahydrocannabinol NONE DETECTED  NONE DETECTED   Barbiturates NONE DETECTED  NONE DETECTED   Comment:            DRUG SCREEN FOR MEDICAL PURPOSES     ONLY.  IF CONFIRMATION IS NEEDED     FOR ANY PURPOSE, NOTIFY LAB     WITHIN 5 DAYS.                LOWEST DETECTABLE LIMITS     FOR URINE DRUG SCREEN     Drug Class       Cutoff (ng/mL)     Amphetamine      1000     Barbiturate      200     Benzodiazepine   299     Tricyclics       371     Opiates          300     Cocaine          300     THC              50  ACETAMINOPHEN LEVEL     Status: None   Collection Time    04/02/14  2:05 PM      Result Value Ref Range   Acetaminophen (Tylenol), Serum <15.0  10 - 30 ug/mL   Comment:            THERAPEUTIC CONCENTRATIONS VARY     SIGNIFICANTLY. A RANGE OF 10-30     ug/mL MAY BE AN EFFECTIVE     CONCENTRATION FOR MANY PATIENTS.     HOWEVER, SOME ARE BEST TREATED  AT CONCENTRATIONS OUTSIDE THIS     RANGE.     ACETAMINOPHEN CONCENTRATIONS     >150 ug/mL AT 4 HOURS AFTER     INGESTION AND >50  ug/mL AT 12     HOURS AFTER INGESTION ARE     OFTEN ASSOCIATED WITH TOXIC     REACTIONS.  CBC     Status: None   Collection Time    04/02/14  2:05 PM      Result Value Ref Range   WBC 10.4  4.0 - 10.5 K/uL   RBC 4.41  3.87 - 5.11 MIL/uL   Hemoglobin 14.1  12.0 - 15.0 g/dL   HCT 41.0  36.0 - 46.0 %   MCV 93.0  78.0 - 100.0 fL   MCH 32.0  26.0 - 34.0 pg   MCHC 34.4  30.0 - 36.0 g/dL   RDW 13.7  11.5 - 15.5 %   Platelets 257  150 - 400 K/uL  COMPREHENSIVE METABOLIC PANEL     Status: Abnormal   Collection Time    04/02/14  2:05 PM      Result Value Ref Range   Sodium 138  137 - 147 mEq/L   Potassium 3.7  3.7 - 5.3 mEq/L   Chloride 95 (*) 96 - 112 mEq/L   CO2 22  19 - 32 mEq/L   Glucose, Bld 102 (*) 70 - 99 mg/dL   BUN 10  6 - 23 mg/dL   Creatinine, Ser 0.94  0.50 - 1.10 mg/dL   Calcium 9.7  8.4 - 10.5 mg/dL   Total Protein 7.6  6.0 - 8.3 g/dL   Albumin 3.9  3.5 - 5.2 g/dL   AST 92 (*) 0 - 37 U/L   ALT 75 (*) 0 - 35 U/L   Alkaline Phosphatase 65  39 - 117 U/L   Total Bilirubin 0.6  0.3 - 1.2 mg/dL   GFR calc non Af Amer 75 (*) >90 mL/min   GFR calc Af Amer 87 (*) >90 mL/min   Comment: (NOTE)     The eGFR has been calculated using the CKD EPI equation.     This calculation has not been validated in all clinical situations.     eGFR's persistently <90 mL/min signify possible Chronic Kidney     Disease.   Anion gap 21 (*) 5 - 15  ETHANOL     Status: Abnormal   Collection Time    04/02/14  2:05 PM      Result Value Ref Range   Alcohol, Ethyl (B) 199 (*) 0 - 11 mg/dL   Comment:            LOWEST DETECTABLE LIMIT FOR     SERUM ALCOHOL IS 11 mg/dL     FOR MEDICAL PURPOSES ONLY  SALICYLATE LEVEL     Status: Abnormal   Collection Time    04/02/14  2:05 PM      Result Value Ref Range   Salicylate Lvl <8.6 (*) 2.8 - 20.0 mg/dL    Physical Findings: AIMS: Facial and Oral Movements Muscles of Facial Expression: None, normal Lips and Perioral Area: None, normal Jaw: None,  normal Tongue: None, normal,Extremity Movements Upper (arms, wrists, hands, fingers): None, normal Lower (legs, knees, ankles, toes): None, normal, Trunk Movements Neck, shoulders, hips: None, normal, Overall Severity Severity of abnormal movements (highest score from questions above): None, normal Incapacitation due to abnormal movements: None, normal Patient's awareness of abnormal movements (rate only patient's report): No  Awareness, Dental Status Current problems with teeth and/or dentures?: No Does patient usually wear dentures?: No  CIWA:  CIWA-Ar Total: 9 COWS:  COWS Total Score: 8  Treatment Plan Summary: Daily contact with patient to assess and evaluate symptoms and progress in treatment Medication management  Plan: Supportive approach/coping skills/relapse prevention           Pursue Ativan Detox           Resume Paxil 40 mg daily  Medical Decision Making Problem Points:  Established problem, worsening (2) and Review of psycho-social stressors (1) Data Points:  Review of medication regiment & side effects (2)  I certify that inpatient services furnished can reasonably be expected to improve the patient's condition.   Clemens Lachman A 04/04/2014, 10:41 AM

## 2014-04-04 NOTE — Progress Notes (Signed)
Pt alert and oriented.  Denies SI/HI, verbally contracts for safety. -A/V hall. C/o anxiety and restlessness. "I'm much better today my friends are here." pt reports being friends with other pts that arrived today.  Pt frequently requesting medication. Medication given as ordered and emotional support provided. Will continue to monitor closely and evaluate for stabilization.

## 2014-04-05 MED ORDER — PAROXETINE HCL 40 MG PO TABS: 40.0000 mg | ORAL_TABLET | Freq: Every day | ORAL | Status: DC

## 2014-04-05 MED ORDER — GABAPENTIN 400 MG PO CAPS: 800.0000 mg | ORAL_CAPSULE | Freq: Four times a day (QID) | ORAL | Status: DC

## 2014-04-05 MED ORDER — HYDROXYZINE HCL 25 MG PO TABS: 25.0000 mg | ORAL_TABLET | Freq: Four times a day (QID) | ORAL | Status: DC | PRN

## 2014-04-05 MED ORDER — TRAZODONE HCL 50 MG PO TABS: 50.0000 mg | ORAL_TABLET | Freq: Every evening | ORAL | Status: DC | PRN

## 2014-04-05 MED ORDER — LEVOTHYROXINE SODIUM 50 MCG PO TABS: 50.0000 ug | ORAL_TABLET | Freq: Every day | ORAL | Status: DC

## 2014-04-05 NOTE — Plan of Care (Signed)
[facility] Observation Crisis Plan  Reason for Crisis Plan:  Substance Abuse   Plan of Care:  Referral for Substance Abuse  Family Support:    Parents, 12-Step community  Current Living Environment:  Living Arrangements: Children; pt lives in her own residence.  Her 38 y/o daughter spends half her time with the pt and half her time with the child's father.  Pt can return to household.  Insurance:  [facility] Account   Name Acct ID Class Status Primary Coverage   [name], [name] [id number] [place] Discharged/Not [place] [facility] [place] MEDICARE - BLUE MEDICARE        Guarantor Account (for Hospital Account [id number])   Name Relation to Pt Service Area Active? Acct Type   [name] Yes [facility]   Address Phone       [address] [place], [place] [address] [phone])          Coverage Information (for Hospital Account [id number])   F/O Payor/Plan Precert #   BLUE CROSS BLUE SHIELD OF [place] #   [name] [id number]   Address Phone   [address], [place] [address] [phone]      Legal Guardian:   Self  Primary Care Provider:  Leandrew Koyanagi, MD  Current Outpatient Providers:  None  Psychiatrist:   None  Counselor/Therapist:   None  Compliant with Medications:  Yes  Additional Information: After consulting with [name] Pizza, NP it has been determined that pt does not present a life threatening danger to herself or others, and that psychiatric hospitalization is not indicated for her at this time.  She would benefit, however, from outpatient substance abuse treatment.  This was discussed with the pt.  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  She would prefer to follow up on her own.  She  is also well acquainted with the local 12-Step community and does not need information on meetings in this area.  She is advised to arrange for treatment at her earliest convenience.  [name], [place] Triage Specialist [name] 9/10/20152:20 PM

## 2014-04-05 NOTE — Progress Notes (Signed)
Patient woke up this morning requesting her 0800am dose of Ativan.  Writer instructed patient it is not due until 0800am. and she states she is having back pain.  Writer offered her Ibuprofen but patient refused stating she needs stronger medication.  Patient went back to bed.

## 2014-04-05 NOTE — Discharge Summary (Signed)
[facility] OBS UNIT DISCHARGE SUMMARY & SRA  04/05/2014 11:21 AM [name]  MRN:  [id number]  Subjective: Pt seen and chart reviewed. Pt denies SI, HI, and AVH, contracts for safety. Pt denies any current withdrawal symptoms. Pt is also in agreement with plan to followup with outpatient therapy and psychiatry. To be assisted by TTS Staff.  HPI:  38 Y/o female who states that after one year sober she relapsed on alcohol. She states she was drinking half a gallon daily. She states trying to come off she experienced a seizure. She endorses sweats, "shakes" nausea, diarrhea. She was started on Ativan detox. She had been prescribed Paxil that she wants to pursue. She also endorses back pain for what she takes Neurontin  Diagnosis:   DSM5: Substance/Addictive Disorders:  Alcohol Related Disorder - Severe (303.90) Depressive Disorders:  Major Depressive Disorder - Moderate (296.22) Total Time spent with patient: 25 minutes  Axis I: Substance Induced Mood Disorder  ADL's:  Intact  Sleep: Poor  Psychiatric Specialty Exam: Physical Exam  Review of Systems  Constitutional: Positive for malaise/fatigue and diaphoresis.  HENT: Negative.   Eyes: Negative.   Respiratory: Negative.   Cardiovascular: Negative.   Gastrointestinal: Positive for nausea and diarrhea.  Genitourinary: Negative.   Musculoskeletal: Positive for back pain.  Skin: Negative.   Neurological: Positive for tremors and weakness.  Endo/Heme/Allergies: Negative.   Psychiatric/Behavioral: Positive for substance abuse. The patient is nervous/anxious and has insomnia.     Blood pressure 123/74, pulse 91, temperature 98.2 F (36.8 C), temperature source Oral, resp. rate 20, height 5\' 7"  (1.702 m), weight 145.151 kg (320 lb), last menstrual period 03/12/2014, SpO2 98.00%.Body mass index is 50.11 kg/(m^2).  General Appearance: Disheveled  Eye Sport and exercise psychologist::  Fair  Speech:  Clear and Coherent  Volume:  Normal  Mood:  Euthymic  Affect:   Appropriate and Congruent  Thought Process:  Coherent and Goal Directed  Orientation:  Full (Time, Place, and Person)  Thought Content:  WDL  Suicidal Thoughts:  No  Homicidal Thoughts:  No  Memory:  Immediate;   Fair Recent;   Fair Remote;   Fair  Judgement:  Fair  Insight:  Present and Shallow  Psychomotor Activity:  Normal  Concentration:  Fair  Recall:  [employer] of Knowledge:NA  Language: Fair  Akathisia:  No  Handed:    AIMS (if indicated):     Assets:  Desire for Improvement Housing  Sleep:      Musculoskeletal: Strength & Muscle Tone: within normal limits Gait & Station: normal Patient leans: N/A  Current Medications: Current Facility-Administered Medications  Medication Dose Route Frequency Provider Last Rate Last Dose  . acetaminophen (TYLENOL) tablet 650 mg  650 mg Oral Q6H PRN Mojeed Akintayo      . alum & mag hydroxide-simeth (MAALOX/MYLANTA) 200-200-20 MG/5ML suspension 30 mL  30 mL Oral Q4H PRN Mojeed Akintayo      . folic acid (FOLVITE) tablet 1 mg  1 mg Oral Daily Mojeed Akintayo   1 mg at 04/05/14 0732  . gabapentin (NEURONTIN) capsule 800 mg  800 mg Oral QID Mojeed Akintayo   800 mg at 04/05/14 0731  . hydrOXYzine (ATARAX/VISTARIL) tablet 25 mg  25 mg Oral Q6H PRN Hampton Abbot, MD   25 mg at 04/05/14 1057  . ibuprofen (ADVIL,MOTRIN) tablet 800 mg  800 mg Oral Q8H PRN Mojeed Akintayo   800 mg at 04/04/14 2045  . levothyroxine (SYNTHROID, LEVOTHROID) tablet 50 mcg  50 mcg Oral QAC  breakfast Mojeed Akintayo   50 mcg at 04/05/14 0636  . loperamide (IMODIUM) capsule 4 mg  4 mg Oral TID PRN Mojeed Akintayo   4 mg at 04/04/14 1842  . LORazepam (ATIVAN) tablet 1 mg  1 mg Oral BID Hampton Abbot, MD   1 mg at 04/05/14 0732   Followed by  . [START ON 04/06/2014] LORazepam (ATIVAN) tablet 1 mg  1 mg Oral Daily Hampton Abbot, MD      . magnesium hydroxide (MILK OF MAGNESIA) suspension 30 mL  30 mL Oral Daily PRN Mojeed Akintayo      . multivitamin with minerals  tablet 1 tablet  1 tablet Oral Daily Hampton Abbot, MD   1 tablet at 04/05/14 0732  . nicotine (NICODERM CQ - dosed in mg/24 hours) patch 21 mg  21 mg Transdermal Daily Nicholaus Bloom, MD   21 mg at 04/05/14 0736  . ondansetron (ZOFRAN-ODT) disintegrating tablet 4 mg  4 mg Oral Q6H PRN Hampton Abbot, MD   4 mg at 04/05/14 0738  . PARoxetine (PAXIL) tablet 40 mg  40 mg Oral Daily Nicholaus Bloom, MD   40 mg at 04/05/14 0731  . thiamine (VITAMIN B-1) tablet 100 mg  100 mg Oral Daily Hampton Abbot, MD   100 mg at 04/05/14 0731  . zolpidem (AMBIEN) tablet 10 mg  10 mg Oral QHS PRN Nicholaus Bloom, MD   10 mg at 04/04/14 2106    Lab Results:  No results found for this or any previous visit (from the past 60 hour(s)).  Physical Findings: AIMS: Facial and Oral Movements Muscles of Facial Expression: None, normal Lips and Perioral Area: None, normal Jaw: None, normal Tongue: None, normal,Extremity Movements Upper (arms, wrists, hands, fingers): None, normal Lower (legs, knees, ankles, toes): None, normal, Trunk Movements Neck, shoulders, hips: None, normal, Overall Severity Severity of abnormal movements (highest score from questions above): None, normal Incapacitation due to abnormal movements: None, normal Patient's awareness of abnormal movements (rate only patient's report): No Awareness, Dental Status Current problems with teeth and/or dentures?: No Does patient usually wear dentures?: No  CIWA:  CIWA-Ar Total: 4 COWS:  COWS Total Score: 3  Suicide Risk Assessment   Nursing information obtained from:  Patient;Review of record Demographic factors:  Divorced or widowed;Caucasian Current Mental Status:  NA Loss Factors:  NA Historical Factors:  Impulsivity Risk Reduction Factors:  Responsible for children under 35 years of age Total Time spent with patient: 25 minutes  CLINICAL FACTORS:   Depression:   Anhedonia Hopelessness Impulsivity Alcohol/Substance  Abuse/Dependencies  Psychiatric Specialty Exam:     Blood pressure 123/74, pulse 91, temperature 98.2 F (36.8 C), temperature source Oral, resp. rate 20, height 5\' 7"  (1.702 m), weight 145.151 kg (320 lb), last menstrual period 03/12/2014, SpO2 98.00%.Body mass index is 50.11 kg/(m^2).  SEE PSE ABOVE  COGNITIVE FEATURES THAT CONTRIBUTE TO RISK:  Closed-mindedness    SUICIDE RISK:   Minimal: No identifiable suicidal ideation.  Patients presenting with no risk factors but with morbid ruminations; may be classified as minimal risk based on the severity of the depressive symptoms  Treatment Plan Summary: Discharge home with outpatient referrals Supportive approach/coping skills/relapse prevention Paxil 40 mg daily Vistaril 25mg  q6h PRN anxiety Trazodone 50mg  qhs PRN insomnia       Benjamine Mola, FNP-BC 04/05/2014, 11:29 AM    Evaluated patient face to face agree with assessment and plan Geralyn Flash A. Sabra Heck, M.D.

## 2014-04-05 NOTE — Discharge Instructions (Addendum)
To help you maintain a sober lifestyle you may benefit from seeing a licensed clinical addiction specialist (LCAS).  You have indicated to the Observation Unit staff that you are familiar with several that practice in the [place] area.  You are advised to call one at your earliest convenience to schedule an appointment.  You are also advised to attend 12-Step meetings, and have indicated that you know how to find a meeting near you.

## 2014-04-05 NOTE — Progress Notes (Signed)
Pt D/C home as per order. Cooperative with d/c procedure. D/C instructions reviewed with pt as well as d/c proscription and pt. verbalized understanding. Q 15 mins. Obs. maintained for safety till d/c.

## 2014-04-05 NOTE — Progress Notes (Signed)
D) Pt alert and oriented X 4. Reported feeling anxious & c/o nausea at time of AM assessment. Pt made her request for her AM Ativan right on initial approach with Probation officer. PRN Zofran 4mg  given at 0738 for nausea, med effective. Contracts for safety while in observation unit. A) All meds given as scheduled. Emotional support and availability offered. Safety checks maintained as ordered. A) Pt receptive to care, verbally engaged with staff, got needs meet safely.

## 2014-04-05 NOTE — Progress Notes (Signed)
Patient woke up around 0100 stating she had nausea and anxiety.  Prn vistaril administered.  Patient cooperative at the time and she went back to sleep.

## 2014-04-11 ENCOUNTER — Encounter (HOSPITAL_COMMUNITY): Payer: Self-pay | Admitting: Emergency Medicine

## 2014-04-11 ENCOUNTER — Emergency Department (HOSPITAL_COMMUNITY)
Admission: EM | Admit: 2014-04-11 | Discharge: 2014-04-12 | Disposition: A | Discharge status: Home / Self Care | Payer: Medicare Other | Attending: Emergency Medicine | Admitting: Emergency Medicine

## 2014-04-11 DIAGNOSIS — Z8544 Personal history of malignant neoplasm of other female genital organs: Secondary | ICD-10-CM | POA: Diagnosis not present

## 2014-04-11 DIAGNOSIS — IMO0002 Reserved for concepts with insufficient information to code with codable children: Secondary | ICD-10-CM

## 2014-04-11 DIAGNOSIS — F1010 Alcohol abuse, uncomplicated: Secondary | ICD-10-CM

## 2014-04-11 DIAGNOSIS — Z880 Allergy status to penicillin: Secondary | ICD-10-CM | POA: Insufficient documentation

## 2014-04-11 DIAGNOSIS — F17200 Nicotine dependence, unspecified, uncomplicated: Secondary | ICD-10-CM | POA: Diagnosis not present

## 2014-04-11 DIAGNOSIS — E039 Hypothyroidism, unspecified: Secondary | ICD-10-CM | POA: Diagnosis not present

## 2014-04-11 DIAGNOSIS — Z862 Personal history of diseases of the blood and blood-forming organs and certain disorders involving the immune mechanism: Secondary | ICD-10-CM | POA: Insufficient documentation

## 2014-04-11 DIAGNOSIS — Z3202 Encounter for pregnancy test, result negative: Secondary | ICD-10-CM | POA: Diagnosis not present

## 2014-04-11 DIAGNOSIS — K219 Gastro-esophageal reflux disease without esophagitis: Secondary | ICD-10-CM | POA: Insufficient documentation

## 2014-04-11 DIAGNOSIS — F411 Generalized anxiety disorder: Secondary | ICD-10-CM | POA: Insufficient documentation

## 2014-04-11 DIAGNOSIS — Z79899 Other long term (current) drug therapy: Secondary | ICD-10-CM | POA: Diagnosis not present

## 2014-04-11 DIAGNOSIS — F10230 Alcohol dependence with withdrawal, uncomplicated: Secondary | ICD-10-CM | POA: Diagnosis present

## 2014-04-11 DIAGNOSIS — R112 Nausea with vomiting, unspecified: Secondary | ICD-10-CM | POA: Insufficient documentation

## 2014-04-11 DIAGNOSIS — F14122 Cocaine abuse with intoxication with perceptual disturbance: Secondary | ICD-10-CM | POA: Diagnosis present

## 2014-04-11 DIAGNOSIS — R197 Diarrhea, unspecified: Secondary | ICD-10-CM | POA: Insufficient documentation

## 2014-04-11 DIAGNOSIS — G8929 Other chronic pain: Secondary | ICD-10-CM | POA: Diagnosis not present

## 2014-04-11 DIAGNOSIS — F1410 Cocaine abuse, uncomplicated: Secondary | ICD-10-CM

## 2014-04-11 DIAGNOSIS — Z9981 Dependence on supplemental oxygen: Secondary | ICD-10-CM | POA: Diagnosis not present

## 2014-04-11 DIAGNOSIS — R079 Chest pain, unspecified: Secondary | ICD-10-CM | POA: Insufficient documentation

## 2014-04-11 DIAGNOSIS — M1710 Unilateral primary osteoarthritis, unspecified knee: Secondary | ICD-10-CM | POA: Diagnosis not present

## 2014-04-11 DIAGNOSIS — R0789 Other chest pain: Secondary | ICD-10-CM | POA: Diagnosis not present

## 2014-04-11 DIAGNOSIS — R1084 Generalized abdominal pain: Secondary | ICD-10-CM

## 2014-04-11 DIAGNOSIS — G4730 Sleep apnea, unspecified: Secondary | ICD-10-CM | POA: Insufficient documentation

## 2014-04-11 DIAGNOSIS — G609 Hereditary and idiopathic neuropathy, unspecified: Secondary | ICD-10-CM | POA: Insufficient documentation

## 2014-04-11 LAB — I-STAT TROPONIN, ED: Troponin i, poc: 0 ng/mL (ref 0.00–0.08)

## 2014-04-11 LAB — COMPREHENSIVE METABOLIC PANEL
ALT: 34 U/L (ref 0–35)
AST: 22 U/L (ref 0–37)
Albumin: 3.9 g/dL (ref 3.5–5.2)
Alkaline Phosphatase: 70 U/L (ref 39–117)
Anion gap: 15 (ref 5–15)
BUN: 10 mg/dL (ref 6–23)
CALCIUM: 9.2 mg/dL (ref 8.4–10.5)
CO2: 25 mEq/L (ref 19–32)
CREATININE: 0.9 mg/dL (ref 0.50–1.10)
Chloride: 100 mEq/L (ref 96–112)
GFR calc non Af Amer: 80 mL/min — ABNORMAL LOW (ref >90)
GLUCOSE: 111 mg/dL — AB (ref 70–99)
Potassium: 4.1 mEq/L (ref 3.7–5.3)
Sodium: 140 mEq/L (ref 137–147)
Total Bilirubin: 0.3 mg/dL (ref 0.3–1.2)
Total Protein: 7.7 g/dL (ref 6.0–8.3)

## 2014-04-11 LAB — CBC
HCT: 41.4 % (ref 36.0–46.0)
Hemoglobin: 14.2 g/dL (ref 12.0–15.0)
MCH: 32.1 pg (ref 26.0–34.0)
MCHC: 34.3 g/dL (ref 30.0–36.0)
MCV: 93.7 fL (ref 78.0–100.0)
Platelets: 221 10*3/uL (ref 150–400)
RBC: 4.42 MIL/uL (ref 3.87–5.11)
RDW: 14.4 % (ref 11.5–15.5)
WBC: 12.7 10*3/uL — AB (ref 4.0–10.5)

## 2014-04-11 LAB — RAPID URINE DRUG SCREEN, HOSP PERFORMED
Amphetamines: NOT DETECTED
Barbiturates: NOT DETECTED
Benzodiazepines: NOT DETECTED
COCAINE: POSITIVE — AB
OPIATES: NOT DETECTED
Tetrahydrocannabinol: NOT DETECTED

## 2014-04-11 LAB — ETHANOL: ALCOHOL ETHYL (B): 211 mg/dL — AB (ref 0–11)

## 2014-04-11 LAB — POC URINE PREG, ED: Preg Test, Ur: NEGATIVE

## 2014-04-11 MED ORDER — LORAZEPAM 1 MG PO TABS
0.0000 mg | ORAL_TABLET | Freq: Four times a day (QID) | ORAL | Status: DC
  Administered 2014-04-12: 2 mg via ORAL
  Filled 2014-04-11: qty 2
  Filled 2014-04-11: qty 1

## 2014-04-11 MED ORDER — VITAMIN B-1 100 MG PO TABS
100.0000 mg | ORAL_TABLET | Freq: Every day | ORAL | Status: DC
  Administered 2014-04-12: 100 mg via ORAL
  Filled 2014-04-11: qty 1

## 2014-04-11 MED ORDER — SODIUM CHLORIDE 0.9 % IV BOLUS (SEPSIS)
1000.0000 mL | Freq: Once | INTRAVENOUS | Status: AC
  Administered 2014-04-11: 1000 mL via INTRAVENOUS

## 2014-04-11 MED ORDER — LORAZEPAM 1 MG PO TABS
0.0000 mg | ORAL_TABLET | Freq: Two times a day (BID) | ORAL | Status: DC
  Administered 2014-04-12: 1 mg via ORAL
  Filled 2014-04-11: qty 1

## 2014-04-11 MED ORDER — DOXYCYCLINE HYCLATE 100 MG PO TABS
100.0000 mg | ORAL_TABLET | Freq: Two times a day (BID) | ORAL | Status: DC
  Administered 2014-04-12 (×2): 100 mg via ORAL
  Filled 2014-04-11 (×2): qty 1

## 2014-04-11 MED ORDER — LORAZEPAM 2 MG/ML IJ SOLN
1.0000 mg | Freq: Once | INTRAMUSCULAR | Status: AC
  Administered 2014-04-11: 1 mg via INTRAVENOUS
  Filled 2014-04-11: qty 1

## 2014-04-11 MED ORDER — THIAMINE HCL 100 MG/ML IJ SOLN: 100.0000 mg | Freq: Every day | INTRAMUSCULAR | Status: DC

## 2014-04-11 MED ORDER — LORAZEPAM 2 MG/ML IJ SOLN: 0.0000 mg | Freq: Two times a day (BID) | INTRAMUSCULAR | Status: DC

## 2014-04-11 MED ORDER — LORAZEPAM 2 MG/ML IJ SOLN: 0.0000 mg | Freq: Four times a day (QID) | INTRAMUSCULAR | Status: DC

## 2014-04-11 NOTE — ED Notes (Signed)
IV est, IVF started. Pt requesting additional sandwich, additional Ativan and Nicotine patch (the strongest one). Pt educated on importance of being truthful about cocaine usage especially with c/o chest pain d/t contraindication of beta blockers that are commonly given. Pt verbalized understanding.

## 2014-04-11 NOTE — ED Notes (Signed)
Pt requesting pain medication, explained to patient Ativan ordered. Call bell within reach, will reassess pain.

## 2014-04-11 NOTE — H&P (Signed)
[facility] H [facility] P  04/04/2014 10:13 AM [name] [name]  MRN:  [id number] Subjective:  38 Y/o female who states that after one year sober she relapsed on alcohol. She states she was drinking half a gallon daily. She states trying to come off she experienced a seizure. She endorses sweats, "shakes" nausea, diarrhea. She was started on Ativan detox. She had been prescribed Paxil that she wants to pursue. 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 Patient is endorsing worsening anxiety, muscle spasm, night sweats, shakiness, clamminess, headache, and back ache. She denies SI, HI, AVH but rates her anxiety 10/10 and depression 8/10. Patient appears clammy and restless. 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 Her last rehab was a year ago at [facility]. Last night, she had her first withdrawal seizure prior to coming here. She takes Paxil 40 mg daily for depression from her PCP. [name] shares custody of her 19 year old daughter with her ex-husband.    Diagnosis:   DSM5: Substance/Addictive Disorders:  Alcohol Related Disorder - Severe (303.90) Depressive Disorders:  Major Depressive Disorder - Moderate (296.22) Total Time spent with patient: 30 minutes  Axis I: Substance Induced Mood Disorder  ADL's:  Intact  Sleep: Poor  Psychiatric Specialty Exam: Physical Exam  Review of Systems  Constitutional: Positive for malaise/fatigue and diaphoresis.  HENT: Negative.   Eyes: Negative.   Respiratory: Negative.   Cardiovascular: Negative.   Gastrointestinal: Positive for nausea and diarrhea.  Genitourinary: Negative.   Musculoskeletal: Positive for back pain.  Skin: Negative.   Neurological: Positive for tremors and weakness.  Endo/Heme/Allergies: Negative.   Psychiatric/Behavioral: Positive for substance  abuse. The patient is nervous/anxious and has insomnia.     Blood pressure 123/74, pulse 91, temperature 98.2 F (36.8 C), temperature source Oral, resp. rate 20, height 5\' 7"  (1.702 m), weight 145.151 kg (320 lb), last menstrual period 03/12/2014, SpO2 98.00%.Body mass index is 50.11 kg/(m^2).  General Appearance: Disheveled  Eye Sport and exercise psychologist::  Fair  Speech:  Clear and Coherent  Volume:  fluctuates  Mood:  Anxious and worried  Affect:  anxious, worried  Thought Process:  Coherent and Goal Directed  Orientation:  Full (Time, Place, and Person)  Thought Content:  events, symtpoms worries concerns  Suicidal Thoughts:  No  Homicidal Thoughts:  No  Memory:  Immediate;   Fair Recent;   Fair Remote;   Fair  Judgement:  Fair  Insight:  Present and Shallow  Psychomotor Activity:  Restlessness  Concentration:  Fair  Recall:  [employer] of Knowledge:NA  Language: Fair  Akathisia:  No  Handed:    AIMS (if indicated):     Assets:  Desire for Improvement Housing  Sleep:      Musculoskeletal: Strength & Muscle Tone: within normal limits Gait & Station: normal Patient leans: N/A  Current Medications: No current facility-administered medications for this encounter.   Current Outpatient Prescriptions  Medication Sig Dispense Refill  . ibuprofen (ADVIL,MOTRIN) 800 MG tablet Take 1 tablet (800 mg total) by mouth every 8 (eight) hours as needed for moderate pain. Take with food.  30 tablet  0  . gabapentin (NEURONTIN) 400 MG capsule Take 2 capsules (800 mg total) by mouth 4 (four) times daily.  240 capsule  0  . hydrOXYzine (ATARAX/VISTARIL) 25 MG  tablet Take 1 tablet (25 mg total) by mouth every 6 (six) hours as needed (anxiety/agitation or CIWA < or = 10).  14 tablet  0  . levothyroxine (SYNTHROID, LEVOTHROID) 50 MCG tablet Take 1 tablet (50 mcg total) by mouth daily before breakfast.  30 tablet  1  . PARoxetine (PAXIL) 40 MG tablet Take 1 tablet (40 mg total) by mouth daily. Due for follow  up w/Dr Laney Pastor  30 tablet  0  . traZODone (DESYREL) 50 MG tablet Take 1 tablet (50 mg total) by mouth at bedtime as needed for sleep.  7 tablet  0    Lab Results:  No results found for this or any previous visit (from the past 48 hour(s)).  Physical Findings: AIMS: Facial and Oral Movements Muscles of Facial Expression: None, normal Lips and Perioral Area: None, normal Jaw: None, normal Tongue: None, normal,Extremity Movements Upper (arms, wrists, hands, fingers): None, normal Lower (legs, knees, ankles, toes): None, normal, Trunk Movements Neck, shoulders, hips: None, normal, Overall Severity Severity of abnormal movements (highest score from questions above): None, normal Incapacitation due to abnormal movements: None, normal Patient's awareness of abnormal movements (rate only patient's report): No Awareness, Dental Status Current problems with teeth and/or dentures?: No Does patient usually wear dentures?: No  CIWA:  CIWA-Ar Total: 1 COWS:  COWS Total Score: 3  Treatment Plan Summary: Daily contact with patient to assess and evaluate symptoms and progress in treatment Medication management  Plan: Supportive approach/coping skills/relapse prevention           Pursue Ativan Detox           Resume Paxil 40 mg daily  Medical Decision Making Problem Points:  Established problem, worsening (2) and Review of psycho-social stressors (1) Data Points:  Review of medication regiment & side effects (2)    [name] 04/04/2014, 10:13 AM

## 2014-04-11 NOTE — ED Notes (Signed)
Pt presents with R sided chest and jaw pain onset yesterday 8/10, HA onset yesterday 10/10 pain. Pt reports she was seen last year and found to have "sky high" troponin, cardiac cath WNL per pt.  Pt states she was released from rehab last week for etoh. Pt states she relapsed [place] before yesterday, pt reports drinking 4-5 beers daily. Pt denies SI/HI.  Pt placed on CCM, EKG done in triage. Pt also placed on 02 d/t reports of SHOB, pt appears anxious and requesting staff stay at bedside.

## 2014-04-11 NOTE — ED Notes (Signed)
Pt reports cp which yesterday, states worse today.  Pt describes cp as pressure pain.  Pt states she does not know if it's from anxiety.  Pt does have anxiety.  Pt is also requesting detox from alcohol.  Was seen here for same last week and states that she relapsed.

## 2014-04-11 NOTE — ED Provider Notes (Signed)
CSN: [id number]     Arrival date & time 04/11/14  2023 History   First MD Initiated Contact with Patient 04/11/14 2043     Chief Complaint  Patient presents with  . Chest Pain  . Drug / Alcohol Assessment     HPI [name] is a 38 y.o. female with PMH of ETOH abuse, narcotic abuse, anxiety presenting with chest pain that started last night. It is pressure, does not radiate and has increased in intensity and is persistent. Patient has had similar pain. Patient had a negative cardiac cath 03/2013. Patient has epigastric and RUQ and RLQ abdominal pain. Nausea, vomiting and is unable to keep anything down. Dry heaves, no blood. Patient with similar presentation 03/2013 with negative CT. Diarrhea without blood. No fevers or chills. Patient reports last drink was today she had 4 12oz beers and 8 12oz beers yesterday. She has been clean for over a year but started drinking again this month. She is presenting for detox and presented last week for same thing. Patient smokes and is obese. Chronic back pain. HA with gradual onset and is similar to typical headaches. Patient without visual, speech or weakness. Pt denies SI, HI.    Past Medical History  Diagnosis Date  . Narcotic abuse in remission   . Thyroid disease   . Chronic back pain   . Hypothyroidism   . Anxiety   . GERD (gastroesophageal reflux disease)   . Sleep apnea     cpap  . Headache(784.0)   . Neuromuscular disorder     neuropathy  . Arthritis     knees bilateral   DJD  stenosis   . Blood dyscrasia     HSV  . Cancer     vaginal  malignant carcinoma  . PCOS (polycystic ovarian syndrome)    Past Surgical History  Procedure Laterality Date  . Back surgery    . Cesarean section    . Tonsillectomy    . Tubal ligation    . Fracture surgery     Family History  Problem Relation Age of Onset  . Arthritis Mother   . Clotting disorder Mother   . Hyperlipidemia Father   . Leukemia Maternal Grandmother   . Diabetes Maternal  Grandfather   . Stroke Paternal Grandmother   . Heart disease Father   . Diabetes Mother   . Hypertension Mother   . Prostate cancer Father   . Heart disease Paternal Grandmother    History  Substance Use Topics  . Smoking status: Current Every [employer] Smoker -- 0.50 packs/Runco for 20 years    Types: Cigarettes  . Smokeless tobacco: Never Used  . Alcohol Use: Yes     Comment: heavy 1/2 gallon vodka daily   OB History   Grav Para Term Preterm Abortions TAB SAB Ect Mult Living                 Review of Systems  Constitutional: Negative for fever and chills.  HENT: Negative for congestion and rhinorrhea.   Eyes: Negative for visual disturbance.  Respiratory: Negative for cough and shortness of breath.   Cardiovascular: Positive for chest pain. Negative for palpitations.  Gastrointestinal: Positive for nausea, vomiting, abdominal pain and diarrhea.  Genitourinary: Negative for dysuria and hematuria.  Musculoskeletal: Negative for back pain and gait problem.  Skin: Negative for rash.  Neurological: Negative for weakness and headaches.      Allergies  Avelox; Levofloxacin; Penicillins; and Quinolones  Home Medications  Prior to Admission medications   Medication Sig Start Date End Date Taking? Authorizing Provider  gabapentin (NEURONTIN) 400 MG capsule Take 2 capsules (800 mg total) by mouth 4 (four) times daily. 04/05/14  Yes Benjamine Mola, FNP  ibuprofen (ADVIL,MOTRIN) 800 MG tablet Take 1 tablet (800 mg total) by mouth every 8 (eight) hours as needed for moderate pain. Take with food. 01/24/14  Yes Wendie Agreste, MD  levothyroxine (SYNTHROID, LEVOTHROID) 50 MCG tablet Take 1 tablet (50 mcg total) by mouth daily before breakfast. 04/05/14  Yes Benjamine Mola, FNP  LORazepam (ATIVAN) 2 MG tablet Take 2 mg by mouth every 6 (six) hours as needed for anxiety.   Yes Historical Provider, MD  PARoxetine (PAXIL) 40 MG tablet Take 1 tablet (40 mg total) by mouth daily. Due for follow up  w/Dr Laney Pastor 04/05/14  Yes Benjamine Mola, FNP  zolpidem (AMBIEN) 10 MG tablet Take 10 mg by mouth at bedtime as needed for sleep.   Yes Historical Provider, MD   BP 130/70  Pulse 93  Temp(Src) 98.3 F (36.8 C) (Oral)  Resp 16  SpO2 96%  LMP 03/12/2014 Physical Exam  Nursing note and vitals reviewed. Constitutional: She appears well-developed and well-nourished. No distress.  HENT:  Head: Normocephalic and atraumatic.  Mouth/Throat: Oropharynx is clear and moist.  Eyes: Conjunctivae and EOM are normal. Pupils are equal, round, and reactive to light. Right eye exhibits no discharge. Left eye exhibits no discharge.  Cardiovascular: Normal rate, regular rhythm and normal heart sounds.   Pulmonary/Chest: Effort normal and breath sounds normal. No respiratory distress. She has no wheezes.  Abdominal: Soft. Bowel sounds are normal. She exhibits no distension.  Diffuse abdominal tenderness without rebound or guarding.  Neurological: She is alert. No cranial nerve deficit. She exhibits normal muscle tone. Coordination normal.  Strength 5/5 in upper and lower extremities. Sensation intact. Intact rapid alternating movements, finger to nose, and heel to shin. Negative Romberg. Normal gait.   Skin: Skin is warm and dry. She is not diaphoretic.    ED Course  Procedures (including critical care time) Labs Review Labs Reviewed  CBC - Abnormal; Notable for the following:    WBC 12.7 (*)    All other components within normal limits  COMPREHENSIVE METABOLIC PANEL - Abnormal; Notable for the following:    Glucose, Bld 111 (*)    GFR calc non Af Amer 80 (*)    All other components within normal limits  ETHANOL - Abnormal; Notable for the following:    Alcohol, Ethyl (B) 211 (*)    All other components within normal limits  URINE RAPID DRUG SCREEN (HOSP PERFORMED) - Abnormal; Notable for the following:    Cocaine POSITIVE (*)    All other components within normal limits  I-STAT TROPOININ, ED   POC URINE PREG, ED    Imaging Review No results found.   EKG Interpretation   Date/Time:  Wednesday April 11 2014 20:30:45 EDT Ventricular Rate:  97 PR Interval:  125 QRS Duration: 89 QT Interval:  346 QTC Calculation: 439 R Axis:   43 Text Interpretation:  Sinus rhythm No significant change since last  tracing Confirmed by KNAPP  MD-J, JON ([id number]) on 04/11/2014 8:36:59 PM      Meds given in ED:  Medications  doxycycline (VIBRA-TABS) tablet 100 mg (100 mg Oral Given 04/12/14 0028)  LORazepam (ATIVAN) tablet 0-4 mg (not administered)  LORazepam (ATIVAN) tablet 0-4 mg (1 mg Oral Given 04/12/14 0028)  LORazepam (ATIVAN) injection 0-4 mg (not administered)  LORazepam (ATIVAN) injection 0-4 mg (not administered)  thiamine (VITAMIN B-1) tablet 100 mg (not administered)  thiamine (B-1) injection 100 mg (not administered)  nicotine (NICODERM CQ - dosed in mg/24 hours) patch 21 mg (21 mg Transdermal Patch Applied 04/12/14 0029)  LORazepam (ATIVAN) injection 1 mg (1 mg Intravenous Given 04/11/14 2217)  sodium chloride 0.9 % bolus 1,000 mL (1,000 mLs Intravenous New Bag/Given 04/11/14 2352)    New Prescriptions   No medications on file      MDM   Final diagnoses:  ETOH abuse  Cocaine abuse  Atypical chest pain  Diffuse abdominal pain   Patient presenting for ETOH detox. Patient with positive cocaine.  Atypical chest pain with negative troponin and EKG and negative cardiac cath 03/2013. Low suspicion for coronary ischemia. Diffuse abdominal pain with nausea and vomiting associated with alcohol use. Similar presentation 03/2013 with normal CT. Patient afebrile with benign abdominal exam. I doubt surgical abdomen.  HA gradual in onset, typical of normal headaches and without focal neurological deficits  Patient medically cleared for CIWA.   Discussed all results and patient verbalizes understanding and agrees with plan.  This is a shared patient. This patient was discussed  with the physician, Dr. Alvino Chapel who saw and evaluated the patient and agrees with the plan.      Pura Spice, PA-C 04/12/14 [phone]

## 2014-04-11 NOTE — ED Notes (Signed)
Pt was given a [place] sandwich and ginger ale with RN permission.

## 2014-04-12 ENCOUNTER — Encounter (HOSPITAL_COMMUNITY): Payer: Self-pay | Admitting: Registered Nurse

## 2014-04-12 DIAGNOSIS — F14122 Cocaine abuse with intoxication with perceptual disturbance: Secondary | ICD-10-CM | POA: Diagnosis present

## 2014-04-12 LAB — CLOSTRIDIUM DIFFICILE BY PCR: CDIFFPCR: NEGATIVE

## 2014-04-12 MED ORDER — HYDROXYZINE HCL 25 MG PO TABS: 25.0000 mg | ORAL_TABLET | Freq: Four times a day (QID) | ORAL | Status: DC | PRN

## 2014-04-12 MED ORDER — TRAZODONE HCL 50 MG PO TABS: 50.0000 mg | ORAL_TABLET | Freq: Every evening | ORAL | Status: DC | PRN

## 2014-04-12 MED ORDER — PAROXETINE HCL 20 MG PO TABS
40.0000 mg | ORAL_TABLET | Freq: Every day | ORAL | Status: DC
  Filled 2014-04-12 (×2): qty 2

## 2014-04-12 MED ORDER — ONDANSETRON 4 MG PO TBDP
4.0000 mg | ORAL_TABLET | Freq: Four times a day (QID) | ORAL | Status: DC | PRN
  Administered 2014-04-12: 4 mg via ORAL
  Filled 2014-04-12: qty 1

## 2014-04-12 MED ORDER — GABAPENTIN 400 MG PO CAPS
800.0000 mg | ORAL_CAPSULE | Freq: Four times a day (QID) | ORAL | Status: DC
  Administered 2014-04-12: 800 mg via ORAL
  Filled 2014-04-12: qty 2

## 2014-04-12 MED ORDER — CHLORDIAZEPOXIDE HCL 25 MG PO CAPS
25.0000 mg | ORAL_CAPSULE | Freq: Once | ORAL | Status: AC
  Administered 2014-04-12: 25 mg via ORAL
  Filled 2014-04-12: qty 1

## 2014-04-12 MED ORDER — LOPERAMIDE HCL 2 MG PO CAPS: 2.0000 mg | ORAL_CAPSULE | ORAL | Status: DC | PRN

## 2014-04-12 MED ORDER — ADULT MULTIVITAMIN W/MINERALS CH: 1.0000 | ORAL_TABLET | Freq: Every day | ORAL | Status: DC

## 2014-04-12 MED ORDER — CHLORDIAZEPOXIDE HCL 25 MG PO CAPS: 25.0000 mg | ORAL_CAPSULE | Freq: Four times a day (QID) | ORAL | Status: DC | PRN

## 2014-04-12 MED ORDER — LEVOTHYROXINE SODIUM 50 MCG PO TABS
50.0000 ug | ORAL_TABLET | Freq: Every day | ORAL | Status: DC
  Filled 2014-04-12: qty 1

## 2014-04-12 MED ORDER — ZOLPIDEM TARTRATE 10 MG PO TABS: 10.0000 mg | ORAL_TABLET | Freq: Every evening | ORAL | Status: DC | PRN

## 2014-04-12 MED ORDER — NICOTINE 21 MG/24HR TD PT24
21.0000 mg | MEDICATED_PATCH | Freq: Once | TRANSDERMAL | Status: DC
  Administered 2014-04-12: 21 mg via TRANSDERMAL
  Filled 2014-04-12: qty 1

## 2014-04-12 NOTE — ED Notes (Signed)
Patient complains of diarrhea. Patient reports several episodes. Attempted to call Emergency room doctor unable to reach at this time. Will try again.

## 2014-04-12 NOTE — Consult Note (Signed)
[facility] Face-to-Face Psychiatry Consult   Reason for Consult:  Alcohol and cocaine abuse Referring Physician:  EDP  [name] is an 38 y.o. female. Total Time spent with patient: 45 minutes  Assessment: AXIS I:  Alcohol Abuse, Anxiety Disorder NOS, Substance Abuse and Substance Induced Mood Disorder AXIS II:  Deferred AXIS III:   Past Medical History  Diagnosis Date  . Narcotic abuse in remission   . Thyroid disease   . Chronic back pain   . Hypothyroidism   . Anxiety   . GERD (gastroesophageal reflux disease)   . Sleep apnea     cpap  . Headache(784.0)   . Neuromuscular disorder     neuropathy  . Arthritis     knees bilateral   DJD  stenosis   . Blood dyscrasia     HSV  . Cancer     vaginal  malignant carcinoma  . PCOS (polycystic ovarian syndrome)    AXIS IV:  other psychosocial or environmental problems AXIS V:  61-70 mild symptoms  Plan:  No evidence of imminent risk to self or others at present.   Patient does not meet criteria for psychiatric inpatient admission. Supportive therapy provided about ongoing stressors. Refer to IOP. Discussed crisis plan, support from social network, calling 911, coming to the Emergency Department, and calling Suicide Hotline.  Subjective:   [name] is a 38 y.o. female patient.  HPI:  Patient states that she is having an increase in anxiety and needs Ativan.  "The last time I got out my anxiety was just really bad and I just started drinking again."  Patient states that Ativan is the only thing that helps with her anxiety.  Discussed with patient that she would need to get outpatient services set up so that she could receive medications.  Patient states that she has services that can use where she works but is unsure if she wants to discuss things with her co-workers.  Patient referred to [facility] and patient refused services.  Patient is denying suicidal/homicidal ideation, psychosis, and paranoia.  Patient is denies withdrawal  symptoms but is expressing increased anxiety and requesting 2 mg Ativan Q 4 hours.    HPI Elements:   Location:  Polysubstance abuse. Quality:  alcohol intoxication. Severity:  substance induced mood disorder. Timing:  1 week. Review of Systems  Constitutional: Negative for weight loss, malaise/fatigue and diaphoresis.  HENT: Negative.   Respiratory: Negative.   Musculoskeletal: Negative.   Neurological: Negative for dizziness, tingling, tremors and loss of consciousness. Seizures: States that she has a history of seizure with alcohol.  Psychiatric/Behavioral: Positive for substance abuse. Negative for depression, suicidal ideas, hallucinations and memory loss. The patient is nervous/anxious and has insomnia.   All other systems reviewed and are negative.  Family History  Problem Relation Age of Onset  . Arthritis Mother   . Clotting disorder Mother   . Hyperlipidemia Father   . Leukemia Maternal Grandmother   . Diabetes Maternal Grandfather   . Stroke Paternal Grandmother   . Heart disease Father   . Diabetes Mother   . Hypertension Mother   . Prostate cancer Father   . Heart disease Paternal Grandmother     Past Psychiatric History: Past Medical History  Diagnosis Date  . Narcotic abuse in remission   . Thyroid disease   . Chronic back pain   . Hypothyroidism   . Anxiety   . GERD (gastroesophageal reflux disease)   . Sleep apnea  cpap  . Headache(784.0)   . Neuromuscular disorder     neuropathy  . Arthritis     knees bilateral   DJD  stenosis   . Blood dyscrasia     HSV  . Cancer     vaginal  malignant carcinoma  . PCOS (polycystic ovarian syndrome)     reports that she has been smoking Cigarettes.  She has a 10 pack-year smoking history. She has never used smokeless tobacco. She reports that she drinks alcohol. She reports that she does not use illicit drugs. Family History  Problem Relation Age of Onset  . Arthritis Mother   . Clotting disorder Mother    . Hyperlipidemia Father   . Leukemia Maternal Grandmother   . Diabetes Maternal Grandfather   . Stroke Paternal Grandmother   . Heart disease Father   . Diabetes Mother   . Hypertension Mother   . Prostate cancer Father   . Heart disease Paternal Grandmother            Allergies:   Allergies  Allergen Reactions  . Avelox [Moxifloxacin Hcl In Nacl] Hives  . Levofloxacin Hives  . Penicillins Hives  . Quinolones Hives    ACT Assessment Complete:  Yes:    Educational Status    Risk to Self: Risk to self with the past 6 months Is patient at risk for suicide?: No, but patient needs Medical Clearance Substance abuse history and/or treatment for substance abuse?: Yes  Risk to Others:    Abuse:    Prior Inpatient Therapy:    Prior Outpatient Therapy:    Additional Information:           Objective: Blood pressure 120/64, pulse 106, temperature 97.9 F (36.6 C), temperature source Oral, resp. rate 20, last menstrual period 03/12/2014, SpO2 96.00%.There is no weight on file to calculate BMI. Results for orders placed during the hospital encounter of 04/11/14 (from the past 72 hour(s))  CBC     Status: Abnormal   Collection Time    04/11/14  9:15 PM      Result Value Ref Range   WBC 12.7 (*) 4.0 - 10.5 K/uL   RBC 4.42  3.87 - 5.11 MIL/uL   Hemoglobin 14.2  12.0 - 15.0 g/dL   HCT 41.4  36.0 - 46.0 %   MCV 93.7  78.0 - 100.0 fL   MCH 32.1  26.0 - 34.0 pg   MCHC 34.3  30.0 - 36.0 g/dL   RDW 14.4  11.5 - 15.5 %   Platelets 221  150 - 400 K/uL  COMPREHENSIVE METABOLIC PANEL     Status: Abnormal   Collection Time    04/11/14  9:15 PM      Result Value Ref Range   Sodium 140  137 - 147 mEq/L   Potassium 4.1  3.7 - 5.3 mEq/L   Chloride 100  96 - 112 mEq/L   CO2 25  19 - 32 mEq/L   Glucose, Bld 111 (*) 70 - 99 mg/dL   BUN 10  6 - 23 mg/dL   Creatinine, Ser 0.90  0.50 - 1.10 mg/dL   Calcium 9.2  8.4 - 10.5 mg/dL   Total Protein 7.7  6.0 - 8.3 g/dL   Albumin 3.9  3.5 -  5.2 g/dL   AST 22  0 - 37 U/L   ALT 34  0 - 35 U/L   Alkaline Phosphatase 70  39 - 117 U/L   Total Bilirubin 0.3  0.3 - 1.2 mg/dL   GFR calc non Af Amer 80 (*) >90 mL/min   GFR calc Af Amer >90  >90 mL/min   Comment: (NOTE)     The eGFR has been calculated using the CKD EPI equation.     This calculation has not been validated in all clinical situations.     eGFR's persistently <90 mL/min signify possible Chronic Kidney     Disease.   Anion gap 15  5 - 15  ETHANOL     Status: Abnormal   Collection Time    04/11/14  9:15 PM      Result Value Ref Range   Alcohol, Ethyl (B) 211 (*) 0 - 11 mg/dL   Comment:            LOWEST DETECTABLE LIMIT FOR     SERUM ALCOHOL IS 11 mg/dL     FOR MEDICAL PURPOSES ONLY  I-STAT TROPOININ, ED     Status: None   Collection Time    04/11/14  9:25 PM      Result Value Ref Range   Troponin i, poc 0.00  0.00 - 0.08 ng/mL   Comment 3            Comment: Due to the release kinetics of cTnI,     a negative result within the first hours     of the onset of symptoms does not rule out     myocardial infarction with certainty.     If myocardial infarction is still suspected,     repeat the test at appropriate intervals.  URINE RAPID DRUG SCREEN (HOSP PERFORMED)     Status: Abnormal   Collection Time    04/11/14  9:54 PM      Result Value Ref Range   Opiates NONE DETECTED  NONE DETECTED   Cocaine POSITIVE (*) NONE DETECTED   Benzodiazepines NONE DETECTED  NONE DETECTED   Amphetamines NONE DETECTED  NONE DETECTED   Tetrahydrocannabinol NONE DETECTED  NONE DETECTED   Barbiturates NONE DETECTED  NONE DETECTED   Comment:            DRUG SCREEN FOR MEDICAL PURPOSES     ONLY.  IF CONFIRMATION IS NEEDED     FOR ANY PURPOSE, NOTIFY LAB     WITHIN 5 DAYS.                LOWEST DETECTABLE LIMITS     FOR URINE DRUG SCREEN     Drug Class       Cutoff (ng/mL)     Amphetamine      1000     Barbiturate      200     Benzodiazepine   010     Tricyclics       272      Opiates          300     Cocaine          300     THC              50  POC URINE PREG, ED     Status: None   Collection Time    04/11/14  9:58 PM      Result Value Ref Range   Preg Test, Ur NEGATIVE  NEGATIVE   Comment:            THE SENSITIVITY OF THIS     METHODOLOGY IS >24 mIU/mL   Labs are reviewed  see above values.  Medications reviewed; Librium was started for alcohol detox.  Current Facility-Administered Medications  Medication Dose Route Frequency Provider Last Rate Last Dose  . chlordiazePOXIDE (LIBRIUM) capsule 25 mg  25 mg Oral Q6H PRN Kenza Munar, NP      . doxycycline (VIBRA-TABS) tablet 100 mg  100 mg Oral Q12H Nathan R. Pickering, MD   100 mg at 04/12/14 1032  . gabapentin (NEURONTIN) capsule 800 mg  800 mg Oral QID Demitrus Francisco, NP   800 mg at 04/12/14 1032  . hydrOXYzine (ATARAX/VISTARIL) tablet 25 mg  25 mg Oral Q6H PRN Lindi Abram, NP      . [START ON 04/13/2014] levothyroxine (SYNTHROID, LEVOTHROID) tablet 50 mcg  50 mcg Oral QAC breakfast Mili Piltz, NP      . loperamide (IMODIUM) capsule 2-4 mg  2-4 mg Oral PRN Veta Dambrosia, NP      . multivitamin with minerals tablet 1 tablet  1 tablet Oral Daily Rushi Chasen, NP      . nicotine (NICODERM CQ - dosed in mg/24 hours) patch 21 mg  21 mg Transdermal Once [employer]. Pickering, MD   21 mg at 04/12/14 0029  . ondansetron (ZOFRAN-ODT) disintegrating tablet 4 mg  4 mg Oral Q6H PRN Emmaleah Meroney, NP   4 mg at 04/12/14 1032  . PARoxetine (PAXIL) tablet 40 mg  40 mg Oral Daily Anvi Mangal, NP      . thiamine (B-1) injection 100 mg  100 mg Intravenous Daily Pura Spice, PA-C      . thiamine (VITAMIN B-1) tablet 100 mg  100 mg Oral Daily Pura Spice, PA-C   100 mg at 04/12/14 1031  . zolpidem (AMBIEN) tablet 10 mg  10 mg Oral QHS PRN Azaria Stegman, NP       Current Outpatient Prescriptions  Medication Sig Dispense Refill  . gabapentin (NEURONTIN) 400 MG capsule Take 2 capsules (800 mg total) by  mouth 4 (four) times daily.  240 capsule  0  . ibuprofen (ADVIL,MOTRIN) 800 MG tablet Take 1 tablet (800 mg total) by mouth every 8 (eight) hours as needed for moderate pain. Take with food.  30 tablet  0  . levothyroxine (SYNTHROID, LEVOTHROID) 50 MCG tablet Take 1 tablet (50 mcg total) by mouth daily before breakfast.  30 tablet  1  . LORazepam (ATIVAN) 2 MG tablet Take 2 mg by mouth every 6 (six) hours as needed for anxiety.      Marland Kitchen PARoxetine (PAXIL) 40 MG tablet Take 1 tablet (40 mg total) by mouth daily. Due for follow up w/Dr Laney Pastor  30 tablet  0  . zolpidem (AMBIEN) 10 MG tablet Take 10 mg by mouth at bedtime as needed for sleep.        Psychiatric Specialty Exam:     Blood pressure 120/64, pulse 106, temperature 97.9 F (36.6 C), temperature source Oral, resp. rate 20, last menstrual period 03/12/2014, SpO2 96.00%.There is no weight on file to calculate BMI.  General Appearance: Casual  Eye Contact::  Good  Speech:  Clear and Coherent and Normal Rate  Volume:  Normal  Mood:  Anxious  Affect:  Congruent  Thought Process:  Circumstantial  Orientation:  Full (Time, Place, and Person)  Thought Content:  Rumination  Suicidal Thoughts:  No  Homicidal Thoughts:  No  Memory:  Immediate;   Good Recent;   Good Remote;   Good  Judgement:  Fair  Insight:  Shallow  Psychomotor Activity:  Normal  Concentration:  Fair  Recall:  Good  Fund of Knowledge:Good  Language: Good  Akathisia:  No  Handed:  Right  AIMS (if indicated):     Assets:  Communication Skills Housing Social Support  Sleep:      Musculoskeletal: Strength & Muscle Tone: within normal limits Gait & Station: normal Patient leans: N/A  Consulted with TTS to set up a appointment for [facility].  Reports that patient refused services and stated that she could handle on her own.  Treatment Plan Summary: Discharge home with substance abuse resource information rehab and outpatient services.   Debbi Strandberg,  FNP-BC 04/12/2014 10:57 AM

## 2014-04-12 NOTE — Consult Note (Signed)
  Doxycycline 100 mg Q 12 hours for 14 days first dose was 04/11/14 at 2245.  This prescription was called in to [facility] on [address] [place] by Dr. Lovena Le

## 2014-04-12 NOTE — ED Notes (Signed)
Patient discharged to home.  States she feels she should stay in the hospital one more night.  Discussed with Dr. Lovena Le who states she needs to go home and if she does not want to leave security can escort her.  She was give resources for follow up care. Vital signs are within defined limits.

## 2014-04-12 NOTE — Progress Notes (Signed)
  CARE MANAGEMENT ED NOTE 04/12/2014  Patient:  [facility]   Account Number:  [id number]  Date Initiated:  04/12/2014  Documentation initiated by:  Jackelyn Poling  Subjective/Objective Assessment:   38 yr old blue medicare hmo advantage Estelle pt cp which yesterday, states worse today.  Pt describes cp as pressure pain.  Pt states she does not know if it's from anxiety.  Pt does have anxiety.   also requesting detox from [facility]     Subjective/Objective Assessment Detail:   pcp Leandrew Koyanagi  Informs CM she is aware of how to and "has done so before) call [employer] customer service or go to [employer] website to obtain her in network list of mental health substance provider  While CM was speaking with her she began to shake her hands and state she was "too anxious", " want to stay overnight" Pt was not noted to be shaking when CM entered the room CM informed pt CM did not make discharge decision but would share her concern with her [facility] MD/NP     Action/Plan:   From ED BH progression BH MD/NP suggested providing pt with [employer] in network providers for detox.   Action/Plan Detail:   Pt offered the list of in network facilities listed from [employer] (not specific -site only offered list of hospitals) 1220 spoke with mary at [phone] to obtain a list of in network substance abuse/detox facilities (which are the same)   Anticipated DC Date:       Status Recommendation to Physician:   Result of Recommendation:    Other ED Services  Consult Working [place]  Other  Outpatient Services - Pt will follow up    Choice offered to / List presented to:            Status of service:  Completed, signed off  ED Comments:   ED Comments Detail:  04/12/14 when Cm went in to provide list to pt CM awaken her from snoring but when CM provided resources pt began shaking her hands again and asked if she would be staying, CM referred to [facility] MD/NP. TTS/SW updated

## 2014-04-12 NOTE — ED Notes (Signed)
Bed: [facility] Expected date:  Expected time:  Means of arrival:  Comments: Hold for [place], M.

## 2014-04-12 NOTE — BHH Counselor (Signed)
Writer met with patient to discuss linking pt with [facility] outpatient resources. Pt states several times that she can't leave d/t her severe anxiety. Writer explained that pt has been psychiatrically cleared and will be d/c today. Pt again says that she can't leave the hospital. Writer made Dr Lovena Le, Earleen Newport NP and pt's RN [facility] aware.  Arnold Long, [place] Assessment Counselor

## 2014-04-12 NOTE — BHH Suicide Risk Assessment (Cosign Needed)
Suicide Risk Assessment  Discharge Assessment     Demographic Factors:  Caucasian and Female  Total Time spent with patient: 30 minutes  Psychiatric Specialty Exam:     Blood pressure 120/64, pulse 106, temperature 97.9 F (36.6 C), temperature source Oral, resp. rate 20, last menstrual period 03/12/2014, SpO2 96.00%.There is no weight on file to calculate BMI.   General Appearance: Casual   Eye Contact:: Good   Speech: Clear and Coherent and Normal Rate   Volume: Normal   Mood: Anxious   Affect: Congruent   Thought Process: Circumstantial   Orientation: Full (Time, Place, and Person)   Thought Content: Rumination   Suicidal Thoughts: No   Homicidal Thoughts: No   Memory: Immediate; Good  Recent; Good  Remote; Good   Judgement: Fair   Insight: Shallow   Psychomotor Activity: Normal   Concentration: Fair   Recall: Good   Fund of Knowledge:Good   Language: Good   Akathisia: No   Handed: Right   AIMS (if indicated):   Assets: Communication Skills  Housing  Social Support   Sleep:   Musculoskeletal:  Strength & Muscle Tone: within normal limits  Gait & Station: normal  Patient leans: N/A   Mental Status Per Nursing Assessment::   On Admission:     Current Mental Status by Physician: Patient denies suicidal/homicidal ideation, psychosis, and paranoia  Loss Factors: NA  Historical Factors: NA  Risk Reduction Factors:   Sense of responsibility to family and Positive social support  Continued Clinical Symptoms:  Alcohol/Substance Abuse/Dependencies  Cognitive Features That Contribute To Risk:  None noted    Suicide Risk:  Minimal: No identifiable suicidal ideation.  Patients presenting with no risk factors but with morbid ruminations; may be classified as minimal risk based on the severity of the depressive symptoms  Discharge Diagnoses:  AXIS I: Alcohol Abuse, Anxiety Disorder NOS, Substance Abuse and Substance Induced Mood Disorder  AXIS II:  Deferred  AXIS III:  Past Medical History   Diagnosis  Date   .  Narcotic abuse in remission    .  Thyroid disease    .  Chronic back pain    .  Hypothyroidism    .  Anxiety    .  GERD (gastroesophageal reflux disease)    .  Sleep apnea      cpap   .  Headache(784.0)    .  Neuromuscular disorder      neuropathy   .  Arthritis      knees bilateral DJD stenosis   .  Blood dyscrasia      HSV   .  Cancer      vaginal malignant carcinoma   .  PCOS (polycystic ovarian syndrome)     AXIS IV: other psychosocial or environmental problems  AXIS V: 61-70 mild symptoms   Plan Of Care/Follow-up recommendations:  Activity:  as tolerated Diet:  as tolerated  Is patient on multiple antipsychotic therapies at discharge:  No   Has Patient had three or more failed trials of antipsychotic monotherapy by history:  No  Recommended Plan for Multiple Antipsychotic Therapies: NA    Aramis Weil, FNP-BC 04/12/2014, 11:20 AM

## 2014-04-12 NOTE — Consult Note (Signed)
Face to face evaluation and I agree with this note 

## 2014-04-12 NOTE — ED Notes (Signed)
Pt medicated with additional Ativan per [facility]WA protocol. Nicotine patch 21mg . Pt ambulatory to BR with no assistance.

## 2014-04-12 NOTE — ED Notes (Signed)
Dr. Dorina Hoyer contacted and informed that patient complains of diarrhea with multiple episodes. New orders received and verified. Will continue to monitor patient.

## 2014-04-12 NOTE — Discharge Instructions (Signed)
Alcohol Intoxication Alcohol intoxication occurs when you drink enough alcohol that it affects your ability to function. It can be mild or very severe. Drinking a lot of alcohol in a short time is called binge drinking. This can be very harmful. Drinking alcohol can also be more dangerous if you are taking medicines or other drugs. Some of the effects caused by alcohol may include:  Loss of coordination.  Changes in mood and behavior.  Unclear thinking.  Trouble talking (slurred speech).  Throwing up (vomiting).  Confusion.  Slowed breathing.  Twitching and shaking (seizures).  Loss of consciousness. HOME CARE  Do not drive after drinking alcohol.  Drink enough water and fluids to keep your pee (urine) clear or pale yellow. Avoid caffeine.  Only take medicine as told by your doctor. GET HELP IF:  You throw up (vomit) many times.  You do not feel better after a few days.  You frequently have alcohol intoxication. Your doctor can help decide if you should see a substance use treatment counselor. GET HELP RIGHT AWAY IF:  You become shaky when you stop drinking.  You have twitching and shaking.  You throw up blood. It may look bright red or like coffee grounds.  You notice blood in your poop (bowel movements).  You become lightheaded or pass out (faint). MAKE SURE YOU:   Understand these instructions.  Will watch your condition.  Will get help right away if you are not doing well or get worse. Document Released: 12/30/2007 Document Revised: 03/15/2013 Document Reviewed: 12/16/2012 [facility] Patient Information 2015 [place], [place]. This information is not intended to replace advice given to you by your health care provider. Make sure you discuss any questions you have with your health care provider.  Alcohol Use Disorder Alcohol use disorder is a mental disorder. It is not a one-time incident of heavy drinking. Alcohol use disorder is the excessive and  uncontrollable use of alcohol over time that leads to problems with functioning in one or more areas of daily living. People with this disorder risk harming themselves and others when they drink to excess. Alcohol use disorder also can cause other mental disorders, such as mood and anxiety disorders, and serious physical problems. People with alcohol use disorder often misuse other drugs.  Alcohol use disorder is common and widespread. Some people with this disorder drink alcohol to cope with or escape from negative life events. Others drink to relieve chronic pain or symptoms of mental illness. People with a family history of alcohol use disorder are at higher risk of losing control and using alcohol to excess.  SYMPTOMS  Signs and symptoms of alcohol use disorder may include the following:   Consumption ofalcohol inlarger amounts or over a longer period of time than intended.  Multiple unsuccessful attempts to cutdown or control alcohol use.   A great deal of time spent obtaining alcohol, using alcohol, or recovering from the effects of alcohol (hangover).  A strong desire or urge to use alcohol (cravings).   Continued use of alcohol despite problems at work, school, or home because of alcohol use.   Continued use of alcohol despite problems in relationships because of alcohol use.  Continued use of alcohol in situations when it is physically hazardous, such as driving a car.  Continued use of alcohol despite awareness of a physical or psychological problem that is likely related to alcohol use. Physical problems related to alcohol use can involve the brain, heart, liver, stomach, and intestines. Psychological problems related  to alcohol use include intoxication, depression, anxiety, psychosis, delirium, and dementia.   The need for increased amounts of alcohol to achieve the same desired effect, or a decreased effect from the consumption of the same amount of alcohol  (tolerance).  Withdrawal symptoms upon reducing or stopping alcohol use, or alcohol use to reduce or avoid withdrawal symptoms. Withdrawal symptoms include:  Racing heart.  Hand tremor.  Difficulty sleeping.  Nausea.  Vomiting.  Hallucinations.  Restlessness.  Seizures. DIAGNOSIS Alcohol use disorder is diagnosed through an assessment by your health care provider. Your health care provider may start by asking three or four questions to screen for excessive or problematic alcohol use. To confirm a diagnosis of alcohol use disorder, at least two symptoms must be present within a 93-month period. The severity of alcohol use disorder depends on the number of symptoms:  Mild--two or three.  Moderate--four or five.  Severe--six or more. Your health care provider may perform a physical exam or use results from lab tests to see if you have physical problems resulting from alcohol use. Your health care provider may refer you to a mental health professional for evaluation. TREATMENT  Some people with alcohol use disorder are able to reduce their alcohol use to low-risk levels. Some people with alcohol use disorder need to quit drinking alcohol. When necessary, mental health professionals with specialized training in substance use treatment can help. Your health care provider can help you decide how severe your alcohol use disorder is and what type of treatment you need. The following forms of treatment are available:   Detoxification. Detoxification involves the use of prescription medicines to prevent alcohol withdrawal symptoms in the first week after quitting. This is important for people with a history of symptoms of withdrawal and for heavy drinkers who are likely to have withdrawal symptoms. Alcohol withdrawal can be dangerous and, in severe cases, cause death. Detoxification is usually provided in a hospital or in-patient substance use treatment facility.  Counseling or talk therapy.  Talk therapy is provided by substance use treatment counselors. It addresses the reasons people use alcohol and ways to keep them from drinking again. The goals of talk therapy are to help people with alcohol use disorder find healthy activities and ways to cope with life stress, to identify and avoid triggers for alcohol use, and to handle cravings, which can cause relapse.  Medicines.Different medicines can help treat alcohol use disorder through the following actions:  Decrease alcohol cravings.  Decrease the positive reward response felt from alcohol use.  Produce an uncomfortable physical reaction when alcohol is used (aversion therapy).  Support groups. Support groups are run by people who have quit drinking. They provide emotional support, advice, and guidance. These forms of treatment are often combined. Some people with alcohol use disorder benefit from intensive combination treatment provided by specialized substance use treatment centers. Both inpatient and outpatient treatment programs are available. Document Released: 08/20/2004 Document Revised: 11/27/2013 Document Reviewed: 10/20/2012 [facility] Patient Information 2015 [place], [place]. This information is not intended to replace advice given to you by your health care provider. Make sure you discuss any questions you have with your health care provider.  Finding Treatment for Alcohol and Drug Addiction It can be hard to find the right place to get professional treatment. Here are some important things to consider:  There are different types of treatment to choose from.  Some programs are live-in (residential) while others are not (outpatient). Sometimes a combination is offered.  No single  type of program is right for everyone.  Most treatment programs involve a combination of education, counseling, and a 12-step, spiritually-based approach.  There are non-spiritually based programs (not 12-step).  Some treatment programs  are government sponsored. They are geared for patients without private insurance.  Treatment programs can vary in many respects such as:  Cost and types of insurance accepted.  Types of on-site medical services offered.  Length of stay, setting, and size.  Overall philosophy of treatment. A person may need specialized treatment or have needs not addressed by all programs. For example, adolescents need treatment appropriate for their age. Other people have secondary disorders that must be managed as well. Secondary conditions can include mental illness, such as depression or diabetes. Often, a period of detoxification from alcohol or drugs is needed. This requires medical supervision and not all programs offer this. THINGS TO CONSIDER WHEN SELECTING A TREATMENT PROGRAM   Is the program certified by the appropriate government agency? Even private programs must be certified and employ certified professionals.  Does the program accept your insurance? If not, can a payment plan be set up?  Is the facility clean, organized, and well run? Do they allow you to speak with graduates who can share their treatment experience with you? Can you tour the facility? Can you meet with staff?  Does the program meet the full range of individual needs?  Does the treatment program address sexual orientation and physical disabilities? Do they provide age, gender, and culturally appropriate treatment services?  Is treatment available in languages other than English?  Is long-term aftercare support or guidance encouraged and provided?  Is assessment of an individual's treatment plan ongoing to ensure it meets changing needs?  Does the program use strategies to encourage reluctant patients to remain in treatment long enough to increase the likelihood of success?  Does the program offer counseling (individual or group) and other behavioral therapies?  Does the program offer medicine as part of the treatment  regimen, if needed?  Is there ongoing monitoring of possible relapse? Is there a defined relapse prevention program? Are services or referrals offered to family members to ensure they understand addiction and the recovery process? This would help them support the recovering individual.  Are 12-step meetings held at the center or is transport available for patients to attend outside meetings? In countries outside of the [place] and [place], [employer] for contact information for services in your area. Document Released: 06/11/2005 Document Revised: 10/05/2011 Document Reviewed: 12/22/2007 [facility] Patient Information 2015 [place], [place]. This information is not intended to replace advice given to you by your health care provider. Make sure you discuss any questions you have with your health care provider.  Cocaine Cocaine stimulates the central nervous system. As a stimulant, cocaine has the ability to improve athletic performance through increasing speed, endurance, and concentration, as well as decreasing fatigue. Although cocaine may seem to be beneficial for athletics, it is highly addicting and has many debilitating side effects. Cocaine has caused the deaths of many athletes, and its use is banned by every major athletic organization in the world. The clinical effect of cocaine (the high) is very short in duration. Cocaine works in the brain by altering the normal concentrations of chemicals that stimulate the brain cells.  WHY ATHLETES USE IT  Many athletes use cocaine for its central nervous system stimulating properties. It is also used as a recreational drug due to the euphoric felling it produces.  ADVERSE EFFECTS  Sleep disturbances.  Abnormal heart rhythms.  Stroke.  Heart attack.  Seizures.  Elevated blood pressure.  Death.  Paranoia (feeling that people want to hurt you).  Panic attacks (sudden feelings of anxiety or shortness of breath).  Suicidal behavior  (wanting to kill yourself).  Homicidal behavior (wanting to kill other people).  Depression (feeling very sad, having decreased energy for activities).  Poor athletic performance. PHARMACOLOGY  Cocaine acts on the body for a short period of time; the clinical effects may last less than1 hour. Since most athletic competitions last for more than 1 hour, cocaine use may not improve athletic performance. The use of cocaine makes individuals much more susceptible for serious conditions such as seizures, arrhythmia (irregular heart beat), and strokes. Even a single dose of cocaine can be detected on a drug test for up to about 30 hours.  PREVENTION Most athletes use cocaine as a recreational drug and not for the purpose of enhancing athletic performance. To prevent the use of cocaine, athletes must be educated on its side effects and the risk of addiction. If an athlete is found using cocaine, counseling and treatment are almost always required.  Document Released: 07/13/2005 Document Revised: 10/05/2011 Document Reviewed: 10/25/2008 [facility] Patient Information 2015 [place], [place]. This information is not intended to replace advice given to you by your health care provider. Make sure you discuss any questions you have with your health care provider.

## 2014-04-13 NOTE — ED Provider Notes (Signed)
Medical screening examination/treatment/procedure(s) were performed by non-physician practitioner and as supervising physician I was immediately available for consultation/collaboration.   EKG Interpretation   Date/Time:  Wednesday April 11 2014 20:30:45 EDT Ventricular Rate:  97 PR Interval:  125 QRS Duration: 89 QT Interval:  346 QTC Calculation: 439 R Axis:   43 Text Interpretation:  Sinus rhythm No significant change since last  tracing Confirmed by KNAPP  MD-J, JON ([id number]) on 04/11/2014 8:36:59 PM       [name]. Alvino Chapel, MD 04/13/14 [id number]

## 2014-04-16 ENCOUNTER — Encounter (HOSPITAL_COMMUNITY): Payer: Self-pay | Admitting: Emergency Medicine

## 2014-04-16 ENCOUNTER — Emergency Department (HOSPITAL_COMMUNITY)
Admission: EM | Admit: 2014-04-16 | Discharge: 2014-04-17 | Disposition: A | Discharge status: Home / Self Care | Payer: Medicare Other | Attending: Emergency Medicine | Admitting: Emergency Medicine

## 2014-04-16 DIAGNOSIS — F17200 Nicotine dependence, unspecified, uncomplicated: Secondary | ICD-10-CM | POA: Insufficient documentation

## 2014-04-16 DIAGNOSIS — G4730 Sleep apnea, unspecified: Secondary | ICD-10-CM | POA: Diagnosis not present

## 2014-04-16 DIAGNOSIS — R52 Pain, unspecified: Secondary | ICD-10-CM | POA: Diagnosis not present

## 2014-04-16 DIAGNOSIS — F411 Generalized anxiety disorder: Secondary | ICD-10-CM | POA: Insufficient documentation

## 2014-04-16 DIAGNOSIS — Z880 Allergy status to penicillin: Secondary | ICD-10-CM | POA: Insufficient documentation

## 2014-04-16 DIAGNOSIS — M5442 Lumbago with sciatica, left side: Secondary | ICD-10-CM

## 2014-04-16 DIAGNOSIS — X58XXXA Exposure to other specified factors, initial encounter: Secondary | ICD-10-CM | POA: Diagnosis not present

## 2014-04-16 DIAGNOSIS — G589 Mononeuropathy, unspecified: Secondary | ICD-10-CM | POA: Diagnosis not present

## 2014-04-16 DIAGNOSIS — Z8719 Personal history of other diseases of the digestive system: Secondary | ICD-10-CM | POA: Insufficient documentation

## 2014-04-16 DIAGNOSIS — Z79899 Other long term (current) drug therapy: Secondary | ICD-10-CM | POA: Insufficient documentation

## 2014-04-16 DIAGNOSIS — Y9389 Activity, other specified: Secondary | ICD-10-CM | POA: Diagnosis not present

## 2014-04-16 DIAGNOSIS — Z862 Personal history of diseases of the blood and blood-forming organs and certain disorders involving the immune mechanism: Secondary | ICD-10-CM | POA: Insufficient documentation

## 2014-04-16 DIAGNOSIS — G8929 Other chronic pain: Secondary | ICD-10-CM | POA: Insufficient documentation

## 2014-04-16 DIAGNOSIS — R51 Headache: Secondary | ICD-10-CM | POA: Insufficient documentation

## 2014-04-16 DIAGNOSIS — Z8739 Personal history of other diseases of the musculoskeletal system and connective tissue: Secondary | ICD-10-CM | POA: Insufficient documentation

## 2014-04-16 DIAGNOSIS — Z859 Personal history of malignant neoplasm, unspecified: Secondary | ICD-10-CM | POA: Diagnosis not present

## 2014-04-16 DIAGNOSIS — Y9289 Other specified places as the place of occurrence of the external cause: Secondary | ICD-10-CM | POA: Diagnosis not present

## 2014-04-16 DIAGNOSIS — E039 Hypothyroidism, unspecified: Secondary | ICD-10-CM | POA: Diagnosis not present

## 2014-04-16 DIAGNOSIS — IMO0002 Reserved for concepts with insufficient information to code with codable children: Secondary | ICD-10-CM | POA: Insufficient documentation

## 2014-04-16 DIAGNOSIS — Z802 Family history of malignant neoplasm of other respiratory and intrathoracic organs: Secondary | ICD-10-CM | POA: Insufficient documentation

## 2014-04-16 DIAGNOSIS — E669 Obesity, unspecified: Secondary | ICD-10-CM | POA: Insufficient documentation

## 2014-04-16 DIAGNOSIS — R519 Headache, unspecified: Secondary | ICD-10-CM

## 2014-04-16 LAB — STOOL CULTURE: Special Requests: NORMAL

## 2014-04-16 MED ORDER — IBUPROFEN 200 MG PO TABS
400.0000 mg | ORAL_TABLET | Freq: Once | ORAL | Status: AC
  Administered 2014-04-16: 400 mg via ORAL
  Filled 2014-04-16: qty 2

## 2014-04-16 NOTE — ED Notes (Signed)
Pt to ED via [facility], pt presents with headache since noon- admits to nausea and vomiting.  Pt reports blurred vision to right eye.  Pt alert and oriented X 4, neuro exam negative.

## 2014-04-17 LAB — BASIC METABOLIC PANEL
Anion gap: 16 — ABNORMAL HIGH (ref 5–15)
BUN: 11 mg/dL (ref 6–23)
CHLORIDE: 97 meq/L (ref 96–112)
CO2: 24 mEq/L (ref 19–32)
Calcium: 8.6 mg/dL (ref 8.4–10.5)
Creatinine, Ser: 1.03 mg/dL (ref 0.50–1.10)
GFR calc Af Amer: 78 mL/min — ABNORMAL LOW (ref >90)
GFR calc non Af Amer: 68 mL/min — ABNORMAL LOW (ref >90)
Glucose, Bld: 99 mg/dL (ref 70–99)
Potassium: 3.6 mEq/L — ABNORMAL LOW (ref 3.7–5.3)
Sodium: 137 mEq/L (ref 137–147)

## 2014-04-17 MED ORDER — KETOROLAC TROMETHAMINE 30 MG/ML IJ SOLN
30.0000 mg | Freq: Once | INTRAMUSCULAR | Status: AC
  Administered 2014-04-17: 30 mg via INTRAVENOUS
  Filled 2014-04-17: qty 1

## 2014-04-17 MED ORDER — MORPHINE SULFATE 4 MG/ML IJ SOLN
4.0000 mg | Freq: Once | INTRAMUSCULAR | Status: AC
  Administered 2014-04-17: 4 mg via INTRAVENOUS
  Filled 2014-04-17: qty 1

## 2014-04-17 MED ORDER — CYCLOBENZAPRINE HCL 10 MG PO TABS: 10.0000 mg | ORAL_TABLET | Freq: Two times a day (BID) | ORAL | Status: DC | PRN

## 2014-04-17 MED ORDER — HYDROCODONE-ACETAMINOPHEN 5-325 MG PO TABS
2.0000 | ORAL_TABLET | Freq: Once | ORAL | Status: AC
  Administered 2014-04-17: 2 via ORAL
  Filled 2014-04-17: qty 2

## 2014-04-17 MED ORDER — HYDROCODONE-ACETAMINOPHEN 5-325 MG PO TABS: 1.0000 | ORAL_TABLET | Freq: Two times a day (BID) | ORAL | Status: DC | PRN

## 2014-04-17 MED ORDER — SODIUM CHLORIDE 0.9 % IV BOLUS (SEPSIS)
1000.0000 mL | Freq: Once | INTRAVENOUS | Status: AC
  Administered 2014-04-17: 1000 mL via INTRAVENOUS

## 2014-04-17 NOTE — ED Notes (Signed)
rn starting iv

## 2014-04-17 NOTE — Consult Note (Signed)
Face to face evaluation and I agree with this note 

## 2014-04-17 NOTE — Discharge Instructions (Signed)
[name], [name] [name], you were seen for headache and [name].  Your headache went away in the waiting room and you refused treatment for it.  Your [name] needs imaging evaluation, but you refused that in the Emergency Department.  Please follow up with your doctor within 3 days for evaluation and imaging of your [name].  Take [name] medications as prescribed and return to the ED for any worsening.  Thank you. [name] is very common. The [name] often gets better over time. The cause of [name] is usually not dangerous. Most people can learn to manage their [name] on their own.  HOME CARE   Stay active. Start with short walks on flat ground if you can. Try to walk farther each [place].  Do not sit, drive, or stand in one place for more than 30 minutes. Do not stay in bed.  Do not avoid exercise or work. Activity can help your [name] heal faster.  Be careful when you bend or lift an object. Bend at your knees, keep the object close to you, and do not twist.  Sleep on a firm mattress. Lie on your side, and bend your knees. If you lie on your [name], put a pillow under your knees.  Only take medicines as told by your doctor.  Put ice on the injured area.  Put ice in a plastic bag.  Place a towel between your skin and the bag.  Leave the ice on for 15-20 minutes, 03-04 times a [place] for the first 2 to 3 days. After that, you can switch between ice and heat packs.  Ask your doctor about [name] exercises or massage.  Avoid feeling anxious or stressed. Find good ways to deal with stress, such as exercise. GET HELP RIGHT AWAY IF:   Your [name] does not go away with rest or medicine.  Your [name] does not go away in 1 week.  You have new problems.  You do not feel well.  The [name] spreads into your legs.  You cannot control when you poop (bowel movement) or pee (urinate).  Your arms or legs feel weak or lose feeling (numbness).  You feel sick to your stomach (nauseous) or throw up  (vomit).  You have belly (abdominal) [name].  You feel like you may pass out (faint). MAKE SURE YOU:   Understand these instructions.  Will watch your condition.  Will get help right away if you are not doing well or get worse. Document Released: 12/30/2007 Document Revised: 10/05/2011 Document Reviewed: 11/14/2013 [facility] Patient Information 2015 [place], [place]. This information is not intended to replace advice given to you by your health care provider. Make sure you discuss any questions you have with your health care provider. General Headache Without Cause A headache is [name] or discomfort felt around the head or neck area. The specific cause of a headache may not be found. There are many causes and types of headaches. A few common ones are:  Tension headaches.  Migraine headaches.  Cluster headaches.  Chronic daily headaches. HOME CARE INSTRUCTIONS   Keep all follow-up appointments with your caregiver or any specialist referral.  Only take over-the-counter or prescription medicines for [name] or discomfort as directed by your caregiver.  Lie down in a dark, quiet room when you have a headache.  Keep a headache journal to find out what may trigger your migraine headaches. For example, write down:  What you eat and drink.  How much sleep you get.  Any change to your diet or medicines.  Try massage or other relaxation techniques.  Put ice packs or heat on the head and neck. Use these 3 to 4 times per [place] for 15 to 20 minutes each time, or as needed.  Limit stress.  Sit up straight, and do not tense your muscles.  Quit smoking if you smoke.  Limit alcohol use.  Decrease the amount of caffeine you drink, or stop drinking caffeine.  Eat and sleep on a regular schedule.  Get 7 to 9 hours of sleep, or as recommended by your caregiver.  Keep lights dim if bright lights bother you and make your headaches worse. SEEK MEDICAL CARE IF:   You have problems with the  medicines you were prescribed.  Your medicines are not working.  You have a change from the usual headache.  You have nausea or vomiting. SEEK IMMEDIATE MEDICAL CARE IF:   Your headache becomes severe.  You have a fever.  You have a stiff neck.  You have loss of vision.  You have muscular weakness or loss of muscle control.  You start losing your balance or have trouble walking.  You feel faint or pass out.  You have severe symptoms that are different from your first symptoms. MAKE SURE YOU:   Understand these instructions.  Will watch your condition.  Will get help right away if you are not doing well or get worse. Document Released: 07/13/2005 Document Revised: 10/05/2011 Document Reviewed: 07/29/2011 [facility] Patient Information 2015 [place], [place]. This information is not intended to replace advice given to you by your health care provider. Make sure you discuss any questions you have with your health care provider.

## 2014-04-17 NOTE — ED Notes (Addendum)
Was initially here for migraine HA. Was severe, but has now lessened. Today around 1500 while pt was vomiting do to HA, pt felt pop in lower back and relates that to a disc popping, h/o back issues and multiple (5) surgeries (Dr. Tonita Cong & Patrice Paradise, Ortho), has been doing well with low back, but vomiting did something to it today. L foot usually weaker. C/o L lower back, hip, leg and calf pain. Rates HA 2-3/10. Back and hip/leg pain 8/10. Pt getting into gown. Dr. Claudine Mouton in to see pt.  States, "have not had opiates in ~ 8-10 years" and "feel dehydrated".

## 2014-04-17 NOTE — ED Provider Notes (Signed)
CSN: [id number]     Arrival date & time 04/16/14  1944 History   First MD Initiated Contact with Patient 04/17/14 0001     Chief Complaint  Patient presents with  . Headache     (Consider location/radiation/quality/duration/timing/severity/associated sxs/prior Treatment) HPI [name] is a 38 y.o. female with a past medical history of narcotic abuse, back pain with surgery, presents emergency Department with headache and back pain. Patient states the headache began 9:30 this morning while eating breakfast. It is posterior and was associated with some blurry vision on the right. She states her last eye was not blurry. She had associated emesis as well. She does have history of headaches in the past it has been many years. Patient denied any other neurologic symptoms such as muscle weakness numbness or tingling. Patient was given a Motrin in triage and states that her headache went away spontaneously in the waiting room. She then was vomiting in the bathroom, and had severe low back pain. She describes it as sharp in the middle, and radiates to her left hip although it on her left calf. She states she felt a pop, which is consistent with her Lasix back surgeries. She believes that she may have herniated another disc. Since denying any fevers recent infections, stiff neck, or history of IV drug abuse.  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  She denies any incontinence or saddle anesthesia, she has no lower stability numbness or tingling. Patient has no further complaints.  10 Systems reviewed and are negative for acute change except as noted in the HPI.    Past Medical History  Diagnosis Date  . Narcotic abuse in remission   . Thyroid disease   . Chronic back pain   . Hypothyroidism   . Anxiety   . GERD (gastroesophageal reflux disease)   . Sleep apnea     cpap  . Headache(784.0)   . Neuromuscular disorder     neuropathy  . Arthritis     knees bilateral   DJD  stenosis   . Blood  dyscrasia     HSV  . Cancer     vaginal  malignant carcinoma  . PCOS (polycystic ovarian syndrome)    Past Surgical History  Procedure Laterality Date  . Back surgery    . Cesarean section    . Tonsillectomy    . Tubal ligation    . Fracture surgery     Family History  Problem Relation Age of Onset  . Arthritis Mother   . Clotting disorder Mother   . Hyperlipidemia Father   . Leukemia Maternal Grandmother   . Diabetes Maternal Grandfather   . Stroke Paternal Grandmother   . Heart disease Father   . Diabetes Mother   . Hypertension Mother   . Prostate cancer Father   . Heart disease Paternal Grandmother    History  Substance Use Topics  . Smoking status: Current Every [place] Smoker -- 0.50 packs/[place] for 20 years    Types: Cigarettes  . Smokeless tobacco: Never Used  . Alcohol Use: Yes     Comment: heavy 1/2 gallon vodka daily   OB History   Grav Para Term Preterm Abortions TAB SAB Ect Mult Living                 Review of Systems    Allergies  Avelox; Levofloxacin; Penicillins; and Quinolones  Home Medications   Prior to Admission medications   Medication Sig Start Date End Date Taking?  Authorizing Provider  gabapentin (NEURONTIN) 400 MG capsule Take 2 capsules (800 mg total) by mouth 4 (four) times daily. 04/05/14  Yes Benjamine Mola, FNP  levothyroxine (SYNTHROID, LEVOTHROID) 50 MCG tablet Take 1 tablet (50 mcg total) by mouth daily before breakfast. 04/05/14  Yes Benjamine Mola, FNP  PARoxetine (PAXIL) 40 MG tablet Take 1 tablet (40 mg total) by mouth daily. Due for follow up w/Dr Laney Pastor 04/05/14  Yes Benjamine Mola, FNP   BP 123/60  Pulse 95  Temp(Src) 99.2 F (37.3 C) (Oral)  Resp 18  Ht 5\' 6"  (1.676 m)  Wt 320 lb (145.151 kg)  BMI 51.67 kg/m2  SpO2 96%  LMP 03/12/2014 Physical Exam  Nursing note and vitals reviewed. Constitutional: She is oriented to person, place, and time. She appears well-developed and well-nourished. No distress.  Obese  female  HENT:  Head: Normocephalic and atraumatic.  Nose: Nose normal.  Mouth/Throat: Oropharynx is clear and moist. No oropharyngeal exudate.  Eyes: Conjunctivae and EOM are normal. Pupils are equal, round, and reactive to light. No scleral icterus.  Neck: Normal range of motion. Neck supple. No JVD present. No tracheal deviation present. No thyromegaly present.  Cardiovascular: Normal rate, regular rhythm and normal heart sounds.  Exam reveals no gallop and no friction rub.   No murmur heard. Pulmonary/Chest: Effort normal and breath sounds normal. No respiratory distress. She has no wheezes. She exhibits no tenderness.  Abdominal: Soft. Bowel sounds are normal. She exhibits no distension and no mass. There is no tenderness. There is no rebound and no guarding.  Musculoskeletal: Normal range of motion. She exhibits no edema and no tenderness.  Tenderness to palpation over the lumbar spine.  Lymphadenopathy:    She has no cervical adenopathy.  Neurological: She is alert and oriented to person, place, and time. No cranial nerve deficit. She exhibits normal muscle tone. Coordination normal.  Normal strength and sensation x4 extremities. Normal cerebellar testing. Normal anal tone with normal sensation.  Skin: Skin is warm. No rash noted. She is not diaphoretic. No erythema. No pallor.    ED Course  Procedures (including critical care time) Labs Review Labs Reviewed  BASIC METABOLIC PANEL - Abnormal; Notable for the following:    Potassium 3.6 (*)    GFR calc non Af Amer 68 (*)    GFR calc Af Amer 78 (*)    Anion gap 16 (*)    All other components within normal limits    Imaging Review No results found.   EKG Interpretation None      MDM   Final diagnoses:  None    Patient presents emergency primary concern for a headache and low back pain. She states that her headache and spontaneously resolved and is no longer requesting emergency services for this. Patient is refusing  any treatment or diagnostic her headache. Patient states she only wants pain medication for her back as well as muscle relaxants. She does have a distant history of cancer, thus imaging is warranted. Patient is refusing imaging at this time. She was given IV fluids morphine and Toradol the emergency department. She was transitioned to [facility]. Patient be discharged with a prescription for relief at home. She states that her back doctor will do an MRI during her followup appointment is refusing imaging currently. Her vital signs remain within her normal limits and she is safe for discharge.    Everlene Balls, MD 04/17/14 [phone]

## 2014-04-17 NOTE — ED Notes (Signed)
Pt amiable, interactive, resps e/u, speaking in clear complete sentences, alert, NAD, calm, declined w/c, steady gait, given Rx x2. Calling for cab.

## 2014-04-25 ENCOUNTER — Emergency Department (HOSPITAL_COMMUNITY)
Admission: EM | Admit: 2014-04-25 | Discharge: 2014-04-25 | Disposition: A | Discharge status: Home / Self Care | Payer: Medicare Other | Attending: Emergency Medicine | Admitting: Emergency Medicine

## 2014-04-25 ENCOUNTER — Encounter (HOSPITAL_COMMUNITY): Payer: Self-pay | Admitting: Emergency Medicine

## 2014-04-25 DIAGNOSIS — R51 Headache: Secondary | ICD-10-CM | POA: Insufficient documentation

## 2014-04-25 DIAGNOSIS — Z8544 Personal history of malignant neoplasm of other female genital organs: Secondary | ICD-10-CM | POA: Insufficient documentation

## 2014-04-25 DIAGNOSIS — E039 Hypothyroidism, unspecified: Secondary | ICD-10-CM | POA: Diagnosis not present

## 2014-04-25 DIAGNOSIS — F17200 Nicotine dependence, unspecified, uncomplicated: Secondary | ICD-10-CM | POA: Diagnosis not present

## 2014-04-25 DIAGNOSIS — Z880 Allergy status to penicillin: Secondary | ICD-10-CM | POA: Diagnosis not present

## 2014-04-25 DIAGNOSIS — Z8719 Personal history of other diseases of the digestive system: Secondary | ICD-10-CM | POA: Diagnosis not present

## 2014-04-25 DIAGNOSIS — G8929 Other chronic pain: Secondary | ICD-10-CM | POA: Diagnosis not present

## 2014-04-25 DIAGNOSIS — F1010 Alcohol abuse, uncomplicated: Secondary | ICD-10-CM | POA: Insufficient documentation

## 2014-04-25 DIAGNOSIS — Z8619 Personal history of other infectious and parasitic diseases: Secondary | ICD-10-CM | POA: Insufficient documentation

## 2014-04-25 DIAGNOSIS — Z9981 Dependence on supplemental oxygen: Secondary | ICD-10-CM | POA: Insufficient documentation

## 2014-04-25 DIAGNOSIS — R45851 Suicidal ideations: Secondary | ICD-10-CM | POA: Insufficient documentation

## 2014-04-25 DIAGNOSIS — Z3202 Encounter for pregnancy test, result negative: Secondary | ICD-10-CM | POA: Diagnosis not present

## 2014-04-25 DIAGNOSIS — F3289 Other specified depressive episodes: Secondary | ICD-10-CM | POA: Insufficient documentation

## 2014-04-25 DIAGNOSIS — G4730 Sleep apnea, unspecified: Secondary | ICD-10-CM | POA: Diagnosis not present

## 2014-04-25 DIAGNOSIS — M129 Arthropathy, unspecified: Secondary | ICD-10-CM | POA: Diagnosis not present

## 2014-04-25 DIAGNOSIS — F10920 Alcohol use, unspecified with intoxication, uncomplicated: Secondary | ICD-10-CM

## 2014-04-25 DIAGNOSIS — G609 Hereditary and idiopathic neuropathy, unspecified: Secondary | ICD-10-CM | POA: Insufficient documentation

## 2014-04-25 DIAGNOSIS — Z8742 Personal history of other diseases of the female genital tract: Secondary | ICD-10-CM | POA: Insufficient documentation

## 2014-04-25 DIAGNOSIS — F329 Major depressive disorder, single episode, unspecified: Secondary | ICD-10-CM | POA: Insufficient documentation

## 2014-04-25 LAB — CBC WITH DIFFERENTIAL/PLATELET
Basophils Absolute: 0 10*3/uL (ref 0.0–0.1)
Basophils Relative: 0 % (ref 0–1)
EOS ABS: 0.1 10*3/uL (ref 0.0–0.7)
EOS PCT: 1 % (ref 0–5)
HCT: 42.5 % (ref 36.0–46.0)
HEMOGLOBIN: 14.3 g/dL (ref 12.0–15.0)
Lymphocytes Relative: 29 % (ref 12–46)
Lymphs Abs: 2.8 10*3/uL (ref 0.7–4.0)
MCH: 32 pg (ref 26.0–34.0)
MCHC: 33.6 g/dL (ref 30.0–36.0)
MCV: 95.1 fL (ref 78.0–100.0)
MONOS PCT: 8 % (ref 3–12)
Monocytes Absolute: 0.8 10*3/uL (ref 0.1–1.0)
NEUTROS PCT: 62 % (ref 43–77)
Neutro Abs: 6.1 10*3/uL (ref 1.7–7.7)
Platelets: 326 10*3/uL (ref 150–400)
RBC: 4.47 MIL/uL (ref 3.87–5.11)
RDW: 14.5 % (ref 11.5–15.5)
WBC: 9.8 10*3/uL (ref 4.0–10.5)

## 2014-04-25 LAB — COMPREHENSIVE METABOLIC PANEL
ALK PHOS: 65 U/L (ref 39–117)
ALT: 29 U/L (ref 0–35)
AST: 30 U/L (ref 0–37)
Albumin: 3.8 g/dL (ref 3.5–5.2)
Anion gap: 18 — ABNORMAL HIGH (ref 5–15)
BUN: 10 mg/dL (ref 6–23)
CO2: 22 mEq/L (ref 19–32)
Calcium: 9 mg/dL (ref 8.4–10.5)
Chloride: 101 mEq/L (ref 96–112)
Creatinine, Ser: 0.82 mg/dL (ref 0.50–1.10)
GFR, EST NON AFRICAN AMERICAN: 89 mL/min — AB (ref >90)
GLUCOSE: 102 mg/dL — AB (ref 70–99)
POTASSIUM: 4.1 meq/L (ref 3.7–5.3)
Sodium: 141 mEq/L (ref 137–147)
TOTAL PROTEIN: 7.5 g/dL (ref 6.0–8.3)
Total Bilirubin: <0.2 mg/dL — ABNORMAL LOW (ref 0.3–1.2)

## 2014-04-25 LAB — RAPID URINE DRUG SCREEN, HOSP PERFORMED
Amphetamines: NOT DETECTED
BARBITURATES: NOT DETECTED
Benzodiazepines: NOT DETECTED
Cocaine: NOT DETECTED
Opiates: NOT DETECTED
Tetrahydrocannabinol: NOT DETECTED

## 2014-04-25 LAB — ACETAMINOPHEN LEVEL

## 2014-04-25 LAB — SALICYLATE LEVEL

## 2014-04-25 LAB — ETHANOL: ALCOHOL ETHYL (B): 314 mg/dL — AB (ref 0–11)

## 2014-04-25 LAB — POC URINE PREG, ED: PREG TEST UR: NEGATIVE

## 2014-04-25 MED ORDER — LORAZEPAM 1 MG PO TABS: 0.0000 mg | ORAL_TABLET | Freq: Two times a day (BID) | ORAL | Status: DC

## 2014-04-25 MED ORDER — SODIUM CHLORIDE 0.9 % IV BOLUS (SEPSIS)
1000.0000 mL | Freq: Once | INTRAVENOUS | Status: AC
  Administered 2014-04-25: 1000 mL via INTRAVENOUS

## 2014-04-25 MED ORDER — LORAZEPAM 1 MG PO TABS: 1.0000 mg | ORAL_TABLET | Freq: Four times a day (QID) | ORAL | Status: DC | PRN

## 2014-04-25 MED ORDER — IBUPROFEN 200 MG PO TABS
600.0000 mg | ORAL_TABLET | Freq: Three times a day (TID) | ORAL | Status: DC | PRN
Start: 2014-04-25 — End: 2014-04-26
  Administered 2014-04-25: 600 mg via ORAL
  Filled 2014-04-25: qty 3

## 2014-04-25 MED ORDER — THIAMINE HCL 100 MG/ML IJ SOLN: 100.0000 mg | Freq: Every day | INTRAMUSCULAR | Status: DC

## 2014-04-25 MED ORDER — GABAPENTIN 400 MG PO CAPS
800.0000 mg | ORAL_CAPSULE | Freq: Four times a day (QID) | ORAL | Status: DC
  Administered 2014-04-25: 800 mg via ORAL
  Filled 2014-04-25: qty 2

## 2014-04-25 MED ORDER — VITAMIN B-1 100 MG PO TABS
100.0000 mg | ORAL_TABLET | Freq: Every day | ORAL | Status: DC
  Administered 2014-04-25: 100 mg via ORAL
  Filled 2014-04-25: qty 1

## 2014-04-25 MED ORDER — PAROXETINE HCL 20 MG PO TABS: 40.0000 mg | ORAL_TABLET | Freq: Every day | ORAL | Status: DC

## 2014-04-25 MED ORDER — LORAZEPAM 2 MG/ML IJ SOLN: 1.0000 mg | Freq: Four times a day (QID) | INTRAMUSCULAR | Status: DC | PRN

## 2014-04-25 MED ORDER — ADULT MULTIVITAMIN W/MINERALS CH
1.0000 | ORAL_TABLET | Freq: Every day | ORAL | Status: DC
  Administered 2014-04-25: 1 via ORAL
  Filled 2014-04-25: qty 1

## 2014-04-25 MED ORDER — LEVOTHYROXINE SODIUM 50 MCG PO TABS
50.0000 ug | ORAL_TABLET | Freq: Every day | ORAL | Status: DC
  Filled 2014-04-25: qty 1

## 2014-04-25 MED ORDER — LORAZEPAM 1 MG PO TABS
0.0000 mg | ORAL_TABLET | Freq: Four times a day (QID) | ORAL | Status: DC
  Administered 2014-04-25: 1 mg via ORAL
  Filled 2014-04-25: qty 1

## 2014-04-25 MED ORDER — FOLIC ACID 1 MG PO TABS
1.0000 mg | ORAL_TABLET | Freq: Every day | ORAL | Status: DC
  Administered 2014-04-25: 1 mg via ORAL
  Filled 2014-04-25: qty 1

## 2014-04-25 MED ORDER — CYCLOBENZAPRINE HCL 10 MG PO TABS
10.0000 mg | ORAL_TABLET | Freq: Two times a day (BID) | ORAL | Status: DC | PRN
  Administered 2014-04-25: 10 mg via ORAL
  Filled 2014-04-25: qty 1
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
## 2014-04-25 NOTE — ED Notes (Signed)
Pt in hallway yelling that she wants to leave and to take the IV out, EDP at bedside, okayed for pt to be discharged, pts IV taken out and clothes given back to pt.

## 2014-04-25 NOTE — Discharge Instructions (Signed)
Alcohol Intoxication  Alcohol intoxication occurs when the amount of alcohol that a person has consumed impairs his or her ability to mentally and physically function. Alcohol directly impairs the normal chemical activity of the brain. Drinking large amounts of alcohol can lead to changes in mental function and behavior, and it can cause many physical effects that can be harmful.   Alcohol intoxication can range in severity from mild to very severe. Various factors can affect the level of intoxication that occurs, such as the person's age, gender, weight, frequency of alcohol consumption, and the presence of other medical conditions (such as diabetes, seizures, or heart conditions). Dangerous levels of alcohol intoxication may occur when people drink large amounts of alcohol in a short period (binge drinking). Alcohol can also be especially dangerous when combined with certain prescription medicines or "recreational" drugs.  SIGNS AND SYMPTOMS  Some common signs and symptoms of mild alcohol intoxication include:  · Loss of coordination.  · Changes in mood and behavior.  · Impaired judgment.  · Slurred speech.  As alcohol intoxication progresses to more severe levels, other signs and symptoms will appear. These may include:  · Vomiting.  · Confusion and impaired memory.  · Slowed breathing.  · Seizures.  · Loss of consciousness.  DIAGNOSIS   Your health care provider will take a medical history and perform a physical exam. You will be asked about the amount and type of alcohol you have consumed. Blood tests will be done to measure the concentration of alcohol in your blood. In many places, your blood alcohol level must be lower than 80 mg/dL (0.08%) to legally drive. However, many dangerous effects of alcohol can occur at much lower levels.   TREATMENT   People with alcohol intoxication often do not require treatment. Most of the effects of alcohol intoxication are temporary, and they go away as the alcohol naturally  leaves the body. Your health care provider will monitor your condition until you are stable enough to go home. Fluids are sometimes given through an IV access tube to help prevent dehydration.   HOME CARE INSTRUCTIONS  · Do not drive after drinking alcohol.  · Stay hydrated. Drink enough water and fluids to keep your urine clear or pale yellow. Avoid caffeine.    · Only take over-the-counter or prescription medicines as directed by your health care provider.    SEEK MEDICAL CARE IF:   · You have persistent vomiting.    · You do not feel better after a few days.  · You have frequent alcohol intoxication. Your health care provider can help determine if you should see a substance use treatment counselor.  SEEK IMMEDIATE MEDICAL CARE IF:   · You become shaky or tremble when you try to stop drinking.    · You shake uncontrollably (seizure).    · You throw up (vomit) blood. This may be bright red or may look like black coffee grounds.    · You have blood in your stool. This may be bright red or may appear as a black, tarry, bad smelling stool.    · You become lightheaded or faint.    MAKE SURE YOU:   · Understand these instructions.  · Will watch your condition.  · Will get help right away if you are not doing well or get worse.  Document Released: 04/22/2005 Document Revised: 03/15/2013 Document Reviewed: 12/16/2012  Exit[employer]® Patient Information ©2015 [employer]. This information is not intended to replace advice given to you by your health care provider. Make sure   you discuss any questions you have with your health care provider.

## 2014-04-25 NOTE — ED Notes (Signed)
Pt walked out by NT to meet ride.

## 2014-04-25 NOTE — ED Notes (Signed)
Bed: WA12 Expected date:  Expected time:  Means of arrival:  Comments: EMS 
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
## 2014-05-02 ENCOUNTER — Encounter (HOSPITAL_COMMUNITY): Payer: Self-pay | Admitting: Emergency Medicine

## 2014-05-02 ENCOUNTER — Emergency Department (HOSPITAL_COMMUNITY)
Admission: EM | Admit: 2014-05-02 | Discharge: 2014-05-03 | Disposition: A | Discharge status: Other Acute Inpatient Hospital | Payer: Medicare Other | Attending: Emergency Medicine | Admitting: Emergency Medicine

## 2014-05-02 DIAGNOSIS — F1010 Alcohol abuse, uncomplicated: Secondary | ICD-10-CM | POA: Diagnosis present

## 2014-05-02 DIAGNOSIS — Z880 Allergy status to penicillin: Secondary | ICD-10-CM | POA: Diagnosis not present

## 2014-05-02 DIAGNOSIS — M549 Dorsalgia, unspecified: Secondary | ICD-10-CM | POA: Insufficient documentation

## 2014-05-02 DIAGNOSIS — G609 Hereditary and idiopathic neuropathy, unspecified: Secondary | ICD-10-CM | POA: Insufficient documentation

## 2014-05-02 DIAGNOSIS — Z859 Personal history of malignant neoplasm, unspecified: Secondary | ICD-10-CM | POA: Insufficient documentation

## 2014-05-02 DIAGNOSIS — Z8742 Personal history of other diseases of the female genital tract: Secondary | ICD-10-CM | POA: Diagnosis not present

## 2014-05-02 DIAGNOSIS — Z8719 Personal history of other diseases of the digestive system: Secondary | ICD-10-CM | POA: Diagnosis not present

## 2014-05-02 DIAGNOSIS — Z862 Personal history of diseases of the blood and blood-forming organs and certain disorders involving the immune mechanism: Secondary | ICD-10-CM | POA: Insufficient documentation

## 2014-05-02 DIAGNOSIS — Z79899 Other long term (current) drug therapy: Secondary | ICD-10-CM | POA: Insufficient documentation

## 2014-05-02 DIAGNOSIS — F419 Anxiety disorder, unspecified: Secondary | ICD-10-CM | POA: Diagnosis not present

## 2014-05-02 DIAGNOSIS — Z720 Tobacco use: Secondary | ICD-10-CM | POA: Insufficient documentation

## 2014-05-02 DIAGNOSIS — G8929 Other chronic pain: Secondary | ICD-10-CM | POA: Insufficient documentation

## 2014-05-02 DIAGNOSIS — E039 Hypothyroidism, unspecified: Secondary | ICD-10-CM | POA: Insufficient documentation

## 2014-05-02 DIAGNOSIS — M1990 Unspecified osteoarthritis, unspecified site: Secondary | ICD-10-CM | POA: Diagnosis not present

## 2014-05-02 LAB — COMPREHENSIVE METABOLIC PANEL
ALK PHOS: 68 U/L (ref 39–117)
ALT: 42 U/L — AB (ref 0–35)
AST: 43 U/L — ABNORMAL HIGH (ref 0–37)
Albumin: 3.9 g/dL (ref 3.5–5.2)
Anion gap: 18 — ABNORMAL HIGH (ref 5–15)
BUN: 10 mg/dL (ref 6–23)
CHLORIDE: 103 meq/L (ref 96–112)
CO2: 22 meq/L (ref 19–32)
Calcium: 9.1 mg/dL (ref 8.4–10.5)
Creatinine, Ser: 0.88 mg/dL (ref 0.50–1.10)
GFR calc Af Amer: >90 mL/min (ref >90)
GFR calc non Af Amer: 82 mL/min — ABNORMAL LOW (ref >90)
GLUCOSE: 102 mg/dL — AB (ref 70–99)
POTASSIUM: 3.9 meq/L (ref 3.7–5.3)
SODIUM: 143 meq/L (ref 137–147)
Total Protein: 8 g/dL (ref 6.0–8.3)

## 2014-05-02 LAB — PREGNANCY, URINE: PREG TEST UR: NEGATIVE

## 2014-05-02 LAB — CBC
HEMATOCRIT: 44 % (ref 36.0–46.0)
HEMOGLOBIN: 14.9 g/dL (ref 12.0–15.0)
MCH: 32.2 pg (ref 26.0–34.0)
MCHC: 33.9 g/dL (ref 30.0–36.0)
MCV: 95 fL (ref 78.0–100.0)
Platelets: 323 10*3/uL (ref 150–400)
RBC: 4.63 MIL/uL (ref 3.87–5.11)
RDW: 14.5 % (ref 11.5–15.5)
WBC: 11.3 10*3/uL — ABNORMAL HIGH (ref 4.0–10.5)

## 2014-05-02 LAB — RAPID URINE DRUG SCREEN, HOSP PERFORMED
Amphetamines: NOT DETECTED
BARBITURATES: NOT DETECTED
Benzodiazepines: NOT DETECTED
Cocaine: POSITIVE — AB
Opiates: POSITIVE — AB
Tetrahydrocannabinol: NOT DETECTED

## 2014-05-02 LAB — ETHANOL: Alcohol, Ethyl (B): 198 mg/dL — ABNORMAL HIGH (ref 0–11)

## 2014-05-02 MED ORDER — LEVOTHYROXINE SODIUM 50 MCG PO TABS
50.0000 ug | ORAL_TABLET | Freq: Every day | ORAL | Status: DC
  Filled 2014-05-02: qty 1

## 2014-05-02 MED ORDER — LORAZEPAM 1 MG PO TABS
0.0000 mg | ORAL_TABLET | Freq: Four times a day (QID) | ORAL | Status: DC
  Administered 2014-05-03: 2 mg via ORAL
  Filled 2014-05-02: qty 2

## 2014-05-02 MED ORDER — HYDROCODONE-ACETAMINOPHEN 5-325 MG PO TABS
1.0000 | ORAL_TABLET | Freq: Four times a day (QID) | ORAL | Status: DC | PRN
  Administered 2014-05-02 (×2): 1 via ORAL
  Filled 2014-05-02 (×2): qty 1

## 2014-05-02 MED ORDER — LORAZEPAM 1 MG PO TABS: 0.0000 mg | ORAL_TABLET | Freq: Two times a day (BID) | ORAL | Status: DC

## 2014-05-02 MED ORDER — IBUPROFEN 200 MG PO TABS: 600.0000 mg | ORAL_TABLET | Freq: Three times a day (TID) | ORAL | Status: DC | PRN

## 2014-05-02 MED ORDER — ACETAMINOPHEN 325 MG PO TABS
650.0000 mg | ORAL_TABLET | ORAL | Status: DC | PRN
Start: 2014-05-02 — End: 2014-05-03

## 2014-05-02 MED ORDER — METHOCARBAMOL 500 MG PO TABS
500.0000 mg | ORAL_TABLET | Freq: Three times a day (TID) | ORAL | Status: DC | PRN
  Administered 2014-05-02: 500 mg via ORAL
  Filled 2014-05-02: qty 1

## 2014-05-02 MED ORDER — PAROXETINE HCL 20 MG PO TABS: 40.0000 mg | ORAL_TABLET | Freq: Every day | ORAL | Status: DC

## 2014-05-02 MED ORDER — NICOTINE 21 MG/24HR TD PT24
21.0000 mg | MEDICATED_PATCH | Freq: Every day | TRANSDERMAL | Status: DC
  Administered 2014-05-02: 21 mg via TRANSDERMAL
  Filled 2014-05-02: qty 1

## 2014-05-02 MED ORDER — GABAPENTIN 400 MG PO CAPS
800.0000 mg | ORAL_CAPSULE | Freq: Four times a day (QID) | ORAL | Status: DC
  Administered 2014-05-02 (×2): 800 mg via ORAL
  Filled 2014-05-02 (×2): qty 2

## 2014-05-02 MED ORDER — ZOLPIDEM TARTRATE 5 MG PO TABS
5.0000 mg | ORAL_TABLET | Freq: Every evening | ORAL | Status: DC | PRN
  Administered 2014-05-02: 5 mg via ORAL
  Filled 2014-05-02: qty 1

## 2014-05-02 MED ORDER — LORAZEPAM 1 MG PO TABS
1.0000 mg | ORAL_TABLET | Freq: Once | ORAL | Status: AC
  Administered 2014-05-02: 1 mg via ORAL
  Filled 2014-05-02: qty 1

## 2014-05-02 MED ORDER — ONDANSETRON HCL 4 MG PO TABS: 4.0000 mg | ORAL_TABLET | Freq: Three times a day (TID) | ORAL | Status: DC | PRN

## 2014-05-02 MED ORDER — HYDROXYZINE HCL 25 MG PO TABS
50.0000 mg | ORAL_TABLET | Freq: Once | ORAL | Status: DC
  Filled 2014-05-02: qty 2
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
## 2014-05-02 NOTE — ED Notes (Signed)
Dinner tray was given. Nurse was notified.

## 2014-05-02 NOTE — ED Notes (Signed)
Per EMS, pt from home.  Pt wants detox from alcohol.  Pt drank 1/4 of fifth of liquor today. Vitals: 140/80, hr 100, resp 16, cbg 238.  Ambulatory on arrival.

## 2014-05-02 NOTE — ED Notes (Signed)
Patient requesting something for pain and anxiety. Explained to patient next Ativan and Vicodin doses are not scheduled at this time and Robaxin is ordered if needed. Patient requesting Flexeril instead of Robaxin and requesting additional Vicodin dose. Dr. Venora Maples made aware of same. Verbal orders for additional Vicodin dose at this time. No change in plan of care on Robaxin.

## 2014-05-03 ENCOUNTER — Encounter (HOSPITAL_COMMUNITY): Payer: Self-pay

## 2014-05-03 ENCOUNTER — Encounter (HOSPITAL_COMMUNITY): Payer: Self-pay | Admitting: Emergency Medicine

## 2014-05-03 ENCOUNTER — Inpatient Hospital Stay (HOSPITAL_COMMUNITY)
Admission: EM | Admit: 2014-05-03 | Discharge: 2014-05-07 | DRG: 897 | Disposition: A | Discharge status: Home / Self Care | Payer: No Typology Code available for payment source | Source: Intra-hospital | Attending: Psychiatry | Admitting: Psychiatry

## 2014-05-03 DIAGNOSIS — F1010 Alcohol abuse, uncomplicated: Secondary | ICD-10-CM | POA: Diagnosis not present

## 2014-05-03 DIAGNOSIS — G4700 Insomnia, unspecified: Secondary | ICD-10-CM | POA: Diagnosis present

## 2014-05-03 DIAGNOSIS — R45851 Suicidal ideations: Secondary | ICD-10-CM | POA: Diagnosis present

## 2014-05-03 DIAGNOSIS — G8929 Other chronic pain: Secondary | ICD-10-CM

## 2014-05-03 DIAGNOSIS — F10239 Alcohol dependence with withdrawal, unspecified: Secondary | ICD-10-CM | POA: Diagnosis present

## 2014-05-03 DIAGNOSIS — F1994 Other psychoactive substance use, unspecified with psychoactive substance-induced mood disorder: Secondary | ICD-10-CM | POA: Diagnosis present

## 2014-05-03 DIAGNOSIS — Z823 Family history of stroke: Secondary | ICD-10-CM | POA: Diagnosis not present

## 2014-05-03 DIAGNOSIS — F4310 Post-traumatic stress disorder, unspecified: Secondary | ICD-10-CM

## 2014-05-03 DIAGNOSIS — E039 Hypothyroidism, unspecified: Secondary | ICD-10-CM | POA: Diagnosis present

## 2014-05-03 DIAGNOSIS — M170 Bilateral primary osteoarthritis of knee: Secondary | ICD-10-CM | POA: Diagnosis present

## 2014-05-03 DIAGNOSIS — F4312 Post-traumatic stress disorder, chronic: Secondary | ICD-10-CM | POA: Diagnosis present

## 2014-05-03 DIAGNOSIS — G4730 Sleep apnea, unspecified: Secondary | ICD-10-CM | POA: Diagnosis present

## 2014-05-03 DIAGNOSIS — Z8249 Family history of ischemic heart disease and other diseases of the circulatory system: Secondary | ICD-10-CM | POA: Diagnosis not present

## 2014-05-03 DIAGNOSIS — Z806 Family history of leukemia: Secondary | ICD-10-CM

## 2014-05-03 DIAGNOSIS — Z599 Problem related to housing and economic circumstances, unspecified: Secondary | ICD-10-CM

## 2014-05-03 DIAGNOSIS — F10280 Alcohol dependence with alcohol-induced anxiety disorder: Secondary | ICD-10-CM

## 2014-05-03 DIAGNOSIS — F17210 Nicotine dependence, cigarettes, uncomplicated: Secondary | ICD-10-CM | POA: Diagnosis present

## 2014-05-03 DIAGNOSIS — F411 Generalized anxiety disorder: Secondary | ICD-10-CM

## 2014-05-03 DIAGNOSIS — F418 Other specified anxiety disorders: Secondary | ICD-10-CM | POA: Diagnosis present

## 2014-05-03 DIAGNOSIS — M549 Dorsalgia, unspecified: Secondary | ICD-10-CM

## 2014-05-03 DIAGNOSIS — Z833 Family history of diabetes mellitus: Secondary | ICD-10-CM

## 2014-05-03 DIAGNOSIS — K219 Gastro-esophageal reflux disease without esophagitis: Secondary | ICD-10-CM | POA: Diagnosis present

## 2014-05-03 LAB — LIPID PANEL
Cholesterol: 235 mg/dL — ABNORMAL HIGH (ref 0–200)
HDL: 59 mg/dL (ref >39)
LDL Cholesterol: UNDETERMINED mg/dL (ref 0–99)
Total CHOL/HDL Ratio: 4 RATIO
Triglycerides: 477 mg/dL — ABNORMAL HIGH (ref <150)
VLDL: UNDETERMINED mg/dL (ref 0–40)

## 2014-05-03 LAB — HEMOGLOBIN A1C
Hgb A1c MFr Bld: 5.5 % (ref <5.7)
Mean Plasma Glucose: 111 mg/dL (ref <117)

## 2014-05-03 LAB — TSH: TSH: 2.64 u[IU]/mL (ref 0.350–4.500)

## 2014-05-03 MED ORDER — ADULT MULTIVITAMIN W/MINERALS CH
1.0000 | ORAL_TABLET | Freq: Every day | ORAL | Status: DC
  Administered 2014-05-03 – 2014-05-07 (×5): 1 via ORAL
  Filled 2014-05-03 (×7): qty 1

## 2014-05-03 MED ORDER — MAGNESIUM HYDROXIDE 400 MG/5ML PO SUSP: 30.0000 mL | Freq: Every day | ORAL | Status: DC | PRN

## 2014-05-03 MED ORDER — LEVOTHYROXINE SODIUM 50 MCG PO TABS
50.0000 ug | ORAL_TABLET | Freq: Every day | ORAL | Status: DC
  Administered 2014-05-03 – 2014-05-07 (×5): 50 ug via ORAL
  Filled 2014-05-03 (×2): qty 1
  Filled 2014-05-03: qty 7
  Filled 2014-05-03 (×5): qty 1

## 2014-05-03 MED ORDER — ACETAMINOPHEN 325 MG PO TABS
650.0000 mg | ORAL_TABLET | Freq: Four times a day (QID) | ORAL | Status: DC | PRN
  Administered 2014-05-03: 650 mg via ORAL
  Filled 2014-05-03: qty 2

## 2014-05-03 MED ORDER — HYDROXYZINE HCL 25 MG PO TABS
25.0000 mg | ORAL_TABLET | Freq: Four times a day (QID) | ORAL | Status: AC | PRN
  Administered 2014-05-03 – 2014-05-05 (×5): 25 mg via ORAL
  Filled 2014-05-03 (×5): qty 1

## 2014-05-03 MED ORDER — GABAPENTIN 400 MG PO CAPS
800.0000 mg | ORAL_CAPSULE | Freq: Four times a day (QID) | ORAL | Status: DC
  Administered 2014-05-03 – 2014-05-07 (×18): 800 mg via ORAL
  Filled 2014-05-03 (×10): qty 2
  Filled 2014-05-03 (×2): qty 56
  Filled 2014-05-03 (×12): qty 2
  Filled 2014-05-03: qty 56
  Filled 2014-05-03 (×3): qty 2
  Filled 2014-05-03: qty 56

## 2014-05-03 MED ORDER — CHLORDIAZEPOXIDE HCL 25 MG PO CAPS
25.0000 mg | ORAL_CAPSULE | Freq: Four times a day (QID) | ORAL | Status: AC
  Administered 2014-05-03 (×4): 25 mg via ORAL
  Filled 2014-05-03 (×4): qty 1

## 2014-05-03 MED ORDER — THIAMINE HCL 100 MG/ML IJ SOLN
100.0000 mg | Freq: Once | INTRAMUSCULAR | Status: AC
  Administered 2014-05-03: 100 mg via INTRAMUSCULAR
  Filled 2014-05-03: qty 2

## 2014-05-03 MED ORDER — CHLORDIAZEPOXIDE HCL 25 MG PO CAPS
25.0000 mg | ORAL_CAPSULE | Freq: Three times a day (TID) | ORAL | Status: AC
  Administered 2014-05-04 (×3): 25 mg via ORAL
  Filled 2014-05-03 (×3): qty 1

## 2014-05-03 MED ORDER — IBUPROFEN 800 MG PO TABS
800.0000 mg | ORAL_TABLET | Freq: Three times a day (TID) | ORAL | Status: DC | PRN
  Administered 2014-05-03 – 2014-05-06 (×9): 800 mg via ORAL
  Filled 2014-05-03 (×9): qty 1

## 2014-05-03 MED ORDER — LOPERAMIDE HCL 2 MG PO CAPS
2.0000 mg | ORAL_CAPSULE | ORAL | Status: AC | PRN
  Administered 2014-05-03: 4 mg via ORAL
  Administered 2014-05-04 (×3): 2 mg via ORAL
  Filled 2014-05-03 (×2): qty 1
  Filled 2014-05-03 (×2): qty 2

## 2014-05-03 MED ORDER — VITAMIN B-1 100 MG PO TABS
100.0000 mg | ORAL_TABLET | Freq: Every day | ORAL | Status: DC
  Administered 2014-05-04 – 2014-05-07 (×4): 100 mg via ORAL
  Filled 2014-05-03 (×7): qty 1

## 2014-05-03 MED ORDER — METHOCARBAMOL 500 MG PO TABS
500.0000 mg | ORAL_TABLET | Freq: Three times a day (TID) | ORAL | Status: DC | PRN
  Administered 2014-05-03 – 2014-05-04 (×3): 500 mg via ORAL
  Filled 2014-05-03 (×3): qty 1

## 2014-05-03 MED ORDER — TRAZODONE HCL 50 MG PO TABS
50.0000 mg | ORAL_TABLET | Freq: Every evening | ORAL | Status: DC | PRN
  Filled 2014-05-03 (×2): qty 1

## 2014-05-03 MED ORDER — ONDANSETRON 4 MG PO TBDP: 4.0000 mg | ORAL_TABLET | Freq: Four times a day (QID) | ORAL | Status: AC | PRN

## 2014-05-03 MED ORDER — CHLORDIAZEPOXIDE HCL 25 MG PO CAPS
25.0000 mg | ORAL_CAPSULE | Freq: Every day | ORAL | Status: AC
  Administered 2014-05-06: 25 mg via ORAL
  Filled 2014-05-03: qty 1

## 2014-05-03 MED ORDER — DICYCLOMINE HCL 20 MG PO TABS: 20.0000 mg | ORAL_TABLET | Freq: Four times a day (QID) | ORAL | Status: DC | PRN

## 2014-05-03 MED ORDER — ALUM & MAG HYDROXIDE-SIMETH 200-200-20 MG/5ML PO SUSP: 30.0000 mL | ORAL | Status: DC | PRN

## 2014-05-03 MED ORDER — NICOTINE 21 MG/24HR TD PT24
21.0000 mg | MEDICATED_PATCH | Freq: Every day | TRANSDERMAL | Status: DC
  Administered 2014-05-03 – 2014-05-07 (×5): 21 mg via TRANSDERMAL
  Filled 2014-05-03: qty 1
  Filled 2014-05-03: qty 14
  Filled 2014-05-03 (×5): qty 1

## 2014-05-03 MED ORDER — PAROXETINE HCL 20 MG PO TABS
40.0000 mg | ORAL_TABLET | Freq: Every day | ORAL | Status: DC
  Administered 2014-05-03 – 2014-05-04 (×2): 40 mg via ORAL
  Filled 2014-05-03 (×3): qty 2

## 2014-05-03 MED ORDER — CHLORDIAZEPOXIDE HCL 25 MG PO CAPS
25.0000 mg | ORAL_CAPSULE | ORAL | Status: AC
  Administered 2014-05-05 (×2): 25 mg via ORAL
  Filled 2014-05-03 (×2): qty 1

## 2014-05-03 MED ORDER — CHLORDIAZEPOXIDE HCL 25 MG PO CAPS
25.0000 mg | ORAL_CAPSULE | Freq: Four times a day (QID) | ORAL | Status: AC | PRN
  Administered 2014-05-03 – 2014-05-05 (×8): 25 mg via ORAL
  Filled 2014-05-03 (×9): qty 1

## 2014-05-03 MED ORDER — NAPROXEN 500 MG PO TABS: 500.0000 mg | ORAL_TABLET | Freq: Two times a day (BID) | ORAL | Status: DC | PRN
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
## 2014-05-03 NOTE — Tx Team (Signed)
Initial Interdisciplinary Treatment Plan   PATIENT STRESSORS: Substance abuse   PROBLEM LIST: Problem List/Patient Goals Date to be addressed Date deferred Reason deferred Estimated date of resolution  Substance  abuse      depression      Anxiety\[id number]\                                           DISCHARGE CRITERIA:  Reduction of life-threatening or endangering symptoms to within safe limits Verbal commitment to aftercare and medication compliance Withdrawal symptoms are absent or subacute and managed without 24-hour nursing intervention  PRELIMINARY DISCHARGE PLAN: Attend 12-step recovery group Outpatient therapy Return to previous living arrangement  PATIENT/FAMIILY INVOLVEMENT: This treatment plan has been presented to and reviewed with the patient, Larena Glassman [place], and/or family member,   The patient and family have been given the opportunity to ask questions and make suggestions.  JEHU-APPIAH, Jin Capote K 05/03/2014, 3:08 AM

## 2014-05-03 NOTE — Progress Notes (Signed)
Patient ID: [name], female   DOB: October 20, 1975, 38 y.o.   MRN: [id number] Pt woke up c/o anxiety. Pt crying stating there is something wrong with her. Pt has been c/o of increase anxiety after discharge and not able to afford medication. Pt complains of chronic back pain. Pt reports she wants to be on pain medicine while in the hospital but does not want to take it at home due to fear of addiction. Medication for pain and anxiety given as prescribed. Pt asked for reading material from NA which was also provided.
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
## 2014-05-03 NOTE — Progress Notes (Signed)
Patient ID: [name], female   DOB: 12-20-1975, 38 y.o.   MRN: [id number] D: Patient reports her needs were not met today because she has  been labeled as a "med seeker". Pt reports after a phone call with her daughter who stated "she missed her". Pt goal is to get clean and be a better mother to a daughter. Pt observed interacting with peers in the dayroom. Pt denies SI/HI/AVH. Pt attended evening wrap NA group and engaged in discussion. No acute distressed noted at this time.   A: Met with pt 1:1. Medications administered as prescribed. Writer encouraged pt to discuss feelings. Pt encouraged to come to staff with any questions or concerns.   R: Patient is safe on the unit. She is complaint with medications and denies any adverse reaction. Continue current POC.

## 2014-05-03 NOTE — Progress Notes (Signed)
Pt was in bed most of the [place] until about 1400.  She refused to come to medication window for her medications and refused to fill out her self-inventory.  She finally answered questions about her depression 9 hopelessness a 8 and anxiety a 10.  She denied any S/H ideation or A/V/H.  She requested many prn medications throughout the [place]. She kept asking for percocet and demanding that the doctor to be called and asked if she could have the percocet.  This nurse did speak with Dr. Parke Poisson and he did not want to give her any other pain medications other than what she is already ordered.  Pt was aware of this but continues to ask.
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
## 2014-05-03 NOTE — BHH Group Notes (Signed)
0900 nursing orientation group    The focus of this group is to educate the patient on the purpose and policies of crisis stabilization and provide a format to answer questions about their admission.  The group details unit policies and expectations of patients while admitted.  Pt did not attend she was in bed asleep and would not get up for the group.

## 2014-05-03 NOTE — BHH Group Notes (Signed)
[place] LCSW Group Therapy  [facility] 1:15 - 2:30 PM  05/03/2014 3:40 PM   Type of Therapy:  Group Therapy  Participation Level: Active  Participation Quality:  Attentive  Affect:  Appropriate  Cognitive:  Appropriate  Insight:  Developing/Improving   Engagement in Therapy:  Developing/Improving   Modes of Intervention:  Discussion, Education, Exploration, Problem-Solving, Rapport Building, Support   Summary of Progress/Problems:   Patient was attentive to speaker from the Mental [facility] Association as he shared his story of dealing with mental health/substance abuse issues and overcoming it by working a recovery program.  Patient expressed interest in their programs and services and received information on their agency.  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  Patient thanked the speaker for sharing his story.  Concha Pyo 05/03/2014 3:40 PM
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
## 2014-05-03 NOTE — Clinical Social Work Note (Addendum)
CSW attempted to meet with patient to complete PSA.  She advised of not feeling well and asked that CSW come back later. CSW attempted to meet with patient again to complete PSA.  She continues to say she is not feeling well enough to complete PSA.

## 2014-05-04 MED ORDER — MIRTAZAPINE 7.5 MG PO TABS
7.5000 mg | ORAL_TABLET | Freq: Every day | ORAL | Status: DC
  Administered 2014-05-04 – 2014-05-06 (×3): 7.5 mg via ORAL
  Filled 2014-05-04 (×2): qty 1
  Filled 2014-05-04: qty 7
  Filled 2014-05-04 (×3): qty 1

## 2014-05-04 MED ORDER — METHOCARBAMOL 750 MG PO TABS
750.0000 mg | ORAL_TABLET | Freq: Three times a day (TID) | ORAL | Status: DC | PRN
  Administered 2014-05-04 – 2014-05-07 (×5): 750 mg via ORAL
  Filled 2014-05-04 (×6): qty 1
  Filled 2014-05-04: qty 8

## 2014-05-04 MED ORDER — PNEUMOCOCCAL VAC POLYVALENT 25 MCG/0.5ML IJ INJ
0.5000 mL | INJECTION | INTRAMUSCULAR | Status: AC
  Administered 2014-05-05: 0.5 mL via INTRAMUSCULAR

## 2014-05-04 MED ORDER — PAROXETINE HCL 30 MG PO TABS
60.0000 mg | ORAL_TABLET | Freq: Every day | ORAL | Status: DC
  Filled 2014-05-04 (×2): qty 2

## 2014-05-04 MED ORDER — PAROXETINE HCL 20 MG PO TABS
50.0000 mg | ORAL_TABLET | Freq: Every day | ORAL | Status: DC
  Administered 2014-05-05 – 2014-05-07 (×3): 50 mg via ORAL
  Filled 2014-05-04: qty 3
  Filled 2014-05-04: qty 2.5
  Filled 2014-05-04: qty 21
  Filled 2014-05-04 (×4): qty 2.5
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
## 2014-05-04 NOTE — BHH Group Notes (Signed)
[facility] LCSW Group Therapy  Feelings Around Relapse 1:15 -2:30        05/04/2014   Type of Therapy:  Group Therapy  Participation Level:  Appropriate  Participation Quality:  Appropriate  Affect:  Appropriate  Cognitive:  Attentive Appropriate  Insight:  Developing/Improving, Engaged  Engagement in Therapy: Developing/Improving, Engaged  Modes of Intervention:  Discussion Exploration Problem-Solving Supportive  Summary of Progress/Problems:  The topic for today was feelings around relapse.    Patient processed feelings toward relapse and was able to relate to peers. She stated her experience was the same as another group member and advised they are in the same "Room" (AA Group).  She shared she had five years of sobriety and stopped following through with her contact in [place].  Patient identified coping skills that can be used to prevent a relapse including getting back with AA and working the program.   [name] 05/04/2014

## 2014-05-04 NOTE — Progress Notes (Addendum)
[name] has done a good job today in that she , despite how bad she has felt, she attends her groups and she is engaged in her work here.   A She   Is medicated per MD order.  She denies active SI and states she could have problems with her diarrhea and that she might not be able to meks it     R POC maintianed. Safety is in place.

## 2014-05-04 NOTE — BHH Group Notes (Signed)
[facility] LCSW Aftercare Discharge Planning Group Note   05/04/2014 11:31 AM    Participation Quality:  Appropraite  Mood/Affect:  Appropriate  Depression Rating:  3  Anxiety Rating:  9  Thoughts of Suicide:  No  Will you contract for safety?   NA  Current AVH:  No  Plan for Discharge/Comments:  Patient attended discharge planning group and actively participated in group.  She advised of not having an outpatient patient provider but requesting assistance with referral.  Patient is agreeable to follow up with the [facility].  Patient has home and transportation.   Transportation Means: Patient has transportation.   Supports:  Patient has a support system.   Tajh Livsey, [place] Post

## 2014-05-04 NOTE — Plan of Care (Signed)
Problem: Alteration in mood & ability to function due to Goal: STG-Patient will comply with prescribed medication regimen (Patient will comply with prescribed medication regimen)  Outcome: Progressing Pt compliant with medication regimen

## 2014-05-04 NOTE — Progress Notes (Signed)
Adult Psychoeducational Group Note  Date:  05/04/2014 Time:  11:00 PM  Group Topic/Focus:  AA  Participation Level:  Active  Participation Quality:  Appropriate, Attentive and Sharing  Affect:  Appropriate  Cognitive:  Alert and Appropriate  Insight: Good  Engagement in Group:  Engaged  Modes of Intervention:  Discussion and Education  Additional Comments:    Migdalia Dk 05/04/2014, 11:00 PM

## 2014-05-04 NOTE — Progress Notes (Addendum)
[facility] MD Progress Note  05/04/2014 11:00 AM [name]  MRN:  [id number] Subjective:  Patient reports ongoing anxiety/depression, and describes ongoing  back pain. Objective:  Case discussed with treatment team. I have also met with patient.  Patient remains labile, and tearful , particularly regarding family strain. States her parents, although prosperous , refuse to give her money because " they are practicing tough love" but states that more than money or tangible help she would want more emotional support as well. She is interested in a family meeting , if possible, and with her express consent I spoke with her father- a family meeting has been scheduled for next Monday at 50, 00 AM. Patient reassured and looking forward to this meeting. Regarding pain issues, she has been focused on obtaining narcotics, but today states that she understands rationale to avoid or minimize use of these. She does state that Neurontin and Robaxin PRNs are helping partially. We discussed potential option of adding Cymbalta, which she states she took in the past, but at this time prefers to continue Paxil, which she states has been helpful and well tolerated. She is currently not presenting with any gross tremors or diaphoresis, and does not appear to be in any acute distress. 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  Respiratory: Negative for cough.   Cardiovascular: Negative for chest pain.  Musculoskeletal: Positive for back pain.  Skin:  Negative for rash.  Psychiatric/Behavioral: Positive for depression and substance abuse. Negative for hallucinations. The patient is nervous/anxious.     Blood pressure 146/100, pulse 95, temperature 98.3 F (36.8 C), temperature source Oral, resp. rate 18, height 5' 6" (1.676 m), weight 144.244 kg (318 lb), last menstrual period 04/26/2014, SpO2 99.00%.Body mass index is 51.35 kg/(m^2).  General Appearance: Fairly Groomed  Engineer, water::  Good- better related  Speech:  Normal Rate  Volume:  Normal  Mood:  Anxious and Depressed  Affect:  Labile, but more reactive than upon admission  Thought Process:  Goal Directed and Linear  Orientation:  Other:  fully alert and attentive  Thought Content:  denies hallucinations, no delusions  Suicidal Thoughts:  No  Homicidal Thoughts:  No  Memory:  recent and remote grossly intact   Judgement:  Fair  Insight:  Fair  Psychomotor Activity:  Normal  Concentration:  Good  Recall:  Good  Fund of Knowledge:Good  Language: Good  Akathisia:  NA  Handed:  Right  AIMS (if indicated):     Assets:  Communication Skills Desire for Improvement Resilience  Sleep:  Number of Hours: 4.25   Musculoskeletal: Strength & Muscle Tone: within normal limits Gait & Station: normal Patient leans: N/A  Current Medications: Current Facility-Administered Medications  Medication Dose Route Frequency Provider Last Rate Last Dose  . acetaminophen (TYLENOL) tablet 650 mg  650 mg Oral Q6H PRN Laverle Hobby, PA-C   650 mg at 05/03/14 2048  . alum & mag hydroxide-simeth (MAALOX/MYLANTA) 200-200-20 MG/5ML suspension 30 mL  30 mL Oral Q4H PRN Laverle Hobby, PA-C      .  chlordiazePOXIDE (LIBRIUM) capsule 25 mg  25 mg Oral Q6H PRN Laverle Hobby, PA-C   25 mg at 05/04/14 1034  . chlordiazePOXIDE (LIBRIUM) capsule 25 mg  25 mg Oral TID Laverle Hobby, PA-C   25 mg at 05/04/14 0858   Followed by  . [START ON 05/05/2014] chlordiazePOXIDE (LIBRIUM) capsule 25 mg  25 mg  Oral BH-qamhs Spencer E Simon, PA-C       Followed by  . [START ON 05/06/2014] chlordiazePOXIDE (LIBRIUM) capsule 25 mg  25 mg Oral Daily Laverle Hobby, PA-C      . dicyclomine (BENTYL) tablet 20 mg  20 mg Oral Q6H PRN Laverle Hobby, PA-C      . gabapentin (NEURONTIN) capsule 800 mg  800 mg Oral QID Laverle Hobby, PA-C   800 mg at 05/04/14 0857  . hydrOXYzine (ATARAX/VISTARIL) tablet 25 mg  25 mg Oral Q6H PRN Laverle Hobby, PA-C   25 mg at 05/04/14 3220  . ibuprofen (ADVIL,MOTRIN) tablet 800 mg  800 mg Oral TID PRN Laverle Hobby, PA-C   800 mg at 05/04/14 2542  . levothyroxine (SYNTHROID, LEVOTHROID) tablet 50 mcg  50 mcg Oral QAC breakfast Laverle Hobby, PA-C   50 mcg at 05/04/14 7062  . loperamide (IMODIUM) capsule 2-4 mg  2-4 mg Oral PRN Laverle Hobby, PA-C   2 mg at 05/04/14 0900  . magnesium hydroxide (MILK OF MAGNESIA) suspension 30 mL  30 mL Oral Daily PRN Laverle Hobby, PA-C      . methocarbamol (ROBAXIN) tablet 500 mg  500 mg Oral Q8H PRN Laverle Hobby, PA-C   500 mg at 05/04/14 0900  . multivitamin with minerals tablet 1 tablet  1 tablet Oral Daily Laverle Hobby, PA-C   1 tablet at 05/04/14 0857  . nicotine (NICODERM CQ - dosed in mg/24 hours) patch 21 mg  21 mg Transdermal Q0600 Nicholaus Bloom, MD   21 mg at 05/04/14 0800  . ondansetron (ZOFRAN-ODT) disintegrating tablet 4 mg  4 mg Oral Q6H PRN Laverle Hobby, PA-C      . PARoxetine (PAXIL) tablet 40 mg  40 mg Oral Daily Laverle Hobby, PA-C   40 mg at 05/04/14 0857  . thiamine (VITAMIN B-1) tablet 100 mg  100 mg Oral Daily Laverle Hobby, PA-C   100 mg at 05/04/14 3762    Lab Results:  Results for orders placed during the hospital encounter of 05/03/14 (from the past 48 hour(s))  TSH     Status: None   Collection Time    05/03/14  6:55 AM      Result Value Ref Range   TSH 2.640  0.350 - 4.500 uIU/mL   Comment: Performed at [facility] A1C     Status: None   Collection Time    05/03/14   6:55 AM      Result Value Ref Range   Hemoglobin A1C 5.5  <5.7 %   Comment: (NOTE)                                                                               According to the ADA  Clinical Practice Recommendations for 2011, when     HbA1c is used as a screening test:      >=6.5%   Diagnostic of Diabetes Mellitus               (if abnormal result is confirmed)     5.7-6.4%   Increased risk of developing Diabetes Mellitus     References:Diagnosis and Classification of Diabetes Mellitus,Diabetes     ESPQ,3300,76(AUQJF 1):S62-S69 and Standards of Medical Care in             Diabetes - 2011,Diabetes HLKT,6256,38 (Suppl 1):S11-S61.   Mean Plasma Glucose 111  <117 mg/dL   Comment: Performed at [facility]: Abnormal   Collection Time    05/03/14  6:55 AM      Result Value Ref Range   Cholesterol 235 (*) 0 - 200 mg/dL   Triglycerides 477 (*) <150 mg/dL   HDL 59  >39 mg/dL   Total CHOL/HDL Ratio 4.0     VLDL UNABLE TO CALCULATE IF TRIGLYCERIDE OVER 400 mg/dL  0 - 40 mg/dL   LDL Cholesterol UNABLE TO CALCULATE IF TRIGLYCERIDE OVER 400 mg/dL  0 - 99 mg/dL   Comment:            Total Cholesterol/HDL:CHD Risk     Coronary Heart Disease Risk Table                         Men   Women      1/2 Average Risk   3.4   3.3      Average Risk       5.0   4.4      2 X Average Risk   9.6   7.1      3 X Average Risk  23.4   11.0                Use the calculated Patient Ratio     above and the CHD Risk Table     to determine the patient's CHD Risk.                ATP III CLASSIFICATION (LDL):      <100     mg/dL   Optimal      100-129  mg/dL   Near or Above                        Optimal      130-159  mg/dL   Borderline      160-189  mg/dL   High      >190     mg/dL   Very High     Performed at [facility]    Physical Findings: AIMS: Facial and Oral Movements Muscles of Facial Expression: None, normal Lips and Perioral Area: None, normal Jaw: None,  normal Tongue: None, normal,Extremity Movements Upper (arms, wrists, hands, fingers): None, normal Lower (legs, knees, ankles, toes): None, normal, Trunk Movements Neck, shoulders, hips: None, normal, Overall Severity Severity of abnormal movements (highest score from questions above): None, normal Incapacitation due to abnormal movements: None, normal Patient's awareness of abnormal movements (rate only patient's report): No Awareness, Dental Status Current problems with teeth and/or dentures?: No Does patient usually wear dentures?: No  CIWA:  CIWA-Ar Total: 6 COWS:  COWS Total Score: 6  Assessment: Today patient is somewhat less labile and less  irritable than yesterday. She reports chronic back pain, but today more amenable to working on managing pain without narcotics/opiates if possible. Robaxin and Neurontin partially helpful and well tolerated thus far. She is tolerating other medications well. At this time continues to feel somewhat agitated and jittery but is not presenting with any severe/overt alcohol withdrawal symptoms at present. Insomnia an ongoing issue- we discussed options and she is interested in Remeron, which she has taken in the past. We reviewed side effect profiles. Patient ruminative about family issues, but happy that father has agreed to a family meeting.  We discussed medication options  Treatment Plan Summary: Daily contact with patient to assess and evaluate symptoms and progress in treatment Medication management See below  Plan: Continue inpatient treatment- continue to provide milieu and support Continue Librium taper Continue Neurontin 800 mgrs QID Increase Robaxin to 750 mgrs Q 8 hours PRN severe back pain. Start Remeron 7.5 mgrs QHS for ongoing depression and insomnia Increase Paxil to 50 mgrs Daily for anxiety /depression ( We have reviewed medication  side effect profiles and potential for sedation)  Family meeting scheduled for Monday in  AM. Patient encouraged to consider going to an inpatient Rehab after discharge. Nutrition/dietary consult requested  Medical Decision Making Problem Points:  Established problem, stable/improving (1), Review of last therapy session (1) and Review of psycho-social stressors (1) Data Points:  Review or order clinical lab tests (1) Review of medication regiment & side effects (2) Review of new medications or change in dosage (2)  I certify that inpatient services furnished can reasonably be expected to improve the patient's condition.   Amoreena Neubert, [place] 05/04/2014, 11:00 AM

## 2014-05-04 NOTE — Clinical Social Work Note (Signed)
CSW spoke with patient's father who advised they have attempted to work with patient for six years of her alcohol abuse.  He stated the family refuses to continue to enable patient in her addiction and are now using tough love.  He advised the family will not be providing any type of assistance including food to patient.  He shared patient can contact DSS for food assistance and recommends she attempt to get back into [facility] or an [employer].  Patient advised.  She shared she has a 18 year old daughter whom she has not seen in two weeks.  She stated daughter is in a safe environment with her father.

## 2014-05-04 NOTE — Progress Notes (Addendum)
NUTRITION ASSESSMENT  Consult due to high triglycerides and high cholesterol.    INTERVENTION: 1. Educated patient on the importance of nutrition and encouraged intake of food and beverages. 2. Discussed weight goals. 3.  Educated patient on a diet to lower triglyceride.  Handout from the AND provided.    NUTRITION DIAGNOSIS: Unintentional weight loss related to sub-optimal intake as evidenced by pt report.   Goal: Pt to meet >/= 90% of their estimated nutrition needs.  Monitor:  PO intake  Assessment:  Patient admitted with etoh abuse, etoh induced anxiety disorder versus GAD, PTSD, chronic pain.  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  Discussed that etoh can increase triglycerides and importance of sobriety and then rechecking her labs.  Patient discussed knowing that she needs to change many things in her life.  (Triglicerides of 503, cholesterol of 235.)  Patient states that she knows that she needs to lose weight and is frustrated about limiting her activity due to chronic back problems.  Discussed that weight loss could also decrease her cholesterol and triglycerides.  Patient has not been eating well and often skipping meals unless she is cooking for her daughter.  Discussed priority of sobriety and caring for herself.  Then working on other issues for weight loss.  Patient has family who is an RD and gave [facility] as well.  Weight has remained stable for the past 5 months.  38 y.o. female  Height: Ht Readings from Last 1 Encounters:  05/03/14 5\' 6"  (1.676 m)    Weight: Wt Readings from Last 1 Encounters:  05/03/14 318 lb (144.244 kg)    Weight Hx: Wt Readings from Last 10 Encounters:  05/03/14 318 lb (144.244 kg)  04/16/14 320 lb (145.151 kg)  04/03/14 320 lb (145.151 kg)  02/19/14 321 lb (145.605 kg)  02/14/14 318 lb (144.244 kg)  01/26/14 320 lb (145.151 kg)  01/17/14 320 lb  (145.151 kg)  12/07/13 316 lb (143.337 kg)  10/09/13 305 lb (138.347 kg)  10/08/13 303 lb (137.44 kg)    BMI:  Body mass index is 51.35 kg/(m^2). Pt meets criteria for extreme obesity.   based on current BMI.  Estimated Nutritional Needs: Kcal: 25-30 kcal/kg Protein: > 1 gram protein/kg Fluid: 1 ml/kcal  Diet Order: General Pt is also offered choice of unit snacks mid-morning and mid-afternoon.  Pt is eating as desired.   Lab results and medications reviewed.   Antonieta Iba, RD, LDN Clinical Inpatient Dietitian Pager:  [phone] Weekend and after hours pager:  [phone]

## 2014-05-05 MED ORDER — CELECOXIB 100 MG PO CAPS
200.0000 mg | ORAL_CAPSULE | Freq: Two times a day (BID) | ORAL | Status: DC
  Administered 2014-05-05 – 2014-05-07 (×4): 200 mg via ORAL
  Filled 2014-05-05 (×2): qty 2
  Filled 2014-05-05: qty 1
  Filled 2014-05-05: qty 28
  Filled 2014-05-05: qty 2
  Filled 2014-05-05: qty 1
  Filled 2014-05-05 (×3): qty 2
  Filled 2014-05-05: qty 28

## 2014-05-05 NOTE — Progress Notes (Signed)
Reviewed information above and agree with treatment plan except where it is noted.  Ursula Alert ,MD Attending [facility]

## 2014-05-05 NOTE — Progress Notes (Signed)
Patient ID: [name], female   DOB: 04/09/1976, 38 y.o.   MRN: [id number] D)  Has been out and about on the unit this evening, loud at times, laughing with peers, but came to the med window then asking what she had available, and verifying librium taper.   Attended group and participated, was insightful and appropriate, stated she knows what she needs to do and knows she just needs to do it, that she has been through this before.  Has been pleasant and cooperative, requested and was medicated for various c/o's related to w/d.   A)  Will continue to offer support, encouragement, continue POC, and to monitor for safety. R)   Receptive, appreciative, safety maintained.

## 2014-05-05 NOTE — BHH Group Notes (Signed)
[facility] Group Notes:  (Clinical Social Work)  05/05/2014     10-11AM  Summary of Progress/Problems:   The main focus of today's process group was to learn how to use a decisional balance exercise to move forward in the Stages of Change, which were described and discussed.  Motivational Interviewing and a worksheet were utilized to help patients explore in depth the perceived benefits and costs of a self-sabotaging behavior, as well as the  benefits and costs of replacing that with a healthy coping mechanism.   The patient expressed that she is in the hospital to detox from alcohol, and that she has anxiety issues.  She was able to talk about how she uses alcohol to mask her anxiety.  She participated heavily in the discussion, was a leader of the group, and was insightful.  She feels she has made a decision to change and is in the Preparation Stage of Change.  Type of Therapy:  Group Therapy - Process   Participation Level:  Active  Participation Quality:  Appropriate, Attentive, Sharing and Supportive  Affect:  Appropriate  Cognitive:  Alert, Appropriate and Oriented  Insight:  Engaged  Engagement in Therapy:  Engaged  Modes of Intervention:  Education, Motivational Interviewing  Selmer Dominion, LCSW 05/05/2014, 12:05 PM

## 2014-05-05 NOTE — Progress Notes (Signed)
Patient ID: [name], female   DOB: [birth year], 38 y.o.   MRN: [id number] D: Patient is cooperative and calm.  She reports decreased depressive symptoms and SI.  Her main concern is back pain today.  She also reports withdrawal symptoms as tremors, diarrhea, agitation, chills and body aches. She rates her depression as a 3; hopelessness as a 2; anxiety as a 10. Patient denies any HI/AVH.  She is pleasant with staff.  She now has her CPAP so she hopes this will help with her snoring, as her roommate became very upset with her last night.   A: Continue to monitor medication management and MD orders.  Safety checks completed every 15 minutes per protocol.  Encourage and support patient as needed. R: Patient is receptive to staff; her behavior is appropriate.

## 2014-05-05 NOTE — Progress Notes (Signed)
Patient ID: [name], female   DOB: 1976-03-27, 38 y.o.   MRN: [id number] [facility] MD Progress Note  05/05/2014 2:46 PM [name]  MRN:  [id number] Subjective:  Met with the patient who reports that she continues to have back pain but she is feeling much better today. She minimizes her withdrawal symptoms and states she does not want to leave the hospital before her anxiety is under control. She is sleeping well and attending groups and has no new complaints.     Objective: Patient is minimizing her symptoms but is well motivated to continue detox and pursue her sobriety. She has good insight and on further questioning has diaphoresis, shakes, and increased anxiety. .  States her depression is a 2/10, her anxiety isa 7/10 and her pain is an 8/10. She does not appear to be in any distress.  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  Respiratory: Negative for cough.   Cardiovascular: Negative for chest pain.  Musculoskeletal: Positive for back pain.  Skin: Negative for rash.  Psychiatric/Behavioral: Positive for depression and substance abuse. Negative for hallucinations. The patient is nervous/anxious.     Blood pressure 118/78, pulse 92, temperature 97.4 F (36.3 C), temperature source Oral, resp. rate 16, height _0  (1.676 m), weight 144.244 kg (318 lb), last menstrual period 04/26/2014, SpO2 99.00%.Body mass index is 51.35 kg/(m^2).  General Appearance: Fairly Groomed  Engineer, water::  Good- better related  Speech:  Normal Rate  Volume:  Normal  Mood:  Anxious and  Depressed  Affect:  Labile, but more reactive than upon admission  Thought Process:  Goal Directed and Linear  Orientation:  Other:  fully alert and attentive  Thought Content:  denies hallucinations, no delusions  Suicidal Thoughts:  No  Homicidal Thoughts:  No  Memory:  recent and remote grossly intact   Judgement:  Fair  Insight:  Fair  Psychomotor Activity:  Normal  Concentration:  Good  Recall:  Good  Fund of Knowledge:Good  Language: Good  Akathisia:  NA  Handed:  Right  AIMS (if indicated):     Assets:  Communication Skills Desire for Improvement Resilience  Sleep:  Number of Hours: 4.75   Musculoskeletal: Strength & Muscle Tone: within normal limits Gait & Station: normal Patient leans: N/A  Current Medications: Current Facility-Administered Medications  Medication Dose Route Frequency Provider Last Rate Last Dose  . acetaminophen (TYLENOL) tablet 650 mg  650 mg Oral Q6H PRN Laverle Hobby, PA-C   650 mg at 05/03/14 2048  . alum & mag hydroxide-simeth (MAALOX/MYLANTA) 200-200-20 MG/5ML suspension 30 mL  30 mL Oral Q4H PRN Laverle Hobby, PA-C      . chlordiazePOXIDE (LIBRIUM) capsule 25 mg  25 mg Oral Q6H PRN Laverle Hobby, PA-C   25 mg at 05/05/14 1156  . chlordiazePOXIDE (LIBRIUM) capsule 25 mg  25 mg Oral BH-qamhs Laverle Hobby, PA-C   25 mg at 05/05/14 1740   Followed by  . [START ON 05/06/2014] chlordiazePOXIDE (LIBRIUM) capsule 25 mg  25 mg Oral Daily Laverle Hobby, PA-C      .  dicyclomine (BENTYL) tablet 20 mg  20 mg Oral Q6H PRN Laverle Hobby, PA-C      . gabapentin (NEURONTIN) capsule 800 mg  800 mg Oral QID Laverle Hobby, PA-C   800 mg at 05/05/14 1154  . hydrOXYzine (ATARAX/VISTARIL) tablet 25 mg  25 mg Oral Q6H PRN Laverle Hobby, PA-C   25 mg at 05/04/14 2123  . ibuprofen (ADVIL,MOTRIN) tablet 800 mg  800 mg Oral TID PRN Laverle Hobby, PA-C   800 mg at 05/05/14 1445  . levothyroxine (SYNTHROID, LEVOTHROID) tablet 50 mcg  50 mcg Oral QAC  breakfast Laverle Hobby, PA-C   50 mcg at 05/05/14 2774  . loperamide (IMODIUM) capsule 2-4 mg  2-4 mg Oral PRN Laverle Hobby, PA-C   2 mg at 05/04/14 2125  . magnesium hydroxide (MILK OF MAGNESIA) suspension 30 mL  30 mL Oral Daily PRN Laverle Hobby, PA-C      . methocarbamol (ROBAXIN) tablet 750 mg  750 mg Oral Q8H PRN Neita Garnet, MD   750 mg at 05/05/14 1287  . mirtazapine (REMERON) tablet 7.5 mg  7.5 mg Oral QHS Neita Garnet, MD   7.5 mg at 05/04/14 2229  . multivitamin with minerals tablet 1 tablet  1 tablet Oral Daily Laverle Hobby, PA-C   1 tablet at 05/05/14 8676  . nicotine (NICODERM CQ - dosed in mg/24 hours) patch 21 mg  21 mg Transdermal Q0600 Nicholaus Bloom, MD   21 mg at 05/05/14 0641  . ondansetron (ZOFRAN-ODT) disintegrating tablet 4 mg  4 mg Oral Q6H PRN Laverle Hobby, PA-C      . PARoxetine (PAXIL) tablet 50 mg  50 mg Oral Daily Neita Garnet, MD   50 mg at 05/05/14 7209  . thiamine (VITAMIN B-1) tablet 100 mg  100 mg Oral Daily Laverle Hobby, PA-C   100 mg at 05/05/14 4709    Lab Results:  No results found for this or any previous visit (from the past 48 hour(s)).  Physical Findings: AIMS: Facial and Oral Movements Muscles of Facial Expression: None, normal Lips and Perioral Area: None, normal Jaw: None, normal Tongue: None, normal,Extremity Movements Upper (arms, wrists, hands, fingers): None, normal Lower (legs, knees, ankles, toes): None, normal, Trunk Movements Neck, shoulders, hips: None, normal, Overall Severity Severity of abnormal movements (highest score from questions above): None, normal Incapacitation due to abnormal movements: None, normal Patient's awareness of abnormal movements (rate only patient's report): No Awareness, Dental Status Current problems with teeth and/or dentures?: No Does patient usually wear dentures?: No  CIWA:  CIWA-Ar Total: 3 COWS:  COWS Total Score: 7  Assessment: Today patient is somewhat less labile and less  irritable than yesterday. She reports chronic back pain, but today more amenable to working on managing pain without narcotics/opiates if possible. Robaxin and Neurontin partially helpful and well tolerated thus far. She is tolerating other medications well. At this time continues to feel somewhat agitated and jittery but is not presenting with any severe/overt alcohol withdrawal symptoms at present. Insomnia an ongoing issue- we discussed options and she is interested in Remeron, which she has taken in the past. We reviewed side effect profiles. Patient ruminative about family issues, but happy that father has agreed to a family meeting.  We discussed medication options  Treatment Plan Summary: Daily contact with patient to assess and evaluate symptoms and progress in treatment Medication management See below  Plan: Will try Celebrex for back pain. 1.  Continue crisis management and stabilization. 2. Medication management to reduce current symptoms to base line and improve patient's overall level of functioning 3. Treat health problems as indicated. 4. Develop treatment plan to decrease risk of relapse upon discharge and the need for     readmission. 5. Psycho-social education regarding relapse prevention and self care. 6. Health care follow up as needed for medical problems. 7. Continue home medications where appropriate.  Medical Decision Making Problem Points:  Established problem, stable/improving (1), Review of last therapy session (1) and Review of psycho-social stressors (1) Data Points:  Review or order clinical lab tests (1) Review of medication regiment & side effects (2) Review of new medications or change in dosage (2)  I certify that inpatient services furnished can reasonably be expected to improve the patient's condition.  Marlane Hatcher. Sabryna Lahm RPAC 3:00 PM 05/05/2014

## 2014-05-06 MED ORDER — HYDROXYZINE HCL 25 MG PO TABS
25.0000 mg | ORAL_TABLET | Freq: Four times a day (QID) | ORAL | Status: DC | PRN
  Administered 2014-05-06 – 2014-05-07 (×2): 25 mg via ORAL
  Filled 2014-05-06 (×2): qty 1
  Filled 2014-05-06: qty 10

## 2014-05-06 MED ORDER — CHLORDIAZEPOXIDE HCL 25 MG PO CAPS
25.0000 mg | ORAL_CAPSULE | Freq: Once | ORAL | Status: AC
  Administered 2014-05-06: 25 mg via ORAL
  Filled 2014-05-06: qty 1

## 2014-05-06 NOTE — Progress Notes (Signed)
[place] Group Notes:  (Nursing/MHT/Case Management/Adjunct)  Date:  05/06/2014  Time:  11:50 PM  Type of Therapy:  Psychoeducational Skills  Participation Level:  Active  Participation Quality:  Appropriate  Affect:  Appropriate  Cognitive:  Appropriate  Insight:  Good  Engagement in Group:  Improving  Modes of Intervention:  Education  Summary of Progress/Problems: The patient attended the A. A. Meeting this evening and was appropriate. The patient spoke up at one point and talked about how she had been doing well for a year and then relapsed after she began to neglect the A.A. Meetings. She states that it took only a short period of time before she became fully involved with her drug use.   Sahir Tolson S 05/06/2014, 11:50 PM

## 2014-05-06 NOTE — Progress Notes (Signed)
Patient ID: [name], female   DOB: Jan 26, 1976, 38 y.o.   MRN: [id number] D)  Has been participating more in the milieu this evening, affect seems brighter, had visit from a friend that went well.  Attended group this evening,  Has been pleasant and cooperative, interacting appropriately with staff and peers, stated is feeling a little better, but anxiety remains main issue, as well as her back..  Has cpap for hs, conscious of snoring at night, voiced concern about roommate becoming angry again. A)   Routine safety checks q 15 minutes, support and encouragemnt offered, will continue POC R)  Appreciative, safety maintained.

## 2014-05-06 NOTE — [facility] Group Notes (Signed)
[place] Group Notes:  (Nursing/MHT/Case Management/Adjunct)  Date:  05/06/2014  Time:  11:36 AM  Type of Therapy:  Nurse Education  Participation Level:  Active  Participation Quality:  Attentive  Affect:  Blunted  Cognitive:  Appropriate  Insight:  Appropriate  Engagement in Group:  Engaged  Modes of Intervention:  Activity, Discussion, Education and Exploration  Summary of Progress/Problems: The purpose of this group is for self inventory. Patient's are taught through breathing techniques, guided imagery, and choosing three support systems either people or ideas. Patient participated in guided imagery and relaxed breathing. Patient labeled her three support systems as her best friends, God, and going to meetings. Patient stated that she has learned to always get back hope.   [name] E 05/06/2014, 11:36 AM

## 2014-05-06 NOTE — [facility] Group Notes (Signed)
[place] Group Notes:  (Clinical Social Work)  05/06/2014  10:00-11:00AM  Summary of Progress/Problems:   The main focus of today's process group was to   1)  discuss the importance of adding supports  2)  define health supports versus unhealthy supports  3)  identify the patient's current unhealthy supports and plan how to handle them  4)  Identify the patient's current healthy supports and plan what to add.  An emphasis was placed on using counselor, doctor, therapy groups, 12-step groups, and problem-specific support groups to expand supports.    The patient expressed full comprehension of the concepts presented, and agreed that there is a need to add more supports.  The patient stated her current healthy supports are her sponsor, her AA/NA network, her best friends as well as some other friends, God, herself and her brother.  She wants to add a therapist, a church family, and enlarge her AA/NA network.  She was supportive of other people throughout group and continues to show insight that is growing.  Type of Therapy:  Process Group with Motivational Interviewing  Participation Level:  Active  Participation Quality:  Appropriate, Attentive, Sharing and Supportive  Affect:  Appropriate and laughing  Cognitive:  Alert, Appropriate and Oriented  Insight:  Engaged  Engagement in Therapy:  Engaged  Modes of Intervention:   Education, Support and Processing, Activity  [employer], LCSW 05/06/2014, 12:15pm

## 2014-05-06 NOTE — Progress Notes (Signed)
Psychoeducational Group Note  Date:  05/06/2014 Time:  1315 Group Topic/Focus:  Making Healthy Choices:   The focus of this group is to help patients identify negative/unhealthy choices they were using prior to admission and identify positive/healthier coping strategies to replace them upon discharge.  Participation Level:  Active  Participation Quality:  Appropriate  Affect:  Flat  Cognitive:  Oriented  Insight:  Improving  Engagement in Group:  Engaged  Additional Comments:  Patient participated fully in the group and worked on the packet  Bryson Dames A 05/06/2014

## 2014-05-06 NOTE — Progress Notes (Signed)
Patient ID: [name] Glassman Trang, female   DOB: 03-04-1976, 38 y.o.   MRN: [id number] Patient ID: [name] Glassman Goetzinger, female   DOB: [birth year], 38 y.o.   MRN: [id number] [facility] MD Progress Note  05/06/2014 8:47 AM [name]  MRN:  [id number] Subjective:  [name] Glassman is doing well today. "Everything is better today!" She reports her WD symptoms have decreased. She reports some diarrhea but is otherwise doing well .She is anxious about the family meeting tomorrow. The Celebrex seems to have helped.  Objective: Patient has had decreased sleep due to her roommate's irritability, her CPAP an snoring. She a good appetite, rates her depression as 1/10 and her anxiety is a 4/10. Her pain is reported as better at a 7/10.    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  Respiratory: Negative for cough.   Cardiovascular: Negative for chest pain.  Musculoskeletal: Positive for back pain.  Skin: Negative for rash.  Psychiatric/Behavioral: Positive for depression and substance abuse. Negative for hallucinations. The patient is nervous/anxious.     Blood pressure 118/78, pulse 92, temperature 97.4 F (36.3 C), temperature source Oral, resp. rate 16, height 5\' 6"  (1.676 m), weight 144.244 kg (318 lb), last menstrual period 04/26/2014, SpO2 99.00%.Body mass index is 51.35 kg/(m^2).  General Appearance: Fairly Groomed  Engineer, water::  Good- better related  Speech:  Normal Rate  Volume:  Normal  Mood:  Anxious and Depressed  Affect:  Labile, but more reactive than upon admission  Thought  Process:  Goal Directed and Linear  Orientation:  Other:  fully alert and attentive  Thought Content:  denies hallucinations, no delusions  Suicidal Thoughts:  No  Homicidal Thoughts:  No  Memory:  recent and remote grossly intact   Judgement:  Fair  Insight:  Fair  Psychomotor Activity:  Normal  Concentration:  Good  Recall:  Good  Fund of Knowledge:Good  Language: Good  Akathisia:  NA  Handed:  Right  AIMS (if indicated):     Assets:  Communication Skills Desire for Improvement Resilience  Sleep:  Number of Hours: 5.75   Musculoskeletal: Strength & Muscle Tone: within normal limits Gait & Station: normal Patient leans: N/A  Current Medications: Current Facility-Administered Medications  Medication Dose Route Frequency Provider Last Rate Last Dose  . acetaminophen (TYLENOL) tablet 650 mg  650 mg Oral Q6H PRN Laverle Hobby, PA-C   650 mg at 05/03/14 2048  . alum & mag hydroxide-simeth (MAALOX/MYLANTA) 200-200-20 MG/5ML suspension 30 mL  30 mL Oral Q4H PRN Laverle Hobby, PA-C      . chlordiazePOXIDE (LIBRIUM) capsule 25 mg  25 mg Oral Q6H PRN Laverle Hobby, PA-C   25 mg at 05/05/14 1156  . chlordiazePOXIDE (LIBRIUM) capsule 25 mg  25 mg Oral BH-qamhs Laverle Hobby, PA-C   25 mg at 05/05/14 4259   Followed by  . [START ON 05/06/2014] chlordiazePOXIDE (LIBRIUM) capsule 25 mg  25 mg Oral Daily Laverle Hobby, PA-C      . dicyclomine (BENTYL) tablet 20 mg  20 mg Oral Q6H PRN Laverle Hobby, PA-C      .  gabapentin (NEURONTIN) capsule 800 mg  800 mg Oral QID Laverle Hobby, PA-C   800 mg at 05/05/14 1154  . hydrOXYzine (ATARAX/VISTARIL) tablet 25 mg  25 mg Oral Q6H PRN Laverle Hobby, PA-C   25 mg at 05/04/14 2123  . ibuprofen (ADVIL,MOTRIN) tablet 800 mg  800 mg Oral TID PRN Laverle Hobby, PA-C   800 mg at 05/05/14 1445  . levothyroxine (SYNTHROID, LEVOTHROID) tablet 50 mcg  50 mcg Oral QAC breakfast Laverle Hobby, PA-C   50 mcg at 05/05/14 3419  . loperamide  (IMODIUM) capsule 2-4 mg  2-4 mg Oral PRN Laverle Hobby, PA-C   2 mg at 05/04/14 2125  . magnesium hydroxide (MILK OF MAGNESIA) suspension 30 mL  30 mL Oral Daily PRN Laverle Hobby, PA-C      . methocarbamol (ROBAXIN) tablet 750 mg  750 mg Oral Q8H PRN Neita Garnet, MD   750 mg at 05/05/14 3790  . mirtazapine (REMERON) tablet 7.5 mg  7.5 mg Oral QHS Neita Garnet, MD   7.5 mg at 05/04/14 2229  . multivitamin with minerals tablet 1 tablet  1 tablet Oral Daily Laverle Hobby, PA-C   1 tablet at 05/05/14 2409  . nicotine (NICODERM CQ - dosed in mg/24 hours) patch 21 mg  21 mg Transdermal Q0600 Nicholaus Bloom, MD   21 mg at 05/05/14 0641  . ondansetron (ZOFRAN-ODT) disintegrating tablet 4 mg  4 mg Oral Q6H PRN Laverle Hobby, PA-C      . PARoxetine (PAXIL) tablet 50 mg  50 mg Oral Daily Neita Garnet, MD   50 mg at 05/05/14 7353  . thiamine (VITAMIN B-1) tablet 100 mg  100 mg Oral Daily Laverle Hobby, PA-C   100 mg at 05/05/14 2992    Lab Results:  No results found for this or any previous visit (from the past 48 hour(s)).  Physical Findings: AIMS: Facial and Oral Movements Muscles of Facial Expression: None, normal Lips and Perioral Area: None, normal Jaw: None, normal Tongue: None, normal,Extremity Movements Upper (arms, wrists, hands, fingers): None, normal Lower (legs, knees, ankles, toes): None, normal, Trunk Movements Neck, shoulders, hips: None, normal, Overall Severity Severity of abnormal movements (highest score from questions above): None, normal Incapacitation due to abnormal movements: None, normal Patient's awareness of abnormal movements (rate only patient's report): No Awareness, Dental Status Current problems with teeth and/or dentures?: No Does patient usually wear dentures?: No  CIWA:  CIWA-Ar Total: 4 COWS:  COWS Total Score: 4  Assessment: Today patient is somewhat less labile and less irritable than yesterday. She reports chronic back pain, but today more  amenable to working on managing pain without narcotics/opiates if possible. Robaxin and Neurontin partially helpful and well tolerated thus far. She is tolerating other medications well. At this time continues to feel somewhat agitated and jittery but is not presenting with any severe/overt alcohol withdrawal symptoms at present. Insomnia an ongoing issue- we discussed options and she is interested in Remeron, which she has taken in the past. We reviewed side effect profiles. Patient ruminative about family issues, but happy that father has agreed to a family meeting.  We discussed medication options  Treatment Plan Summary: Daily contact with patient to assess and evaluate symptoms and progress in treatment Medication management See below  Plan: Will try Celebrex for back pain. 1. Continue crisis management and stabilization. 2. Medication management to reduce current symptoms to base line and improve patient's overall level of  functioning 3. Treat health problems as indicated. 4. Develop treatment plan to decrease risk of relapse upon discharge and the need for     readmission. 5. Psycho-social education regarding relapse prevention and self care. 6. Health care follow up as needed for medical problems. 7. Continue home medications where appropriate.  Medical Decision Making Problem Points:  Established problem, stable/improving (1), Review of last therapy session (1) and Review of psycho-social stressors (1) Data Points:  Review or order clinical lab tests (1) Review of medication regiment & side effects (2) Review of new medications or change in dosage (2)  I certify that inpatient services furnished can reasonably be expected to improve the patient's condition.  Marlane Hatcher. Priscilla Kirstein RPAC 8:47 AM 05/06/2014

## 2014-05-06 NOTE — Progress Notes (Signed)
[place] Group Notes:  (Nursing/MHT/Case Management/Adjunct)  Date:  05/06/2014  Time:  12:06 AM  Type of Therapy:  Psychoeducational Skills  Participation Level:  Active  Participation Quality:  Appropriate  Affect:  Appropriate  Cognitive:  Appropriate  Insight:  Improving  Engagement in Group:  Improving  Modes of Intervention:  Education  Summary of Progress/Problems: The patient shared in group this evening that she had a good [place] as a whole. The patient states that she had fewer withdrawal symptoms today. She also mentioned that she had a good visit with her friend this evening. As a goal for tomorrow, she intends to work through the steps that she learned in her groups today.   Earnstine Meinders S 05/06/2014, 12:06 AM

## 2014-05-06 NOTE — Progress Notes (Signed)
Reviewed information above and agree with treatment plan except where it is noted.  Ursula Alert ,MD Attending [facility]

## 2014-05-06 NOTE — Progress Notes (Signed)
[name] is seen out in the milieu...UAL on the 300 hall and she tolerates this well. She is pleasant and cooperative. She  Takes her meds as scheduled and she  Is engaged in her recovery as evidenced by her participation in groups, her willingness to do her workbook and she asks appropriate questions and processes with her nurses.   A She completed her morning assessment sheet  And on it she wrote she denied  SI within the past 24 hrs, she rates her depression, hopelessness and anxiety " 2/1/5" and she says she knows what she has to do to work the program.   R Discussed  Healthy coping skills with her and offered positive feedback.

## 2014-05-07 DIAGNOSIS — F39 Unspecified mood [affective] disorder: Secondary | ICD-10-CM

## 2014-05-07 DIAGNOSIS — F1994 Other psychoactive substance use, unspecified with psychoactive substance-induced mood disorder: Secondary | ICD-10-CM

## 2014-05-07 MED ORDER — LEVOTHYROXINE SODIUM 50 MCG PO TABS: 50.0000 ug | ORAL_TABLET | Freq: Every day | ORAL | Status: DC

## 2014-05-07 MED ORDER — PAROXETINE HCL 10 MG PO TABS: 50.0000 mg | ORAL_TABLET | Freq: Every day | ORAL | Status: DC

## 2014-05-07 MED ORDER — CELECOXIB 200 MG PO CAPS: 200.0000 mg | ORAL_CAPSULE | Freq: Two times a day (BID) | ORAL | Status: DC

## 2014-05-07 MED ORDER — METHOCARBAMOL 750 MG PO TABS: 750.0000 mg | ORAL_TABLET | Freq: Three times a day (TID) | ORAL | Status: DC | PRN

## 2014-05-07 MED ORDER — GABAPENTIN 400 MG PO CAPS: 800.0000 mg | ORAL_CAPSULE | Freq: Four times a day (QID) | ORAL | Status: DC

## 2014-05-07 MED ORDER — HYDROXYZINE HCL 25 MG PO TABS: 25.0000 mg | ORAL_TABLET | Freq: Four times a day (QID) | ORAL | Status: DC | PRN

## 2014-05-07 MED ORDER — MIRTAZAPINE 7.5 MG PO TABS: 7.5000 mg | ORAL_TABLET | Freq: Every day | ORAL | Status: DC

## 2014-05-07 MED ORDER — GABAPENTIN 800 MG PO TABS
800.0000 mg | ORAL_TABLET | Freq: Four times a day (QID) | ORAL | Status: DC
  Filled 2014-05-07 (×3): qty 28

## 2014-05-07 NOTE — Progress Notes (Signed)
[facility] Adult Case Management Discharge Plan :  Will you be returning to the same living situation after discharge: Yes,  Patient is returning to her home. At discharge, do you have transportation home?:Yes,  Patient to arrange transportation home. Do you have the ability to pay for your medications:No.  Patient needs assistance with indigent medications   Release of information consent forms completed and in the chart;  Patient's signature needed at discharge.  Patient to Follow up at: Follow-up Information   Follow up with [facility] On 05/09/2014. (You are scheduled with Trude Mcburney on Wednesday, May 09, 2014 at 10 AM.  Please bring insurance card and arrive 15 minutes early to complete the registration process)    Contact information:   [address], [place]   [address]  [phone]      Patient denies SI/HI:  Patient no longer endorsing SI/HI or other thoughts of self harm.  Safety Planning and Suicide Prevention discussed:.  .Reviewed with all patients during discharge planning group   Kassia Demarinis, Eulas Post 05/07/2014, 2:53 PM

## 2014-05-07 NOTE — Progress Notes (Signed)
Pt was in bed upon first assessment this morning and refused to get up for her 0800 medications. She eventually did come around 0830.  She rated her depression 3 anxiety 6 and denied feeling hopeless on her self-inventory.  She is here for detox and anxiety. She denied any symptoms of withdrawal. She denied any S/H ideation or A/V/H. Her goal today "work on discharge plan by going to meetings and talking with staff" She remained in bed until 1020 when her parents arrived for a family session.  Pt is going to be discharge this afternoon.
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
## 2014-05-07 NOTE — Clinical Social Work Note (Signed)
CSW met briefly with patient, MD and patient's parents.  CSW was called out of the meeting due to other patient concerns.

## 2014-05-07 NOTE — BHH Group Notes (Signed)
[facility] LCSW Aftercare Discharge Planning Group Note   05/07/2014 10:49 AM  Participation Quality:  Did not attend group - patient in bed.   Glendy Barsanti, Eulas Post

## 2014-05-07 NOTE — Progress Notes (Signed)
[facility] Group Notes:  (Nursing/MHT/Case Management/Adjunct)  Date:  05/07/2014  Time:  1:40 PM  Type of Therapy:  Therapeutic Activity  Participation Level:  Did not attend. Pt was in bed sleeping  Activity: Patients participated in jeopardy like game, answering questions relating to wellness in categories such as Fruits, Fitness, Veggies, Exercise, and Food Safety     Clint Bolder 05/07/2014, 1:40 PM
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
## 2014-05-07 NOTE — BHH Group Notes (Signed)
[facility] LCSW Group Therapy          Overcoming Obstacles       1:15 -2:30        05/07/2014       Type of Therapy:  Group Therapy  Participation Level:  Appropriate  Participation Quality:  Appropriate  Affect:  Appropriate, Alert  Cognitive:  Attentive Appropriate  Insight: Developing/Improving Engaged  Engagement in Therapy: Developing/Imprvoing Engaged  Modes of Intervention:  Discussion Exploration  Education Rapport BuildingProblem-Solving Support  Summary of Progress/Problems:  The main focus of today's group was overcoming obstacles.   Patient able to identify appropriate coping skills.   Concha Pyo 05/07/2014

## 2014-05-07 NOTE — Clinical Social Work Note (Signed)
CSW received a call back from [facility] advising patient had terminated services with them a while back and they are not willing to see patient again.  Message left on patient's voice mail to call CSW regarding being seen by the [facility] where she is scheduled to be seen later this week.

## 2014-05-07 NOTE — Progress Notes (Signed)
Pt was discharged home today. She denied any S/I H/I or A/V hallucinations.  She was given f/u appointment, rx, sample medications, and hotline info booklet.  She voiced understanding to all instructions provided.  She was given a two-week supply of nicotine patches and information to f/u. Informed her to remove her current patch if she decided to smoke and she did voice understanding.

## 2014-05-07 NOTE — Progress Notes (Signed)
Patient ID: [name], female   DOB: 1976-04-22, 38 y.o.   MRN: [id number] D)  Has been spending more time in the dayroom and interacting with peers.  Affect is brighter, smiling and regaining her sense of humor , has been appropriate and pleasant this evening.  Attended group, and participated, sharing about her own experience and relapse.  Has been more focused on her goals and moving forward. A)  Will continue to monitor for safetyu, continue POC, support and encouragement R)  Appreciative, receptive, safety maintained.

## 2014-05-09 NOTE — Progress Notes (Signed)
Patient Discharge Instructions:  After Visit Summary (AVS):   Faxed to:  05/09/14 Discharge Summary Note:   Faxed to:  05/09/14 Psychiatric Admission Assessment Note:   Faxed to:  05/09/14 Suicide Risk Assessment - Discharge Assessment:   Faxed to:  05/09/14 Faxed/Sent to the [facility] Care provider:  05/09/14 Faxed to [facility], 05/09/2014, 3:39 PM

## 2014-05-16 ENCOUNTER — Ambulatory Visit (HOSPITAL_COMMUNITY): Payer: Self-pay | Admitting: Psychology

## 2014-05-30 ENCOUNTER — Other Ambulatory Visit: Payer: Self-pay | Admitting: Family Medicine

## 2014-06-05 ENCOUNTER — Telehealth: Payer: Self-pay | Admitting: *Deleted

## 2014-06-05 ENCOUNTER — Ambulatory Visit (HOSPITAL_COMMUNITY): Payer: No Typology Code available for payment source | Admitting: Psychiatry

## 2014-06-05 ENCOUNTER — Telehealth: Payer: Self-pay

## 2014-06-05 ENCOUNTER — Encounter: Payer: Self-pay | Admitting: *Deleted

## 2014-06-05 NOTE — Telephone Encounter (Signed)
Pt states she called her pharmacy for a refill on her GABAPENTIN 400mg s and now is completely out. Please call [facility]

## 2014-06-05 NOTE — Telephone Encounter (Signed)
Call pt to resched apt. Lft VM with new apt date and time. Requested she call in if the apt does not fit her schedule.

## 2014-06-06 ENCOUNTER — Encounter (HOSPITAL_COMMUNITY): Payer: Self-pay | Admitting: Emergency Medicine

## 2014-06-06 ENCOUNTER — Emergency Department (HOSPITAL_COMMUNITY)
Admission: EM | Admit: 2014-06-06 | Discharge: 2014-06-06 | Disposition: A | Discharge status: Home / Self Care | Payer: Medicare Other | Attending: Emergency Medicine | Admitting: Emergency Medicine

## 2014-06-06 DIAGNOSIS — R197 Diarrhea, unspecified: Secondary | ICD-10-CM | POA: Diagnosis not present

## 2014-06-06 DIAGNOSIS — Z880 Allergy status to penicillin: Secondary | ICD-10-CM | POA: Insufficient documentation

## 2014-06-06 DIAGNOSIS — E079 Disorder of thyroid, unspecified: Secondary | ICD-10-CM | POA: Insufficient documentation

## 2014-06-06 DIAGNOSIS — M1990 Unspecified osteoarthritis, unspecified site: Secondary | ICD-10-CM | POA: Insufficient documentation

## 2014-06-06 DIAGNOSIS — G4730 Sleep apnea, unspecified: Secondary | ICD-10-CM | POA: Insufficient documentation

## 2014-06-06 DIAGNOSIS — R1011 Right upper quadrant pain: Secondary | ICD-10-CM | POA: Diagnosis not present

## 2014-06-06 DIAGNOSIS — R112 Nausea with vomiting, unspecified: Secondary | ICD-10-CM | POA: Insufficient documentation

## 2014-06-06 DIAGNOSIS — Z79899 Other long term (current) drug therapy: Secondary | ICD-10-CM | POA: Diagnosis not present

## 2014-06-06 DIAGNOSIS — G8929 Other chronic pain: Secondary | ICD-10-CM | POA: Diagnosis not present

## 2014-06-06 DIAGNOSIS — Z9981 Dependence on supplemental oxygen: Secondary | ICD-10-CM | POA: Diagnosis not present

## 2014-06-06 DIAGNOSIS — Z720 Tobacco use: Secondary | ICD-10-CM | POA: Insufficient documentation

## 2014-06-06 DIAGNOSIS — Z791 Long term (current) use of non-steroidal anti-inflammatories (NSAID): Secondary | ICD-10-CM | POA: Insufficient documentation

## 2014-06-06 DIAGNOSIS — F10929 Alcohol use, unspecified with intoxication, unspecified: Secondary | ICD-10-CM

## 2014-06-06 DIAGNOSIS — G709 Myoneural disorder, unspecified: Secondary | ICD-10-CM | POA: Insufficient documentation

## 2014-06-06 DIAGNOSIS — Z862 Personal history of diseases of the blood and blood-forming organs and certain disorders involving the immune mechanism: Secondary | ICD-10-CM | POA: Insufficient documentation

## 2014-06-06 DIAGNOSIS — F419 Anxiety disorder, unspecified: Secondary | ICD-10-CM | POA: Diagnosis not present

## 2014-06-06 DIAGNOSIS — Z8544 Personal history of malignant neoplasm of other female genital organs: Secondary | ICD-10-CM | POA: Insufficient documentation

## 2014-06-06 DIAGNOSIS — F10220 Alcohol dependence with intoxication, uncomplicated: Secondary | ICD-10-CM | POA: Diagnosis present

## 2014-06-06 LAB — CBC WITH DIFFERENTIAL/PLATELET
BASOS ABS: 0 10*3/uL (ref 0.0–0.1)
BASOS PCT: 0 % (ref 0–1)
Eosinophils Absolute: 0.1 10*3/uL (ref 0.0–0.7)
Eosinophils Relative: 1 % (ref 0–5)
HCT: 43.4 % (ref 36.0–46.0)
Hemoglobin: 15 g/dL (ref 12.0–15.0)
LYMPHS PCT: 32 % (ref 12–46)
Lymphs Abs: 2.9 10*3/uL (ref 0.7–4.0)
MCH: 32.7 pg (ref 26.0–34.0)
MCHC: 34.6 g/dL (ref 30.0–36.0)
MCV: 94.6 fL (ref 78.0–100.0)
MONO ABS: 0.6 10*3/uL (ref 0.1–1.0)
Monocytes Relative: 6 % (ref 3–12)
NEUTROS ABS: 5.5 10*3/uL (ref 1.7–7.7)
Neutrophils Relative %: 61 % (ref 43–77)
PLATELETS: 261 10*3/uL (ref 150–400)
RBC: 4.59 MIL/uL (ref 3.87–5.11)
RDW: 13.9 % (ref 11.5–15.5)
WBC: 9 10*3/uL (ref 4.0–10.5)

## 2014-06-06 LAB — RAPID URINE DRUG SCREEN, HOSP PERFORMED
Amphetamines: NOT DETECTED
BARBITURATES: NOT DETECTED
Benzodiazepines: NOT DETECTED
Cocaine: NOT DETECTED
Opiates: NOT DETECTED
TETRAHYDROCANNABINOL: NOT DETECTED

## 2014-06-06 LAB — URINALYSIS, ROUTINE W REFLEX MICROSCOPIC
Bilirubin Urine: NEGATIVE
Glucose, UA: NEGATIVE mg/dL
Hgb urine dipstick: NEGATIVE
Ketones, ur: NEGATIVE mg/dL
LEUKOCYTES UA: NEGATIVE
Nitrite: NEGATIVE
PROTEIN: NEGATIVE mg/dL
Specific Gravity, Urine: 1.02 (ref 1.005–1.030)
UROBILINOGEN UA: 0.2 mg/dL (ref 0.0–1.0)
pH: 5.5 (ref 5.0–8.0)

## 2014-06-06 LAB — LIPASE, BLOOD: LIPASE: 21 U/L (ref 11–59)

## 2014-06-06 LAB — COMPREHENSIVE METABOLIC PANEL
ALBUMIN: 3.6 g/dL (ref 3.5–5.2)
ALT: 36 U/L — ABNORMAL HIGH (ref 0–35)
ANION GAP: 18 — AB (ref 5–15)
AST: 52 U/L — AB (ref 0–37)
Alkaline Phosphatase: 58 U/L (ref 39–117)
BUN: 9 mg/dL (ref 6–23)
CHLORIDE: 102 meq/L (ref 96–112)
CO2: 21 mEq/L (ref 19–32)
Calcium: 9 mg/dL (ref 8.4–10.5)
Creatinine, Ser: 0.69 mg/dL (ref 0.50–1.10)
GFR calc Af Amer: >90 mL/min (ref >90)
GFR calc non Af Amer: >90 mL/min (ref >90)
Glucose, Bld: 84 mg/dL (ref 70–99)
Potassium: 4.8 mEq/L (ref 3.7–5.3)
Sodium: 141 mEq/L (ref 137–147)
TOTAL PROTEIN: 7.8 g/dL (ref 6.0–8.3)

## 2014-06-06 LAB — ETHANOL: ALCOHOL ETHYL (B): 296 mg/dL — AB (ref 0–11)

## 2014-06-06 MED ORDER — MORPHINE SULFATE 2 MG/ML IJ SOLN
2.0000 mg | Freq: Once | INTRAMUSCULAR | Status: AC
  Administered 2014-06-06: 2 mg via INTRAVENOUS
  Filled 2014-06-06: qty 1

## 2014-06-06 NOTE — ED Notes (Signed)
Patient provided Bus pass per request. Ambulatory upon d/c in NAD.

## 2014-06-06 NOTE — ED Notes (Signed)
Patient reports "I feel like I'm having a panic attack". Patient is ambulatory in room, speaking in complete sentences. Patient 02 is 97% on RA. A&O x4 at this time.

## 2014-06-06 NOTE — ED Notes (Signed)
Bed: WA18 Expected date:  Expected time:  Means of arrival:  Comments: EMS- ETOH 

## 2014-06-06 NOTE — ED Notes (Signed)
Per patient "I dont know" when asked how much she drank today. Patient estimates that it was half a bottle of vodka.

## 2014-06-06 NOTE — ED Provider Notes (Signed)
CSN: [id number]     Arrival date & time 06/06/14  1903 History   First MD Initiated Contact with Patient 06/06/14 1930     Chief Complaint  Patient presents with  . Alcohol Intoxication     (Consider location/radiation/quality/duration/timing/severity/associated sxs/prior Treatment) HPI Comments: The patient is a 38 year old female with a past medical history of narcotic abuse, hypothyroidism, reflux PCO S presenting to the emergency department the chief complaint of right-sided abdominal pain for 4 days. Patient reports constant right-sided discomfort, sharp, nonradiating. Patient reports worsening discomfort with movement and deep inspirations. Patient reports similar symptoms with previous questionable gallbladder disease. Patient reports multiple episodes of vomiting over the last 4 days. Patient also reports multiple episodes of diarrhea, "a lot" when asked to clarify the patient states "like a lot". Patient denies EtOH use today reports last use last night unknown amount. The patient asking provider to call her PCP to let him know about her pain.  Patient is a 38 y.o. female presenting with intoxication. The history is provided by the patient. No language interpreter was used.  Alcohol Intoxication Associated symptoms include abdominal pain, nausea and vomiting. Pertinent negatives include no chills or fever.    Past Medical History  Diagnosis Date  . Narcotic abuse in remission   . Thyroid disease   . Chronic back pain   . Hypothyroidism   . Anxiety   . GERD (gastroesophageal reflux disease)   . Sleep apnea     cpap  . Headache(784.0)   . Neuromuscular disorder     neuropathy  . Arthritis     knees bilateral   DJD  stenosis   . Blood dyscrasia     HSV  . PCOS (polycystic ovarian syndrome)   . Cancer     vaginal  malignant carcinoma   Past Surgical History  Procedure Laterality Date  . Back surgery    . Cesarean section    . Tonsillectomy    . Tubal ligation    .  Fracture surgery     Family History  Problem Relation Age of Onset  . Arthritis Mother   . Clotting disorder Mother   . Hyperlipidemia Father   . Leukemia Maternal Grandmother   . Diabetes Maternal Grandfather   . Stroke Paternal Grandmother   . Heart disease Father   . Diabetes Mother   . Hypertension Mother   . Prostate cancer Father   . Heart disease Paternal Grandmother    History  Substance Use Topics  . Smoking status: Current Every [place] Smoker -- 0.50 packs/[place] for 20 years    Types: Cigarettes  . Smokeless tobacco: Never Used  . Alcohol Use: Yes     Comment: heavy 1/2 gallon vodka daily   OB History    No data available     Review of Systems  Constitutional: Negative for fever and chills.  Gastrointestinal: Positive for nausea, vomiting, abdominal pain and diarrhea. Negative for constipation and blood in stool.  Genitourinary: Negative for dysuria, hematuria and vaginal discharge.      Allergies  Penicillins and Quinolones  Home Medications   Prior to Admission medications   Medication Sig Start Date End Date Taking? Authorizing Provider  celecoxib (CELEBREX) 200 MG capsule Take 1 capsule (200 mg total) by mouth 2 (two) times daily. For arthritis pain 05/07/14   [facility], NP  gabapentin (NEURONTIN) 400 MG capsule Take 2 capsules (800 mg total) by mouth 4 (four) times daily. For pain 05/07/14   Freda Munro  Larose Kells, NP  hydrOXYzine (ATARAX/VISTARIL) 25 MG tablet Take 1 tablet (25 mg total) by mouth every 6 (six) hours as needed for anxiety. 05/07/14   Freda Munro May Agustin, NP  levothyroxine (SYNTHROID, LEVOTHROID) 50 MCG tablet TAKE ONE TABLET BY MOUTH ONCE DAILY BEFORE  BREAKFAST 05/31/14   Wendie Agreste, MD  levothyroxine (SYNTHROID, LEVOTHROID) 50 MCG tablet Take 50 mcg by mouth daily before breakfast.    Historical Provider, MD  methocarbamol (ROBAXIN) 750 MG tablet Take 1 tablet (750 mg total) by mouth every 8 (eight) hours as needed for muscle  spasms. For muscle pains 05/07/14   [facility], NP  mirtazapine (REMERON) 7.5 MG tablet Take 1 tablet (7.5 mg total) by mouth at bedtime. For depression 05/07/14   Janett Labella, NP  PARoxetine (PAXIL) 10 MG tablet Take 5 tablets (50 mg total) by mouth daily. For depression 05/07/14   Freda Munro May Agustin, NP   BP 122/64 mmHg  Pulse 90  Temp(Src) 98.1 F (36.7 C) (Oral)  Resp 12  SpO2 96% Physical Exam  Constitutional: She is oriented to person, place, and time. She appears well-developed and well-nourished. No distress.  HENT:  Head: Atraumatic.  Neck: Neck supple.  Cardiovascular: Normal rate and regular rhythm.   Pulmonary/Chest: She has no decreased breath sounds. She has no wheezes. She has no rhonchi. She has no rales.  Abdominal: Soft. There is tenderness in the right upper quadrant. There is no rebound and no guarding.  Neurological: She is alert and oriented to person, place, and time. GCS eye subscore is 4. GCS verbal subscore is 5. GCS motor subscore is 6.  Appears intoxicated  Nursing note and vitals reviewed.   ED Course  Procedures (including critical care time) Labs Review Labs Reviewed  ETHANOL - Abnormal; Notable for the following:    Alcohol, Ethyl (B) 296 (*)    All other components within normal limits  URINALYSIS, ROUTINE W REFLEX MICROSCOPIC - Abnormal; Notable for the following:    APPearance CLOUDY (*)    All other components within normal limits  COMPREHENSIVE METABOLIC PANEL - Abnormal; Notable for the following:    AST 52 (*)    ALT 36 (*)    Total Bilirubin <0.2 (*)    Anion gap 18 (*)    All other components within normal limits  URINE RAPID DRUG SCREEN (HOSP PERFORMED)  CBC WITH DIFFERENTIAL  LIPASE, BLOOD    Imaging Review No results found.   EKG Interpretation None      MDM   Final diagnoses:  Alcoholic intoxication, with unspecified complication  Right upper quadrant pain   Patient presents with right-sided  abdominal pain, appears intoxicated on exam. Labs ordered plan to treat for pain and reevaluate. Patient has history of narcotic abuse and plant to treat in small doses until labs results and re-eval. Labs reassuring. EtOH 296. Reevaluation patient requests he be discharged home. She reports resolution of abdominal pain. She reports drinking one half of a fifth of vodka earlier today. Plan to follow-up with PCP, advised avoiding alcohol future.  Patient is clinically sober able to ambulate without assistance normal mentation.    Harvie Heck, PA-C 06/07/14 [place], MD 06/11/14 1354

## 2014-06-06 NOTE — ED Notes (Signed)
Per ems pt called 911 twice today and hung up/ police found her altered LOC and etoh intoxicated. Per ems pt is combative, verbally aggressive. CBG  103 per ems.

## 2014-06-06 NOTE — Discharge Instructions (Signed)
Call for a follow up appointment with a Family or Primary Care Provider.  Call a Gi specialist for further evaluation for your abdominal discomfort. Return if Symptoms worsen.   Take medication as prescribed.  Avoid drinking alcohol to reduce abdominal discomfort.

## 2014-06-07 ENCOUNTER — Emergency Department (HOSPITAL_COMMUNITY): Payer: Medicare Other

## 2014-06-07 ENCOUNTER — Emergency Department (HOSPITAL_COMMUNITY)
Admission: EM | Admit: 2014-06-07 | Discharge: 2014-06-07 | Disposition: A | Discharge status: Rehabilitation Facility | Payer: Medicare Other | Attending: Emergency Medicine | Admitting: Emergency Medicine

## 2014-06-07 ENCOUNTER — Encounter (HOSPITAL_COMMUNITY): Payer: Self-pay | Admitting: Emergency Medicine

## 2014-06-07 DIAGNOSIS — Z8719 Personal history of other diseases of the digestive system: Secondary | ICD-10-CM | POA: Diagnosis not present

## 2014-06-07 DIAGNOSIS — F332 Major depressive disorder, recurrent severe without psychotic features: Secondary | ICD-10-CM

## 2014-06-07 DIAGNOSIS — G4730 Sleep apnea, unspecified: Secondary | ICD-10-CM | POA: Insufficient documentation

## 2014-06-07 DIAGNOSIS — Z8544 Personal history of malignant neoplasm of other female genital organs: Secondary | ICD-10-CM | POA: Diagnosis not present

## 2014-06-07 DIAGNOSIS — E039 Hypothyroidism, unspecified: Secondary | ICD-10-CM | POA: Diagnosis not present

## 2014-06-07 DIAGNOSIS — F419 Anxiety disorder, unspecified: Secondary | ICD-10-CM | POA: Diagnosis not present

## 2014-06-07 DIAGNOSIS — Z79899 Other long term (current) drug therapy: Secondary | ICD-10-CM | POA: Insufficient documentation

## 2014-06-07 DIAGNOSIS — Z791 Long term (current) use of non-steroidal anti-inflammatories (NSAID): Secondary | ICD-10-CM | POA: Diagnosis not present

## 2014-06-07 DIAGNOSIS — Z9981 Dependence on supplemental oxygen: Secondary | ICD-10-CM | POA: Insufficient documentation

## 2014-06-07 DIAGNOSIS — F1010 Alcohol abuse, uncomplicated: Secondary | ICD-10-CM | POA: Diagnosis not present

## 2014-06-07 DIAGNOSIS — F329 Major depressive disorder, single episode, unspecified: Secondary | ICD-10-CM | POA: Insufficient documentation

## 2014-06-07 DIAGNOSIS — G8929 Other chronic pain: Secondary | ICD-10-CM | POA: Diagnosis not present

## 2014-06-07 DIAGNOSIS — R079 Chest pain, unspecified: Secondary | ICD-10-CM | POA: Insufficient documentation

## 2014-06-07 DIAGNOSIS — Z880 Allergy status to penicillin: Secondary | ICD-10-CM | POA: Insufficient documentation

## 2014-06-07 DIAGNOSIS — R109 Unspecified abdominal pain: Secondary | ICD-10-CM | POA: Insufficient documentation

## 2014-06-07 DIAGNOSIS — Z8739 Personal history of other diseases of the musculoskeletal system and connective tissue: Secondary | ICD-10-CM | POA: Diagnosis not present

## 2014-06-07 DIAGNOSIS — F1994 Other psychoactive substance use, unspecified with psychoactive substance-induced mood disorder: Secondary | ICD-10-CM

## 2014-06-07 DIAGNOSIS — R45851 Suicidal ideations: Secondary | ICD-10-CM

## 2014-06-07 DIAGNOSIS — Z720 Tobacco use: Secondary | ICD-10-CM | POA: Insufficient documentation

## 2014-06-07 DIAGNOSIS — F32A Depression, unspecified: Secondary | ICD-10-CM

## 2014-06-07 DIAGNOSIS — F10239 Alcohol dependence with withdrawal, unspecified: Secondary | ICD-10-CM

## 2014-06-07 DIAGNOSIS — E079 Disorder of thyroid, unspecified: Secondary | ICD-10-CM | POA: Diagnosis not present

## 2014-06-07 DIAGNOSIS — R51 Headache: Secondary | ICD-10-CM | POA: Insufficient documentation

## 2014-06-07 LAB — BASIC METABOLIC PANEL
Anion gap: 19 — ABNORMAL HIGH (ref 5–15)
BUN: 10 mg/dL (ref 6–23)
CO2: 21 meq/L (ref 19–32)
Calcium: 8.5 mg/dL (ref 8.4–10.5)
Chloride: 101 mEq/L (ref 96–112)
Creatinine, Ser: 0.81 mg/dL (ref 0.50–1.10)
GFR calc Af Amer: >90 mL/min (ref >90)
GFR calc non Af Amer: >90 mL/min (ref >90)
GLUCOSE: 89 mg/dL (ref 70–99)
POTASSIUM: 3.9 meq/L (ref 3.7–5.3)
Sodium: 141 mEq/L (ref 137–147)

## 2014-06-07 LAB — CBC
HEMATOCRIT: 40.1 % (ref 36.0–46.0)
HEMOGLOBIN: 13.7 g/dL (ref 12.0–15.0)
MCH: 32.4 pg (ref 26.0–34.0)
MCHC: 34.2 g/dL (ref 30.0–36.0)
MCV: 94.8 fL (ref 78.0–100.0)
Platelets: 253 10*3/uL (ref 150–400)
RBC: 4.23 MIL/uL (ref 3.87–5.11)
RDW: 14 % (ref 11.5–15.5)
WBC: 8.4 10*3/uL (ref 4.0–10.5)

## 2014-06-07 LAB — URINE MICROSCOPIC-ADD ON

## 2014-06-07 LAB — URINALYSIS, ROUTINE W REFLEX MICROSCOPIC
Bilirubin Urine: NEGATIVE
Glucose, UA: NEGATIVE mg/dL
HGB URINE DIPSTICK: NEGATIVE
Ketones, ur: 15 mg/dL — AB
LEUKOCYTES UA: NEGATIVE
NITRITE: NEGATIVE
PROTEIN: 30 mg/dL — AB
Specific Gravity, Urine: 1.031 — ABNORMAL HIGH (ref 1.005–1.030)
UROBILINOGEN UA: 0.2 mg/dL (ref 0.0–1.0)
pH: 5 (ref 5.0–8.0)

## 2014-06-07 LAB — RAPID URINE DRUG SCREEN, HOSP PERFORMED
Amphetamines: NOT DETECTED
Barbiturates: NOT DETECTED
Benzodiazepines: NOT DETECTED
Cocaine: NOT DETECTED
OPIATES: POSITIVE — AB
TETRAHYDROCANNABINOL: NOT DETECTED

## 2014-06-07 LAB — D-DIMER, QUANTITATIVE (NOT AT ARMC): D-Dimer, Quant: <0.27 ug/mL-FEU (ref 0.00–0.48)

## 2014-06-07 LAB — I-STAT TROPONIN, ED: Troponin i, poc: 0.01 ng/mL (ref 0.00–0.08)

## 2014-06-07 LAB — ETHANOL: Alcohol, Ethyl (B): 148 mg/dL — ABNORMAL HIGH (ref 0–11)

## 2014-06-07 MED ORDER — MIRTAZAPINE 7.5 MG PO TABS
7.5000 mg | ORAL_TABLET | Freq: Every day | ORAL | Status: DC
  Filled 2014-06-07: qty 1

## 2014-06-07 MED ORDER — NICOTINE 14 MG/24HR TD PT24
14.0000 mg | MEDICATED_PATCH | Freq: Once | TRANSDERMAL | Status: DC
  Filled 2014-06-07: qty 1

## 2014-06-07 MED ORDER — HYDROXYZINE HCL 25 MG PO TABS
25.0000 mg | ORAL_TABLET | Freq: Four times a day (QID) | ORAL | Status: DC | PRN
  Administered 2014-06-07: 25 mg via ORAL
  Filled 2014-06-07: qty 1

## 2014-06-07 MED ORDER — LEVOTHYROXINE SODIUM 50 MCG PO TABS
50.0000 ug | ORAL_TABLET | Freq: Every day | ORAL | Status: DC
  Administered 2014-06-07: 50 ug via ORAL
  Filled 2014-06-07 (×2): qty 1

## 2014-06-07 MED ORDER — GI COCKTAIL ~~LOC~~
30.0000 mL | Freq: Once | ORAL | Status: AC
  Administered 2014-06-07: 30 mL via ORAL
  Filled 2014-06-07: qty 30

## 2014-06-07 MED ORDER — NICOTINE 21 MG/24HR TD PT24
21.0000 mg | MEDICATED_PATCH | Freq: Once | TRANSDERMAL | Status: DC
  Administered 2014-06-07: 21 mg via TRANSDERMAL
  Filled 2014-06-07: qty 1

## 2014-06-07 MED ORDER — GABAPENTIN 400 MG PO CAPS
800.0000 mg | ORAL_CAPSULE | Freq: Four times a day (QID) | ORAL | Status: DC
  Administered 2014-06-07 (×2): 800 mg via ORAL
  Filled 2014-06-07 (×2): qty 2

## 2014-06-07 MED ORDER — NITROGLYCERIN 0.4 MG SL SUBL
0.4000 mg | SUBLINGUAL_TABLET | SUBLINGUAL | Status: DC | PRN
  Administered 2014-06-07: 0.4 mg via SUBLINGUAL

## 2014-06-07 MED ORDER — ACETAMINOPHEN 325 MG PO TABS
650.0000 mg | ORAL_TABLET | Freq: Four times a day (QID) | ORAL | Status: DC | PRN
  Administered 2014-06-07: 650 mg via ORAL
  Filled 2014-06-07: qty 2

## 2014-06-07 MED ORDER — ONDANSETRON HCL 4 MG/2ML IJ SOLN
4.0000 mg | Freq: Once | INTRAMUSCULAR | Status: AC
  Administered 2014-06-07: 4 mg via INTRAVENOUS
  Filled 2014-06-07: qty 2

## 2014-06-07 MED ORDER — HYDROMORPHONE HCL 1 MG/ML IJ SOLN
1.0000 mg | Freq: Once | INTRAMUSCULAR | Status: AC
Start: 2014-06-07 — End: 2014-06-07
  Administered 2014-06-07: 1 mg via INTRAVENOUS
  Filled 2014-06-07: qty 1

## 2014-06-07 MED ORDER — METHOCARBAMOL 500 MG PO TABS: 750.0000 mg | ORAL_TABLET | Freq: Three times a day (TID) | ORAL | Status: DC | PRN

## 2014-06-07 MED ORDER — CHLORDIAZEPOXIDE HCL 25 MG PO CAPS
25.0000 mg | ORAL_CAPSULE | Freq: Three times a day (TID) | ORAL | Status: DC
  Administered 2014-06-07: 25 mg via ORAL
  Filled 2014-06-07: qty 1

## 2014-06-07 MED ORDER — LORAZEPAM 1 MG PO TABS
0.0000 mg | ORAL_TABLET | Freq: Four times a day (QID) | ORAL | Status: DC
  Administered 2014-06-07: 1 mg via ORAL
  Filled 2014-06-07: qty 1

## 2014-06-07 MED ORDER — LORAZEPAM 1 MG PO TABS: 0.0000 mg | ORAL_TABLET | Freq: Two times a day (BID) | ORAL | Status: DC

## 2014-06-07 MED ORDER — ONDANSETRON HCL 4 MG PO TABS
4.0000 mg | ORAL_TABLET | Freq: Four times a day (QID) | ORAL | Status: DC | PRN
  Administered 2014-06-07: 4 mg via ORAL
  Filled 2014-06-07: qty 1

## 2014-06-07 MED ORDER — PAROXETINE HCL 30 MG PO TABS
50.0000 mg | ORAL_TABLET | Freq: Every day | ORAL | Status: DC
  Administered 2014-06-07: 50 mg via ORAL
  Filled 2014-06-07: qty 1

## 2014-06-07 NOTE — ED Provider Notes (Addendum)
CSN: [id number]     Arrival date & time 06/07/14  0403 History   First MD Initiated Contact with Patient 06/07/14 0405     Chief Complaint  Patient presents with  . Chest Pain     (Consider location/radiation/quality/duration/timing/severity/associated sxs/prior Treatment) HPI Comments: Patient presents to the ER for evaluation of chest pain. She reports a heaviness in her chest which began approximately 3 hours ago. Patient feels short of breath. She reports that she has been to the hospital already once today for a migraine headache. Since she left the ER she has now developed abdominal pain and chest pain.  Patient is a 38 y.o. female presenting with chest pain.  Chest Pain Associated symptoms: abdominal pain and headache     Past Medical History  Diagnosis Date  . Narcotic abuse in remission   . Thyroid disease   . Chronic back pain   . Hypothyroidism   . Anxiety   . GERD (gastroesophageal reflux disease)   . Sleep apnea     cpap  . Headache(784.0)   . Neuromuscular disorder     neuropathy  . Arthritis     knees bilateral   DJD  stenosis   . Blood dyscrasia     HSV  . PCOS (polycystic ovarian syndrome)   . Cancer     vaginal  malignant carcinoma   Past Surgical History  Procedure Laterality Date  . Back surgery    . Cesarean section    . Tonsillectomy    . Tubal ligation    . Fracture surgery     Family History  Problem Relation Age of Onset  . Arthritis Mother   . Clotting disorder Mother   . Hyperlipidemia Father   . Leukemia Maternal Grandmother   . Diabetes Maternal Grandfather   . Stroke Paternal Grandmother   . Heart disease Father   . Diabetes Mother   . Hypertension Mother   . Prostate cancer Father   . Heart disease Paternal Grandmother    History  Substance Use Topics  . Smoking status: Current Every [place] Smoker -- 0.50 packs/[place] for 20 years    Types: Cigarettes  . Smokeless tobacco: Never Used  . Alcohol Use: Yes     Comment: heavy 1/2  gallon vodka daily   OB History    No data available     Review of Systems  Cardiovascular: Positive for chest pain.  Gastrointestinal: Positive for abdominal pain.  Neurological: Positive for headaches.  All other systems reviewed and are negative.     Allergies  Penicillins and Quinolones  Home Medications   Prior to Admission medications   Medication Sig Start Date End Date Taking? Authorizing Provider  celecoxib (CELEBREX) 200 MG capsule Take 1 capsule (200 mg total) by mouth 2 (two) times daily. For arthritis pain 05/07/14  Yes Freda Munro May Agustin, NP  gabapentin (NEURONTIN) 400 MG capsule Take 2 capsules (800 mg total) by mouth 4 (four) times daily. For pain 05/07/14  Yes Freda Munro May Agustin, NP  hydrOXYzine (ATARAX/VISTARIL) 25 MG tablet Take 1 tablet (25 mg total) by mouth every 6 (six) hours as needed for anxiety. 05/07/14  Yes Freda Munro May Agustin, NP  levothyroxine (SYNTHROID, LEVOTHROID) 50 MCG tablet Take 50 mcg by mouth daily before breakfast.   Yes Historical Provider, MD  methocarbamol (ROBAXIN) 750 MG tablet Take 1 tablet (750 mg total) by mouth every 8 (eight) hours as needed for muscle spasms. For muscle pains 05/07/14  Yes Freda Munro May Agustin,  NP  mirtazapine (REMERON) 7.5 MG tablet Take 1 tablet (7.5 mg total) by mouth at bedtime. For depression 05/07/14  Yes Freda Munro May Agustin, NP  PARoxetine (PAXIL) 40 MG tablet Take 40 mg by mouth every morning.   Yes Historical Provider, MD  levothyroxine (SYNTHROID, LEVOTHROID) 50 MCG tablet TAKE ONE TABLET BY MOUTH ONCE DAILY BEFORE  BREAKFAST 05/31/14   Wendie Agreste, MD  PARoxetine (PAXIL) 10 MG tablet Take 5 tablets (50 mg total) by mouth daily. For depression 05/07/14   Freda Munro May Agustin, NP   BP 132/58 mmHg  Pulse 92  Temp(Src) 98 F (36.7 C) (Oral)  Resp 17  Ht 5\' 7"  (1.702 m)  Wt 310 lb (140.615 kg)  BMI 48.54 kg/m2  SpO2 95%  LMP 06/04/2014 Physical Exam  Constitutional: She is oriented to person, place, and  time. She appears well-developed and well-nourished. No distress.  HENT:  Head: Normocephalic and atraumatic.  Right Ear: Hearing normal.  Left Ear: Hearing normal.  Nose: Nose normal.  Mouth/Throat: Oropharynx is clear and moist and mucous membranes are normal.  Eyes: Conjunctivae and EOM are normal. Pupils are equal, round, and reactive to light.  Neck: Normal range of motion. Neck supple.  Cardiovascular: Regular rhythm, S1 normal and S2 normal.  Exam reveals no gallop and no friction rub.   No murmur heard. Pulmonary/Chest: Effort normal and breath sounds normal. No respiratory distress. She exhibits no tenderness.  Abdominal: Soft. Normal appearance and bowel sounds are normal. There is no hepatosplenomegaly. There is no tenderness. There is no rebound, no guarding, no tenderness at McBurney's point and negative Murphy's sign. No hernia.  Musculoskeletal: Normal range of motion.  Neurological: She is alert and oriented to person, place, and time. She has normal strength. No cranial nerve deficit or sensory deficit. Coordination normal. GCS eye subscore is 4. GCS verbal subscore is 5. GCS motor subscore is 6.  Skin: Skin is warm, dry and intact. No rash noted. No cyanosis.  Psychiatric: She has a normal mood and affect. Her speech is normal and behavior is normal. Thought content normal.  Nursing note and vitals reviewed.   ED Course  Procedures (including critical care time) Labs Review Labs Reviewed  BASIC METABOLIC PANEL - Abnormal; Notable for the following:    Anion gap 19 (*)    All other components within normal limits  CBC  D-DIMER, QUANTITATIVE  I-STAT TROPOININ, ED    Imaging Review Dg Chest 2 View  06/07/2014   CLINICAL DATA:  Chest pain beginning tonight.  EXAM: CHEST  2 VIEW  COMPARISON:  CT abdomen and pelvis 04/10/2013. PA and lateral chest 04/13/2013 and 03/26/2014.  FINDINGS: Lungs are clear. Heart size is normal. No pneumothorax or pleural effusion is  identified. Prominent epicardial fat on the right is noted, unchanged  IMPRESSION: No acute disease.   Electronically Signed   By: Inge Rise M.D.   On: 06/07/2014 05:01     EKG Interpretation   Date/Time:  Thursday June 07 2014 04:12:25 EST Ventricular Rate:  84 PR Interval:  120 QRS Duration: 88 QT Interval:  365 QTC Calculation: 431 R Axis:   44 Text Interpretation:  Sinus rhythm Normal ECG Confirmed by Kemari Narez  MD,  Satchel Heidinger [phone]) on 06/07/2014 4:26:27 AM      MDM   Final diagnoses:  Chest pain  Depression  Suicidal ideation  Alcohol abuse    Patient presents to the ER for evaluation of chest pain. Patient complaining of a sharp  pain in her chest. Patient was just seen at [facility] for headache and was identified as being acutely intoxicated on alcohol. Patient has a history of drug and alcohol abuse. She does not have significant cardiac risk factors. Pain is atypical. Troponin was negative. EKG was normal. D-dimer was negative. Patient continually asking for pain medicine. I suspect that this is her reason for coming to the ER, was administered a dose of analgesia here in the ER and will be discharged. Follow-up with her doctor.  Addendum: Upon attempted to discharge the patient, patient now reports that she is suicidal. She reports that she is disappointed in herself relapsing on alcohol and wants to harm herself. Will obtain behavioral health evaluation.  Orpah Greek, MD 06/07/14 [place], MD 06/07/14 [id number]

## 2014-06-07 NOTE — ED Notes (Signed)
Pt wanded by security at this time  ?

## 2014-06-07 NOTE — ED Notes (Signed)
While administering medications, pt states that she would like to speak to provider, states that she wants to try to be admitted. Pt up for discharge at this time, pt informed that there is no medical reason to admit her at this time, informed her that lab wok and xray are negative.

## 2014-06-07 NOTE — BH Assessment (Signed)
Per Dr. Louretta Shorten patient meets criteria for inpatient hospitalization.  Per Otila Kluver [facility]) no beds at [facility].
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
## 2014-06-07 NOTE — ED Notes (Addendum)
MD Jonnalagada at bedside.

## 2014-06-07 NOTE — Telephone Encounter (Signed)
I have not seen a req for this from pharmacy. Dr Laney Pastor, this dose is higher than I believe you Rxd last on 03/05/14, which was 800 mg TID instead of QID. Pt was given this dose by a Janett Labella, NP on 10/12. Do you want to RF at this current dose?

## 2014-06-07 NOTE — ED Notes (Signed)
Pt arrives via EMS with c/o chesrt pain in central chest, states it feels like someone is sitting on her chest. Pain started 3 hours prior to arrival. 324 MG aspirin PTA,1 SL nitro. ETOH on board. Nausea present. States 9/10. Headache present after nitro administration per EMS. Was seen at [facility] earlier today.

## 2014-06-07 NOTE — ED Notes (Signed)
Meal tray ordered for patient.

## 2014-06-07 NOTE — Progress Notes (Signed)
Pt to be assessed by Dr. Lorna Dibble.  Charlene Brooke, MSW Social Worker [phone]

## 2014-06-07 NOTE — ED Notes (Signed)
Patient has pending acceptance to [facility]. Tamela Oddi has called and states ov has concerns because of pt history of sleep apnea

## 2014-06-07 NOTE — BH Assessment (Signed)
[facility] Assessment Progress Note  Spoke with Dr. Waverly Ferrari, took history of pt, who has been to the hospital twice within 24 hrs and was about to be discharged and then said she was feeling bad about herself since she had relapsed and is now suicidal.  Teleassessment scheduled for 7:30

## 2014-06-07 NOTE — ED Notes (Signed)
PATIENT UP TO USE THE PHONE TO CALL HER FAMILY. PT IS TEARFUL ON THE PHONE

## 2014-06-07 NOTE — ED Notes (Signed)
Pt cleared to go to [facility]

## 2014-06-07 NOTE — ED Notes (Signed)
[name] has arrived to transport

## 2014-06-07 NOTE — ED Notes (Signed)
Pt c/o anxiety, states "I can't believe I hit bottom like this." CIWA 1, however continually requesting ativan. 1 MG tab given and emotional support given. Pt resting in room presently.

## 2014-06-07 NOTE — ED Notes (Signed)
PATIENT HAVIING ISSUES WITH HER MEDICATONS, SINCE ARRIVAL ON POD C SHE HAS REQUESTED PAIN MEDICINE FOR HER ABDOMINAL PAIN , AND NOW IS CURRENTLY ASKING FOR  MORE ATIVAN. EXPLAINED TO PT THAT SAFETY IS A CONCERN IN ADMINISTERING MEDS. THAT DUE TO HER RECEIVING ATIVAN AT 0630 AND HAVING A BLOOD ETOH OF 148 IS A CONCERN. PATIENT RAISING HER VOICE SAYING THAT THE COUNSELOR ON THE TV TOLD HER THAT SHE WOULD ORDER HER MORE ATIVAN. EXPLAINED THAT THE COUNSELOR IS UNABLE TO PRESCRIBE MEDS, THAT THE EDP WOULD DO THE PRESCRIBING. CALLED COUNSELOR EMILY AND SPOKE TO HER. SHE ADVISES SHE HAS NOT TOLD THE PT SHE WOULD ORDER ATIVAN. EMILY CLARIFIED THIS WITH THE PATIENT ON THE PHONE. DR RAXEN NOTIFIED OF PATIENT CONTINUED REQUEST FOR MEDICATIONS AND HE ADVISES HE WILL COME AND SPEAK TO THE PATIENT

## 2014-06-07 NOTE — ED Provider Notes (Signed)
2:47 PM  Pt accepted to [facility] She is awake/alert, ambulatory no distress She feels comfortable with plan to transfer Mild tachycardia noted, but otherwise appropriate for transfer  Sharyon Cable, MD 06/07/14 1447

## 2014-06-07 NOTE — ED Notes (Signed)
Pt consulting with TTS at this time.

## 2014-06-07 NOTE — Progress Notes (Signed)
Pt assessed by Dr. Janina Mayo and meets inpatient criteria for psychiatric admission. Pt clinicals faxed out to :  [facility]  Will continue to pursue placement.  Charlene Brooke, MSW  Social Worker [phone]

## 2014-06-07 NOTE — ED Notes (Signed)
Sitter present at bedside at this time

## 2014-06-07 NOTE — ED Notes (Addendum)
[facility] called to transport, they advise that it is their shift change and it may be 4 oclock before a driver can be available.

## 2014-06-07 NOTE — ED Notes (Signed)
tts in progress, informed dr Eulis Foster of pt complaint of abdominal pain. And he does not wish to administer any narcotic pain meds for the patient due to her request for detox and her alcohol intoxication
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
## 2014-06-07 NOTE — ED Notes (Signed)
Patient states she is fighting anxiety very hard since 10 am, she asked 2-3 times since dr Eulis Foster has talked to her for anxiety medication and each time pt has been reminded of dr Eulis Foster recommendations. She is currently waiting for dr Lamar Benes

## 2014-06-07 NOTE — ED Notes (Signed)
Pt at nurse's station using phone. Tearful.

## 2014-06-07 NOTE — ED Notes (Addendum)
Pt called nurse back into room, states that she relapsed from alcohol sobriety. States she wants detox at this time and is now suicidal with no plan. No hallucinations. EDP made aware. See new orders.

## 2014-06-07 NOTE — Discharge Instructions (Signed)

## 2014-06-07 NOTE — ED Notes (Signed)
Pt c/o increased anxiety and feeling sweaty. Pt reassured. No medication due at this time. Pt given an ice pack.

## 2014-06-07 NOTE — Progress Notes (Signed)
Pt has been accepted at [facility], accepting physician Dr. Alonna Minium, call report to [phone]. Pod C RN aware. Pt to be transported via Sherrin Daisy, MSW  Social Worker [phone]

## 2014-06-07 NOTE — ED Provider Notes (Signed)
D/w dr Louretta Shorten Will admit    [name] Cable, MD 06/07/14 1230

## 2014-06-07 NOTE — ED Notes (Signed)
House coverage notified at this time for need of sitter. Charge nurse made aware of need for sitter.

## 2014-06-07 NOTE — ED Notes (Signed)
Pod C Pyxis cubie jammed. Charge RN made aware. Awaiting gabapentin from pharmacy.

## 2014-06-07 NOTE — ED Provider Notes (Signed)
08:40- I was asked to see the patient because she wanted Ativan, and there were none ordered.  She had previously asked for Dilaudid, and I declined to order it. She has been seen by tele-psychiatry, who was concerned that she was still intoxicated and might enter into delirium tremens.  We are evaluating her for her stated suicidal ideation, which is present, without stated intent.   I interviewed the patient, and she continued to request Ativan for her alcohol withdrawal symptom. At this time, she is not tremulous, disoriented, or acting out. I explained that I did not want to give her anything to sedate her at this time.  She understands that [facility] is considering admitting her.  Assessment: Polysubstance abuse, with alcohol intoxication, and manipulative behavior.  I do not think that she is truly suicidal at this time. She is seeking sedating medication, and this  is consistent with her polysubstance abuse.  Richarda Blade, MD 06/07/14 [phone]

## 2014-06-07 NOTE — ED Notes (Signed)
Pt has made first phone call at this time.

## 2014-06-07 NOTE — ED Notes (Addendum)
Spoke with [facility] counselor on phone. Counselor states that since pt  has ETOH of 148, and could possibly be experiencing early signs of DT's, she wants the provider to evaluate further. Pt states she drinks a fifth of liquor a [place]. Will call back when she speaks to provider.

## 2014-06-08 MED ORDER — GABAPENTIN 400 MG PO CAPS: 800.0000 mg | ORAL_CAPSULE | Freq: Four times a day (QID) | ORAL | Status: DC

## 2014-06-08 NOTE — Telephone Encounter (Signed)
Tried to call pt, but the # given below is out of order. The H # in EPIC had a VM that has not been set up. Couldn't LM.

## 2014-06-08 NOTE — Telephone Encounter (Signed)
This is ok but she needs f/u appt here at 104 in 1-2 months

## 2014-06-11 NOTE — Telephone Encounter (Signed)
Tried to call pt again. # below is still out of order and other # has VM not set up. I will send unable to reach note to advise pt she needs f/up OV.

## 2014-06-25 ENCOUNTER — Emergency Department (HOSPITAL_COMMUNITY)
Admission: EM | Admit: 2014-06-25 | Discharge: 2014-06-26 | Disposition: A | Discharge status: Other Acute Inpatient Hospital | Payer: No Typology Code available for payment source | Attending: Emergency Medicine | Admitting: Emergency Medicine

## 2014-06-25 ENCOUNTER — Encounter (HOSPITAL_COMMUNITY): Payer: Self-pay | Admitting: Emergency Medicine

## 2014-06-25 DIAGNOSIS — E079 Disorder of thyroid, unspecified: Secondary | ICD-10-CM | POA: Diagnosis not present

## 2014-06-25 DIAGNOSIS — M549 Dorsalgia, unspecified: Secondary | ICD-10-CM | POA: Insufficient documentation

## 2014-06-25 DIAGNOSIS — G8929 Other chronic pain: Secondary | ICD-10-CM | POA: Diagnosis not present

## 2014-06-25 DIAGNOSIS — R109 Unspecified abdominal pain: Secondary | ICD-10-CM | POA: Insufficient documentation

## 2014-06-25 DIAGNOSIS — G4730 Sleep apnea, unspecified: Secondary | ICD-10-CM | POA: Insufficient documentation

## 2014-06-25 DIAGNOSIS — Z9981 Dependence on supplemental oxygen: Secondary | ICD-10-CM | POA: Diagnosis not present

## 2014-06-25 DIAGNOSIS — M1990 Unspecified osteoarthritis, unspecified site: Secondary | ICD-10-CM | POA: Insufficient documentation

## 2014-06-25 DIAGNOSIS — E669 Obesity, unspecified: Secondary | ICD-10-CM | POA: Insufficient documentation

## 2014-06-25 DIAGNOSIS — Z8544 Personal history of malignant neoplasm of other female genital organs: Secondary | ICD-10-CM | POA: Insufficient documentation

## 2014-06-25 DIAGNOSIS — F419 Anxiety disorder, unspecified: Secondary | ICD-10-CM | POA: Diagnosis not present

## 2014-06-25 DIAGNOSIS — Z880 Allergy status to penicillin: Secondary | ICD-10-CM | POA: Insufficient documentation

## 2014-06-25 DIAGNOSIS — F1010 Alcohol abuse, uncomplicated: Secondary | ICD-10-CM | POA: Diagnosis present

## 2014-06-25 DIAGNOSIS — Z79899 Other long term (current) drug therapy: Secondary | ICD-10-CM | POA: Diagnosis not present

## 2014-06-25 DIAGNOSIS — Z720 Tobacco use: Secondary | ICD-10-CM | POA: Diagnosis not present

## 2014-06-25 HISTORY — DX: Alcohol abuse, uncomplicated: F10.10

## 2014-06-25 LAB — CBC WITH DIFFERENTIAL/PLATELET
BASOS PCT: 1 % (ref 0–1)
Basophils Absolute: 0.1 10*3/uL (ref 0.0–0.1)
EOS ABS: 0.1 10*3/uL (ref 0.0–0.7)
EOS PCT: 2 % (ref 0–5)
HCT: 39 % (ref 36.0–46.0)
Hemoglobin: 13 g/dL (ref 12.0–15.0)
LYMPHS ABS: 2.1 10*3/uL (ref 0.7–4.0)
Lymphocytes Relative: 34 % (ref 12–46)
MCH: 31.9 pg (ref 26.0–34.0)
MCHC: 33.3 g/dL (ref 30.0–36.0)
MCV: 95.8 fL (ref 78.0–100.0)
MONOS PCT: 4 % (ref 3–12)
Monocytes Absolute: 0.3 10*3/uL (ref 0.1–1.0)
NEUTROS PCT: 59 % (ref 43–77)
Neutro Abs: 3.6 10*3/uL (ref 1.7–7.7)
Platelets: 288 10*3/uL (ref 150–400)
RBC: 4.07 MIL/uL (ref 3.87–5.11)
RDW: 13.4 % (ref 11.5–15.5)
WBC: 6.1 10*3/uL (ref 4.0–10.5)

## 2014-06-25 LAB — COMPREHENSIVE METABOLIC PANEL
ALK PHOS: 52 U/L (ref 39–117)
ALT: 61 U/L — ABNORMAL HIGH (ref 0–35)
AST: 44 U/L — ABNORMAL HIGH (ref 0–37)
Albumin: 3.3 g/dL — ABNORMAL LOW (ref 3.5–5.2)
Anion gap: 16 — ABNORMAL HIGH (ref 5–15)
BUN: 6 mg/dL (ref 6–23)
CO2: 21 mEq/L (ref 19–32)
Calcium: 9.1 mg/dL (ref 8.4–10.5)
Chloride: 105 mEq/L (ref 96–112)
Creatinine, Ser: 0.85 mg/dL (ref 0.50–1.10)
GFR calc non Af Amer: 85 mL/min — ABNORMAL LOW (ref >90)
Glucose, Bld: 106 mg/dL — ABNORMAL HIGH (ref 70–99)
Potassium: 4.4 mEq/L (ref 3.7–5.3)
Sodium: 142 mEq/L (ref 137–147)
TOTAL PROTEIN: 7 g/dL (ref 6.0–8.3)
Total Bilirubin: <0.2 mg/dL — ABNORMAL LOW (ref 0.3–1.2)

## 2014-06-25 LAB — RAPID URINE DRUG SCREEN, HOSP PERFORMED
AMPHETAMINES: NOT DETECTED
Barbiturates: NOT DETECTED
Benzodiazepines: NOT DETECTED
Cocaine: NOT DETECTED
Opiates: NOT DETECTED
Tetrahydrocannabinol: NOT DETECTED

## 2014-06-25 LAB — URINALYSIS, ROUTINE W REFLEX MICROSCOPIC
Bilirubin Urine: NEGATIVE
Glucose, UA: NEGATIVE mg/dL
HGB URINE DIPSTICK: NEGATIVE
Ketones, ur: NEGATIVE mg/dL
Leukocytes, UA: NEGATIVE
Nitrite: NEGATIVE
PROTEIN: NEGATIVE mg/dL
Specific Gravity, Urine: 1.004 — ABNORMAL LOW (ref 1.005–1.030)
Urobilinogen, UA: 0.2 mg/dL (ref 0.0–1.0)
pH: 5.5 (ref 5.0–8.0)

## 2014-06-25 LAB — POC URINE PREG, ED: Preg Test, Ur: NEGATIVE

## 2014-06-25 LAB — ETHANOL: ALCOHOL ETHYL (B): 240 mg/dL — AB (ref 0–11)

## 2014-06-25 LAB — LIPASE, BLOOD: Lipase: 20 U/L (ref 11–59)

## 2014-06-25 MED ORDER — VITAMIN B-1 100 MG PO TABS
100.0000 mg | ORAL_TABLET | Freq: Every day | ORAL | Status: DC
  Administered 2014-06-25 – 2014-06-26 (×2): 100 mg via ORAL
  Filled 2014-06-25 (×2): qty 1

## 2014-06-25 MED ORDER — CELECOXIB 200 MG PO CAPS
200.0000 mg | ORAL_CAPSULE | Freq: Two times a day (BID) | ORAL | Status: DC
  Administered 2014-06-26: 200 mg via ORAL
  Filled 2014-06-25 (×2): qty 1

## 2014-06-25 MED ORDER — NICOTINE 7 MG/24HR TD PT24
7.0000 mg | MEDICATED_PATCH | Freq: Once | TRANSDERMAL | Status: DC
  Administered 2014-06-25: 7 mg via TRANSDERMAL
  Filled 2014-06-25: qty 1

## 2014-06-25 MED ORDER — LEVOTHYROXINE SODIUM 50 MCG PO TABS
50.0000 ug | ORAL_TABLET | Freq: Every day | ORAL | Status: DC
  Administered 2014-06-26: 50 ug via ORAL
  Filled 2014-06-25 (×2): qty 1

## 2014-06-25 MED ORDER — GABAPENTIN 400 MG PO CAPS
800.0000 mg | ORAL_CAPSULE | Freq: Four times a day (QID) | ORAL | Status: DC
  Administered 2014-06-25 – 2014-06-26 (×2): 800 mg via ORAL
  Filled 2014-06-25 (×2): qty 2

## 2014-06-25 MED ORDER — ONDANSETRON 4 MG PO TBDP
4.0000 mg | ORAL_TABLET | Freq: Once | ORAL | Status: AC
  Administered 2014-06-25: 4 mg via ORAL
  Filled 2014-06-25: qty 1

## 2014-06-25 MED ORDER — SODIUM CHLORIDE 0.9 % IV BOLUS (SEPSIS)
1000.0000 mL | Freq: Once | INTRAVENOUS | Status: AC
  Administered 2014-06-25: 1000 mL via INTRAVENOUS

## 2014-06-25 MED ORDER — THIAMINE HCL 100 MG/ML IJ SOLN
100.0000 mg | Freq: Every day | INTRAMUSCULAR | Status: DC
  Filled 2014-06-25: qty 2

## 2014-06-25 MED ORDER — PAROXETINE HCL 30 MG PO TABS: 50.0000 mg | ORAL_TABLET | Freq: Every day | ORAL | Status: DC

## 2014-06-25 MED ORDER — LORAZEPAM 1 MG PO TABS
0.0000 mg | ORAL_TABLET | Freq: Two times a day (BID) | ORAL | Status: DC
Start: 2014-06-25 — End: 2014-06-26
  Administered 2014-06-25 – 2014-06-26 (×2): 1 mg via ORAL
  Filled 2014-06-25 (×2): qty 1

## 2014-06-25 MED ORDER — MIRTAZAPINE 7.5 MG PO TABS
7.5000 mg | ORAL_TABLET | Freq: Every day | ORAL | Status: DC
  Administered 2014-06-25: 7.5 mg via ORAL
  Filled 2014-06-25 (×2): qty 1

## 2014-06-25 MED ORDER — HYDROXYZINE HCL 25 MG PO TABS
25.0000 mg | ORAL_TABLET | Freq: Four times a day (QID) | ORAL | Status: DC | PRN
  Administered 2014-06-26 (×2): 25 mg via ORAL
  Filled 2014-06-25 (×2): qty 1
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
## 2014-06-25 NOTE — ED Notes (Signed)
Pt reports pain is a 10/10.  Is having a h/a.  Pt made aware that Suezanne Jacquet EDPA was made aware.  Pt is resting with eyes closed.  Symmetrical rise and fall of chest and snoring noted.

## 2014-06-25 NOTE — ED Notes (Signed)
Pt states drinks everyday, just over did it today.

## 2014-06-25 NOTE — ED Notes (Signed)
Pt drank approx 862ml wine today and called EMS for feeling depressed. Pt has hx of alcohol abuse and detox treatment. Pt given 559ml NS by EMS PTA.

## 2014-06-25 NOTE — ED Notes (Signed)
Patient called out complaining of nausea with no vomiting. Informed provider. New order obtained.

## 2014-06-25 NOTE — ED Notes (Signed)
Provided patient 2 warm blankets.

## 2014-06-25 NOTE — ED Notes (Signed)
Pt ambulated to the BR with steady gait.  Pt states that she needs help with her meds.  States that she needs nicotine patch and med for her anxiety.  Pt made aware that d/t the amount of alcohol in her system that there is possibility that the provider will not order anxiety medicine.  [place] notified.

## 2014-06-25 NOTE — ED Notes (Signed)
When entering the room, patient was resting quietly with eyes closed. Appears in no respiratory distress.

## 2014-06-26 ENCOUNTER — Observation Stay (HOSPITAL_COMMUNITY)
Admission: AD | Admit: 2014-06-26 | Discharge: 2014-06-27 | Disposition: A | Discharge status: Home / Self Care | Payer: No Typology Code available for payment source | Source: Intra-hospital | Attending: Psychiatry | Admitting: Psychiatry

## 2014-06-26 ENCOUNTER — Encounter (HOSPITAL_COMMUNITY): Payer: Self-pay | Admitting: Registered Nurse

## 2014-06-26 DIAGNOSIS — G4730 Sleep apnea, unspecified: Secondary | ICD-10-CM | POA: Diagnosis not present

## 2014-06-26 DIAGNOSIS — G629 Polyneuropathy, unspecified: Secondary | ICD-10-CM | POA: Insufficient documentation

## 2014-06-26 DIAGNOSIS — F419 Anxiety disorder, unspecified: Secondary | ICD-10-CM | POA: Diagnosis not present

## 2014-06-26 DIAGNOSIS — M170 Bilateral primary osteoarthritis of knee: Secondary | ICD-10-CM | POA: Insufficient documentation

## 2014-06-26 DIAGNOSIS — M549 Dorsalgia, unspecified: Secondary | ICD-10-CM | POA: Diagnosis not present

## 2014-06-26 DIAGNOSIS — R45851 Suicidal ideations: Secondary | ICD-10-CM | POA: Diagnosis not present

## 2014-06-26 DIAGNOSIS — F329 Major depressive disorder, single episode, unspecified: Principal | ICD-10-CM | POA: Insufficient documentation

## 2014-06-26 DIAGNOSIS — F1994 Other psychoactive substance use, unspecified with psychoactive substance-induced mood disorder: Secondary | ICD-10-CM | POA: Diagnosis present

## 2014-06-26 DIAGNOSIS — F17210 Nicotine dependence, cigarettes, uncomplicated: Secondary | ICD-10-CM | POA: Diagnosis not present

## 2014-06-26 DIAGNOSIS — E039 Hypothyroidism, unspecified: Secondary | ICD-10-CM | POA: Diagnosis not present

## 2014-06-26 DIAGNOSIS — F1014 Alcohol abuse with alcohol-induced mood disorder: Secondary | ICD-10-CM | POA: Diagnosis present

## 2014-06-26 DIAGNOSIS — F10230 Alcohol dependence with withdrawal, uncomplicated: Secondary | ICD-10-CM | POA: Diagnosis present

## 2014-06-26 DIAGNOSIS — Z8589 Personal history of malignant neoplasm of other organs and systems: Secondary | ICD-10-CM | POA: Insufficient documentation

## 2014-06-26 DIAGNOSIS — Z885 Allergy status to narcotic agent status: Secondary | ICD-10-CM | POA: Diagnosis not present

## 2014-06-26 DIAGNOSIS — F1120 Opioid dependence, uncomplicated: Secondary | ICD-10-CM | POA: Diagnosis present

## 2014-06-26 DIAGNOSIS — Z880 Allergy status to penicillin: Secondary | ICD-10-CM | POA: Diagnosis not present

## 2014-06-26 DIAGNOSIS — K219 Gastro-esophageal reflux disease without esophagitis: Secondary | ICD-10-CM | POA: Diagnosis not present

## 2014-06-26 DIAGNOSIS — E282 Polycystic ovarian syndrome: Secondary | ICD-10-CM | POA: Diagnosis not present

## 2014-06-26 DIAGNOSIS — G8929 Other chronic pain: Secondary | ICD-10-CM | POA: Insufficient documentation

## 2014-06-26 DIAGNOSIS — F1910 Other psychoactive substance abuse, uncomplicated: Secondary | ICD-10-CM | POA: Diagnosis not present

## 2014-06-26 DIAGNOSIS — F1920 Other psychoactive substance dependence, uncomplicated: Secondary | ICD-10-CM

## 2014-06-26 MED ORDER — MAGNESIUM HYDROXIDE 400 MG/5ML PO SUSP: 30.0000 mL | Freq: Every day | ORAL | Status: DC | PRN

## 2014-06-26 MED ORDER — GABAPENTIN 300 MG PO CAPS
300.0000 mg | ORAL_CAPSULE | Freq: Four times a day (QID) | ORAL | Status: DC
  Administered 2014-06-26 – 2014-06-27 (×5): 300 mg via ORAL
  Filled 2014-06-26 (×12): qty 1

## 2014-06-26 MED ORDER — LEVOTHYROXINE SODIUM 50 MCG PO TABS
50.0000 ug | ORAL_TABLET | Freq: Every day | ORAL | Status: DC
  Administered 2014-06-27: 50 ug via ORAL
  Filled 2014-06-26 (×2): qty 1

## 2014-06-26 MED ORDER — ONDANSETRON HCL 4 MG PO TABS
4.0000 mg | ORAL_TABLET | Freq: Once | ORAL | Status: AC
  Administered 2014-06-26: 4 mg via ORAL
  Filled 2014-06-26: qty 1

## 2014-06-26 MED ORDER — CLONIDINE HCL 0.1 MG PO TABS: 0.1000 mg | ORAL_TABLET | Freq: Every day | ORAL | Status: DC

## 2014-06-26 MED ORDER — MIRTAZAPINE 30 MG PO TABS
30.0000 mg | ORAL_TABLET | Freq: Every day | ORAL | Status: DC
  Administered 2014-06-26: 30 mg via ORAL
  Filled 2014-06-26: qty 1
  Filled 2014-06-26: qty 2
  Filled 2014-06-26: qty 1

## 2014-06-26 MED ORDER — NICOTINE 21 MG/24HR TD PT24
21.0000 mg | MEDICATED_PATCH | Freq: Every day | TRANSDERMAL | Status: DC
  Administered 2014-06-26 – 2014-06-27 (×2): 21 mg via TRANSDERMAL
  Filled 2014-06-26 (×4): qty 1

## 2014-06-26 MED ORDER — LOPERAMIDE HCL 2 MG PO CAPS
4.0000 mg | ORAL_CAPSULE | Freq: Once | ORAL | Status: AC
  Administered 2014-06-26: 4 mg via ORAL
  Filled 2014-06-26: qty 2

## 2014-06-26 MED ORDER — NAPROXEN 500 MG PO TABS
500.0000 mg | ORAL_TABLET | Freq: Two times a day (BID) | ORAL | Status: DC | PRN
  Administered 2014-06-27: 500 mg via ORAL
  Filled 2014-06-26: qty 1

## 2014-06-26 MED ORDER — GABAPENTIN 400 MG PO CAPS: 800.0000 mg | ORAL_CAPSULE | Freq: Three times a day (TID) | ORAL | Status: DC

## 2014-06-26 MED ORDER — ACETAMINOPHEN 325 MG PO TABS
650.0000 mg | ORAL_TABLET | Freq: Four times a day (QID) | ORAL | Status: DC | PRN
Start: 2014-06-26 — End: 2014-06-27
  Administered 2014-06-26: 650 mg via ORAL
  Filled 2014-06-26: qty 2

## 2014-06-26 MED ORDER — CLONIDINE HCL 0.1 MG PO TABS
0.1000 mg | ORAL_TABLET | Freq: Four times a day (QID) | ORAL | Status: DC
  Administered 2014-06-26 – 2014-06-27 (×5): 0.1 mg via ORAL
  Filled 2014-06-26 (×12): qty 1

## 2014-06-26 MED ORDER — PAROXETINE HCL 20 MG PO TABS
40.0000 mg | ORAL_TABLET | Freq: Every day | ORAL | Status: DC
  Administered 2014-06-26: 40 mg via ORAL
  Filled 2014-06-26: qty 2

## 2014-06-26 MED ORDER — CELECOXIB 200 MG PO CAPS
200.0000 mg | ORAL_CAPSULE | Freq: Two times a day (BID) | ORAL | Status: DC
  Administered 2014-06-26 – 2014-06-27 (×3): 200 mg via ORAL
  Filled 2014-06-26: qty 1
  Filled 2014-06-26 (×2): qty 2
  Filled 2014-06-26 (×2): qty 1
  Filled 2014-06-26: qty 2
  Filled 2014-06-26: qty 1

## 2014-06-26 MED ORDER — MIRTAZAPINE 30 MG PO TABS: 30.0000 mg | ORAL_TABLET | Freq: Every day | ORAL | Status: DC

## 2014-06-26 MED ORDER — HYDROXYZINE HCL 25 MG PO TABS
25.0000 mg | ORAL_TABLET | Freq: Four times a day (QID) | ORAL | Status: DC | PRN
  Administered 2014-06-26 – 2014-06-27 (×2): 25 mg via ORAL
  Filled 2014-06-26 (×2): qty 1

## 2014-06-26 MED ORDER — ALUM & MAG HYDROXIDE-SIMETH 200-200-20 MG/5ML PO SUSP: 30.0000 mL | ORAL | Status: DC | PRN

## 2014-06-26 MED ORDER — PAROXETINE HCL 10 MG PO TABS
30.0000 mg | ORAL_TABLET | Freq: Every day | ORAL | Status: DC
  Administered 2014-06-27: 30 mg via ORAL
  Filled 2014-06-26 (×4): qty 3

## 2014-06-26 MED ORDER — CHLORDIAZEPOXIDE HCL 25 MG PO CAPS
25.0000 mg | ORAL_CAPSULE | Freq: Four times a day (QID) | ORAL | Status: DC | PRN
  Administered 2014-06-26 – 2014-06-27 (×3): 25 mg via ORAL
  Filled 2014-06-26 (×3): qty 1

## 2014-06-26 MED ORDER — CLONIDINE HCL 0.1 MG PO TABS: 0.1000 mg | ORAL_TABLET | ORAL | Status: DC

## 2014-06-26 NOTE — ED Notes (Signed)
Patient is lying on her left side, with eyes closed, snoring. Appears in no respiratory distress. Respirations are even, regular, and unlabored.

## 2014-06-26 NOTE — ED Notes (Signed)
Patient is constantly asking for Ativan. Informed patient that her CIWA score does not met criteria to have Ativan. Offered Hydroxyine as ordered for anxiety but she declined. Offered a pillow and dimmed lights completely. Repositioned patient on stretcher.

## 2014-06-26 NOTE — Progress Notes (Signed)
Pt alert, oriented, irritable, demanding but cooperative. Affect/mood anxious and depressed. Denies SI/HI @ present to Probation officer. Verbally contracts for safety. -A/[place]. C/o anxiety and headache see MAR.  Emotional support and encouragement given. Will continue to monitor closely and evaluate for stabilization.
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
## 2014-06-26 NOTE — ED Notes (Signed)
When entering the room patient was resting with eyes closed on her right side. Snoring. Pt is alert, oriented x 4, while taking vital signs. She has requested a pillow, lights to be more dimmed, and Ativan. Will perform CIWA to see if Ativan is needed.

## 2014-06-26 NOTE — ED Notes (Signed)
Psychiatry at bedside.

## 2014-06-26 NOTE — Progress Notes (Signed)
Patient ID: [name], female   DOB: October 30, 1975, 38 y.o.   MRN: [id number] [name] is a 65 and she requested something for anxiety and withdrawal. She is very anxious,sweaty, nauseous, and has a headache at back of neck, feels her hands are burning and her anxiety is severe. She states Atarax doesn't help at all and it was she that very quickly noticed initially that she did not have a detox protocol for her alcohol use ordered. Neil Crouch NP for the orders. She now can have prn Librium and routine Clonidine. She has called her daughter twice and is upset and wanting to talk with someone about the feelings she has re her daughter, and that her daughter deserves to have a mom at home. Discussed her daughter deserving to have a sober present mom, and that this is the kindess thing she can do for her daughter and that our children are good motivaters. She is tearful. She has had some sobriety in her life, as much as four years, and given positive feedback for that success. Reassurance given.

## 2014-06-26 NOTE — BH Assessment (Signed)
[place] Assessment Progress Note  Per Letitia Libra, RN, [facility], Observation bed 4 is available for pt, who was previously accepted by Earleen Newport, FNP. Pt signed Voluntary Admission and Consent for Treatment, as well as Consent to Release Information, both of which were faxed to [facility]. Pt's nurse, Apolonio Schneiders, has been informed; she agrees to send original paperwork with the pt and to call report to [phone] or [phone] .  [name], MA  Triage Specialist  06/26/2014 @ 13:54

## 2014-06-26 NOTE — ED Notes (Signed)
TTS at bedside. 

## 2014-06-26 NOTE — ED Notes (Signed)
Patient came out the nursing station, requesting Ativan. Informed Dr. Sharol Given of patients request. At this time, patient can not have a PRN dose of Ativan. Went back to bedside, informed patient of this information. She was lying on her back, resting quietly when entering the room.

## 2014-06-26 NOTE — Tx Team (Signed)
Initial Interdisciplinary Treatment Plan   PATIENT STRESSORS: Health problems Legal issue Substance abuse   PATIENT STRENGTHS: none noted    PROBLEM LIST: Problem List/Patient Goals Date to be addressed Date deferred Reason deferred Estimated date of resolution  Substance abuse/ detox and withdrawal 06/26/14   DC  depression                                                 DISCHARGE CRITERIA:  Adequate post-discharge living arrangements Motivation to continue treatment in a less acute level of care Withdrawal symptoms are absent or subacute and managed without 24-hour nursing intervention  PRELIMINARY DISCHARGE PLAN: Outpatient therapy  PATIENT/FAMIILY INVOLVEMENT: This treatment plan has been presented to and reviewed with the patient, [name], and/or family member, pt..  The patient and family have been given the opportunity to ask questions and make suggestions.  [name] 06/26/2014, 3:45 PM

## 2014-06-26 NOTE — ED Notes (Signed)
Patient resting quietly with her eyes closed on her back. Observed rise and fall of chest.

## 2014-06-26 NOTE — ED Notes (Addendum)
Dr. Ree Kida patient wanted to talk to him about her medications.

## 2014-06-26 NOTE — ED Notes (Signed)
Pt ambulated to restroom and back to bed. She requested to try the hydroxyzine for anxiety. Pt is calm, cooperative, and able to lay on stretcher, resting quietly.

## 2014-06-26 NOTE — ED Provider Notes (Signed)
2:20 PM patient alert and auditory Glasgow Coma Score 15 cooperative. S table for transfer to [facility] Results for orders placed or performed during the hospital encounter of 06/25/14  CBC with Differential  Result Value Ref Range   WBC 6.1 4.0 - 10.5 K/uL   RBC 4.07 3.87 - 5.11 MIL/uL   Hemoglobin 13.0 12.0 - 15.0 g/dL   HCT 39.0 36.0 - 46.0 %   MCV 95.8 78.0 - 100.0 fL   MCH 31.9 26.0 - 34.0 pg   MCHC 33.3 30.0 - 36.0 g/dL   RDW 13.4 11.5 - 15.5 %   Platelets 288 150 - 400 K/uL   Neutrophils Relative % 59 43 - 77 %   Neutro Abs 3.6 1.7 - 7.7 K/uL   Lymphocytes Relative 34 12 - 46 %   Lymphs Abs 2.1 0.7 - 4.0 K/uL   Monocytes Relative 4 3 - 12 %   Monocytes Absolute 0.3 0.1 - 1.0 K/uL   Eosinophils Relative 2 0 - 5 %   Eosinophils Absolute 0.1 0.0 - 0.7 K/uL   Basophils Relative 1 0 - 1 %   Basophils Absolute 0.1 0.0 - 0.1 K/uL  Comprehensive metabolic panel  Result Value Ref Range   Sodium 142 137 - 147 mEq/L   Potassium 4.4 3.7 - 5.3 mEq/L   Chloride 105 96 - 112 mEq/L   CO2 21 19 - 32 mEq/L   Glucose, Bld 106 (H) 70 - 99 mg/dL   BUN 6 6 - 23 mg/dL   Creatinine, Ser 0.85 0.50 - 1.10 mg/dL   Calcium 9.1 8.4 - 10.5 mg/dL   Total Protein 7.0 6.0 - 8.3 g/dL   Albumin 3.3 (L) 3.5 - 5.2 g/dL   AST 44 (H) 0 - 37 U/L   ALT 61 (H) 0 - 35 U/L   Alkaline Phosphatase 52 39 - 117 U/L   Total Bilirubin <0.2 (L) 0.3 - 1.2 mg/dL   GFR calc non Af Amer 85 (L) >90 mL/min   GFR calc Af Amer >90 >90 mL/min   Anion gap 16 (H) 5 - 15  Lipase, blood  Result Value Ref Range   Lipase 20 11 - 59 U/L  Urinalysis, Routine w reflex microscopic  Result Value Ref Range   Color, Urine YELLOW YELLOW   APPearance CLEAR CLEAR   Specific Gravity, Urine 1.004 (L) 1.005 - 1.030   pH 5.5 5.0 - 8.0   Glucose, UA NEGATIVE NEGATIVE mg/dL   Hgb urine dipstick NEGATIVE NEGATIVE   Bilirubin Urine NEGATIVE NEGATIVE   Ketones, ur NEGATIVE NEGATIVE mg/dL   Protein, ur NEGATIVE NEGATIVE mg/dL   Urobilinogen,  UA 0.2 0.0 - 1.0 mg/dL   Nitrite NEGATIVE NEGATIVE   Leukocytes, UA NEGATIVE NEGATIVE  Urine rapid drug screen (hosp performed)  Result Value Ref Range   Opiates NONE DETECTED NONE DETECTED   Cocaine NONE DETECTED NONE DETECTED   Benzodiazepines NONE DETECTED NONE DETECTED   Amphetamines NONE DETECTED NONE DETECTED   Tetrahydrocannabinol NONE DETECTED NONE DETECTED   Barbiturates NONE DETECTED NONE DETECTED  Ethanol  Result Value Ref Range   Alcohol, Ethyl (B) 240 (H) 0 - 11 mg/dL  POC urine preg, ED  Result Value Ref Range   Preg Test, Ur NEGATIVE NEGATIVE   Dg Chest 2 View  06/07/2014   CLINICAL DATA:  Chest pain beginning tonight.  EXAM: CHEST  2 VIEW  COMPARISON:  CT abdomen and pelvis 04/10/2013. PA and lateral chest 04/13/2013 and 03/26/2014.  FINDINGS: Lungs are clear. Heart size is normal. No pneumothorax or pleural effusion is identified. Prominent epicardial fat on the right is noted, unchanged  IMPRESSION: No acute disease.   Electronically Signed   By: Inge Rise M.D.   On: 06/07/2014 05:01     Orlie Dakin, MD 06/26/14 [phone]

## 2014-06-26 NOTE — ED Notes (Signed)
MD at bedside. 
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
## 2014-06-27 DIAGNOSIS — F329 Major depressive disorder, single episode, unspecified: Secondary | ICD-10-CM | POA: Diagnosis not present

## 2014-06-27 DIAGNOSIS — F1994 Other psychoactive substance use, unspecified with psychoactive substance-induced mood disorder: Secondary | ICD-10-CM

## 2014-06-27 DIAGNOSIS — F1014 Alcohol abuse with alcohol-induced mood disorder: Secondary | ICD-10-CM

## 2014-06-27 MED ORDER — LORAZEPAM 1 MG PO TABS
1.0000 mg | ORAL_TABLET | Freq: Once | ORAL | Status: AC
  Administered 2014-06-27: 1 mg via ORAL
  Filled 2014-06-27: qty 1

## 2014-06-27 MED ORDER — MIRTAZAPINE 30 MG PO TABS: 30.0000 mg | ORAL_TABLET | Freq: Every day | ORAL | Status: DC

## 2014-06-27 MED ORDER — METHOCARBAMOL 500 MG PO TABS: 500.0000 mg | ORAL_TABLET | Freq: Three times a day (TID) | ORAL | Status: DC | PRN

## 2014-06-27 MED ORDER — LOPERAMIDE HCL 2 MG PO CAPS
2.0000 mg | ORAL_CAPSULE | ORAL | Status: DC | PRN
  Administered 2014-06-27: 2 mg via ORAL
  Administered 2014-06-27: 4 mg via ORAL
  Filled 2014-06-27 (×3): qty 1

## 2014-06-27 MED ORDER — HYDROXYZINE HCL 25 MG PO TABS: 25.0000 mg | ORAL_TABLET | Freq: Four times a day (QID) | ORAL | Status: DC | PRN

## 2014-06-27 MED ORDER — METHOCARBAMOL 500 MG PO TABS
500.0000 mg | ORAL_TABLET | Freq: Three times a day (TID) | ORAL | Status: DC | PRN
  Administered 2014-06-27: 500 mg via ORAL
  Filled 2014-06-27: qty 1

## 2014-06-27 MED ORDER — GABAPENTIN 300 MG PO CAPS: 300.0000 mg | ORAL_CAPSULE | Freq: Four times a day (QID) | ORAL | Status: DC

## 2014-06-27 MED ORDER — PAROXETINE HCL 30 MG PO TABS: 30.0000 mg | ORAL_TABLET | Freq: Every day | ORAL | Status: DC

## 2014-06-27 MED ORDER — ONDANSETRON 4 MG PO TBDP
4.0000 mg | ORAL_TABLET | Freq: Three times a day (TID) | ORAL | Status: DC | PRN
  Administered 2014-06-27: 4 mg via ORAL
  Filled 2014-06-27: qty 1

## 2014-06-27 MED ORDER — CELECOXIB 200 MG PO CAPS: 200.0000 mg | ORAL_CAPSULE | Freq: Two times a day (BID) | ORAL | Status: DC

## 2014-06-27 MED ORDER — LEVOTHYROXINE SODIUM 50 MCG PO TABS: ORAL_TABLET | ORAL | Status: DC

## 2014-06-27 MED ORDER — LEVOTHYROXINE SODIUM 25 MCG PO TABS
ORAL_TABLET | ORAL | Status: AC
  Filled 2014-06-27: qty 2

## 2014-06-27 MED ORDER — CLONIDINE HCL 0.1 MG PO TABS
0.1000 mg | ORAL_TABLET | Freq: Once | ORAL | Status: AC
  Administered 2014-06-27: 0.1 mg via ORAL
  Filled 2014-06-27 (×2): qty 1
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
## 2014-06-27 NOTE — Progress Notes (Signed)
Pt d/c with her friend. All items returned.D/C instructions given and prescriptions given. Pt denies si and hi.
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
## 2014-06-27 NOTE — Progress Notes (Signed)
Pt c/o withdrawal symptoms when she woke up this morning. Gave prn medications as ordered and notified NP for additional medication for diarrhea. Pt is currently resting in her bed. Respirations even and unlabored. Pt denies si and hi. Safety maintained in the OBS unit.

## 2014-06-27 NOTE — BH Assessment (Signed)
Patient provided with Outpatient Counseling and Substance Abuse Referrals. Pt provided with crisis resources and referral information to be utilized as needed and recommended by [employer].   Shaune Pollack, MS, [place] Assessment Counselor

## 2014-06-27 NOTE — Progress Notes (Signed)
Pt demanding, needy, intrusive and medication seeking. Pt requesting staff to comb, detangle her hair:requesting meals. One additional meal given. Emotional support and encouragement given.Will continue to monitor closely and evaluate for stabilization.

## 2014-06-29 ENCOUNTER — Other Ambulatory Visit: Payer: Self-pay

## 2014-06-29 ENCOUNTER — Encounter (HOSPITAL_COMMUNITY): Payer: Self-pay | Admitting: *Deleted

## 2014-06-29 ENCOUNTER — Emergency Department (HOSPITAL_COMMUNITY)
Admission: EM | Admit: 2014-06-29 | Discharge: 2014-06-30 | Disposition: A | Discharge status: Home / Self Care | Payer: Medicare Other | Attending: Emergency Medicine | Admitting: Emergency Medicine

## 2014-06-29 DIAGNOSIS — G4730 Sleep apnea, unspecified: Secondary | ICD-10-CM | POA: Diagnosis not present

## 2014-06-29 DIAGNOSIS — Z3202 Encounter for pregnancy test, result negative: Secondary | ICD-10-CM | POA: Insufficient documentation

## 2014-06-29 DIAGNOSIS — F10120 Alcohol abuse with intoxication, uncomplicated: Secondary | ICD-10-CM | POA: Diagnosis not present

## 2014-06-29 DIAGNOSIS — Z8544 Personal history of malignant neoplasm of other female genital organs: Secondary | ICD-10-CM | POA: Diagnosis not present

## 2014-06-29 DIAGNOSIS — Z8781 Personal history of (healed) traumatic fracture: Secondary | ICD-10-CM | POA: Diagnosis not present

## 2014-06-29 DIAGNOSIS — F32A Depression, unspecified: Secondary | ICD-10-CM

## 2014-06-29 DIAGNOSIS — M1990 Unspecified osteoarthritis, unspecified site: Secondary | ICD-10-CM | POA: Diagnosis not present

## 2014-06-29 DIAGNOSIS — E039 Hypothyroidism, unspecified: Secondary | ICD-10-CM | POA: Insufficient documentation

## 2014-06-29 DIAGNOSIS — Z79899 Other long term (current) drug therapy: Secondary | ICD-10-CM | POA: Insufficient documentation

## 2014-06-29 DIAGNOSIS — F1310 Sedative, hypnotic or anxiolytic abuse, uncomplicated: Secondary | ICD-10-CM | POA: Insufficient documentation

## 2014-06-29 DIAGNOSIS — G8929 Other chronic pain: Secondary | ICD-10-CM | POA: Diagnosis not present

## 2014-06-29 DIAGNOSIS — Z862 Personal history of diseases of the blood and blood-forming organs and certain disorders involving the immune mechanism: Secondary | ICD-10-CM | POA: Insufficient documentation

## 2014-06-29 DIAGNOSIS — Z9981 Dependence on supplemental oxygen: Secondary | ICD-10-CM | POA: Diagnosis not present

## 2014-06-29 DIAGNOSIS — F10129 Alcohol abuse with intoxication, unspecified: Secondary | ICD-10-CM | POA: Diagnosis present

## 2014-06-29 DIAGNOSIS — F329 Major depressive disorder, single episode, unspecified: Secondary | ICD-10-CM | POA: Diagnosis not present

## 2014-06-29 DIAGNOSIS — E079 Disorder of thyroid, unspecified: Secondary | ICD-10-CM | POA: Insufficient documentation

## 2014-06-29 DIAGNOSIS — Z880 Allergy status to penicillin: Secondary | ICD-10-CM | POA: Insufficient documentation

## 2014-06-29 DIAGNOSIS — Z720 Tobacco use: Secondary | ICD-10-CM | POA: Diagnosis not present

## 2014-06-29 DIAGNOSIS — F419 Anxiety disorder, unspecified: Secondary | ICD-10-CM | POA: Diagnosis not present

## 2014-06-29 DIAGNOSIS — F10220 Alcohol dependence with intoxication, uncomplicated: Secondary | ICD-10-CM

## 2014-06-29 DIAGNOSIS — E669 Obesity, unspecified: Secondary | ICD-10-CM | POA: Insufficient documentation

## 2014-06-29 HISTORY — DX: Malingerer (conscious simulation): Z76.5

## 2014-06-29 HISTORY — DX: Other psychoactive substance abuse, uncomplicated: F19.10

## 2014-06-29 LAB — COMPREHENSIVE METABOLIC PANEL
ALBUMIN: 3.9 g/dL (ref 3.5–5.2)
ALT: 54 U/L — ABNORMAL HIGH (ref 0–35)
AST: 43 U/L — ABNORMAL HIGH (ref 0–37)
Alkaline Phosphatase: 64 U/L (ref 39–117)
Anion gap: 19 — ABNORMAL HIGH (ref 5–15)
BUN: 9 mg/dL (ref 6–23)
CO2: 22 mEq/L (ref 19–32)
CREATININE: 0.99 mg/dL (ref 0.50–1.10)
Calcium: 9.4 mg/dL (ref 8.4–10.5)
Chloride: 101 mEq/L (ref 96–112)
GFR calc Af Amer: 82 mL/min — ABNORMAL LOW (ref >90)
GFR calc non Af Amer: 71 mL/min — ABNORMAL LOW (ref >90)
Glucose, Bld: 86 mg/dL (ref 70–99)
Potassium: 3.9 mEq/L (ref 3.7–5.3)
Sodium: 142 mEq/L (ref 137–147)
TOTAL PROTEIN: 7.7 g/dL (ref 6.0–8.3)
Total Bilirubin: 0.3 mg/dL (ref 0.3–1.2)

## 2014-06-29 LAB — URINALYSIS, ROUTINE W REFLEX MICROSCOPIC
Bilirubin Urine: NEGATIVE
Glucose, UA: NEGATIVE mg/dL
Hgb urine dipstick: NEGATIVE
KETONES UR: NEGATIVE mg/dL
LEUKOCYTES UA: NEGATIVE
NITRITE: NEGATIVE
PH: 5 (ref 5.0–8.0)
Protein, ur: NEGATIVE mg/dL
SPECIFIC GRAVITY, URINE: 1.015 (ref 1.005–1.030)
Urobilinogen, UA: 0.2 mg/dL (ref 0.0–1.0)

## 2014-06-29 LAB — CBC WITH DIFFERENTIAL/PLATELET
Basophils Absolute: 0.1 10*3/uL (ref 0.0–0.1)
Basophils Relative: 1 % (ref 0–1)
EOS ABS: 0.2 10*3/uL (ref 0.0–0.7)
Eosinophils Relative: 2 % (ref 0–5)
HCT: 43.2 % (ref 36.0–46.0)
Hemoglobin: 14.6 g/dL (ref 12.0–15.0)
LYMPHS ABS: 2.9 10*3/uL (ref 0.7–4.0)
LYMPHS PCT: 35 % (ref 12–46)
MCH: 32.4 pg (ref 26.0–34.0)
MCHC: 33.8 g/dL (ref 30.0–36.0)
MCV: 95.8 fL (ref 78.0–100.0)
Monocytes Absolute: 0.3 10*3/uL (ref 0.1–1.0)
Monocytes Relative: 4 % (ref 3–12)
NEUTROS PCT: 58 % (ref 43–77)
Neutro Abs: 4.8 10*3/uL (ref 1.7–7.7)
Platelets: 301 10*3/uL (ref 150–400)
RBC: 4.51 MIL/uL (ref 3.87–5.11)
RDW: 13.4 % (ref 11.5–15.5)
WBC: 8.3 10*3/uL (ref 4.0–10.5)

## 2014-06-29 LAB — RAPID URINE DRUG SCREEN, HOSP PERFORMED
Amphetamines: NOT DETECTED
BENZODIAZEPINES: POSITIVE — AB
Barbiturates: NOT DETECTED
Cocaine: NOT DETECTED
Opiates: NOT DETECTED
Tetrahydrocannabinol: NOT DETECTED

## 2014-06-29 LAB — ETHANOL: ALCOHOL ETHYL (B): 358 mg/dL — AB (ref 0–11)

## 2014-06-29 LAB — ACETAMINOPHEN LEVEL

## 2014-06-29 LAB — PREGNANCY, URINE: PREG TEST UR: NEGATIVE

## 2014-06-29 LAB — SALICYLATE LEVEL: Salicylate Lvl: <2 mg/dL — ABNORMAL LOW (ref 2.8–20.0)

## 2014-06-29 MED ORDER — IBUPROFEN 200 MG PO TABS
600.0000 mg | ORAL_TABLET | Freq: Three times a day (TID) | ORAL | Status: DC | PRN
  Administered 2014-06-30: 600 mg via ORAL

## 2014-06-29 MED ORDER — MIRTAZAPINE 30 MG PO TABS
30.0000 mg | ORAL_TABLET | Freq: Every day | ORAL | Status: DC
  Administered 2014-06-29: 30 mg via ORAL

## 2014-06-29 MED ORDER — ACETAMINOPHEN 325 MG PO TABS: 650.0000 mg | ORAL_TABLET | ORAL | Status: DC | PRN

## 2014-06-29 MED ORDER — NICOTINE 21 MG/24HR TD PT24
21.0000 mg | MEDICATED_PATCH | Freq: Every day | TRANSDERMAL | Status: DC
  Administered 2014-06-29 – 2014-06-30 (×2): 21 mg via TRANSDERMAL

## 2014-06-29 MED ORDER — CELECOXIB 200 MG PO CAPS
200.0000 mg | ORAL_CAPSULE | Freq: Two times a day (BID) | ORAL | Status: DC
  Administered 2014-06-30: 200 mg via ORAL
  Filled 2014-06-29 (×3): qty 1

## 2014-06-29 MED ORDER — THIAMINE HCL 100 MG/ML IJ SOLN
Freq: Once | INTRAVENOUS | Status: AC
  Administered 2014-06-29: 18:00:00 via INTRAVENOUS
  Filled 2014-06-29: qty 1000

## 2014-06-29 MED ORDER — GABAPENTIN 300 MG PO CAPS
300.0000 mg | ORAL_CAPSULE | Freq: Four times a day (QID) | ORAL | Status: DC
  Administered 2014-06-29 – 2014-06-30 (×2): 300 mg via ORAL

## 2014-06-29 MED ORDER — VITAMIN B-1 100 MG PO TABS
100.0000 mg | ORAL_TABLET | Freq: Every day | ORAL | Status: DC
  Administered 2014-06-29 – 2014-06-30 (×2): 100 mg via ORAL

## 2014-06-29 MED ORDER — LORAZEPAM 1 MG PO TABS
0.0000 mg | ORAL_TABLET | Freq: Four times a day (QID) | ORAL | Status: DC
  Administered 2014-06-29 – 2014-06-30 (×2): 2 mg via ORAL
  Administered 2014-06-30: 1 mg via ORAL
  Filled 2014-06-29: qty 1

## 2014-06-29 MED ORDER — HYDROXYZINE HCL 25 MG PO TABS
25.0000 mg | ORAL_TABLET | Freq: Four times a day (QID) | ORAL | Status: DC | PRN
  Administered 2014-06-29 – 2014-06-30 (×3): 25 mg via ORAL

## 2014-06-29 MED ORDER — THIAMINE HCL 100 MG/ML IJ SOLN: 100.0000 mg | Freq: Every day | INTRAMUSCULAR | Status: DC

## 2014-06-29 MED ORDER — ONDANSETRON HCL 4 MG PO TABS
4.0000 mg | ORAL_TABLET | Freq: Three times a day (TID) | ORAL | Status: DC | PRN
  Administered 2014-06-30 (×2): 4 mg via ORAL

## 2014-06-29 MED ORDER — PAROXETINE HCL 30 MG PO TABS
30.0000 mg | ORAL_TABLET | Freq: Every day | ORAL | Status: DC
  Administered 2014-06-30: 30 mg via ORAL
  Filled 2014-06-29: qty 1

## 2014-06-29 MED ORDER — LORAZEPAM 1 MG PO TABS: 0.0000 mg | ORAL_TABLET | Freq: Two times a day (BID) | ORAL | Status: DC

## 2014-06-29 MED ORDER — ALUM & MAG HYDROXIDE-SIMETH 200-200-20 MG/5ML PO SUSP: 30.0000 mL | ORAL | Status: DC | PRN
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
## 2014-06-29 NOTE — ED Notes (Signed)
Bed: TX77 Expected date:  Expected time:  Means of arrival:  Comments: EMS-40 oz

## 2014-06-29 NOTE — ED Notes (Signed)
Pt is getting out of the bed to turn the lights off and shut the door when she has been asked not

## 2014-06-29 NOTE — ED Notes (Signed)
Patient refused to go to bathroom for urine sample, then jumped out of wheelchair in the hall after she agreed she would try and ran back to her room. Patient climbed out of bed and urinated in the middle of the floor and then she climbed back in bed. MD notified.
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
## 2014-06-29 NOTE — ED Notes (Addendum)
Pt continues to state she is feeling extremely anxious.  Dr Johnney Killian aware, will continue to monitor pt.  Pharmacy tech at bedside to eval pt.  Pt questioning home med dosages.

## 2014-06-29 NOTE — ED Notes (Signed)
Patient throwing linen on the floor.

## 2014-06-29 NOTE — ED Notes (Signed)
Pt very anxious presents for intoxication, admits to drinking a fifth per [place], denies street drugs.  Denies SI or HI, no AV hallucinations.  Pt cooperative at present, will continue to monitor for safety.

## 2014-06-29 NOTE — BH Assessment (Signed)
Assessment completed. Consulted Nena Polio, PA-C who recommended that pt be started on the alcohol detox protocol and Clonidine protocol. Pt should remain BAL is 250 or below. Pt will be reassessed in the am. Dr. Johnney Killian has been informed of the recommendation. Dr. Johnney Killian reported that pt is on the CI [place] protocol.

## 2014-06-29 NOTE — ED Notes (Signed)
EMS was called today by the patient stating that she was intoxicated. She was combative towards EMS and [employer] on scene. Patient was also incontinent of stool on scene. Patient has history of similar ED visits.

## 2014-06-30 ENCOUNTER — Encounter (HOSPITAL_COMMUNITY): Payer: Self-pay | Admitting: Emergency Medicine

## 2014-06-30 DIAGNOSIS — F1994 Other psychoactive substance use, unspecified with psychoactive substance-induced mood disorder: Secondary | ICD-10-CM

## 2014-06-30 DIAGNOSIS — F1010 Alcohol abuse, uncomplicated: Secondary | ICD-10-CM

## 2014-06-30 MED ORDER — TRAMADOL HCL 50 MG PO TABS
50.0000 mg | ORAL_TABLET | Freq: Two times a day (BID) | ORAL | Status: DC
  Administered 2014-06-30: 50 mg via ORAL
  Filled 2014-06-30: qty 1

## 2014-06-30 MED ORDER — PROMETHAZINE HCL 25 MG/ML IJ SOLN
12.5000 mg | Freq: Once | INTRAMUSCULAR | Status: AC
Start: 2014-06-30 — End: 2014-06-30
  Administered 2014-06-30: 12.5 mg via INTRAMUSCULAR
  Filled 2014-06-30: qty 1

## 2014-06-30 MED ORDER — PROMETHAZINE HCL 25 MG/ML IJ SOLN: 12.5000 mg | Freq: Four times a day (QID) | INTRAMUSCULAR | Status: DC | PRN

## 2014-06-30 MED ORDER — PAROXETINE HCL 20 MG PO TABS
40.0000 mg | ORAL_TABLET | Freq: Every day | ORAL | Status: DC
  Filled 2014-06-30: qty 2

## 2014-06-30 MED ORDER — LORAZEPAM 1 MG PO TABS
2.0000 mg | ORAL_TABLET | ORAL | Status: AC
  Administered 2014-06-30: 2 mg via ORAL

## 2014-06-30 MED ORDER — GABAPENTIN 400 MG PO CAPS
800.0000 mg | ORAL_CAPSULE | Freq: Four times a day (QID) | ORAL | Status: DC
  Administered 2014-06-30: 800 mg via ORAL
  Filled 2014-06-30: qty 2

## 2014-06-30 MED ORDER — LEVOTHYROXINE SODIUM 50 MCG PO TABS
50.0000 ug | ORAL_TABLET | Freq: Every day | ORAL | Status: DC
  Administered 2014-06-30: 50 ug via ORAL
  Filled 2014-06-30 (×2): qty 1

## 2014-06-30 MED ORDER — PAROXETINE HCL 10 MG PO TABS
10.0000 mg | ORAL_TABLET | Freq: Once | ORAL | Status: AC
  Administered 2014-06-30: 10 mg via ORAL
  Filled 2014-06-30: qty 1

## 2014-06-30 NOTE — ED Notes (Signed)
Up tot he bathroom to shower and change scrubs 

## 2014-06-30 NOTE — ED Notes (Signed)
Up to th edesk asking about what meds she is getting and when.

## 2014-06-30 NOTE — ED Notes (Signed)
Up to the desk asking for an "injection...dilaudid..." for her pain

## 2014-06-30 NOTE — ED Notes (Signed)
Psych staff has re-evaluated pt: pt denies SI, does not meet inpt criteria and can be d/c to home with outpt f/u. Will d/c.  Francine Graven, DO 06/30/14 1810

## 2014-06-30 NOTE — ED Notes (Addendum)
Pt has decided that she does not want to stay. NP aware

## 2014-06-30 NOTE — Discharge Instructions (Signed)
°Emergency Department Resource Guide °1) Find a Doctor and Pay Out of Pocket °Although you won't have to find out who is covered by your insurance plan, it is a good idea to ask around and get recommendations. You will then need to call the office and see if the doctor you have chosen will accept you as a new patient and what types of options they offer for patients who are self-pay. Some doctors offer discounts or will set up payment plans for their patients who do not have insurance, but you will need to ask so you aren't surprised when you get to your appointment. ° °2) Contact Your Local Health Department °Not all health departments have doctors that can see patients for sick visits, but many do, so it is worth a call to see if yours does. If you don't know where your local health department is, you can check in your phone book. The CDC also has a tool to help you locate your state's health department, and many state websites also have listings of all of their local health departments. ° °3) Find a Walk-in Clinic °If your illness is not likely to be very severe or complicated, you may want to try a walk in clinic. These are popping up all over the country in pharmacies, drugstores, and shopping centers. They're usually staffed by nurse practitioners or physician assistants that have been trained to treat common illnesses and complaints. They're usually fairly quick and inexpensive. However, if you have serious medical issues or chronic medical problems, these are probably not your best option. ° °No Primary Care Doctor: °- Call Health Connect at  832-8000 - they can help you locate a primary care doctor that  accepts your insurance, provides certain services, etc. °- Physician Referral Service- 1-800-533-3463 ° °Chronic Pain Problems: °Organization         Address  Phone   Notes  °[facility]  (336) 297-2271 Patients need to be referred by their primary care doctor.  ° °Medication  Assistance: °Organization         Address  Phone   Notes  °[place] [facility] [address] °[place], [place] [address] (336) 641-8030 --Must be a resident of [place] °-- Must have NO insurance coverage whatsoever (no Medicaid/ Medicare, etc.) °-- The pt. MUST have a primary care doctor that directs their care regularly and follows them in the community °  °[facility]  (866) 331-1348   °[employer]  (888) 892-1162   ° °Agencies that provide inexpensive medical care: °Organization         Address  Phone   Notes  ° [facility]  (336) 832-8035   °[facility]    (336) 832-7272   °[facility] [address] °[place], [place] [address] (336) 832-4777   °[facility] [address], °[place] (336) 271-4999   °[facility]    (336) 373-0678   °[facility]    (336) 272-1050   °[facility] ° [address], [place] Phone:  (336) 832-4444, Fax:  (336) 832-4440 Hours of Operation:  9 am - 6 pm, M-F.  Also accepts Medicaid/Medicare and self-pay.  °[facility] ° [address], [place] Phone: (336) 832-3150, Fax: (336) [phone]. Hours of Operation:  8:30 am - 5:30 pm, M-F.  Also accepts Medicaid and self-pay.  °[facility] [place] [address], [place] Phone: (336) 878-6027   °[facility] [address], [place], [place] (336)723-1848, Ext. 123 Mondays & Thursdays: 7-9 AM.  First 15 patients are seen on a first come, first serve basis. °  ° °Medicaid-accepting [place] Providers: ° °Organization         Address  Phone   Notes  °[facility] [address], [place] (336) 641-2100 Also accepts self-pay patients.  °[facility] [address], [place] ° (336) 856-9996   °[facility] [address], [place]  (336) 288-8857   °[facility] [address] [place] [address], [place] (336) 299-7000   °Veita Bland [address], [place]  ° (336) 373-1557 Only accepts [place] Access Medicaid patients after they have their name applied to their card.  ° °Self-Pay (no insurance) in [place]: ° °Organization         Address  Phone   Notes  °Sickle Cell Patients, [facility] [address], [place] (336) 832-1970   °[facility] [address], [place] (336) 832-4400   °[facility] ° [address], [place] (336) 992-4800   °[facility]/Dr. Osei-Bonsu ° [address] [place] [address], [place] or [address], [place] (336) 841-8500 Phone number for both [place] and [place] locations is the same.  °[facility] [address], [place] (336) 299-0000   °[facility] [address] [place] [address], [place] or [address] (336) 852-7530 °(336) 878-2260   °[facility] [address], [place] (336) 350-1642, phone; (336) 294-5005, fax Sees patients 1st and 3rd Saturday of every month.  Must not qualify for public or private insurance (i.e. Medicaid, Medicare, [place] Health Choice, Veterans' Benefits) • Household income should be no more than 200% of the poverty level •The clinic cannot treat you if you are pregnant or think you are pregnant • Sexually transmitted diseases are not treated at the clinic.  ° ° °Dental Care: °Organization         Address  Phone  Notes  °[place] [facility] [address], [place] (336) 641-6152 Accepts children up to age 21 who are enrolled in Medicaid or [place] Health Choice; pregnant women with a Medicaid card; and children who have applied for Medicaid or [place] Health Choice, but were declined, whose parents can pay a reduced fee at time of service.  °[place]  [facility] [place]  [address], [place] (336) 641-7733 Accepts children up to age 21 who are enrolled in Medicaid or [place] Health Choice; pregnant women with a Medicaid card; and children who have applied for Medicaid or [place] Health Choice, but were declined, whose parents can pay a reduced fee at time of service.  °[facility] ° [address],  (336) 641-4533 Patients are seen by appointment only. Walk-ins are not accepted. [facility] will see patients 18 years of age and older. °Monday - Tuesday (8am-5pm) °Most Wednesdays (8:30-5pm) °$30 per visit, cash only  °[facility] ° [address], [place] (336) 641-4533 Patients are seen by appointment only. Walk-ins are not accepted. [facility] will see patients 18 years of age and older. °One   Wednesday Evening (Monthly: Volunteer Based).  $30 per visit, cash only  °[facility]  (919) 537-3737 for adults; Children under age 4, call Graduate Pediatric Dentistry at (919) [phone]. Children aged 4-14, please call (919) [phone] to request a pediatric application. ° Dental services are provided in all areas of dental care including fillings, crowns and bridges, complete and partial dentures, implants, gum treatment, root canals, and extractions. Preventive care is also provided. Treatment is provided to both adults and children. °Patients are selected via a lottery and there is often a waiting list. °  °[facility] [address], °[place] ° (336) 763-8833 [email or web address] °  °[facility] [address], [place], [place] (336)723-1848, Ext. 123 Second and Fourth Thursday of each month, opens at 6:30 AM; Clinic ends at 9 AM.  Patients are seen on a first-come first-served basis, and a limited number are seen during each clinic.  ° °[facility] ° [address], [place], [place] (336) 723-7904    Eligibility Requirements °You must have lived in [place], [place], or [place] counties for at least the last three months. °  You cannot be eligible for state or federal sponsored healthcare insurance, including Veterans Administration, Medicaid, or Medicare. °  You generally cannot be eligible for healthcare insurance through your employer.  °  How to apply: °Eligibility screenings are held every Tuesday and Wednesday afternoon from 1:00 pm until 4:00 pm. You do not need an appointment for the interview!  °[facility] [address], [place], [place] 336-631-2330   °[facility]  336-342-8273   °[place]  336-703-3100   °[facility]  336-570-6415   ° °Behavioral Health Resources in the Community: °Intensive Outpatient Programs °Organization         Address  Phone  Notes  °[facility] [address], [place], [place] 336-878-6098   °[facility] [address], [place], [place] 336-832-9800   °[facility] [address], [place], [place] ° 336-882-2125   °[facility] [address],  °[place], [place] 1-800-853-5163 or 336-641-4981   °Substance Abuse Resources °Organization         Address  Phone  Notes  °[facility]  336-882-2125   °[facility]  336-784-9470   °[facility]  336-285-9073   °[facility]  336-845-3988   °Residential & Outpatient Substance Abuse Program  1-800-659-3381   °Psychological Services °Organization         Address  Phone  Notes  °[facility]  336- 832-9600   °[facility]  336- 378-7881   °[facility] [address], [place] 1-800-853-5163 or 336-641-4981   ° °Mobile Crisis Teams °Organization         Address  Phone  Notes  °[facility], Mobile Crisis Care Unit  1-877-626-1772   °Assertive °[facility] ° [address]  [place], [place] 336-834-9664   °Sharon DeEsch [address] °[place] 336-554-5454   ° °Self-Help/Support Groups °Organization         Address  Phone             Notes  °[facility] [place] - variety of support groups  336- 373-1402 Call for more information  °Narcotics Anonymous (NA), [facility] [address], °[place]  2 meetings at this location  ° °  Residential Treatment Programs Organization         Address  Phone  Notes  [facility] [address]    [place]  1-5612455373   [facility]  [address], [place] [address], [place], [place]   [place], [place] 559-527-3793 Admissions: 8am-3pm M-F  [facility] [place] [address],    [place], [place] 621-308-6578   [facility] [address] [place], [place], [place]   [facility] [address],  [place], [place]   [facility] - Intensive Outpatient [address], [place], [place]   [facility] ([address]  [place], [place] 1-617 032 2094 or (352) 324-0883   [facility] [address], [place], [place] Accepts Medicaid  [facility] [address],  [place] 1-(985)165-5490 Substance Abuse/Addiction Treatment   [facility]         Address  Phone  Notes  [facility]  949-782-5106   Domenic Schwab, PhD [address] [place] [place], [place]   (816) 580-4424 or 240 653 6708   [place], [place] 605-092-5558   [facility] [address] [place] [address] [place], [place] 331-146-5378 Insurance/Medicaid/sponsorship through [facility] and Families [address]                                    [place], [place] (660) [phone] [facility] [address], [place] [phone]    Dr. Adele Schilder  [phone]   [facility] Dept. 1) [address] [place] 2) [place] 3)  [address], [place] [phone]  [phone]   [place] [phone] or (817)[phone] (After Hours)      Take your usual prescriptions as previously directed.  Call your regular medical doctor and your mental health provider on Monday to schedule a follow up appointment within the next week.  Return to the Emergency Department immediately sooner if worsening.

## 2014-06-30 NOTE — ED Notes (Signed)
Dr Harrington Challenger into see

## 2014-06-30 NOTE — Progress Notes (Signed)
Per Dr. Harrington Challenger and [name], NP, who have evaluated this pt, pt may discharge at this time as she no longer meets criteria and can contract for safety.   Benjamine Mola, FNP-BC Pt discussed and I agree with treatment and plan Levonne Spiller MD

## 2014-06-30 NOTE — ED Notes (Signed)
Pharmacy tech at bedside to speak with pt.

## 2014-06-30 NOTE — ED Notes (Addendum)
Up c/o anxiety and nausea  requesting additional ativan, will inform MD.

## 2014-06-30 NOTE — ED Notes (Signed)
On the phone, patient is unsure if she wants to stay another night.

## 2014-06-30 NOTE — ED Notes (Signed)
NP aware of continued vomiting and her wanting to leave

## 2014-06-30 NOTE — ED Notes (Addendum)
Written dc instructions reviewed w/ patient.  Pt encouraged to follow up with AA and out patient services as directed.  Pt verbalized understanding.  Pt's significant other is here to drive her home.

## 2014-07-01 DIAGNOSIS — F329 Major depressive disorder, single episode, unspecified: Secondary | ICD-10-CM | POA: Insufficient documentation

## 2014-07-01 DIAGNOSIS — F32A Depression, unspecified: Secondary | ICD-10-CM | POA: Insufficient documentation

## 2014-07-01 DIAGNOSIS — F10220 Alcohol dependence with intoxication, uncomplicated: Secondary | ICD-10-CM | POA: Insufficient documentation
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
## 2014-07-09 ENCOUNTER — Other Ambulatory Visit: Payer: Self-pay | Admitting: Internal Medicine

## 2014-07-11 ENCOUNTER — Other Ambulatory Visit: Payer: Self-pay | Admitting: Family Medicine

## 2014-07-11 ENCOUNTER — Other Ambulatory Visit (HOSPITAL_COMMUNITY): Payer: Self-pay | Admitting: *Deleted

## 2014-07-11 NOTE — Telephone Encounter (Signed)
Received faxed refill request for Gabapentin. Prescriber named as Kerrie Buffalo, NP.  Returned fax to [place] at [facility] with note: "Please contact pt.Not a prescriber or patient in this clinic. Provider not in this clinic. " Request not filled

## 2014-07-16 ENCOUNTER — Emergency Department (HOSPITAL_COMMUNITY): Payer: Medicare Other

## 2014-07-16 ENCOUNTER — Encounter (HOSPITAL_COMMUNITY): Payer: Self-pay | Admitting: *Deleted

## 2014-07-16 ENCOUNTER — Ambulatory Visit: Payer: Self-pay | Admitting: Neurology

## 2014-07-16 ENCOUNTER — Inpatient Hospital Stay (HOSPITAL_COMMUNITY)
Admission: EM | Admit: 2014-07-16 | Discharge: 2014-07-18 | DRG: 439 | Disposition: A | Discharge status: Home / Self Care | Payer: Medicare Other | Attending: Internal Medicine | Admitting: Internal Medicine

## 2014-07-16 DIAGNOSIS — J209 Acute bronchitis, unspecified: Secondary | ICD-10-CM | POA: Diagnosis present

## 2014-07-16 DIAGNOSIS — E282 Polycystic ovarian syndrome: Secondary | ICD-10-CM | POA: Diagnosis present

## 2014-07-16 DIAGNOSIS — M13861 Other specified arthritis, right knee: Secondary | ICD-10-CM | POA: Diagnosis present

## 2014-07-16 DIAGNOSIS — K852 Alcohol induced acute pancreatitis without necrosis or infection: Secondary | ICD-10-CM | POA: Diagnosis present

## 2014-07-16 DIAGNOSIS — Z8042 Family history of malignant neoplasm of prostate: Secondary | ICD-10-CM

## 2014-07-16 DIAGNOSIS — K219 Gastro-esophageal reflux disease without esophagitis: Secondary | ICD-10-CM | POA: Diagnosis present

## 2014-07-16 DIAGNOSIS — F1010 Alcohol abuse, uncomplicated: Secondary | ICD-10-CM | POA: Diagnosis present

## 2014-07-16 DIAGNOSIS — R1011 Right upper quadrant pain: Secondary | ICD-10-CM

## 2014-07-16 DIAGNOSIS — Z880 Allergy status to penicillin: Secondary | ICD-10-CM

## 2014-07-16 DIAGNOSIS — Z720 Tobacco use: Secondary | ICD-10-CM

## 2014-07-16 DIAGNOSIS — G4730 Sleep apnea, unspecified: Secondary | ICD-10-CM | POA: Diagnosis present

## 2014-07-16 DIAGNOSIS — Z8261 Family history of arthritis: Secondary | ICD-10-CM

## 2014-07-16 DIAGNOSIS — Z7289 Other problems related to lifestyle: Secondary | ICD-10-CM

## 2014-07-16 DIAGNOSIS — K859 Acute pancreatitis without necrosis or infection, unspecified: Secondary | ICD-10-CM | POA: Diagnosis present

## 2014-07-16 DIAGNOSIS — Z6841 Body Mass Index (BMI) 40.0 and over, adult: Secondary | ICD-10-CM

## 2014-07-16 DIAGNOSIS — F1994 Other psychoactive substance use, unspecified with psychoactive substance-induced mood disorder: Secondary | ICD-10-CM | POA: Diagnosis present

## 2014-07-16 DIAGNOSIS — Z881 Allergy status to other antibiotic agents status: Secondary | ICD-10-CM

## 2014-07-16 DIAGNOSIS — C52 Malignant neoplasm of vagina: Secondary | ICD-10-CM | POA: Diagnosis present

## 2014-07-16 DIAGNOSIS — M13862 Other specified arthritis, left knee: Secondary | ICD-10-CM | POA: Diagnosis present

## 2014-07-16 DIAGNOSIS — R109 Unspecified abdominal pain: Secondary | ICD-10-CM

## 2014-07-16 DIAGNOSIS — Z833 Family history of diabetes mellitus: Secondary | ICD-10-CM

## 2014-07-16 DIAGNOSIS — G8929 Other chronic pain: Secondary | ICD-10-CM | POA: Diagnosis present

## 2014-07-16 DIAGNOSIS — F419 Anxiety disorder, unspecified: Secondary | ICD-10-CM | POA: Diagnosis present

## 2014-07-16 DIAGNOSIS — Z765 Malingerer [conscious simulation]: Secondary | ICD-10-CM | POA: Diagnosis present

## 2014-07-16 DIAGNOSIS — E039 Hypothyroidism, unspecified: Secondary | ICD-10-CM

## 2014-07-16 DIAGNOSIS — F1099 Alcohol use, unspecified with unspecified alcohol-induced disorder: Secondary | ICD-10-CM | POA: Diagnosis present

## 2014-07-16 DIAGNOSIS — Z8249 Family history of ischemic heart disease and other diseases of the circulatory system: Secondary | ICD-10-CM

## 2014-07-16 DIAGNOSIS — D649 Anemia, unspecified: Secondary | ICD-10-CM | POA: Diagnosis present

## 2014-07-16 LAB — COMPREHENSIVE METABOLIC PANEL
ALK PHOS: 90 U/L (ref 39–117)
ALT: 25 U/L (ref 0–35)
ANION GAP: 13 (ref 5–15)
AST: 22 U/L (ref 0–37)
Albumin: 3.2 g/dL — ABNORMAL LOW (ref 3.5–5.2)
BUN: 6 mg/dL (ref 6–23)
CO2: 25 mEq/L (ref 19–32)
Calcium: 8.9 mg/dL (ref 8.4–10.5)
Chloride: 101 mEq/L (ref 96–112)
Creatinine, Ser: 0.85 mg/dL (ref 0.50–1.10)
GFR calc non Af Amer: 85 mL/min — ABNORMAL LOW (ref >90)
GLUCOSE: 90 mg/dL (ref 70–99)
Potassium: 3.9 mEq/L (ref 3.7–5.3)
Sodium: 139 mEq/L (ref 137–147)
Total Bilirubin: 0.2 mg/dL — ABNORMAL LOW (ref 0.3–1.2)
Total Protein: 6.9 g/dL (ref 6.0–8.3)

## 2014-07-16 LAB — CBC WITH DIFFERENTIAL/PLATELET
BASOS PCT: 0 % (ref 0–1)
Basophils Absolute: 0 10*3/uL (ref 0.0–0.1)
EOS PCT: 4 % (ref 0–5)
Eosinophils Absolute: 0.3 10*3/uL (ref 0.0–0.7)
HEMATOCRIT: 32.6 % — AB (ref 36.0–46.0)
Hemoglobin: 10.8 g/dL — ABNORMAL LOW (ref 12.0–15.0)
LYMPHS ABS: 1 10*3/uL (ref 0.7–4.0)
Lymphocytes Relative: 13 % (ref 12–46)
MCH: 31.6 pg (ref 26.0–34.0)
MCHC: 33.1 g/dL (ref 30.0–36.0)
MCV: 95.3 fL (ref 78.0–100.0)
MONO ABS: 0.4 10*3/uL (ref 0.1–1.0)
MONOS PCT: 6 % (ref 3–12)
NEUTROS PCT: 77 % (ref 43–77)
Neutro Abs: 5.9 10*3/uL (ref 1.7–7.7)
Platelets: 121 10*3/uL — ABNORMAL LOW (ref 150–400)
RBC: 3.42 MIL/uL — AB (ref 3.87–5.11)
RDW: 14.3 % (ref 11.5–15.5)
WBC: 7.7 10*3/uL (ref 4.0–10.5)

## 2014-07-16 LAB — URINALYSIS, ROUTINE W REFLEX MICROSCOPIC
BILIRUBIN URINE: NEGATIVE
Glucose, UA: NEGATIVE mg/dL
Ketones, ur: NEGATIVE mg/dL
Leukocytes, UA: NEGATIVE
NITRITE: NEGATIVE
PROTEIN: NEGATIVE mg/dL
Specific Gravity, Urine: 1.009 (ref 1.005–1.030)
Urobilinogen, UA: 0.2 mg/dL (ref 0.0–1.0)
pH: 7.5 (ref 5.0–8.0)

## 2014-07-16 LAB — LIPASE, BLOOD: LIPASE: 119 U/L — AB (ref 11–59)

## 2014-07-16 LAB — URINE MICROSCOPIC-ADD ON

## 2014-07-16 LAB — POC URINE PREG, ED: PREG TEST UR: NEGATIVE

## 2014-07-16 MED ORDER — MORPHINE SULFATE 4 MG/ML IJ SOLN
4.0000 mg | Freq: Once | INTRAMUSCULAR | Status: AC
  Administered 2014-07-16: 4 mg via INTRAVENOUS
  Filled 2014-07-16: qty 1

## 2014-07-16 MED ORDER — SODIUM CHLORIDE 0.9 % IV SOLN
INTRAVENOUS | Status: DC
  Administered 2014-07-17 – 2014-07-18 (×2): via INTRAVENOUS

## 2014-07-16 MED ORDER — HYDROMORPHONE HCL 1 MG/ML IJ SOLN
1.0000 mg | Freq: Once | INTRAMUSCULAR | Status: AC
Start: 2014-07-16 — End: 2014-07-16
  Administered 2014-07-16: 1 mg via INTRAVENOUS
  Filled 2014-07-16: qty 1

## 2014-07-16 MED ORDER — HYDROMORPHONE HCL 1 MG/ML IJ SOLN
1.0000 mg | Freq: Once | INTRAMUSCULAR | Status: AC
  Administered 2014-07-16: 1 mg via INTRAVENOUS
  Filled 2014-07-16: qty 1

## 2014-07-16 MED ORDER — NICOTINE 14 MG/24HR TD PT24
14.0000 mg | MEDICATED_PATCH | Freq: Every day | TRANSDERMAL | Status: DC
  Administered 2014-07-16 – 2014-07-18 (×3): 14 mg via TRANSDERMAL
  Filled 2014-07-16 (×3): qty 1

## 2014-07-16 MED ORDER — PAROXETINE HCL 20 MG PO TABS
40.0000 mg | ORAL_TABLET | Freq: Every day | ORAL | Status: DC
  Administered 2014-07-17: 40 mg via ORAL
  Filled 2014-07-16 (×2): qty 2

## 2014-07-16 MED ORDER — ONDANSETRON HCL 4 MG/2ML IJ SOLN
4.0000 mg | Freq: Four times a day (QID) | INTRAMUSCULAR | Status: DC | PRN
  Administered 2014-07-17 – 2014-07-18 (×4): 4 mg via INTRAVENOUS
  Filled 2014-07-16 (×4): qty 2

## 2014-07-16 MED ORDER — MIRTAZAPINE 30 MG PO TABS
30.0000 mg | ORAL_TABLET | Freq: Every day | ORAL | Status: DC
  Administered 2014-07-17: 30 mg via ORAL
  Filled 2014-07-16 (×3): qty 1

## 2014-07-16 MED ORDER — LEVOTHYROXINE SODIUM 50 MCG PO TABS
50.0000 ug | ORAL_TABLET | Freq: Every day | ORAL | Status: DC
  Administered 2014-07-17: 50 ug via ORAL
  Filled 2014-07-16 (×3): qty 1

## 2014-07-16 MED ORDER — ONDANSETRON HCL 4 MG/2ML IJ SOLN
4.0000 mg | Freq: Once | INTRAMUSCULAR | Status: AC
  Administered 2014-07-16: 4 mg via INTRAVENOUS
  Filled 2014-07-16: qty 2

## 2014-07-16 MED ORDER — OXYCODONE HCL 5 MG PO TABS
5.0000 mg | ORAL_TABLET | ORAL | Status: DC | PRN
  Administered 2014-07-17: 5 mg via ORAL
  Filled 2014-07-16: qty 1

## 2014-07-16 MED ORDER — ACETAMINOPHEN 325 MG PO TABS: 650.0000 mg | ORAL_TABLET | Freq: Four times a day (QID) | ORAL | Status: DC | PRN

## 2014-07-16 MED ORDER — HYDROMORPHONE HCL 1 MG/ML IJ SOLN
1.5000 mg | Freq: Once | INTRAMUSCULAR | Status: AC
  Administered 2014-07-16: 1.5 mg via INTRAVENOUS
  Filled 2014-07-16: qty 2

## 2014-07-16 MED ORDER — ZOLPIDEM TARTRATE 5 MG PO TABS: 5.0000 mg | ORAL_TABLET | Freq: Every evening | ORAL | Status: DC | PRN

## 2014-07-16 MED ORDER — AZITHROMYCIN 250 MG PO TABS
250.0000 mg | ORAL_TABLET | Freq: Every day | ORAL | Status: DC
  Filled 2014-07-16: qty 1

## 2014-07-16 MED ORDER — IOHEXOL 300 MG/ML  SOLN
100.0000 mL | Freq: Once | INTRAMUSCULAR | Status: AC | PRN
  Administered 2014-07-16: 100 mL via INTRAVENOUS

## 2014-07-16 MED ORDER — IOHEXOL 300 MG/ML  SOLN: 25.0000 mL | INTRAMUSCULAR | Status: DC

## 2014-07-16 MED ORDER — ADULT MULTIVITAMIN W/MINERALS CH
1.0000 | ORAL_TABLET | Freq: Every day | ORAL | Status: DC
  Administered 2014-07-17: 1 via ORAL
  Filled 2014-07-16 (×2): qty 1

## 2014-07-16 MED ORDER — VITAMIN B-1 100 MG PO TABS
100.0000 mg | ORAL_TABLET | Freq: Every day | ORAL | Status: DC
  Administered 2014-07-17: 100 mg via ORAL
  Filled 2014-07-16 (×2): qty 1

## 2014-07-16 MED ORDER — GABAPENTIN 400 MG PO CAPS
800.0000 mg | ORAL_CAPSULE | Freq: Four times a day (QID) | ORAL | Status: DC
  Administered 2014-07-17 (×4): 800 mg via ORAL
  Filled 2014-07-16 (×9): qty 2

## 2014-07-16 MED ORDER — HYDROMORPHONE HCL 1 MG/ML IJ SOLN: 1.0000 mg | INTRAMUSCULAR | Status: DC | PRN

## 2014-07-16 MED ORDER — LORAZEPAM 2 MG/ML IJ SOLN
1.0000 mg | Freq: Once | INTRAMUSCULAR | Status: AC
  Administered 2014-07-16: 1 mg via INTRAVENOUS
  Filled 2014-07-16: qty 1

## 2014-07-16 MED ORDER — ONDANSETRON HCL 4 MG PO TABS: 4.0000 mg | ORAL_TABLET | Freq: Four times a day (QID) | ORAL | Status: DC | PRN

## 2014-07-16 MED ORDER — FOLIC ACID 1 MG PO TABS
1.0000 mg | ORAL_TABLET | Freq: Every day | ORAL | Status: DC
  Administered 2014-07-17: 1 mg via ORAL
  Filled 2014-07-16 (×2): qty 1

## 2014-07-16 MED ORDER — ACETAMINOPHEN 650 MG RE SUPP
650.0000 mg | Freq: Four times a day (QID) | RECTAL | Status: DC | PRN
Start: 2014-07-16 — End: 2014-07-18

## 2014-07-16 MED ORDER — METHOCARBAMOL 500 MG PO TABS
500.0000 mg | ORAL_TABLET | Freq: Three times a day (TID) | ORAL | Status: DC | PRN
  Filled 2014-07-16: qty 1

## 2014-07-16 MED ORDER — HEPARIN SODIUM (PORCINE) 5000 UNIT/ML IJ SOLN
5000.0000 [IU] | Freq: Three times a day (TID) | INTRAMUSCULAR | Status: DC
  Administered 2014-07-17 (×3): 5000 [IU] via SUBCUTANEOUS
  Filled 2014-07-16 (×9): qty 1

## 2014-07-16 MED ORDER — MORPHINE SULFATE 2 MG/ML IJ SOLN: 2.0000 mg | INTRAMUSCULAR | Status: DC | PRN

## 2014-07-16 MED ORDER — AZITHROMYCIN 500 MG PO TABS
500.0000 mg | ORAL_TABLET | Freq: Every day | ORAL | Status: AC
  Administered 2014-07-17: 500 mg via ORAL
  Filled 2014-07-16: qty 1

## 2014-07-16 MED ORDER — FUROSEMIDE 40 MG PO TABS
40.0000 mg | ORAL_TABLET | Freq: Two times a day (BID) | ORAL | Status: DC | PRN
  Filled 2014-07-16: qty 1

## 2014-07-16 NOTE — ED Provider Notes (Signed)
CSN: [id number]     Arrival date & time 07/16/14  1242 History   First MD Initiated Contact with Patient 07/16/14 1342     Chief Complaint  Patient presents with  . Abdominal Pain  . Emesis     (Consider location/radiation/quality/duration/timing/severity/associated sxs/prior Treatment) Patient is a 38 y.o. female presenting with abdominal pain.  Abdominal Pain Pain location:  RUQ Pain quality: shooting and stabbing   Pain radiates to:  R shoulder and epigastric region Pain severity:  Severe Onset quality:  Gradual Duration:  2 days Timing:  Constant (was intermittent then became constant last night) Progression:  Worsening Chronicity:  New Context: eating (did make it worse when it was intermittent)   Context: not previous surgeries   Relieved by:  Nothing Worsened by:  Eating Ineffective treatments:  None tried Associated symptoms: diarrhea, fever, nausea, vaginal bleeding (menstruating) and vomiting   Associated symptoms: no chest pain, no constipation, no cough, no dysuria, no hematemesis, no hematochezia, no hematuria, no shortness of breath, no sore throat and no vaginal discharge   Fever:    Duration:  2 days   Max temp PTA (F):  102.3   Temp source:  Oral   Progression:  Worsening Risk factors: has not had multiple surgeries     Past Medical History  Diagnosis Date  . Thyroid disease   . Chronic back pain   . Hypothyroidism   . Anxiety   . GERD (gastroesophageal reflux disease)   . Sleep apnea     cpap  . Headache(784.0)   . Neuromuscular disorder     neuropathy  . Arthritis     knees bilateral   DJD  stenosis   . Blood dyscrasia     HSV  . PCOS (polycystic ovarian syndrome)   . Cancer     vaginal  malignant carcinoma  . Alcohol abuse   . Drug-seeking behavior   . Polysubstance abuse    Past Surgical History  Procedure Laterality Date  . Back surgery    . Cesarean section    . Tonsillectomy    . Tubal ligation    . Fracture surgery      Family History  Problem Relation Age of Onset  . Arthritis Mother   . Clotting disorder Mother   . Hyperlipidemia Father   . Leukemia Maternal Grandmother   . Diabetes Maternal Grandfather   . Stroke Paternal Grandmother   . Heart disease Father   . Diabetes Mother   . Hypertension Mother   . Prostate cancer Father   . Heart disease Paternal Grandmother    History  Substance Use Topics  . Smoking status: Current Every [place] Smoker -- 1.00 packs/[place] for 20 years    Types: Cigarettes  . Smokeless tobacco: Never Used  . Alcohol Use: Yes     Comment: heavy 1/2 gallon vodka daily   OB History    No data available     Review of Systems  Constitutional: Positive for fever.  HENT: Negative for sore throat.   Eyes: Negative for visual disturbance.  Respiratory: Negative for cough and shortness of breath.   Cardiovascular: Negative for chest pain.  Gastrointestinal: Positive for nausea, vomiting, abdominal pain and diarrhea. Negative for constipation, hematochezia and hematemesis.  Genitourinary: Positive for vaginal bleeding (menstruating). Negative for dysuria, hematuria, vaginal discharge and difficulty urinating.  Musculoskeletal: Negative for back pain and neck pain.  Skin: Negative for rash.  Neurological: Negative for syncope and headaches.  Allergies  Penicillins and Quinolones  Home Medications   Prior to Admission medications   Medication Sig Start Date End Date Taking? Authorizing Provider  buprenorphine-naloxone (SUBOXONE) 2-0.5 MG SUBL SL tablet Place 1 tablet under the tongue daily.    Historical Provider, MD  carisoprodol (SOMA) 350 MG tablet Take 350 mg by mouth 2 (two) times daily as needed for muscle spasms.    Historical Provider, MD  celecoxib (CELEBREX) 200 MG capsule Take 1 capsule (200 mg total) by mouth 2 (two) times daily. For arthritis pain 06/27/14   Benjamine Mola, FNP  furosemide (LASIX) 40 MG tablet Take 40 mg by mouth 2 (two) times daily as  needed for fluid.    Historical Provider, MD  gabapentin (NEURONTIN) 300 MG capsule Take 1 capsule (300 mg total) by mouth 4 (four) times daily. Patient not taking: Reported on 06/30/2014 06/27/14   Benjamine Mola, FNP  gabapentin (NEURONTIN) 800 MG tablet Take 800 mg by mouth 4 (four) times daily.    Historical Provider, MD  hydrOXYzine (ATARAX/VISTARIL) 25 MG tablet Take 1 tablet (25 mg total) by mouth every 6 (six) hours as needed for anxiety. 06/27/14   Benjamine Mola, FNP  hydrOXYzine (VISTARIL) 25 MG capsule Take 25 mg by mouth every 6 (six) hours as needed for anxiety.    Historical Provider, MD  ibuprofen (ADVIL,MOTRIN) 200 MG tablet Take 800 mg by mouth every 6 (six) hours as needed for moderate pain.    Historical Provider, MD  levothyroxine (SYNTHROID, LEVOTHROID) 50 MCG tablet TAKE ONE TABLET BY MOUTH ONCE DAILY BEFORE  BREAKFAST Patient taking differently: Take 50 mcg by mouth daily before breakfast. TAKE ONE TABLET BY MOUTH ONCE DAILY BEFORE  BREAKFAST 06/27/14   Benjamine Mola, FNP  methocarbamol (ROBAXIN) 500 MG tablet Take 1 tablet (500 mg total) by mouth every 8 (eight) hours as needed for muscle spasms. 06/27/14   Benjamine Mola, FNP  mirtazapine (REMERON) 30 MG tablet Take 1 tablet (30 mg total) by mouth at bedtime. 06/27/14   Benjamine Mola, FNP  ondansetron (ZOFRAN) 8 MG tablet Take 8 mg by mouth every 8 (eight) hours as needed for nausea or vomiting.    Historical Provider, MD  Oxycodone HCl 10 MG TABS Take 5-20 mg by mouth every 8 (eight) hours as needed (pain).    Historical Provider, MD  PARoxetine (PAXIL) 30 MG tablet Take 1 tablet (30 mg total) by mouth daily. Patient not taking: Reported on 06/30/2014 06/27/14   Benjamine Mola, FNP  PARoxetine (PAXIL) 40 MG tablet TAKE ONE TABLET BY MOUTH ONCE DAILY. DUE FOR FOLLOW UP WITH DR Laney Pastor 07/10/14   Leandrew Koyanagi, MD  zolpidem (AMBIEN) 10 MG tablet Take 10 mg by mouth at bedtime as needed for sleep.    Historical Provider, MD    BP 136/72 mmHg  Pulse 102  Temp(Src) 99.8 F (37.7 C) (Oral)  Resp 20  SpO2 99%  LMP 07/16/2014 Physical Exam  Constitutional: She is oriented to person, place, and time. She appears well-developed and well-nourished. No distress.  HENT:  Head: Normocephalic and atraumatic.  Eyes: Conjunctivae and EOM are normal.  Neck: Normal range of motion.  Cardiovascular: Normal rate, regular rhythm, normal heart sounds and intact distal pulses.  Exam reveals no gallop and no friction rub.   No murmur heard. Pulmonary/Chest: Effort normal and breath sounds normal. No respiratory distress. She has no wheezes. She has no rales.  Abdominal: Soft. She exhibits no  distension. There is tenderness in the right upper quadrant and right lower quadrant. There is tenderness at McBurney's point and positive Murphy's sign. There is no guarding and no CVA tenderness.  Musculoskeletal: She exhibits no edema or tenderness.  Neurological: She is alert and oriented to person, place, and time.  Skin: Skin is warm and dry. No rash noted. She is not diaphoretic. No erythema.  Nursing note and vitals reviewed.   ED Course  Procedures (including critical care time) Labs Review Labs Reviewed  CBC WITH DIFFERENTIAL - Abnormal; Notable for the following:    RBC 3.42 (*)    Hemoglobin 10.8 (*)    HCT 32.6 (*)    Platelets 121 (*)    All other components within normal limits  COMPREHENSIVE METABOLIC PANEL  LIPASE, BLOOD  URINALYSIS, ROUTINE W REFLEX MICROSCOPIC  POC URINE PREG, ED    Imaging Review No results found.   EKG Interpretation None      MDM   Final diagnoses:  None   38 year old female with history of chronic back pain, mood disorder, hypothyroidism, alcohol dependence, polysubstance dependence presents with concern for right upper quadrant abdominal pain for 2 days, nausea and vomiting.  Differential diagnoses include cholecystitis hepatitis pancreatitis nephrolithiasis, appendicitis.   Point-of-care test pregnancy is negative, I have low suspicion for pelvic causes abdominal pain given the location described his symptoms and no vaginal discharge. History of right upper quadrant abdominal pain initially worse with food and and now constant with right upper quadrant tenderness and low grade fever 100.4 on exam is most concerning for acute cholecystitis.  Ultrasound is currently pending at time of transfer care to Dr. Oran Rein. If the ultrasound does not show signs of cholecystitis would recommend a CT abdomen and pelvis to evaluate for appendicitis.  Lipase is elevated however not to the point we would consider pancreatitis. Urinalysis shows hematuria and patient notes that she is currently menstruating.    Patient was given Zofran and morphine and Dilaudid for pain. Care transferred to Dr. Rolene Arbour with results the ultrasound pending.  Alvino Chapel, MD 07/16/14 [place], MD 07/23/14 [phone]

## 2014-07-16 NOTE — ED Notes (Addendum)
Pt reports having abd pain, more severe on right side. Having fever, n/v/d. Feels like abd is bloated, has shooting pain from right side of abd and into left side.

## 2014-07-16 NOTE — ED Provider Notes (Signed)
Assumption of Care Note:  I assumed care of this patient at 16:00 from Dr. Billy Fischer, please see her note for more detail. Briefly, this pt is a 38 y.o. F who presents for intermittent RUQ epigastric pain worse when eating. On and off for long time but became constant last not. RUQ and RLQ pain on exam. Plan is to follow up ordered RUQ [place], if + for cholecystitis will contact surgery. If negative will order CT abd/pelvis with contrast.   RUQ US unremarkable. CT abd/pelvis consistent with pancreatitis. Patient receives multiple doses of IV narcotics in the ED but still is unable to tolerate PO and says her pain is poorly controlled. Patient admitted for further management.  Patient seen with attending, Dr. Doy Mince, who oversaw clinical decision making.   Margaretann Loveless, MD 07/16/14 9244  Artis Delay, MD 07/17/14 [phone]

## 2014-07-16 NOTE — H&P (Addendum)
[facility] History and Physical  [name] ALP:[id number] DOB: 1976-03-01 DOA: 07/16/2014  Referring physician: Margaretann Loveless, MD PCP: Leandrew Koyanagi, MD   Chief Complaint: Pancreatitis  HPI: [name] is a 38 y.o. female presents with abdominal pain. She states that she has had the pain for 3 days but now over the last 24 hours it has been constant. She states the pain has been going to her back and side. She states she has had vomiting and also nausea. She has no blood in her vomit. She has noted a chest cold also. Patient states that she is coughing up yellow green sputum. She states that she does smoke. She was requesting some codeine or percocet for the cough along with dilaudid for her belly pain. She does not drink presently. She states she does not use any drugs.   Review of Systems:  Constitutional:  No weight loss, night sweats, ++Fevers, chills, +fatigue.  HEENT:  No headaches, Difficulty swallowing,Tooth/dental problems Cardio-vascular:  No chest pain, Orthopnea, PND, palpitations  GI:  ++abdominal pain, ++nausea, ++vomiting, +diarrhea, ++change in bowel habits, ++loss of appetite  Resp:  No excess mucus, ++cough noted.No change in color of mucus Skin:  no rash or lesions GU:  no dysuria, change in color of urine, no urgency or frequency Musculoskeletal:  No joint pain or swelling. No decreased range of motion Psych:  No change in mood or affect. No depression or anxiety  Past Medical History  Diagnosis Date  . Thyroid disease   . Chronic back pain   . Hypothyroidism   . Anxiety   . GERD (gastroesophageal reflux disease)   . Sleep apnea     cpap  . Headache(784.0)   . Neuromuscular disorder     neuropathy  . Arthritis     knees bilateral   DJD  stenosis   . Blood dyscrasia     HSV  . PCOS (polycystic ovarian syndrome)   . Cancer     vaginal  malignant carcinoma  . Alcohol abuse   . Drug-seeking behavior   . Polysubstance abuse     Past Surgical History  Procedure Laterality Date  . Back surgery    . Cesarean section    . Tonsillectomy    . Tubal ligation    . Fracture surgery     Social History:  reports that she has been smoking Cigarettes.  She has a 20 pack-year smoking history. She has never used smokeless tobacco. She reports that she drinks alcohol. She reports that she uses illicit drugs.  Allergies  Allergen Reactions  . Penicillins Hives  . Quinolones Hives    Hives with moxifloxacin and levofloxacin    Family History  Problem Relation Age of Onset  . Arthritis Mother   . Clotting disorder Mother   . Hyperlipidemia Father   . Leukemia Maternal Grandmother   . Diabetes Maternal Grandfather   . Stroke Paternal Grandmother   . Heart disease Father   . Diabetes Mother   . Hypertension Mother   . Prostate cancer Father   . Heart disease Paternal Grandmother      Prior to Admission medications   Medication Sig Start Date End Date Taking? Authorizing Provider  furosemide (LASIX) 40 MG tablet Take 40 mg by mouth 2 (two) times daily as needed for fluid.   Yes Historical Provider, MD  gabapentin (NEURONTIN) 400 MG capsule Take 800 mg by mouth 4 (four) times daily.   Yes Historical Provider, MD  ibuprofen (  ADVIL,MOTRIN) 800 MG tablet Take 800 mg by mouth every 8 (eight) hours as needed for mild pain.   Yes Historical Provider, MD  levothyroxine (SYNTHROID, LEVOTHROID) 50 MCG tablet TAKE ONE TABLET BY MOUTH ONCE DAILY BEFORE  BREAKFAST Patient taking differently: Take 50 mcg by mouth daily before breakfast. TAKE ONE TABLET BY MOUTH ONCE DAILY BEFORE  BREAKFAST 06/27/14  Yes Benjamine Mola, FNP  methocarbamol (ROBAXIN) 500 MG tablet Take 1 tablet (500 mg total) by mouth every 8 (eight) hours as needed for muscle spasms. 06/27/14  Yes Benjamine Mola, FNP  mirtazapine (REMERON) 30 MG tablet Take 1 tablet (30 mg total) by mouth at bedtime. 06/27/14  Yes Benjamine Mola, FNP  PARoxetine (PAXIL) 40 MG tablet  Take 40 mg by mouth every morning.   Yes Historical Provider, MD  ranitidine (ZANTAC) 300 MG tablet Take 300 mg by mouth at bedtime.   Yes Historical Provider, MD  zolpidem (AMBIEN) 10 MG tablet Take 10 mg by mouth at bedtime as needed for sleep.   Yes Historical Provider, MD  hydrOXYzine (ATARAX/VISTARIL) 25 MG tablet Take 1 tablet (25 mg total) by mouth every 6 (six) hours as needed for anxiety. 06/27/14   Benjamine Mola, FNP   Physical Exam: Filed Vitals:   07/16/14 2000 07/16/14 2015 07/16/14 2200 07/16/14 2215  BP: 117/63 117/40 127/73 115/72  Pulse: 81 88 83 92  Temp:      TempSrc:      Resp:      SpO2: 99% 97% 96% 98%    Wt Readings from Last 3 Encounters:  06/26/14 149.687 kg (330 lb)  06/07/14 140.615 kg (310 lb)  05/03/14 144.244 kg (318 lb)    General:  Appears calm and comfortable Eyes: PERRL, normal lids, irises & conjunctiva ENT: grossly normal hearing, lips & tongue Neck: no LAD, masses or thyromegaly Cardiovascular: RRR, no m/r/g. No LE edema Respiratory: CTA bilaterally, no w/r/r. Normal respiratory effort. Abdomen: soft, ntnd Skin: no rash or induration seen on limited exam Musculoskeletal: grossly normal tone BUE/BLE Psychiatric: grossly normal mood and affect, speech fluent and appropriate Neurologic: grossly non-focal          Labs on Admission:  Basic Metabolic Panel:  Recent Labs Lab 07/16/14 1330  NA 139  K 3.9  CL 101  CO2 25  GLUCOSE 90  BUN 6  CREATININE 0.85  CALCIUM 8.9   Liver Function Tests:  Recent Labs Lab 07/16/14 1330  AST 22  ALT 25  ALKPHOS 90  BILITOT 0.2*  PROT 6.9  ALBUMIN 3.2*    Recent Labs Lab 07/16/14 1330  LIPASE 119*   No results for input(s): AMMONIA in the last 168 hours. CBC:  Recent Labs Lab 07/16/14 1330  WBC 7.7  NEUTROABS 5.9  HGB 10.8*  HCT 32.6*  MCV 95.3  PLT 121*   Cardiac Enzymes: No results for input(s): CKTOTAL, CKMB, CKMBINDEX, TROPONINI in the last 168 hours.  BNP (last 3  results)  Recent Labs  03/26/14 1954  PROBNP 15.7   CBG: No results for input(s): GLUCAP in the last 168 hours.  Radiological Exams on Admission: Ct Abdomen Pelvis W Contrast  07/16/2014   CLINICAL DATA:  Right-sided abdominal pain, fever, abdominal bloating. History of vaginal cancer.  EXAM: CT ABDOMEN AND PELVIS WITH CONTRAST  TECHNIQUE: Multidetector CT imaging of the abdomen and pelvis was performed using the standard protocol following bolus administration of intravenous contrast.  CONTRAST:  193mL OMNIPAQUE IOHEXOL 300 MG/ML  SOLN  COMPARISON:  04/10/2013  FINDINGS: Trace pleural fluid is noted with adjacent dependent presumed atelectasis. Stable nodularity abutting the left hemidiaphragm, image 11.  Geographic probable focal fat noted within the medial segment left hepatic lobe measuring 1.4 cm image 24. No intra or extrahepatic ductal dilatation. Gallbladder is unremarkable. Adrenal glands and kidneys are unremarkable. 0.9 cm hypodense splenic lesion is reidentified, not further characterized but statistically most likely hemangioma or lymphangioma. Pancreas is diffusely fatty infiltrated, with internal fat stranding, global enlargement, and a small amount of peripancreatic fluid. Probable splenule reidentified image 31.  No free air. No lymphadenopathy. Small retroperitoneal nodes measure 5 mm and smaller. Normal appendix. No bowel wall thickening or focal segmental dilatation is identified.  Uterus and ovaries are normal.  The bladder is normal.  No acute osseous abnormality. L5-S1 fusion hardware reidentified. No evidence for hardware failure.  IMPRESSION: Pancreatic enlargement, internal fat stranding, and peripancreatic fluid suggesting pancreatitis. No complicating features such as pseudocyst formation or ductal dilatation.   Electronically Signed   By: Conchita Paris M.D.   On: 07/16/2014 21:50   US Abdomen Limited Ruq  07/16/2014   CLINICAL DATA:  Right upper quadrant abdominal  pain.  EXAM: US ABDOMEN LIMITED - RIGHT UPPER QUADRANT  COMPARISON:  None.  FINDINGS: Gallbladder:  No gallstones or wall thickening visualized. No sonographic Murphy sign noted.  Common bile duct:  Diameter: 4 mm  Liver:  Hepatic increased echogenicity likely indicates underlying steatosis without focal abnormality or intrahepatic ductal dilatation.  IMPRESSION: Hepatic increased echogenicity likely indicates underlying steatosis without focal abnormality or intrahepatic ductal dilatation. No acute abnormality.   Electronically Signed   By: Conchita Paris M.D.   On: 07/16/2014 18:04     Assessment/Plan Principal Problem:   Pancreatitis Active Problems:   Hypothyroidism   Acute pancreatitis   1. Acute pancreatitis -will rest her bowels keep NPO -pain control -IV fluids with normal saline ordered  2. Hypothyroidism -continue with home medications -check TSH  3. Pain Management -she was requesting multiple drugs to control her pain -will monitor response on current regimen -?drug seeking behavior  4. Acute bronchitis -she was requesting codeine for her cough -will start on azithromycin oral for acute bronchitis  5. Anemia -will get iron studies   Code Status: Full Code (must indicate code status--if unknown or must be presumed, indicate so) DVT Prophylaxis:Heparin Family Communication: None (indicate person spoken with, if applicable, with phone number if by telephone) Disposition Plan: Home (indicate anticipated LOS)  Time spent: 92min  Benay Pomeroy A [facility] Pager [phone]

## 2014-07-17 DIAGNOSIS — F1010 Alcohol abuse, uncomplicated: Secondary | ICD-10-CM

## 2014-07-17 DIAGNOSIS — E039 Hypothyroidism, unspecified: Secondary | ICD-10-CM | POA: Diagnosis present

## 2014-07-17 DIAGNOSIS — Z8249 Family history of ischemic heart disease and other diseases of the circulatory system: Secondary | ICD-10-CM | POA: Diagnosis not present

## 2014-07-17 DIAGNOSIS — C52 Malignant neoplasm of vagina: Secondary | ICD-10-CM | POA: Diagnosis present

## 2014-07-17 DIAGNOSIS — D649 Anemia, unspecified: Secondary | ICD-10-CM | POA: Diagnosis present

## 2014-07-17 DIAGNOSIS — F1994 Other psychoactive substance use, unspecified with psychoactive substance-induced mood disorder: Secondary | ICD-10-CM | POA: Diagnosis present

## 2014-07-17 DIAGNOSIS — K219 Gastro-esophageal reflux disease without esophagitis: Secondary | ICD-10-CM | POA: Diagnosis present

## 2014-07-17 DIAGNOSIS — K852 Alcohol induced acute pancreatitis: Principal | ICD-10-CM

## 2014-07-17 DIAGNOSIS — Z8042 Family history of malignant neoplasm of prostate: Secondary | ICD-10-CM | POA: Diagnosis not present

## 2014-07-17 DIAGNOSIS — M13861 Other specified arthritis, right knee: Secondary | ICD-10-CM | POA: Diagnosis present

## 2014-07-17 DIAGNOSIS — Z7289 Other problems related to lifestyle: Secondary | ICD-10-CM | POA: Diagnosis not present

## 2014-07-17 DIAGNOSIS — G4730 Sleep apnea, unspecified: Secondary | ICD-10-CM | POA: Diagnosis present

## 2014-07-17 DIAGNOSIS — F419 Anxiety disorder, unspecified: Secondary | ICD-10-CM | POA: Diagnosis present

## 2014-07-17 DIAGNOSIS — Z880 Allergy status to penicillin: Secondary | ICD-10-CM | POA: Diagnosis not present

## 2014-07-17 DIAGNOSIS — Z6841 Body Mass Index (BMI) 40.0 and over, adult: Secondary | ICD-10-CM | POA: Diagnosis not present

## 2014-07-17 DIAGNOSIS — J209 Acute bronchitis, unspecified: Secondary | ICD-10-CM | POA: Diagnosis present

## 2014-07-17 DIAGNOSIS — G8929 Other chronic pain: Secondary | ICD-10-CM | POA: Diagnosis present

## 2014-07-17 DIAGNOSIS — R1011 Right upper quadrant pain: Secondary | ICD-10-CM | POA: Diagnosis present

## 2014-07-17 DIAGNOSIS — F1099 Alcohol use, unspecified with unspecified alcohol-induced disorder: Secondary | ICD-10-CM | POA: Diagnosis present

## 2014-07-17 DIAGNOSIS — E282 Polycystic ovarian syndrome: Secondary | ICD-10-CM | POA: Diagnosis present

## 2014-07-17 DIAGNOSIS — Z8261 Family history of arthritis: Secondary | ICD-10-CM | POA: Diagnosis not present

## 2014-07-17 DIAGNOSIS — Z720 Tobacco use: Secondary | ICD-10-CM | POA: Diagnosis not present

## 2014-07-17 DIAGNOSIS — Z881 Allergy status to other antibiotic agents status: Secondary | ICD-10-CM | POA: Diagnosis not present

## 2014-07-17 DIAGNOSIS — M13862 Other specified arthritis, left knee: Secondary | ICD-10-CM | POA: Diagnosis present

## 2014-07-17 DIAGNOSIS — Z833 Family history of diabetes mellitus: Secondary | ICD-10-CM | POA: Diagnosis not present

## 2014-07-17 LAB — COMPREHENSIVE METABOLIC PANEL
ALBUMIN: 3 g/dL — AB (ref 3.5–5.2)
ALT: 21 U/L (ref 0–35)
ANION GAP: 10 (ref 5–15)
AST: 22 U/L (ref 0–37)
Alkaline Phosphatase: 73 U/L (ref 39–117)
BILIRUBIN TOTAL: 0.3 mg/dL (ref 0.3–1.2)
BUN: <5 mg/dL — ABNORMAL LOW (ref 6–23)
CO2: 22 mmol/L (ref 19–32)
CREATININE: 0.84 mg/dL (ref 0.50–1.10)
Calcium: 8.3 mg/dL — ABNORMAL LOW (ref 8.4–10.5)
Chloride: 106 mEq/L (ref 96–112)
GFR calc non Af Amer: 86 mL/min — ABNORMAL LOW (ref >90)
Glucose, Bld: 88 mg/dL (ref 70–99)
Potassium: 3.8 mmol/L (ref 3.5–5.1)
Sodium: 138 mmol/L (ref 135–145)
Total Protein: 6 g/dL (ref 6.0–8.3)

## 2014-07-17 LAB — CBC
HCT: 33.3 % — ABNORMAL LOW (ref 36.0–46.0)
Hemoglobin: 11.1 g/dL — ABNORMAL LOW (ref 12.0–15.0)
MCH: 33.1 pg (ref 26.0–34.0)
MCHC: 33.3 g/dL (ref 30.0–36.0)
MCV: 99.4 fL (ref 78.0–100.0)
PLATELETS: 126 10*3/uL — AB (ref 150–400)
RBC: 3.35 MIL/uL — AB (ref 3.87–5.11)
RDW: 14.8 % (ref 11.5–15.5)
WBC: 7 10*3/uL (ref 4.0–10.5)

## 2014-07-17 LAB — IRON AND TIBC
Iron: 14 ug/dL — ABNORMAL LOW (ref 42–135)
Saturation Ratios: 4 % — ABNORMAL LOW (ref 20–55)
TIBC: 330 ug/dL (ref 250–470)
UIBC: 316 ug/dL (ref 125–400)

## 2014-07-17 LAB — GLUCOSE, CAPILLARY: Glucose-Capillary: 86 mg/dL (ref 70–99)

## 2014-07-17 LAB — HEMOGLOBIN A1C
HEMOGLOBIN A1C: 5.3 % (ref <5.7)
Mean Plasma Glucose: 105 mg/dL (ref <117)

## 2014-07-17 LAB — FERRITIN: FERRITIN: 61 ng/mL (ref 10–291)

## 2014-07-17 LAB — TSH: TSH: 7.025 u[IU]/mL — ABNORMAL HIGH (ref 0.350–4.500)

## 2014-07-17 LAB — OCCULT BLOOD, POC DEVICE: FECAL OCCULT BLD: POSITIVE — AB

## 2014-07-17 MED ORDER — HYDROMORPHONE HCL 1 MG/ML IJ SOLN
1.0000 mg | INTRAMUSCULAR | Status: DC | PRN
  Administered 2014-07-17 (×4): 1 mg via INTRAVENOUS
  Filled 2014-07-17 (×4): qty 1

## 2014-07-17 MED ORDER — DEXTROSE 5 % IV SOLN
250.0000 mg | INTRAVENOUS | Status: DC
  Administered 2014-07-17: 250 mg via INTRAVENOUS
  Filled 2014-07-17 (×2): qty 250

## 2014-07-17 MED ORDER — HYDROMORPHONE HCL 1 MG/ML IJ SOLN
1.0000 mg | INTRAMUSCULAR | Status: DC | PRN
  Administered 2014-07-17 (×3): 1.5 mg via INTRAVENOUS
  Administered 2014-07-17 (×2): 1 mg via INTRAVENOUS
  Administered 2014-07-17: 1.5 mg via INTRAVENOUS
  Administered 2014-07-17: 1 mg via INTRAVENOUS
  Administered 2014-07-18 (×2): 1.5 mg via INTRAVENOUS
  Administered 2014-07-18 (×2): 1 mg via INTRAVENOUS
  Filled 2014-07-17: qty 2
  Filled 2014-07-17 (×2): qty 1
  Filled 2014-07-17: qty 2
  Filled 2014-07-17 (×2): qty 1
  Filled 2014-07-17: qty 2
  Filled 2014-07-17: qty 1
  Filled 2014-07-17 (×3): qty 2

## 2014-07-17 MED ORDER — ZOLPIDEM TARTRATE 5 MG PO TABS
5.0000 mg | ORAL_TABLET | Freq: Every evening | ORAL | Status: DC | PRN
  Administered 2014-07-17: 5 mg via ORAL
  Filled 2014-07-17: qty 1

## 2014-07-17 NOTE — Progress Notes (Signed)
RN at bedside. Pt was nodding off when RN entered the room. Pt woke up and asked for her oxy and ativan. RN informed pt that the ativan was a one time dose given to her in the ED.

## 2014-07-17 NOTE — Progress Notes (Signed)
Pt complaining of pain. RN told pt that she has been asleep every time she comes in the room. Pt stated "well im not asleep now". RN stated that she is sedated and the RN does not feel comfortable giving her any pain medicine. RN walks to the front and pt is seen on camera nodding off.

## 2014-07-17 NOTE — Progress Notes (Signed)
Pt asked for IV dilaudid. RN went into the room at 0437 and the pt was snoring.

## 2014-07-17 NOTE — Progress Notes (Signed)
Went into patient's room because bed alarm off. Pt stated: "can I get my pain medicine. My nurse came in before and I was talking in my sleep so I was talking crazy but I'm not sedated or anything" I explained to the patient that I would inform her nurse of her statements and her request.

## 2014-07-17 NOTE — Progress Notes (Signed)
Pt called out for dilaudid. RN went into room and pt was snoring.

## 2014-07-17 NOTE — Progress Notes (Signed)
Pt called out for dilaudid. RN went into room right away and pt was snoring.

## 2014-07-17 NOTE — Progress Notes (Signed)
Pt called out asking for pain medicine. Pt was observed on camera nodding and falling asleep. RN at bedside to place side rail and bed alarm. Pt woke up and stated her belly was cramping and asked for her oxy PO. RN left the room.

## 2014-07-17 NOTE — Progress Notes (Signed)
Pt has arrived to the floor. She is asking for her pain medication to be changed. First she asked for the diluadid and morphine administered at the same time. Pt educated by RN and told her it is against hospital policy. She then asked for the dilaudid to be increased to 1.5mg  every 2 hours. She also asked for her morphine to be discontinued because it did not help her pain. Dr. Maudie Mercury notified, verbal orders given for dilaudid 1mg  Q2 and the morphine was discontinued. She also asked for her Ambien to be increased to 10mg . Per hospital protocol woman can only receive 5mg  of Ambien. Pt educated.

## 2014-07-17 NOTE — Progress Notes (Signed)
Pt refused for RN to put up side rails.

## 2014-07-17 NOTE — Progress Notes (Signed)
PROGRESS NOTE  [name] Glassman Fischman BDZ:[id number] DOB: [birth year] DOA: 07/16/2014 PCP: Leandrew Koyanagi, MD  HPI: [name] is a 38 y.o. female presents with abdominal pain. She states that she has had the pain for 3 days but now over the last 24 hours it has been constant. She states the pain has been going to her back and side. She states she has had vomiting and also nausea. She has no blood in her vomit. She has noted a chest cold also. Patient states that she is coughing up yellow green sputum. She states that she does smoke. She was requesting some codeine or percocet for the cough along with dilaudid for her belly pain. She does not drink presently. She states she does not use any drugs.  Subjective / 24 H Interval events Patient comfortable when I enter the room and snoring loudly, pain 9 out of 10 upon waking up  Assessment/Plan: Principal Problem:   Pancreatitis Active Problems:   Hypothyroidism   Acute pancreatitis  Acute pancreatitis - As evidenced by elevated lipase, typical symptoms as well as imaging findings consistent with acute pancreatitis - Patient with ongoing abdominal pain requiring IV pain medications, we'll keep her nothing by mouth. - IV fluids with normal saline ordered - This is likely due to recent alcohol abuse, right upper quadrant ultrasound without any gallbladder pathology  Hypothyroidism - continue with home medications -  TSH slightly abnormal, however this may be in setting of an acute illness, recommended repeat TSH in 3-4 weeks after she leaves hospital.   Pain Management - she was requesting multiple drugs to control her pain - will monitor response on current regimen - ?drug seeking behavior, patient very focused on her pain control rather than the underlying problem, she tells me she has not had any pain medications/narcotics at home for a while, however [facility] prescription monitoring program shows intermittent use of narcotic pain  medications, most recently 06/20/2014.  - She is asking for benzodiazepines and muscle relaxants on top of her IV Dilaudid. I counseled her against using this type of combination, we'll not start benzodiazepines and will discontinue her muscle relaxants, as well as discontinue her oral oxycodone as long as she is nothing by mouth.  - She seems to be quite comfortable, sleeping, however states that she wakes up every 15 minutes or so in excruciating pain   Alcohol abuse, in remission - She was hospitalized at behavioral health earlier this month, she states she is sober.  Acute bronchitis - she was requesting codeine for her cough - will start on azithromycin oral for acute bronchitis  Anemia -will get iron studies   Diet: Diet NPO time specified Fluids: NS DVT Prophylaxis: heparin  Code Status: Full Code Family Communication: none   Disposition Plan: inpatient, home when ready   Consultants:  None   Procedures:  None    Antibiotics  Anti-infectives    Start     Dose/Rate Route Frequency Ordered Stop   07/17/14 2200  azithromycin (ZITHROMAX) tablet 250 mg     250 mg Oral Daily at bedtime 07/16/14 2355 07/21/14 2159   07/17/14 0000  azithromycin (ZITHROMAX) tablet 500 mg     500 mg Oral Daily 07/16/14 2355 07/17/14 0038       Studies  Ct Abdomen Pelvis W Contrast  07/16/2014   CLINICAL DATA:  Right-sided abdominal pain, fever, abdominal bloating. History of vaginal cancer.  EXAM: CT ABDOMEN AND PELVIS WITH CONTRAST  TECHNIQUE: Multidetector CT imaging of  the abdomen and pelvis was performed using the standard protocol following bolus administration of intravenous contrast.  CONTRAST:  119mL OMNIPAQUE IOHEXOL 300 MG/ML  SOLN  COMPARISON:  04/10/2013  FINDINGS: Trace pleural fluid is noted with adjacent dependent presumed atelectasis. Stable nodularity abutting the left hemidiaphragm, image 11.  Geographic probable focal fat noted within the medial segment left hepatic  lobe measuring 1.4 cm image 24. No intra or extrahepatic ductal dilatation. Gallbladder is unremarkable. Adrenal glands and kidneys are unremarkable. 0.9 cm hypodense splenic lesion is reidentified, not further characterized but statistically most likely hemangioma or lymphangioma. Pancreas is diffusely fatty infiltrated, with internal fat stranding, global enlargement, and a small amount of peripancreatic fluid. Probable splenule reidentified image 31.  No free air. No lymphadenopathy. Small retroperitoneal nodes measure 5 mm and smaller. Normal appendix. No bowel wall thickening or focal segmental dilatation is identified.  Uterus and ovaries are normal.  The bladder is normal.  No acute osseous abnormality. L5-S1 fusion hardware reidentified. No evidence for hardware failure.  IMPRESSION: Pancreatic enlargement, internal fat stranding, and peripancreatic fluid suggesting pancreatitis. No complicating features such as pseudocyst formation or ductal dilatation.   Electronically Signed   By: Conchita Paris M.D.   On: 07/16/2014 21:50   US Abdomen Limited Ruq  07/16/2014   CLINICAL DATA:  Right upper quadrant abdominal pain.  EXAM: US ABDOMEN LIMITED - RIGHT UPPER QUADRANT  COMPARISON:  None.  FINDINGS: Gallbladder:  No gallstones or wall thickening visualized. No sonographic Murphy sign noted.  Common bile duct:  Diameter: 4 mm  Liver:  Hepatic increased echogenicity likely indicates underlying steatosis without focal abnormality or intrahepatic ductal dilatation.  IMPRESSION: Hepatic increased echogenicity likely indicates underlying steatosis without focal abnormality or intrahepatic ductal dilatation. No acute abnormality.   Electronically Signed   By: Conchita Paris M.D.   On: 07/16/2014 18:04     Objective  Filed Vitals:   07/16/14 2326 07/17/14 0002 07/17/14 0051 07/17/14 0509  BP: 128/70 128/80  141/78  Pulse: 88 94  92  Temp:  98.9 F (37.2 C)  98.6 F (37 C)  TempSrc:  Oral  Oral  Resp:   20  16  Height:  5' 7.5" (1.715 m)    Weight:   148.916 kg (328 lb 4.8 oz)   SpO2: 94% 96%  97%   No intake or output data in the 24 hours ending 07/17/14 0817 Filed Weights   07/17/14 0051  Weight: 148.916 kg (328 lb 4.8 oz)    Exam:  General:no acute distress, snoring loudly when I entered the room, wakes up easily however tends to fall asleep during our conversation.   Cardiovascular: RRR no MRG  Respiratory: CTA biL  Abdomen: epigastric tenderness  MSK: no edema  Neuro: non focal  Data Reviewed: Basic Metabolic Panel:  Recent Labs Lab 07/16/14 1330 07/17/14 0605  NA 139 138  K 3.9 3.8  CL 101 106  CO2 25 22  GLUCOSE 90 88  BUN 6 <5*  CREATININE 0.85 0.84  CALCIUM 8.9 8.3*   Liver Function Tests:  Recent Labs Lab 07/16/14 1330 07/17/14 0605  AST 22 22  ALT 25 21  ALKPHOS 90 73  BILITOT 0.2* 0.3  PROT 6.9 6.0  ALBUMIN 3.2* 3.0*    Recent Labs Lab 07/16/14 1330  LIPASE 119*   No results for input(s): AMMONIA in the last 168 hours. CBC:  Recent Labs Lab 07/16/14 1330 07/17/14 0605  WBC 7.7 7.0  NEUTROABS 5.9  --  HGB 10.8* 11.1*  HCT 32.6* 33.3*  MCV 95.3 99.4  PLT 121* 126*   Cardiac Enzymes: No results for input(s): CKTOTAL, CKMB, CKMBINDEX, TROPONINI in the last 168 hours. BNP (last 3 results)  Recent Labs  03/26/14 1954  PROBNP 15.7   CBG:  Recent Labs Lab 07/17/14 0806  GLUCAP 86    No results found for this or any previous visit (from the past 240 hour(s)).   Scheduled Meds: . azithromycin  250 mg Oral QHS  . folic acid  1 mg Oral Daily  . gabapentin  800 mg Oral QID  . heparin  5,000 Units Subcutaneous 3 times per [place]  . levothyroxine  50 mcg Oral QAC breakfast  . mirtazapine  30 mg Oral QHS  . multivitamin with minerals  1 tablet Oral Daily  . nicotine  14 mg Transdermal Daily  . PARoxetine  40 mg Oral Daily  . thiamine  100 mg Oral Daily   Continuous Infusions: . sodium chloride 75 mL/hr at 07/17/14  0025    Time spent: 35 minutes  Marzetta Board, MD [facility] Pager [phone]. If 7 PM - 7 AM, please contact night-coverage at [email or web address], password [facility] 07/17/2014, 8:17 AM  LOS: 1 [place]

## 2014-07-17 NOTE — Progress Notes (Signed)
UR completed 

## 2014-07-18 DIAGNOSIS — K852 Alcohol induced acute pancreatitis without necrosis or infection: Secondary | ICD-10-CM | POA: Diagnosis present

## 2014-07-18 DIAGNOSIS — Z765 Malingerer [conscious simulation]: Secondary | ICD-10-CM | POA: Diagnosis present

## 2014-07-18 DIAGNOSIS — F1994 Other psychoactive substance use, unspecified with psychoactive substance-induced mood disorder: Secondary | ICD-10-CM

## 2014-07-18 DIAGNOSIS — F1910 Other psychoactive substance abuse, uncomplicated: Secondary | ICD-10-CM

## 2014-07-18 DIAGNOSIS — F1010 Alcohol abuse, uncomplicated: Secondary | ICD-10-CM | POA: Diagnosis present

## 2014-07-18 LAB — CBC
HCT: 33.8 % — ABNORMAL LOW (ref 36.0–46.0)
Hemoglobin: 11.4 g/dL — ABNORMAL LOW (ref 12.0–15.0)
MCH: 32.9 pg (ref 26.0–34.0)
MCHC: 33.7 g/dL (ref 30.0–36.0)
MCV: 97.7 fL (ref 78.0–100.0)
PLATELETS: UNDETERMINED 10*3/uL (ref 150–400)
RBC: 3.46 MIL/uL — AB (ref 3.87–5.11)
RDW: 14.1 % (ref 11.5–15.5)
WBC: 6.5 10*3/uL (ref 4.0–10.5)

## 2014-07-18 LAB — COMPREHENSIVE METABOLIC PANEL
ALBUMIN: 2.8 g/dL — AB (ref 3.5–5.2)
ALK PHOS: 70 U/L (ref 39–117)
ALT: 18 U/L (ref 0–35)
AST: 26 U/L (ref 0–37)
Anion gap: 9 (ref 5–15)
BILIRUBIN TOTAL: 0.8 mg/dL (ref 0.3–1.2)
BUN: 5 mg/dL — ABNORMAL LOW (ref 6–23)
CHLORIDE: 105 meq/L (ref 96–112)
CO2: 23 mmol/L (ref 19–32)
Calcium: 8.3 mg/dL — ABNORMAL LOW (ref 8.4–10.5)
Creatinine, Ser: 0.82 mg/dL (ref 0.50–1.10)
GFR calc Af Amer: >90 mL/min (ref >90)
GFR calc non Af Amer: 88 mL/min — ABNORMAL LOW (ref >90)
Glucose, Bld: 75 mg/dL (ref 70–99)
POTASSIUM: 4.1 mmol/L (ref 3.5–5.1)
Sodium: 137 mmol/L (ref 135–145)
Total Protein: 6 g/dL (ref 6.0–8.3)

## 2014-07-18 LAB — GLUCOSE, CAPILLARY
Glucose-Capillary: 84 mg/dL (ref 70–99)
Glucose-Capillary: 98 mg/dL (ref 70–99)

## 2014-07-18 LAB — LIPASE, BLOOD: Lipase: 36 U/L (ref 11–59)

## 2014-07-18 MED ORDER — HYDROMORPHONE HCL 1 MG/ML IJ SOLN
1.0000 mg | INTRAMUSCULAR | Status: DC | PRN
Start: 2014-07-18 — End: 2014-07-18
  Administered 2014-07-18 (×2): 1 mg via INTRAVENOUS
  Filled 2014-07-18 (×2): qty 1

## 2014-07-18 MED ORDER — OXYCODONE-ACETAMINOPHEN 5-325 MG PO TABS: 2.0000 | ORAL_TABLET | Freq: Four times a day (QID) | ORAL | Status: DC | PRN

## 2014-07-18 MED ORDER — HYDROMORPHONE HCL 2 MG PO TABS: 2.0000 mg | ORAL_TABLET | Freq: Two times a day (BID) | ORAL | Status: DC | PRN

## 2014-07-18 NOTE — Discharge Instructions (Signed)

## 2014-07-18 NOTE — Care Management Note (Signed)
    Page 1 of 1   07/18/2014     11:55:58 AM CARE MANAGEMENT NOTE 07/18/2014  Patient:  [facility]   Account Number:  [id number]  Date Initiated:  07/18/2014  Documentation initiated by:  Tomi Bamberger  Subjective/Objective Assessment:   dx acute pancreatitis  admit-lives with daughter.     Action/Plan:   Anticipated DC Date:  07/18/2014   Anticipated DC Plan:  [place]  CM consult      Choice offered to / List presented to:             Status of service:  Completed, signed off Medicare Important Message given?  NA - LOS <3 / Initial given by admissions (If response is "NO", the following Medicare IM given date fields will be blank) Date Medicare IM given:   Medicare IM given by:   Date Additional Medicare IM given:   Additional Medicare IM given by:    Discharge Disposition:  HOME/SELF CARE  Per UR Regulation:  Reviewed for med. necessity/level of care/duration of stay  If discussed at [place] of Stay Meetings, dates discussed:    Comments:  07/18/14 [place], BSN [phone] patient for dc today.  No needs anticipated.

## 2014-07-18 NOTE — Progress Notes (Signed)
On call NP Baltazar Najjar paged per pt request. Charge nurse previously speak to pt about more pain medicine. Nurse and charge nurse explained to pt that we will give pain medicine when available. Pt state pain is progressively getting worse. Request to speak to on call NP. Baltazar Najjar call charge nurse and state no more pain medicine will be ordered for pt tonight. Charge nurse explain to pt what NP has stated.

## 2014-07-18 NOTE — Progress Notes (Signed)
[name] Najjar, NP called nurse and stated that no more pain medication will be ordered for pt tonight. Went into room to talk with pt, pt very tearful and states her pain is 5 times worse and her condition has changed. Asked pt what she meant that her condition has changed. Pt states that her pain is much worse now and that is the only thing that has changed with her. Gave pt 2 heating packs and emotional support. Pt is still very tearful. Told pt that nurse will be in to give dilaudid when it is due. Will continue to monitor pt. [name] Oyster, RN (charge nurse)

## 2014-07-18 NOTE — Discharge Summary (Addendum)
Physician Discharge Summary  [name] DZH:[id number] DOB: 08-17-1975 DOA: 07/16/2014  PCP: Leandrew Koyanagi, MD  Admit date: 07/16/2014 Discharge date: 07/18/2014  Time spent: 35 minutes  Recommendations for Outpatient Follow-up:  D/c home with outpt PCP follow up PCP in 2 weeks  Discharge Diagnoses:  Principle problem: Acute etoh pancreatitis  Active Problems:   Obesity, Class III, BMI 40-49.9 (morbid obesity)   Hypothyroidism   Substance induced mood disorder   ETOH abuse   Drug-seeking behavior   Discharge Condition: fair  Diet recommendation: full liquids, advance to regular as tolerated  in 1-2 days  [facility] Weights   07/17/14 0051  Weight: 148.916 kg (328 lb 4.8 oz)    History of present illness:  38 y.o. female with hypothyroidism, etoh abuse , tobacco abuse with substance induced mood disorder, presented with abdominal pain for 3 days worsened for past 24 hrs prior to admission. Pain was radiating to her back as well associated with nausea and vomiting. She denied any chest pain, palpitations or shortness of breath. Also was coughing up yellow green sputum. Patient has history of alcohol abuse which she reports to be in remission but has had hospitalization to behavioral health for alcohol-induced mood disorder. She reports quitting for quite some time but went into binge drinking on her birthday 4 days prior to hospitalization. Patient's lipase was found to be elevated to 119 admission with normal LFTs. CT scan of the abdomen and pelvis showed pancreatic enlargement with internal fat stranding and peripancreatic fluid suggestive of acute pancreatitis. No pseudocyst or ductal dilatation was noted. Patient admitted to medical floor.  Hospital Course:  Acute alcoholic pancreatitis Supported by clinical findings, elevated lipase and CT findings with  recent binge drinking. Ultrasound abdomen negative for gallstone or CBD obstruction. Since admission patient  complaining of ongoing abdominal pain requiring IV Dilaudid every hour. Patient was kept nothing by mouth and given IV hydration with normal saline. Overnight patient careful complaining of pain and asking for IV Dilaudid and Ativan frequently. On evaluation this morning patient does not appear to be any distress and when previous physician or nurse entered the room patient was found to be snoring loudly but complained of severe pain on waking her up. -On physical exam patient minimal epigastric tenderness when distracted. She does not appear to be any distress. When informed to her that I will not give her any further IV pain medications and start her on diet with oral pain medications she reports that she feels better wanting to go home. -I have started her on full liquid diet and has tolerated well. I do not think she needs further hospitalization and her abdominal pain appeared to be out of proportion to her symptoms. Patient's vitals are stable and lipase has normalized. -Patient counseled strongly in quitting alcohol.  -And outpatient follow-up has been arranged with her PCP for next week. Patient will be discharged on a short course of Percocet (q6hrs prn for pain, 30 tablets) and total 6 tablets of oral Dilaudid for severe pain. Patient instructed to return to ED if she has worsening pain, nausea or vomiting.  hypothyroidism Continue Synthroid. TSH of 7. Repeat TSH in 3-4 weeks.  ? Acute bronchitis Was started on oral azithromycin. No further symptoms. Will discontinue antibiotics  Anemia Follow-up as outpatient  Morbid obesity Counseled on weight loss and exercise  Tobacco abuse Counseled on cessation. Does not appear interested to quit at this time.    Procedures:  CT abdomen and pelvis  Consultations:  None  Discharge Exam: Filed Vitals:   07/18/14 1332  BP:   Pulse: 84  Temp: 98.6 F (37 C)  Resp: 18    General: He did aged obese female in no acute  distress HEENT: No pallor, moist oral mucosa Chest: Clear to auscultation bilaterally CVS: Normal S1 and S2, no murmurs Abdomen: Soft, nondistended, minimal epigastric tenderness, bowel sounds present Extremities: Warm, no edema    Discharge Instructions    Current Discharge Medication List    START taking these medications   Details  HYDROmorphone (DILAUDID) 2 MG tablet Take 1 tablet (2 mg total) by mouth every 12 (twelve) hours as needed for severe pain. Qty: 6 tablet, Refills: 0    oxyCODONE-acetaminophen (ROXICET) 5-325 MG per tablet Take 2 tablets by mouth every 6 (six) hours as needed for severe pain. Qty: 30 tablet, Refills: 0      CONTINUE these medications which have NOT CHANGED   Details  furosemide (LASIX) 40 MG tablet Take 40 mg by mouth 2 (two) times daily as needed for fluid.    gabapentin (NEURONTIN) 400 MG capsule Take 800 mg by mouth 4 (four) times daily.         levothyroxine (SYNTHROID, LEVOTHROID) 50 MCG tablet TAKE ONE TABLET BY MOUTH ONCE DAILY BEFORE  BREAKFAST Qty: 30 tablet, Refills: 2    methocarbamol (ROBAXIN) 500 MG tablet Take 1 tablet (500 mg total) by mouth every 8 (eight) hours as needed for muscle spasms. Qty: 28 tablet, Refills: 0    mirtazapine (REMERON) 30 MG tablet Take 1 tablet (30 mg total) by mouth at bedtime. Qty: 14 tablet, Refills: 0    PARoxetine (PAXIL) 40 MG tablet Take 40 mg by mouth every morning.    zolpidem (AMBIEN) 10 MG tablet Take 10 mg by mouth at bedtime as needed for sleep.    hydrOXYzine (ATARAX/VISTARIL) 25 MG tablet Take 1 tablet (25 mg total) by mouth every 6 (six) hours as needed for anxiety. Qty: 21 tablet, Refills: 0      STOP taking these medications     ranitidine (ZANTAC) 300 MG tablet   ibuprofen (ADVIL,MOTRIN) 800 MG tablet     Allergies  Allergen Reactions  . Penicillins Hives  . Quinolones Hives    Hives with moxifloxacin and levofloxacin   Follow-up Information    Follow up with PCP on  07/25/2014 at 2:30 pm             The results of significant diagnostics from this hospitalization (including imaging, microbiology, ancillary and laboratory) are listed below for reference.    Significant Diagnostic Studies: Ct Abdomen Pelvis W Contrast  07/16/2014   CLINICAL DATA:  Right-sided abdominal pain, fever, abdominal bloating. History of vaginal cancer.  EXAM: CT ABDOMEN AND PELVIS WITH CONTRAST  TECHNIQUE: Multidetector CT imaging of the abdomen and pelvis was performed using the standard protocol following bolus administration of intravenous contrast.  CONTRAST:  176mL OMNIPAQUE IOHEXOL 300 MG/ML  SOLN  COMPARISON:  04/10/2013  FINDINGS: Trace pleural fluid is noted with adjacent dependent presumed atelectasis. Stable nodularity abutting the left hemidiaphragm, image 11.  Geographic probable focal fat noted within the medial segment left hepatic lobe measuring 1.4 cm image 24. No intra or extrahepatic ductal dilatation. Gallbladder is unremarkable. Adrenal glands and kidneys are unremarkable. 0.9 cm hypodense splenic lesion is reidentified, not further characterized but statistically most likely hemangioma or lymphangioma. Pancreas is diffusely fatty infiltrated, with internal fat stranding, global enlargement, and a small amount  of peripancreatic fluid. Probable splenule reidentified image 31.  No free air. No lymphadenopathy. Small retroperitoneal nodes measure 5 mm and smaller. Normal appendix. No bowel wall thickening or focal segmental dilatation is identified.  Uterus and ovaries are normal.  The bladder is normal.  No acute osseous abnormality. L5-S1 fusion hardware reidentified. No evidence for hardware failure.  IMPRESSION: Pancreatic enlargement, internal fat stranding, and peripancreatic fluid suggesting pancreatitis. No complicating features such as pseudocyst formation or ductal dilatation.   Electronically Signed   By: Conchita Paris M.D.   On: 07/16/2014 21:50   US Abdomen  Limited Ruq  07/16/2014   CLINICAL DATA:  Right upper quadrant abdominal pain.  EXAM: US ABDOMEN LIMITED - RIGHT UPPER QUADRANT  COMPARISON:  None.  FINDINGS: Gallbladder:  No gallstones or wall thickening visualized. No sonographic Murphy sign noted.  Common bile duct:  Diameter: 4 mm  Liver:  Hepatic increased echogenicity likely indicates underlying steatosis without focal abnormality or intrahepatic ductal dilatation.  IMPRESSION: Hepatic increased echogenicity likely indicates underlying steatosis without focal abnormality or intrahepatic ductal dilatation. No acute abnormality.   Electronically Signed   By: Conchita Paris M.D.   On: 07/16/2014 18:04    Microbiology: No results found for this or any previous visit (from the past 240 hour(s)).   Labs: Basic Metabolic Panel:  Recent Labs Lab 07/16/14 1330 07/17/14 0605 07/18/14 0705  NA 139 138 137  K 3.9 3.8 4.1  CL 101 106 105  CO2 25 22 23   GLUCOSE 90 88 75  BUN 6 <5* 5*  CREATININE 0.85 0.84 0.82  CALCIUM 8.9 8.3* 8.3*   Liver Function Tests:  Recent Labs Lab 07/16/14 1330 07/17/14 0605 07/18/14 0705  AST 22 22 26   ALT 25 21 18   ALKPHOS 90 73 70  BILITOT 0.2* 0.3 0.8  PROT 6.9 6.0 6.0  ALBUMIN 3.2* 3.0* 2.8*    Recent Labs Lab 07/16/14 1330 07/18/14 0705  LIPASE 119* 36   No results for input(s): AMMONIA in the last 168 hours. CBC:  Recent Labs Lab 07/16/14 1330 07/17/14 0605 07/18/14 0705  WBC 7.7 7.0 6.5  NEUTROABS 5.9  --   --   HGB 10.8* 11.1* 11.4*  HCT 32.6* 33.3* 33.8*  MCV 95.3 99.4 97.7  PLT 121* 126* PLATELET CLUMPS NOTED ON SMEAR, UNABLE TO ESTIMATE   Cardiac Enzymes: No results for input(s): CKTOTAL, CKMB, CKMBINDEX, TROPONINI in the last 168 hours. BNP: BNP (last 3 results)  Recent Labs  03/26/14 1954  PROBNP 15.7   CBG:  Recent Labs Lab 07/17/14 0806 07/18/14 0754 07/18/14 1312  GLUCAP 86 84 98       Signed:  Gerrad Welker  [facility] 07/18/2014,  1:56 PM

## 2014-07-25 ENCOUNTER — Ambulatory Visit: Payer: Self-pay | Admitting: Internal Medicine

## 2014-07-30 ENCOUNTER — Emergency Department (HOSPITAL_COMMUNITY)
Admission: EM | Admit: 2014-07-30 | Discharge: 2014-07-31 | Disposition: A | Discharge status: Home / Self Care | Payer: Medicare Other | Attending: Emergency Medicine | Admitting: Emergency Medicine

## 2014-07-30 ENCOUNTER — Encounter (HOSPITAL_COMMUNITY): Payer: Self-pay | Admitting: Emergency Medicine

## 2014-07-30 DIAGNOSIS — Z8719 Personal history of other diseases of the digestive system: Secondary | ICD-10-CM | POA: Diagnosis not present

## 2014-07-30 DIAGNOSIS — R110 Nausea: Secondary | ICD-10-CM | POA: Diagnosis not present

## 2014-07-30 DIAGNOSIS — F419 Anxiety disorder, unspecified: Secondary | ICD-10-CM | POA: Insufficient documentation

## 2014-07-30 DIAGNOSIS — G629 Polyneuropathy, unspecified: Secondary | ICD-10-CM | POA: Diagnosis not present

## 2014-07-30 DIAGNOSIS — R1011 Right upper quadrant pain: Secondary | ICD-10-CM | POA: Insufficient documentation

## 2014-07-30 DIAGNOSIS — Z8544 Personal history of malignant neoplasm of other female genital organs: Secondary | ICD-10-CM | POA: Diagnosis not present

## 2014-07-30 DIAGNOSIS — Z79899 Other long term (current) drug therapy: Secondary | ICD-10-CM | POA: Insufficient documentation

## 2014-07-30 DIAGNOSIS — M13861 Other specified arthritis, right knee: Secondary | ICD-10-CM | POA: Insufficient documentation

## 2014-07-30 DIAGNOSIS — Z880 Allergy status to penicillin: Secondary | ICD-10-CM | POA: Insufficient documentation

## 2014-07-30 DIAGNOSIS — Z9851 Tubal ligation status: Secondary | ICD-10-CM | POA: Insufficient documentation

## 2014-07-30 DIAGNOSIS — G4730 Sleep apnea, unspecified: Secondary | ICD-10-CM | POA: Insufficient documentation

## 2014-07-30 DIAGNOSIS — Z720 Tobacco use: Secondary | ICD-10-CM | POA: Insufficient documentation

## 2014-07-30 DIAGNOSIS — Z9981 Dependence on supplemental oxygen: Secondary | ICD-10-CM | POA: Insufficient documentation

## 2014-07-30 DIAGNOSIS — G8929 Other chronic pain: Secondary | ICD-10-CM | POA: Diagnosis not present

## 2014-07-30 DIAGNOSIS — E039 Hypothyroidism, unspecified: Secondary | ICD-10-CM | POA: Diagnosis not present

## 2014-07-30 DIAGNOSIS — M13862 Other specified arthritis, left knee: Secondary | ICD-10-CM | POA: Diagnosis not present

## 2014-07-30 DIAGNOSIS — R197 Diarrhea, unspecified: Secondary | ICD-10-CM | POA: Insufficient documentation

## 2014-07-30 DIAGNOSIS — R1012 Left upper quadrant pain: Secondary | ICD-10-CM

## 2014-07-30 DIAGNOSIS — Z9889 Other specified postprocedural states: Secondary | ICD-10-CM | POA: Insufficient documentation

## 2014-07-30 LAB — CBC WITH DIFFERENTIAL/PLATELET
BASOS PCT: 1 % (ref 0–1)
Basophils Absolute: 0.1 10*3/uL (ref 0.0–0.1)
Eosinophils Absolute: 0.2 10*3/uL (ref 0.0–0.7)
Eosinophils Relative: 2 % (ref 0–5)
HCT: 39.2 % (ref 36.0–46.0)
HEMOGLOBIN: 12.9 g/dL (ref 12.0–15.0)
LYMPHS ABS: 2.1 10*3/uL (ref 0.7–4.0)
Lymphocytes Relative: 31 % (ref 12–46)
MCH: 31.9 pg (ref 26.0–34.0)
MCHC: 32.9 g/dL (ref 30.0–36.0)
MCV: 96.8 fL (ref 78.0–100.0)
MONOS PCT: 10 % (ref 3–12)
Monocytes Absolute: 0.7 10*3/uL (ref 0.1–1.0)
Neutro Abs: 3.8 10*3/uL (ref 1.7–7.7)
Neutrophils Relative %: 56 % (ref 43–77)
Platelets: 260 10*3/uL (ref 150–400)
RBC: 4.05 MIL/uL (ref 3.87–5.11)
RDW: 13.6 % (ref 11.5–15.5)
WBC: 6.8 10*3/uL (ref 4.0–10.5)

## 2014-07-30 MED ORDER — HYDROMORPHONE HCL 1 MG/ML IJ SOLN
1.0000 mg | Freq: Once | INTRAMUSCULAR | Status: AC
  Administered 2014-07-30: 1 mg via INTRAVENOUS
  Filled 2014-07-30: qty 1

## 2014-07-30 MED ORDER — ONDANSETRON HCL 4 MG/2ML IJ SOLN
4.0000 mg | Freq: Once | INTRAMUSCULAR | Status: AC
  Administered 2014-07-30: 4 mg via INTRAVENOUS
  Filled 2014-07-30: qty 2

## 2014-07-30 MED ORDER — SODIUM CHLORIDE 0.9 % IV BOLUS (SEPSIS)
1000.0000 mL | Freq: Once | INTRAVENOUS | Status: AC
  Administered 2014-07-30: 1000 mL via INTRAVENOUS

## 2014-07-30 NOTE — ED Notes (Addendum)
Pt arrived via EMS on stretcher with c/o LUQ pain x2 days with nausea but denies vomiting. Pt reported taking Motrin without relief. Pt reported taking Vicodin x8 in 48 hrs for root canal that was done 3 days ago.

## 2014-07-30 NOTE — ED Notes (Signed)
Awake. Verbally responsive. A/O x4. Resp even and unlabored. No audible adventitious breath sounds noted. ABC's intact. Pt restless and constantly moving around in room and on bed.

## 2014-07-30 NOTE — ED Notes (Signed)
Bed: WA04 Expected date:  Expected time:  Means of arrival:  Comments: EMS abd pain - hx of pancreatitis

## 2014-07-31 LAB — URINALYSIS, ROUTINE W REFLEX MICROSCOPIC
BILIRUBIN URINE: NEGATIVE
Glucose, UA: NEGATIVE mg/dL
Hgb urine dipstick: NEGATIVE
Ketones, ur: NEGATIVE mg/dL
Leukocytes, UA: NEGATIVE
Nitrite: NEGATIVE
Protein, ur: NEGATIVE mg/dL
SPECIFIC GRAVITY, URINE: 1.029 (ref 1.005–1.030)
UROBILINOGEN UA: 0.2 mg/dL (ref 0.0–1.0)
pH: 6 (ref 5.0–8.0)

## 2014-07-31 LAB — COMPREHENSIVE METABOLIC PANEL
ALBUMIN: 3.7 g/dL (ref 3.5–5.2)
ALT: 26 U/L (ref 0–35)
ANION GAP: 2 — AB (ref 5–15)
AST: 26 U/L (ref 0–37)
Alkaline Phosphatase: 52 U/L (ref 39–117)
BUN: 17 mg/dL (ref 6–23)
CO2: 28 mmol/L (ref 19–32)
CREATININE: 0.83 mg/dL (ref 0.50–1.10)
Calcium: 9.3 mg/dL (ref 8.4–10.5)
Chloride: 109 mEq/L (ref 96–112)
GFR calc Af Amer: >90 mL/min (ref >90)
GFR, EST NON AFRICAN AMERICAN: 87 mL/min — AB (ref >90)
GLUCOSE: 97 mg/dL (ref 70–99)
POTASSIUM: 3.8 mmol/L (ref 3.5–5.1)
SODIUM: 139 mmol/L (ref 135–145)
Total Bilirubin: 0.4 mg/dL (ref 0.3–1.2)
Total Protein: 6.9 g/dL (ref 6.0–8.3)

## 2014-07-31 LAB — LIPASE, BLOOD: Lipase: 26 U/L (ref 11–59)

## 2014-07-31 LAB — ETHANOL

## 2014-07-31 MED ORDER — HYDROCODONE-ACETAMINOPHEN 5-325 MG PO TABS: 1.0000 | ORAL_TABLET | Freq: Four times a day (QID) | ORAL | Status: DC | PRN

## 2014-07-31 MED ORDER — PROMETHAZINE HCL 25 MG PO TABS: 25.0000 mg | ORAL_TABLET | Freq: Four times a day (QID) | ORAL | Status: DC | PRN

## 2014-07-31 NOTE — ED Notes (Signed)
Awake. Verbally responsive. A/O x4. Resp even and unlabored. No audible adventitious breath sounds noted. ABC's intact.  

## 2014-07-31 NOTE — ED Provider Notes (Signed)
CSN: [id number]     Arrival date & time 07/30/14  2304 History   First MD Initiated Contact with Patient 07/30/14 2311     Chief Complaint  Patient presents with  . Abdominal Pain     (Consider location/radiation/quality/duration/timing/severity/associated sxs/prior Treatment) HPI [name] is a 39 y.o. female  With history of chronic back pain, alcohol abuse, and polysubstance abuse, thyroid disease, pancreatitis,presents to emergency department complaining of abdominal pain. Patient was hospitalized 2 weeks ago for the same, was diagnosed with pancreatitis. States went home, states that she has been sober since her hospitalization, has been eating well, and felt slightly better. About 3 days ago pain returned. States pain is worse than before. Pain is sharp, in the left upper quadrant, radiates to the right upper quadrant. She admits to nausea, no vomiting. Admits to loose stools. States she has been taking hydrocodone with no relief of her symptoms.states nothing is making her symptoms better or worse. Denies any fever or chills. No other symptoms.   Past Medical History  Diagnosis Date  . Thyroid disease   . Chronic back pain   . Hypothyroidism   . Anxiety   . GERD (gastroesophageal reflux disease)   . Sleep apnea     cpap  . Headache(784.0)   . Neuromuscular disorder     neuropathy  . Arthritis     knees bilateral   DJD  stenosis   . Blood dyscrasia     HSV  . PCOS (polycystic ovarian syndrome)   . Cancer     vaginal  malignant carcinoma  . Alcohol abuse   . Drug-seeking behavior   . Polysubstance abuse    Past Surgical History  Procedure Laterality Date  . Back surgery    . Cesarean section    . Tonsillectomy    . Tubal ligation    . Fracture surgery     Family History  Problem Relation Age of Onset  . Arthritis Mother   . Clotting disorder Mother   . Hyperlipidemia Father   . Leukemia Maternal Grandmother   . Diabetes Maternal Grandfather   . Stroke  Paternal Grandmother   . Heart disease Father   . Diabetes Mother   . Hypertension Mother   . Prostate cancer Father   . Heart disease Paternal Grandmother    History  Substance Use Topics  . Smoking status: Current Every [place] Smoker -- 1.00 packs/[place] for 20 years    Types: Cigarettes  . Smokeless tobacco: Never Used  . Alcohol Use: No     Comment: denies alcohol since 12/16   OB History    Gravida Para Term Preterm AB TAB SAB Ectopic Multiple Living   1 1 1             Review of Systems  Constitutional: Negative for fever and chills.  Respiratory: Negative for cough, chest tightness and shortness of breath.   Cardiovascular: Negative for chest pain, palpitations and leg swelling.  Gastrointestinal: Positive for nausea, abdominal pain and diarrhea. Negative for vomiting.  Genitourinary: Negative for dysuria, flank pain and pelvic pain.  Musculoskeletal: Negative for myalgias, arthralgias, neck pain and neck stiffness.  Skin: Negative for rash.  Neurological: Negative for dizziness, weakness and headaches.  All other systems reviewed and are negative.     Allergies  Penicillins and Quinolones  Home Medications   Prior to Admission medications   Medication Sig Start Date End Date Taking? Authorizing Provider  furosemide (LASIX) 40 MG tablet Take 40  mg by mouth 2 (two) times daily as needed for fluid.    Historical Provider, MD  gabapentin (NEURONTIN) 400 MG capsule Take 800 mg by mouth 4 (four) times daily.    Historical Provider, MD  HYDROmorphone (DILAUDID) 2 MG tablet Take 1 tablet (2 mg total) by mouth every 12 (twelve) hours as needed for severe pain. 07/18/14   Nishant Dhungel, MD  hydrOXYzine (ATARAX/VISTARIL) 25 MG tablet Take 1 tablet (25 mg total) by mouth every 6 (six) hours as needed for anxiety. 06/27/14   Benjamine Mola, FNP  levothyroxine (SYNTHROID, LEVOTHROID) 50 MCG tablet TAKE ONE TABLET BY MOUTH ONCE DAILY BEFORE  BREAKFAST Patient taking differently: Take  50 mcg by mouth daily before breakfast. TAKE ONE TABLET BY MOUTH ONCE DAILY BEFORE  BREAKFAST 06/27/14   Benjamine Mola, FNP  methocarbamol (ROBAXIN) 500 MG tablet Take 1 tablet (500 mg total) by mouth every 8 (eight) hours as needed for muscle spasms. 06/27/14   Benjamine Mola, FNP  mirtazapine (REMERON) 30 MG tablet Take 1 tablet (30 mg total) by mouth at bedtime. 06/27/14   Benjamine Mola, FNP  oxyCODONE-acetaminophen (ROXICET) 5-325 MG per tablet Take 2 tablets by mouth every 6 (six) hours as needed for severe pain. 07/18/14   Nishant Dhungel, MD  PARoxetine (PAXIL) 40 MG tablet Take 40 mg by mouth every morning.    Historical Provider, MD  zolpidem (AMBIEN) 10 MG tablet Take 10 mg by mouth at bedtime as needed for sleep.    Historical Provider, MD   BP 152/64 mmHg  Pulse 113  Temp(Src) 98.8 F (37.1 C) (Oral)  Resp 16  SpO2 98%  LMP 07/16/2014 Physical Exam  Constitutional: She appears well-developed and well-nourished. No distress.  HENT:  Head: Normocephalic.  Eyes: Conjunctivae are normal.  Neck: Neck supple.  Cardiovascular: Normal rate, regular rhythm and normal heart sounds.   Pulmonary/Chest: Effort normal and breath sounds normal. No respiratory distress. She has no wheezes. She has no rales.  Abdominal: Soft. Bowel sounds are normal. She exhibits no distension. There is tenderness. There is no rebound.  LUQ tenderness  Musculoskeletal: She exhibits no edema.  Neurological: She is alert.  Skin: Skin is warm and dry.  Psychiatric: She has a normal mood and affect. Her behavior is normal.  Nursing note and vitals reviewed.   ED Course  Procedures (including critical care time) Labs Review Labs Reviewed  COMPREHENSIVE METABOLIC PANEL - Abnormal; Notable for the following:    GFR calc non Af Amer 87 (*)    Anion gap 2 (*)    All other components within normal limits  URINALYSIS, ROUTINE W REFLEX MICROSCOPIC - Abnormal; Notable for the following:    APPearance CLOUDY (*)     All other components within normal limits  CBC WITH DIFFERENTIAL  LIPASE, BLOOD  ETHANOL  POC URINE PREG, ED    Imaging Review No results found.   EKG Interpretation None      MDM   Final diagnoses:  Left upper quadrant pain    Patient with history of chronic pain, drug-seeking behavior in the past, presents to emergency partner complaining of abdominal pain. Patient was recently admitted for pancreatitis by CT scan. She is complaining of similar pain. Will get labs, urinalysis, fluids ordered. Patient is not vomiting. She is nontoxic appearing. She is requesting Dilaudid for pain, she states nothing else works. Will order 1 dose and wait on labs.   1:03 AM Patient's lab work is all  unremarkable including normal lipase. Normal white count. She appears to be comfortable after 1 mg of Dilaudid. She is tolerating by mouth's. She is in no distress. At this time I suspect most likely chronic pain versus mild pancreatitis. Will discharge home with oral liquids. Norco for pain. Follow-up with primary care doctor.  Filed Vitals:   07/30/14 2309 07/30/14 2312 07/31/14 0054  BP: 152/64  123/70  Pulse: 113  84  Temp: 98.8 F (37.1 C)    TempSrc: Oral    Resp: 16  18  SpO2: 96% 98% 96%       Florene Route Kelleigh Skerritt, PA-C 07/31/14 0104  Everlene Balls, MD 07/31/14 [phone]

## 2014-07-31 NOTE — Discharge Instructions (Signed)
Phenergan for nausea. Norco for pain. Clear liquid diet. Follow up with primary care doctor.   Acute Pancreatitis Acute pancreatitis is a disease in which the pancreas becomes suddenly inflamed. The pancreas is a large gland located behind your stomach. The pancreas produces enzymes that help digest food. The pancreas also releases the hormones glucagon and insulin that help regulate blood sugar. Damage to the pancreas occurs when the digestive enzymes from the pancreas are activated and begin attacking the pancreas before being released into the intestine. Most acute attacks last a couple of days and can cause serious complications. Some people become dehydrated and develop low blood pressure. In severe cases, bleeding into the pancreas can lead to shock and can be life-threatening. The lungs, heart, and kidneys may fail. CAUSES  Pancreatitis can happen to anyone. In some cases, the cause is unknown. Most cases are caused by:  Alcohol abuse.  Gallstones. Other less common causes are:  Certain medicines.  Exposure to certain chemicals.  Infection.  Damage caused by an accident (trauma).  Abdominal surgery. SYMPTOMS   Pain in the upper abdomen that may radiate to the back.  Tenderness and swelling of the abdomen.  Nausea and vomiting. DIAGNOSIS  Your caregiver will perform a physical exam. Blood and stool tests may be done to confirm the diagnosis. Imaging tests may also be done, such as X-rays, CT scans, or an ultrasound of the abdomen. TREATMENT  Treatment usually requires a stay in the hospital. Treatment may include:  Pain medicine.  Fluid replacement through an intravenous line (IV).  Placing a tube in the stomach to remove stomach contents and control vomiting.  Not eating for 3 or 4 days. This gives your pancreas a rest, because enzymes are not being produced that can cause further damage.  Antibiotic medicines if your condition is caused by an infection.  Surgery of  the pancreas or gallbladder. HOME CARE INSTRUCTIONS   Follow the diet advised by your caregiver. This may involve avoiding alcohol and decreasing the amount of fat in your diet.  Eat smaller, more frequent meals. This reduces the amount of digestive juices the pancreas produces.  Drink enough fluids to keep your urine clear or pale yellow.  Only take over-the-counter or prescription medicines as directed by your caregiver.  Avoid drinking alcohol if it caused your condition.  Do not smoke.  Get plenty of rest.  Check your blood sugar at home as directed by your caregiver.  Keep all follow-up appointments as directed by your caregiver. SEEK MEDICAL CARE IF:   You do not recover as quickly as expected.  You develop new or worsening symptoms.  You have persistent pain, weakness, or nausea.  You recover and then have another episode of pain. SEEK IMMEDIATE MEDICAL CARE IF:   You are unable to eat or keep fluids down.  Your pain becomes severe.  You have a fever or persistent symptoms for more than 2 to 3 days.  You have a fever and your symptoms suddenly get worse.  Your skin or the white part of your eyes turn yellow (jaundice).  You develop vomiting.  You feel dizzy, or you faint.  Your blood sugar is high (over 300 mg/dL). MAKE SURE YOU:   Understand these instructions.  Will watch your condition.  Will get help right away if you are not doing well or get worse. Document Released: 07/13/2005 Document Revised: 01/12/2012 Document Reviewed: 10/22/2011 [facility] Patient Information 2015 [place], [place]. This information is not intended to replace  advice given to you by your health care provider. Make sure you discuss any questions you have with your health care provider.

## 2014-08-09 ENCOUNTER — Other Ambulatory Visit: Payer: Self-pay | Admitting: Internal Medicine

## 2014-08-16 ENCOUNTER — Emergency Department (HOSPITAL_COMMUNITY)
Admission: EM | Admit: 2014-08-16 | Discharge: 2014-08-16 | Discharge status: Against Medical Advice | Payer: Medicare Other | Attending: Emergency Medicine | Admitting: Emergency Medicine

## 2014-08-16 ENCOUNTER — Encounter (HOSPITAL_COMMUNITY): Payer: Self-pay | Admitting: *Deleted

## 2014-08-16 DIAGNOSIS — Z8719 Personal history of other diseases of the digestive system: Secondary | ICD-10-CM | POA: Diagnosis not present

## 2014-08-16 DIAGNOSIS — F419 Anxiety disorder, unspecified: Secondary | ICD-10-CM | POA: Insufficient documentation

## 2014-08-16 DIAGNOSIS — G709 Myoneural disorder, unspecified: Secondary | ICD-10-CM | POA: Diagnosis not present

## 2014-08-16 DIAGNOSIS — Z8619 Personal history of other infectious and parasitic diseases: Secondary | ICD-10-CM | POA: Insufficient documentation

## 2014-08-16 DIAGNOSIS — Z880 Allergy status to penicillin: Secondary | ICD-10-CM | POA: Diagnosis not present

## 2014-08-16 DIAGNOSIS — G4730 Sleep apnea, unspecified: Secondary | ICD-10-CM | POA: Diagnosis not present

## 2014-08-16 DIAGNOSIS — R109 Unspecified abdominal pain: Secondary | ICD-10-CM

## 2014-08-16 DIAGNOSIS — Z862 Personal history of diseases of the blood and blood-forming organs and certain disorders involving the immune mechanism: Secondary | ICD-10-CM | POA: Diagnosis not present

## 2014-08-16 DIAGNOSIS — R1084 Generalized abdominal pain: Secondary | ICD-10-CM | POA: Diagnosis not present

## 2014-08-16 DIAGNOSIS — Z9981 Dependence on supplemental oxygen: Secondary | ICD-10-CM | POA: Diagnosis not present

## 2014-08-16 DIAGNOSIS — Z8542 Personal history of malignant neoplasm of other parts of uterus: Secondary | ICD-10-CM | POA: Insufficient documentation

## 2014-08-16 DIAGNOSIS — E079 Disorder of thyroid, unspecified: Secondary | ICD-10-CM | POA: Diagnosis not present

## 2014-08-16 DIAGNOSIS — Z9851 Tubal ligation status: Secondary | ICD-10-CM | POA: Insufficient documentation

## 2014-08-16 DIAGNOSIS — M545 Low back pain: Secondary | ICD-10-CM | POA: Insufficient documentation

## 2014-08-16 DIAGNOSIS — Z8742 Personal history of other diseases of the female genital tract: Secondary | ICD-10-CM | POA: Diagnosis not present

## 2014-08-16 DIAGNOSIS — E039 Hypothyroidism, unspecified: Secondary | ICD-10-CM | POA: Diagnosis not present

## 2014-08-16 DIAGNOSIS — Z9889 Other specified postprocedural states: Secondary | ICD-10-CM | POA: Diagnosis not present

## 2014-08-16 DIAGNOSIS — M1990 Unspecified osteoarthritis, unspecified site: Secondary | ICD-10-CM | POA: Diagnosis not present

## 2014-08-16 DIAGNOSIS — R1010 Upper abdominal pain, unspecified: Secondary | ICD-10-CM | POA: Diagnosis present

## 2014-08-16 DIAGNOSIS — Z720 Tobacco use: Secondary | ICD-10-CM | POA: Diagnosis not present

## 2014-08-16 DIAGNOSIS — G64 Other disorders of peripheral nervous system: Secondary | ICD-10-CM | POA: Insufficient documentation

## 2014-08-16 DIAGNOSIS — G8929 Other chronic pain: Secondary | ICD-10-CM | POA: Diagnosis not present

## 2014-08-16 DIAGNOSIS — R112 Nausea with vomiting, unspecified: Secondary | ICD-10-CM | POA: Diagnosis not present

## 2014-08-16 LAB — CBC WITH DIFFERENTIAL/PLATELET
BASOS PCT: 1 % (ref 0–1)
Basophils Absolute: 0 10*3/uL (ref 0.0–0.1)
EOS ABS: 0.3 10*3/uL (ref 0.0–0.7)
EOS PCT: 4 % (ref 0–5)
HCT: 40.5 % (ref 36.0–46.0)
Hemoglobin: 13.8 g/dL (ref 12.0–15.0)
Lymphocytes Relative: 27 % (ref 12–46)
Lymphs Abs: 2 10*3/uL (ref 0.7–4.0)
MCH: 31.4 pg (ref 26.0–34.0)
MCHC: 34.1 g/dL (ref 30.0–36.0)
MCV: 92.3 fL (ref 78.0–100.0)
MONOS PCT: 7 % (ref 3–12)
Monocytes Absolute: 0.5 10*3/uL (ref 0.1–1.0)
Neutro Abs: 4.5 10*3/uL (ref 1.7–7.7)
Neutrophils Relative %: 61 % (ref 43–77)
Platelets: 190 10*3/uL (ref 150–400)
RBC: 4.39 MIL/uL (ref 3.87–5.11)
RDW: 14.2 % (ref 11.5–15.5)
WBC: 7.3 10*3/uL (ref 4.0–10.5)

## 2014-08-16 LAB — COMPREHENSIVE METABOLIC PANEL
ALK PHOS: 65 U/L (ref 39–117)
ALT: 65 U/L — ABNORMAL HIGH (ref 0–35)
AST: 36 U/L (ref 0–37)
Albumin: 3.9 g/dL (ref 3.5–5.2)
Anion gap: 10 (ref 5–15)
BUN: 6 mg/dL (ref 6–23)
CO2: 22 mmol/L (ref 19–32)
Calcium: 9.6 mg/dL (ref 8.4–10.5)
Chloride: 108 mEq/L (ref 96–112)
Creatinine, Ser: 1 mg/dL (ref 0.50–1.10)
GFR calc Af Amer: 81 mL/min — ABNORMAL LOW (ref >90)
GFR calc non Af Amer: 69 mL/min — ABNORMAL LOW (ref >90)
Glucose, Bld: 111 mg/dL — ABNORMAL HIGH (ref 70–99)
POTASSIUM: 4.4 mmol/L (ref 3.5–5.1)
SODIUM: 140 mmol/L (ref 135–145)
Total Bilirubin: 0.3 mg/dL (ref 0.3–1.2)
Total Protein: 7.1 g/dL (ref 6.0–8.3)

## 2014-08-16 LAB — LIPASE, BLOOD: Lipase: 29 U/L (ref 11–59)

## 2014-08-16 LAB — I-STAT CG4 LACTIC ACID, ED: LACTIC ACID, VENOUS: 1.98 mmol/L (ref 0.5–2.0)

## 2014-08-16 MED ORDER — SODIUM CHLORIDE 0.9 % IV BOLUS (SEPSIS): 500.0000 mL | Freq: Once | INTRAVENOUS | Status: DC

## 2014-08-16 MED ORDER — METOCLOPRAMIDE HCL 5 MG/ML IJ SOLN
10.0000 mg | Freq: Once | INTRAMUSCULAR | Status: DC
  Filled 2014-08-16: qty 2

## 2014-08-16 MED ORDER — PROMETHAZINE HCL 25 MG/ML IJ SOLN: 25.0000 mg | Freq: Once | INTRAMUSCULAR | Status: DC

## 2014-08-16 NOTE — ED Provider Notes (Signed)
CSN: [id number]     Arrival date & time 08/16/14  1201 History   First MD Initiated Contact with Patient 08/16/14 1256     Chief Complaint  Patient presents with  . Abdominal Pain     (Consider location/radiation/quality/duration/timing/severity/associated sxs/prior Treatment) HPI   39 y.o female complaining of upper abdominal pain with history of pancreatitis.  States pancreatitis December and hospitalized.  States pain returns with any po intake now.  Pain worse since last night.  STates took percocet x 2 without relief.  Pain epigastric radiates to back.  States in lower back and low right pelvic pain.  Vomited x 2 today.  Loose stools x 7 today.  No blood in stool.  Patient denies etoh intakd for 4 years.  PMD Dr. Laney Pastor urgent care.  Past Medical History  Diagnosis Date  . Thyroid disease   . Chronic back pain   . Hypothyroidism   . Anxiety   . GERD (gastroesophageal reflux disease)   . Sleep apnea     cpap  . Headache(784.0)   . Neuromuscular disorder     neuropathy  . Arthritis     knees bilateral   DJD  stenosis   . Blood dyscrasia     HSV  . PCOS (polycystic ovarian syndrome)   . Cancer     vaginal  malignant carcinoma  . Alcohol abuse   . Drug-seeking behavior   . Polysubstance abuse    Past Surgical History  Procedure Laterality Date  . Back surgery    . Cesarean section    . Tonsillectomy    . Tubal ligation    . Fracture surgery     Family History  Problem Relation Age of Onset  . Arthritis Mother   . Clotting disorder Mother   . Hyperlipidemia Father   . Leukemia Maternal Grandmother   . Diabetes Maternal Grandfather   . Stroke Paternal Grandmother   . Heart disease Father   . Diabetes Mother   . Hypertension Mother   . Prostate cancer Father   . Heart disease Paternal Grandmother    History  Substance Use Topics  . Smoking status: Current Every [place] Smoker -- 1.00 packs/[place] for 20 years    Types: Cigarettes  . Smokeless tobacco: Never  Used  . Alcohol Use: No     Comment: denies alcohol since 12/16   OB History    Gravida Para Term Preterm AB TAB SAB Ectopic Multiple Living   1 1 1             Review of Systems  Gastrointestinal: Positive for nausea, vomiting and abdominal pain.  All other systems reviewed and are negative.     Allergies  Penicillins and Quinolones  Home Medications   Prior to Admission medications   Medication Sig Start Date End Date Taking? Authorizing Provider  furosemide (LASIX) 40 MG tablet Take 40 mg by mouth 2 (two) times daily as needed for fluid.   Yes Historical Provider, MD  gabapentin (NEURONTIN) 400 MG capsule Take 800 mg by mouth 4 (four) times daily.   Yes Historical Provider, MD  HYDROcodone-acetaminophen (NORCO/VICODIN) 5-325 MG per tablet Take 1-2 tablets by mouth every 6 (six) hours as needed for moderate pain or severe pain. 07/31/14  Yes Tatyana A Kirichenko, PA-C  levothyroxine (SYNTHROID, LEVOTHROID) 50 MCG tablet TAKE ONE TABLET BY MOUTH ONCE DAILY BEFORE  BREAKFAST Patient taking differently: Take 50 mcg by mouth daily before breakfast.  06/27/14  Yes Benjamine Mola,  FNP  methocarbamol (ROBAXIN) 500 MG tablet Take 1 tablet (500 mg total) by mouth every 8 (eight) hours as needed for muscle spasms. 06/27/14  Yes Benjamine Mola, FNP  mirtazapine (REMERON) 30 MG tablet Take 1 tablet (30 mg total) by mouth at bedtime. 06/27/14  Yes Benjamine Mola, FNP  oxyCODONE-acetaminophen (ROXICET) 5-325 MG per tablet Take 2 tablets by mouth every 6 (six) hours as needed for severe pain. 07/18/14  Yes Nishant Dhungel, MD  PARoxetine (PAXIL) 40 MG tablet Take 40 mg by mouth every morning.   Yes Historical Provider, MD  promethazine (PHENERGAN) 25 MG tablet Take 1 tablet (25 mg total) by mouth every 6 (six) hours as needed for nausea or vomiting. 07/31/14  Yes Tatyana A Kirichenko, PA-C  zolpidem (AMBIEN) 10 MG tablet Take 10 mg by mouth at bedtime as needed for sleep.   Yes Historical Provider, MD   HYDROmorphone (DILAUDID) 2 MG tablet Take 1 tablet (2 mg total) by mouth every 12 (twelve) hours as needed for severe pain. Patient not taking: Reported on 08/16/2014 07/18/14   Flonnie Overman Dhungel, MD  hydrOXYzine (ATARAX/VISTARIL) 25 MG tablet Take 1 tablet (25 mg total) by mouth every 6 (six) hours as needed for anxiety. Patient not taking: Reported on 08/16/2014 06/27/14   Benjamine Mola, FNP  PARoxetine (PAXIL) 40 MG tablet Take 1 tablet (40 mg total) by mouth daily. NO MORE REFILLS WITHOUT OFFICE VISIT - 2ND NOTICE Patient not taking: Reported on 08/16/2014 08/10/14   Leandrew Koyanagi, MD   BP 146/90 mmHg  Pulse 93  Temp(Src) 99 F (37.2 C) (Oral)  Resp 14  Ht 5\' 7"  (1.702 m)  Wt 215 lb (97.523 kg)  BMI 33.67 kg/m2  SpO2 96% Physical Exam  Constitutional: She is oriented to person, place, and time. She appears well-developed and well-nourished.  Morbidly obese  HENT:  Head: Normocephalic.  Right Ear: External ear normal.  Left Ear: External ear normal.  Nose: Nose normal.  Mouth/Throat: Oropharynx is clear and moist.  Eyes: Conjunctivae are normal. Pupils are equal, round, and reactive to light.  Neck: Normal range of motion.  Cardiovascular: Normal rate and normal heart sounds.   Pulmonary/Chest: Effort normal and breath sounds normal.  Abdominal: Soft.  Diffuse tenderness to palpation Abdomen is soft sounds are present no masses, guarding, or rebound are palpated.  Musculoskeletal: Normal range of motion.  Neurological: She is alert and oriented to person, place, and time.  Skin: Skin is warm.  Psychiatric: Her speech is normal. Her affect is labile.  Nursing note and vitals reviewed.   ED Course  Procedures (including critical care time) Labs Review Labs Reviewed  CBC WITH DIFFERENTIAL  COMPREHENSIVE METABOLIC PANEL  LIPASE, BLOOD  I-STAT CG4 LACTIC ACID, ED     MDM   Final diagnoses:  Abdominal pain   Reviewed patient's previous records and note that she  has history of multiple visits for chronic back pain and etoh abuse andpolysubstance abuse.  Patient to be managed conservatively pending labs and ct report.  Patient left after my initial evaluation- nurse states she said she was going somewhere where she would get narcotics.    Shaune Pollack, MD 08/17/14 [phone]

## 2014-08-16 NOTE — ED Notes (Signed)
Pt in c/o abd pain with n/v/d, recently admitted for pancreatitis and states this feels the same, also reports RLQ pain, also fever at home, took ibuprofen PTA

## 2014-08-16 NOTE — ED Notes (Signed)
Pt left AMA. Pt states, "I'm going somewhere where they will treat me seriously and treat me with narcotics."

## 2014-08-20 ENCOUNTER — Encounter (HOSPITAL_COMMUNITY): Payer: Self-pay | Admitting: *Deleted

## 2014-08-20 ENCOUNTER — Emergency Department (HOSPITAL_COMMUNITY)
Admission: EM | Admit: 2014-08-20 | Discharge: 2014-08-20 | Disposition: A | Discharge status: Home / Self Care | Payer: Medicare Other | Attending: Emergency Medicine | Admitting: Emergency Medicine

## 2014-08-20 DIAGNOSIS — G8929 Other chronic pain: Secondary | ICD-10-CM | POA: Insufficient documentation

## 2014-08-20 DIAGNOSIS — M7989 Other specified soft tissue disorders: Secondary | ICD-10-CM

## 2014-08-20 DIAGNOSIS — Z720 Tobacco use: Secondary | ICD-10-CM | POA: Diagnosis not present

## 2014-08-20 DIAGNOSIS — G4730 Sleep apnea, unspecified: Secondary | ICD-10-CM | POA: Insufficient documentation

## 2014-08-20 DIAGNOSIS — Z880 Allergy status to penicillin: Secondary | ICD-10-CM | POA: Insufficient documentation

## 2014-08-20 DIAGNOSIS — Z79899 Other long term (current) drug therapy: Secondary | ICD-10-CM | POA: Insufficient documentation

## 2014-08-20 DIAGNOSIS — G629 Polyneuropathy, unspecified: Secondary | ICD-10-CM

## 2014-08-20 DIAGNOSIS — Z8719 Personal history of other diseases of the digestive system: Secondary | ICD-10-CM | POA: Diagnosis not present

## 2014-08-20 DIAGNOSIS — G608 Other hereditary and idiopathic neuropathies: Secondary | ICD-10-CM

## 2014-08-20 DIAGNOSIS — G9009 Other idiopathic peripheral autonomic neuropathy: Secondary | ICD-10-CM | POA: Insufficient documentation

## 2014-08-20 DIAGNOSIS — F1110 Opioid abuse, uncomplicated: Secondary | ICD-10-CM | POA: Insufficient documentation

## 2014-08-20 DIAGNOSIS — F419 Anxiety disorder, unspecified: Secondary | ICD-10-CM | POA: Diagnosis not present

## 2014-08-20 DIAGNOSIS — F1310 Sedative, hypnotic or anxiolytic abuse, uncomplicated: Secondary | ICD-10-CM | POA: Insufficient documentation

## 2014-08-20 DIAGNOSIS — M1990 Unspecified osteoarthritis, unspecified site: Secondary | ICD-10-CM | POA: Diagnosis not present

## 2014-08-20 DIAGNOSIS — R200 Anesthesia of skin: Secondary | ICD-10-CM | POA: Diagnosis present

## 2014-08-20 DIAGNOSIS — E039 Hypothyroidism, unspecified: Secondary | ICD-10-CM | POA: Insufficient documentation

## 2014-08-20 DIAGNOSIS — M79609 Pain in unspecified limb: Secondary | ICD-10-CM

## 2014-08-20 DIAGNOSIS — R110 Nausea: Secondary | ICD-10-CM | POA: Insufficient documentation

## 2014-08-20 LAB — URINALYSIS, ROUTINE W REFLEX MICROSCOPIC
BILIRUBIN URINE: NEGATIVE
GLUCOSE, UA: NEGATIVE mg/dL
Hgb urine dipstick: NEGATIVE
KETONES UR: NEGATIVE mg/dL
Leukocytes, UA: NEGATIVE
Nitrite: NEGATIVE
PROTEIN: NEGATIVE mg/dL
SPECIFIC GRAVITY, URINE: 1.019 (ref 1.005–1.030)
UROBILINOGEN UA: 0.2 mg/dL (ref 0.0–1.0)
pH: 5 (ref 5.0–8.0)

## 2014-08-20 LAB — COMPREHENSIVE METABOLIC PANEL
ALK PHOS: 50 U/L (ref 39–117)
ALT: 34 U/L (ref 0–35)
AST: 28 U/L (ref 0–37)
Albumin: 3.8 g/dL (ref 3.5–5.2)
Anion gap: 4 — ABNORMAL LOW (ref 5–15)
BUN: 17 mg/dL (ref 6–23)
CALCIUM: 8.7 mg/dL (ref 8.4–10.5)
CHLORIDE: 104 mmol/L (ref 96–112)
CO2: 30 mmol/L (ref 19–32)
CREATININE: 1.54 mg/dL — AB (ref 0.50–1.10)
GFR calc Af Amer: 48 mL/min — ABNORMAL LOW (ref >90)
GFR calc non Af Amer: 41 mL/min — ABNORMAL LOW (ref >90)
Glucose, Bld: 103 mg/dL — ABNORMAL HIGH (ref 70–99)
POTASSIUM: 5.2 mmol/L — AB (ref 3.5–5.1)
SODIUM: 138 mmol/L (ref 135–145)
Total Bilirubin: 0.4 mg/dL (ref 0.3–1.2)
Total Protein: 7.2 g/dL (ref 6.0–8.3)

## 2014-08-20 LAB — BASIC METABOLIC PANEL
Anion gap: 10 (ref 5–15)
BUN: 16 mg/dL (ref 6–23)
CALCIUM: 8.4 mg/dL (ref 8.4–10.5)
CHLORIDE: 102 mmol/L (ref 96–112)
CO2: 24 mmol/L (ref 19–32)
Creatinine, Ser: 1.32 mg/dL — ABNORMAL HIGH (ref 0.50–1.10)
GFR calc Af Amer: 58 mL/min — ABNORMAL LOW (ref >90)
GFR, EST NON AFRICAN AMERICAN: 50 mL/min — AB (ref >90)
Glucose, Bld: 103 mg/dL — ABNORMAL HIGH (ref 70–99)
Potassium: 4.9 mmol/L (ref 3.5–5.1)
Sodium: 136 mmol/L (ref 135–145)

## 2014-08-20 LAB — CBC WITH DIFFERENTIAL/PLATELET
BASOS ABS: 0 10*3/uL (ref 0.0–0.1)
BASOS PCT: 0 % (ref 0–1)
EOS PCT: 0 % (ref 0–5)
Eosinophils Absolute: 0 10*3/uL (ref 0.0–0.7)
HEMATOCRIT: 37.1 % (ref 36.0–46.0)
Hemoglobin: 12.3 g/dL (ref 12.0–15.0)
Lymphocytes Relative: 8 % — ABNORMAL LOW (ref 12–46)
Lymphs Abs: 1.1 10*3/uL (ref 0.7–4.0)
MCH: 31.4 pg (ref 26.0–34.0)
MCHC: 33.2 g/dL (ref 30.0–36.0)
MCV: 94.6 fL (ref 78.0–100.0)
MONOS PCT: 5 % (ref 3–12)
Monocytes Absolute: 0.7 10*3/uL (ref 0.1–1.0)
NEUTROS ABS: 12.1 10*3/uL — AB (ref 1.7–7.7)
Neutrophils Relative %: 87 % — ABNORMAL HIGH (ref 43–77)
PLATELETS: 184 10*3/uL (ref 150–400)
RBC: 3.92 MIL/uL (ref 3.87–5.11)
RDW: 14.9 % (ref 11.5–15.5)
WBC: 14 10*3/uL — ABNORMAL HIGH (ref 4.0–10.5)

## 2014-08-20 LAB — RAPID URINE DRUG SCREEN, HOSP PERFORMED
Amphetamines: NOT DETECTED
BARBITURATES: POSITIVE — AB
BENZODIAZEPINES: NOT DETECTED
COCAINE: NOT DETECTED
OPIATES: POSITIVE — AB
TETRAHYDROCANNABINOL: NOT DETECTED

## 2014-08-20 LAB — PROTIME-INR
INR: 1.15 (ref 0.00–1.49)
PROTHROMBIN TIME: 14.9 s (ref 11.6–15.2)

## 2014-08-20 LAB — CK: Total CK: 393 U/L — ABNORMAL HIGH (ref 7–177)

## 2014-08-20 MED ORDER — ONDANSETRON 4 MG PO TBDP
8.0000 mg | ORAL_TABLET | Freq: Once | ORAL | Status: AC
  Administered 2014-08-20: 8 mg via ORAL
  Filled 2014-08-20: qty 2

## 2014-08-20 MED ORDER — SODIUM CHLORIDE 0.9 % IV SOLN
1000.0000 mL | Freq: Once | INTRAVENOUS | Status: AC
  Administered 2014-08-20: 1000 mL via INTRAVENOUS

## 2014-08-20 MED ORDER — NICOTINE 14 MG/24HR TD PT24
14.0000 mg | MEDICATED_PATCH | Freq: Once | TRANSDERMAL | Status: DC
  Administered 2014-08-20: 14 mg via TRANSDERMAL
  Filled 2014-08-20: qty 1

## 2014-08-20 MED ORDER — ASPIRIN EC 325 MG PO TBEC
325.0000 mg | DELAYED_RELEASE_TABLET | Freq: Every day | ORAL | Status: DC
  Administered 2014-08-20: 325 mg via ORAL
  Filled 2014-08-20: qty 1

## 2014-08-20 MED ORDER — SODIUM CHLORIDE 0.9 % IV SOLN: 1000.0000 mL | INTRAVENOUS | Status: DC

## 2014-08-20 NOTE — ED Provider Notes (Signed)
CSN: [id number]     Arrival date & time 08/20/14  8889 History  This chart was scribed for Kalman Drape, MD by Chester Holstein, ED Scribe. This patient was seen in room A10C/A10C and the patient's care was started at 4:18 AM.    Chief Complaint  Patient presents with  . Numbness     The history is provided by the patient. No language interpreter was used.    HPI Comments: [name] is a 39 y.o. female who presents to the Emergency Department with PMHx of hypothyrodism, neuropathy, arthritis, PCOS, and chronic back pain, complaining of numbness and tingling in left leg with onset at 10 PM.  Pt notes she sat with her leg tucked under her for 2-3 hours prior to onset. Pt notes associated pain and swelling behind her knee. Pt notes headache and nausea.  She notes neuropathy at baseline but states this is different than normal. Pt takes gabapentin, ibuprofen, Robaxin, and levothyroxine. Pt denies any other pain or injury.  Patient reports she has patchy areas of pins and needle sensation, other areas she has no sensation whatsoever.  Patient reports she called her primary care doctor who told her come to the ER for a vascular Doppler study to make sure there was no blood clot.  Past Medical History  Diagnosis Date  . Thyroid disease   . Chronic back pain   . Hypothyroidism   . Anxiety   . GERD (gastroesophageal reflux disease)   . Sleep apnea     cpap  . Headache(784.0)   . Neuromuscular disorder     neuropathy  . Arthritis     knees bilateral   DJD  stenosis   . Blood dyscrasia     HSV  . PCOS (polycystic ovarian syndrome)   . Cancer     vaginal  malignant carcinoma  . Alcohol abuse   . Drug-seeking behavior   . Polysubstance abuse    Past Surgical History  Procedure Laterality Date  . Back surgery    . Cesarean section    . Tonsillectomy    . Tubal ligation    . Fracture surgery     Family History  Problem Relation Age of Onset  . Arthritis Mother   . Clotting disorder  Mother   . Hyperlipidemia Father   . Leukemia Maternal Grandmother   . Diabetes Maternal Grandfather   . Stroke Paternal Grandmother   . Heart disease Father   . Diabetes Mother   . Hypertension Mother   . Prostate cancer Father   . Heart disease Paternal Grandmother    History  Substance Use Topics  . Smoking status: Current Every Lear Smoker -- 1.00 packs/Malena for 20 years    Types: Cigarettes  . Smokeless tobacco: Never Used  . Alcohol Use: No     Comment: denies alcohol since 12/16   OB History    Gravida Para Term Preterm AB TAB SAB Ectopic Multiple Living   1 1 1             Review of Systems  Gastrointestinal: Positive for nausea.  Musculoskeletal: Positive for arthralgias.  Neurological: Positive for numbness and headaches.  All other systems reviewed and are negative.     Allergies  Penicillins and Quinolones  Home Medications   Prior to Admission medications   Medication Sig Start Date End Date Taking? Authorizing Provider  furosemide (LASIX) 40 MG tablet Take 40 mg by mouth 2 (two) times daily as needed for fluid.  Historical Provider, MD  gabapentin (NEURONTIN) 400 MG capsule Take 800 mg by mouth 4 (four) times daily.    Historical Provider, MD  HYDROcodone-acetaminophen (NORCO/VICODIN) 5-325 MG per tablet Take 1-2 tablets by mouth every 6 (six) hours as needed for moderate pain or severe pain. 07/31/14   Tatyana A Kirichenko, PA-C  HYDROmorphone (DILAUDID) 2 MG tablet Take 1 tablet (2 mg total) by mouth every 12 (twelve) hours as needed for severe pain. Patient not taking: Reported on 08/16/2014 07/18/14   Flonnie Overman Dhungel, MD  hydrOXYzine (ATARAX/VISTARIL) 25 MG tablet Take 1 tablet (25 mg total) by mouth every 6 (six) hours as needed for anxiety. Patient not taking: Reported on 08/16/2014 06/27/14   Benjamine Mola, FNP  levothyroxine (SYNTHROID, LEVOTHROID) 50 MCG tablet TAKE ONE TABLET BY MOUTH ONCE DAILY BEFORE  BREAKFAST Patient taking differently: Take 50  mcg by mouth daily before breakfast.  06/27/14   Benjamine Mola, FNP  methocarbamol (ROBAXIN) 500 MG tablet Take 1 tablet (500 mg total) by mouth every 8 (eight) hours as needed for muscle spasms. 06/27/14   Benjamine Mola, FNP  mirtazapine (REMERON) 30 MG tablet Take 1 tablet (30 mg total) by mouth at bedtime. 06/27/14   Benjamine Mola, FNP  oxyCODONE-acetaminophen (ROXICET) 5-325 MG per tablet Take 2 tablets by mouth every 6 (six) hours as needed for severe pain. 07/18/14   Nishant Dhungel, MD  PARoxetine (PAXIL) 40 MG tablet Take 40 mg by mouth every morning.    Historical Provider, MD  PARoxetine (PAXIL) 40 MG tablet Take 1 tablet (40 mg total) by mouth daily. NO MORE REFILLS WITHOUT OFFICE VISIT - 2ND NOTICE Patient not taking: Reported on 08/16/2014 08/10/14   Leandrew Koyanagi, MD  promethazine (PHENERGAN) 25 MG tablet Take 1 tablet (25 mg total) by mouth every 6 (six) hours as needed for nausea or vomiting. 07/31/14   Tatyana A Kirichenko, PA-C  zolpidem (AMBIEN) 10 MG tablet Take 10 mg by mouth at bedtime as needed for sleep.    Historical Provider, MD   BP 113/94 mmHg  Pulse 107  Temp(Src) 98.8 F (37.1 C) (Oral)  Resp 16  Ht 5\' 7"  (1.702 m)  Wt 315 lb (142.883 kg)  BMI 49.32 kg/m2  SpO2 95% Physical Exam  Constitutional: She is oriented to person, place, and time. She appears well-developed and well-nourished. No distress.  HENT:  Head: Normocephalic and atraumatic.  Nose: Nose normal.  Mouth/Throat: Oropharynx is clear and moist.  Eyes: Conjunctivae and EOM are normal. Pupils are equal, round, and reactive to light.  Neck: Normal range of motion. Neck supple. No JVD present. No tracheal deviation present. No thyromegaly present.  Cardiovascular: Normal rate, regular rhythm, normal heart sounds and intact distal pulses.  Exam reveals no gallop and no friction rub.   No murmur heard. Distal pulses intact  Pulmonary/Chest: Effort normal and breath sounds normal. No stridor. No  respiratory distress. She has no wheezes. She has no rales. She exhibits no tenderness.  Abdominal: Soft. Bowel sounds are normal. She exhibits no distension and no mass. There is no tenderness. There is no rebound and no guarding.  Musculoskeletal: Normal range of motion. She exhibits tenderness (patient has tenderness to palpation in left popliteal fossa.  Pulses are palpable, Refill less than 2.  Patient reports decreased range of motion at toes, but not visualized on physical exam). She exhibits no edema.  Legs are similar in size and color.  Patient has decreased sensation to medial  side of left lower leg from knee to toe, and reports no sensation to lateral aspect of left lower leg from knee to foot.  No foot drop noted.  Lymphadenopathy:    She has no cervical adenopathy.  Neurological: She is alert and oriented to person, place, and time. She displays normal reflexes. She exhibits normal muscle tone. Coordination normal.  Skin: Skin is warm and dry. No rash noted. No erythema. No pallor.  Psychiatric: She has a normal mood and affect. Her behavior is normal. Judgment and thought content normal.  Nursing note and vitals reviewed.   ED Course  Procedures (including critical care time) DIAGNOSTIC STUDIES: Oxygen Saturation is 95% on room air, normal by my interpretation.    COORDINATION OF CARE: 4:25 AM Discussed treatment plan with patient at beside, the patient agrees with the plan and has no further questions at this time.   Labs Review Labs Reviewed  CK - Abnormal; Notable for the following:    Total CK 393 (*)    All other components within normal limits  COMPREHENSIVE METABOLIC PANEL - Abnormal; Notable for the following:    Potassium 5.2 (*)    Glucose, Bld 103 (*)    Creatinine, Ser 1.54 (*)    GFR calc non Af Amer 41 (*)    GFR calc Af Amer 48 (*)    Anion gap 4 (*)    All other components within normal limits  CBC WITH DIFFERENTIAL/PLATELET - Abnormal; Notable for the  following:    WBC 14.0 (*)    Neutrophils Relative % 87 (*)    Neutro Abs 12.1 (*)    Lymphocytes Relative 8 (*)    All other components within normal limits  PROTIME-INR  URINE RAPID DRUG SCREEN (HOSP PERFORMED)  URINALYSIS, ROUTINE W REFLEX MICROSCOPIC    Imaging Review No results found.   EKG Interpretation None      MDM   Final diagnoses:  None    I personally performed the services described in this documentation, which was scribed in my presence. The recorded information has been reviewed and is accurate.   39 year old female with probable peripheral nerve injury due to abnormal positioning.  Workup for possible rhabdo completed, CK mildly elevated, creatinine elevated from her most recent values.  Plan to do IV hydration and recheck BMP.  As patient has tenderness and left popliteal fossa.  Will get peripheral Doppler in the morning.  She is complaining of nausea and headache, I do not wish to treat with Toradol given her mild kidney injury, patient refuses Benadryl and Reglan as it makes her restless legs worse.  Will reassess after 2 L fluid given.  Patient is eating a [place] sandwich and drinking ginger ale without difficulties and is having no vomiting.  Kalman Drape, MD 08/20/14 [phone]

## 2014-08-20 NOTE — Progress Notes (Signed)
VASCULAR LAB PRELIMINARY  PRELIMINARY  PRELIMINARY  PRELIMINARY  Left lower extremity venous duplex completed.    Preliminary report:  Right:  No evidence of DVT, superficial thrombosis, or Baker's cyst. FYI - Pedal arterial Dopplers and waveforms were triphasic.  Kaylei Frink, RVS 08/20/2014, 10:41 AM

## 2014-08-20 NOTE — Discharge Instructions (Signed)
If you were given medicines take as directed.  If you are on coumadin or contraceptives realize their levels and effectiveness is altered by many different medicines.  If you have any reaction (rash, tongues swelling, other) to the medicines stop taking and see a physician.   Please follow up as directed and return to the ER or see a physician for new or worsening symptoms.  Thank you. Filed Vitals:   08/20/14 0600 08/20/14 0730 08/20/14 0919 08/20/14 0920  BP: 99/85 119/72 108/82   Pulse: 87 89  96  Temp:      TempSrc:      Resp:      Height:      Weight:      SpO2: 91% 92%  96%

## 2014-08-20 NOTE — ED Provider Notes (Signed)
Patient's care signed out to follow-up ultrasound result for DVT for final disposition. Ultrasound no acute DVT visualized. On exam patient has decreased sensation lateral left lower leg consistent with peripheral neuropathy. Patient has 5+ strength with great toe flexion and dorsiflexion, ankle dorsiflexion, knee extension, left leg flexion.  Discussed close outpatient follow-up and discussed results.  Peripheral neuropathy, left lower extremity pain  Mariea Clonts, MD 08/20/14 [phone]

## 2014-08-20 NOTE — ED Notes (Signed)
Pt. Fell asleep last night with her leg bent and tucked under her. Pt. Woke up at 10pm with her leg feeling numb. Pt. Called EMS because the numbness has not gone away

## 2014-08-23 ENCOUNTER — Encounter (HOSPITAL_COMMUNITY): Payer: Self-pay | Admitting: Emergency Medicine

## 2014-08-23 ENCOUNTER — Emergency Department (HOSPITAL_COMMUNITY)
Admission: EM | Admit: 2014-08-23 | Discharge: 2014-08-23 | Disposition: A | Discharge status: Home / Self Care | Payer: Medicare Other | Attending: Emergency Medicine | Admitting: Emergency Medicine

## 2014-08-23 DIAGNOSIS — Z720 Tobacco use: Secondary | ICD-10-CM | POA: Insufficient documentation

## 2014-08-23 DIAGNOSIS — F419 Anxiety disorder, unspecified: Secondary | ICD-10-CM | POA: Diagnosis not present

## 2014-08-23 DIAGNOSIS — R1084 Generalized abdominal pain: Secondary | ICD-10-CM

## 2014-08-23 DIAGNOSIS — Z862 Personal history of diseases of the blood and blood-forming organs and certain disorders involving the immune mechanism: Secondary | ICD-10-CM | POA: Insufficient documentation

## 2014-08-23 DIAGNOSIS — G4730 Sleep apnea, unspecified: Secondary | ICD-10-CM | POA: Diagnosis not present

## 2014-08-23 DIAGNOSIS — G8929 Other chronic pain: Secondary | ICD-10-CM | POA: Diagnosis not present

## 2014-08-23 DIAGNOSIS — Z79899 Other long term (current) drug therapy: Secondary | ICD-10-CM | POA: Diagnosis not present

## 2014-08-23 DIAGNOSIS — R197 Diarrhea, unspecified: Secondary | ICD-10-CM | POA: Insufficient documentation

## 2014-08-23 DIAGNOSIS — M549 Dorsalgia, unspecified: Secondary | ICD-10-CM | POA: Diagnosis not present

## 2014-08-23 DIAGNOSIS — E039 Hypothyroidism, unspecified: Secondary | ICD-10-CM | POA: Insufficient documentation

## 2014-08-23 DIAGNOSIS — Z9981 Dependence on supplemental oxygen: Secondary | ICD-10-CM | POA: Diagnosis not present

## 2014-08-23 DIAGNOSIS — R1013 Epigastric pain: Secondary | ICD-10-CM | POA: Diagnosis not present

## 2014-08-23 DIAGNOSIS — R112 Nausea with vomiting, unspecified: Secondary | ICD-10-CM | POA: Insufficient documentation

## 2014-08-23 DIAGNOSIS — Z880 Allergy status to penicillin: Secondary | ICD-10-CM | POA: Diagnosis not present

## 2014-08-23 DIAGNOSIS — Z9851 Tubal ligation status: Secondary | ICD-10-CM | POA: Diagnosis not present

## 2014-08-23 DIAGNOSIS — Z8541 Personal history of malignant neoplasm of cervix uteri: Secondary | ICD-10-CM | POA: Insufficient documentation

## 2014-08-23 DIAGNOSIS — R1011 Right upper quadrant pain: Secondary | ICD-10-CM | POA: Diagnosis present

## 2014-08-23 LAB — CBC WITH DIFFERENTIAL/PLATELET
Basophils Absolute: 0 10*3/uL (ref 0.0–0.1)
Basophils Relative: 1 % (ref 0–1)
Eosinophils Absolute: 0.1 10*3/uL (ref 0.0–0.7)
Eosinophils Relative: 1 % (ref 0–5)
HCT: 39.8 % (ref 36.0–46.0)
Hemoglobin: 13.3 g/dL (ref 12.0–15.0)
Lymphocytes Relative: 23 % (ref 12–46)
Lymphs Abs: 1.8 10*3/uL (ref 0.7–4.0)
MCH: 31.1 pg (ref 26.0–34.0)
MCHC: 33.4 g/dL (ref 30.0–36.0)
MCV: 93 fL (ref 78.0–100.0)
Monocytes Absolute: 0.6 10*3/uL (ref 0.1–1.0)
Monocytes Relative: 7 % (ref 3–12)
Neutro Abs: 5.2 10*3/uL (ref 1.7–7.7)
Neutrophils Relative %: 68 % (ref 43–77)
Platelets: 191 10*3/uL (ref 150–400)
RBC: 4.28 MIL/uL (ref 3.87–5.11)
RDW: 14.4 % (ref 11.5–15.5)
WBC: 7.7 10*3/uL (ref 4.0–10.5)

## 2014-08-23 LAB — COMPREHENSIVE METABOLIC PANEL
ALT: 23 U/L (ref 0–35)
AST: 25 U/L (ref 0–37)
Albumin: 3.9 g/dL (ref 3.5–5.2)
Alkaline Phosphatase: 54 U/L (ref 39–117)
Anion gap: 9 (ref 5–15)
BUN: 13 mg/dL (ref 6–23)
CO2: 24 mmol/L (ref 19–32)
Calcium: 9.1 mg/dL (ref 8.4–10.5)
Chloride: 104 mmol/L (ref 96–112)
Creatinine, Ser: 1.02 mg/dL (ref 0.50–1.10)
GFR calc Af Amer: 79 mL/min — ABNORMAL LOW (ref >90)
GFR calc non Af Amer: 68 mL/min — ABNORMAL LOW (ref >90)
Glucose, Bld: 89 mg/dL (ref 70–99)
Potassium: 3.8 mmol/L (ref 3.5–5.1)
Sodium: 137 mmol/L (ref 135–145)
Total Bilirubin: 0.5 mg/dL (ref 0.3–1.2)
Total Protein: 7 g/dL (ref 6.0–8.3)

## 2014-08-23 LAB — POC URINE PREG, ED: PREG TEST UR: NEGATIVE

## 2014-08-23 LAB — URINALYSIS, ROUTINE W REFLEX MICROSCOPIC
Bilirubin Urine: NEGATIVE
Glucose, UA: NEGATIVE mg/dL
Hgb urine dipstick: NEGATIVE
Ketones, ur: NEGATIVE mg/dL
Leukocytes, UA: NEGATIVE
Nitrite: NEGATIVE
Protein, ur: 30 mg/dL — AB
Specific Gravity, Urine: 1.039 — ABNORMAL HIGH (ref 1.005–1.030)
Urobilinogen, UA: 0.2 mg/dL (ref 0.0–1.0)
pH: 6 (ref 5.0–8.0)

## 2014-08-23 LAB — LIPASE, BLOOD: Lipase: 20 U/L (ref 11–59)

## 2014-08-23 LAB — URINE MICROSCOPIC-ADD ON

## 2014-08-23 LAB — I-STAT TROPONIN, ED: Troponin i, poc: 0 ng/mL (ref 0.00–0.08)

## 2014-08-23 MED ORDER — ONDANSETRON HCL 4 MG/2ML IJ SOLN
4.0000 mg | Freq: Once | INTRAMUSCULAR | Status: AC
  Administered 2014-08-23: 4 mg via INTRAVENOUS
  Filled 2014-08-23: qty 2

## 2014-08-23 MED ORDER — HYDROMORPHONE HCL 1 MG/ML IJ SOLN
1.0000 mg | Freq: Once | INTRAMUSCULAR | Status: AC
  Administered 2014-08-23: 1 mg via INTRAVENOUS
  Filled 2014-08-23: qty 1

## 2014-08-23 MED ORDER — PANTOPRAZOLE SODIUM 20 MG PO TBEC: 20.0000 mg | DELAYED_RELEASE_TABLET | Freq: Every day | ORAL | Status: DC

## 2014-08-23 MED ORDER — SODIUM CHLORIDE 0.9 % IV BOLUS (SEPSIS)
1000.0000 mL | Freq: Once | INTRAVENOUS | Status: AC
  Administered 2014-08-23: 1000 mL via INTRAVENOUS

## 2014-08-23 MED ORDER — OXYCODONE-ACETAMINOPHEN 5-325 MG PO TABS
1.0000 | ORAL_TABLET | Freq: Once | ORAL | Status: AC
  Administered 2014-08-23: 1 via ORAL
  Filled 2014-08-23: qty 1

## 2014-08-23 MED ORDER — TRAMADOL HCL 50 MG PO TABS: 100.0000 mg | ORAL_TABLET | Freq: Four times a day (QID) | ORAL | Status: DC | PRN

## 2014-08-23 MED ORDER — SUCRALFATE 1 G PO TABS: 1.0000 g | ORAL_TABLET | Freq: Three times a day (TID) | ORAL | Status: DC

## 2014-08-23 NOTE — ED Notes (Signed)
Per EMS, Pt from home c/o generalized abdominal pain x 3 days.  Pt sts she thought it felt like her appendix again. C/o fever, N/V, none present for EMS. Ambulatory. A&Ox4. Pt took acetaminophen yesterday without relief.

## 2014-08-23 NOTE — Discharge Instructions (Signed)
Abdominal Pain, Women °Abdominal (stomach, pelvic, or belly) pain can be caused by many things. It is important to tell your doctor: °· The location of the pain. °· Does it come and go or is it present all the time? °· Are there things that start the pain (eating certain foods, exercise)? °· Are there other symptoms associated with the pain (fever, nausea, vomiting, diarrhea)? °All of this is helpful to know when trying to find the cause of the pain. °CAUSES  °· Stomach: virus or bacteria infection, or ulcer. °· Intestine: appendicitis (inflamed appendix), regional ileitis (Crohn's disease), ulcerative colitis (inflamed colon), irritable bowel syndrome, diverticulitis (inflamed diverticulum of the colon), or cancer of the stomach or intestine. °· Gallbladder disease or stones in the gallbladder. °· Kidney disease, kidney stones, or infection. °· Pancreas infection or cancer. °· Fibromyalgia (pain disorder). °· Diseases of the female organs: °¨ Uterus: fibroid (non-cancerous) tumors or infection. °¨ Fallopian tubes: infection or tubal pregnancy. °¨ Ovary: cysts or tumors. °¨ Pelvic adhesions (scar tissue). °¨ Endometriosis (uterus lining tissue growing in the pelvis and on the pelvic organs). °¨ Pelvic congestion syndrome (female organs filling up with blood just before the menstrual period). °¨ Pain with the menstrual period. °¨ Pain with ovulation (producing an egg). °¨ Pain with an IUD (intrauterine device, birth control) in the uterus. °¨ Cancer of the female organs. °· Functional pain (pain not caused by a disease, may improve without treatment). °· Psychological pain. °· Depression. °DIAGNOSIS  °Your doctor will decide the seriousness of your pain by doing an examination. °· Blood tests. °· X-rays. °· Ultrasound. °· CT scan (computed tomography, special type of X-ray). °· MRI (magnetic resonance imaging). °· Cultures, for infection. °· Barium enema (dye inserted in the large intestine, to better view it with  X-rays). °· Colonoscopy (looking in intestine with a lighted tube). °· Laparoscopy (minor surgery, looking in abdomen with a lighted tube). °· Major abdominal exploratory surgery (looking in abdomen with a large incision). °TREATMENT  °The treatment will depend on the cause of the pain.  °· Many cases can be observed and treated at home. °· Over-the-counter medicines recommended by your caregiver. °· Prescription medicine. °· Antibiotics, for infection. °· Birth control pills, for painful periods or for ovulation pain. °· Hormone treatment, for endometriosis. °· Nerve blocking injections. °· Physical therapy. °· Antidepressants. °· Counseling with a psychologist or psychiatrist. °· Minor or major surgery. °HOME CARE INSTRUCTIONS  °· Do not take laxatives, unless directed by your caregiver. °· Take over-the-counter pain medicine only if ordered by your caregiver. Do not take aspirin because it can cause an upset stomach or bleeding. °· Try a clear liquid diet (broth or water) as ordered by your caregiver. Slowly move to a bland diet, as tolerated, if the pain is related to the stomach or intestine. °· Have a thermometer and take your temperature several times a [place], and record it. °· Bed rest and sleep, if it helps the pain. °· Avoid sexual intercourse, if it causes pain. °· Avoid stressful situations. °· Keep your follow-up appointments and tests, as your caregiver orders. °· If the pain does not go away with medicine or surgery, you may try: °¨ Acupuncture. °¨ Relaxation exercises (yoga, meditation). °¨ Group therapy. °¨ Counseling. °SEEK MEDICAL CARE IF:  °· You notice certain foods cause stomach pain. °· Your home care treatment is not helping your pain. °· You need stronger pain medicine. °· You want your IUD removed. °· You feel faint or   lightheaded. °· You develop nausea and vomiting. °· You develop a rash. °· You are having side effects or an allergy to your medicine. °SEEK IMMEDIATE MEDICAL CARE IF:  °· Your  pain does not go away or gets worse. °· You have a fever. °· Your pain is felt only in portions of the abdomen. The right side could possibly be appendicitis. The left lower portion of the abdomen could be colitis or diverticulitis. °· You are passing blood in your stools (bright red or black tarry stools, with or without vomiting). °· You have blood in your urine. °· You develop chills, with or without a fever. °· You pass out. °MAKE SURE YOU:  °· Understand these instructions. °· Will watch your condition. °· Will get help right away if you are not doing well or get worse. °Document Released: 05/10/2007 Document Revised: 11/27/2013 Document Reviewed: 05/30/2009 °[employer]® Patient Information ©2015 [employer]. This information is not intended to replace advice given to you by your health care provider. Make sure you discuss any questions you have with your health care provider. ° °

## 2014-08-23 NOTE — ED Notes (Signed)
Pt aware urine sample is needed. Cup provided, pt unable to go at this time.

## 2014-08-23 NOTE — ED Notes (Signed)
Bed: AJ28 Expected date:  Expected time:  Means of arrival:  Comments: abd

## 2014-08-23 NOTE — ED Provider Notes (Signed)
CSN: [id number]     Arrival date & time 08/23/14  1535 History   First MD Initiated Contact with Patient 08/23/14 1539     Chief Complaint  Patient presents with  . Abdominal Pain  . Emesis   HPI  Patient is a 39 year old female with past medical history of GERD, neuropathy, PCOS, drug-seeking behavior, and polysubstance abuse who presents to the emergency room for evaluation of abdominal pain. Patient states that for the past 3 days she has been having right upper quadrant abdominal pain that is sharp jabbing is an 8 out of 10 that radiates to her back and her lower right quadrant. She states that this feels similar to when she had pancreatitis in the past. She denies any drinking. She also admits to nausea 5-10 episodes of vomiting per [place], and 8 bowel movements that are loose per [place]. Patient denies any blood in her stool or vomit. She denies melena.  Past Medical History  Diagnosis Date  . Thyroid disease   . Chronic back pain   . Hypothyroidism   . Anxiety   . GERD (gastroesophageal reflux disease)   . Sleep apnea     cpap  . Headache(784.0)   . Neuromuscular disorder     neuropathy  . Arthritis     knees bilateral   DJD  stenosis   . Blood dyscrasia     HSV  . PCOS (polycystic ovarian syndrome)   . Cancer     vaginal  malignant carcinoma  . Alcohol abuse   . Drug-seeking behavior   . Polysubstance abuse    Past Surgical History  Procedure Laterality Date  . Back surgery    . Cesarean section    . Tonsillectomy    . Tubal ligation    . Fracture surgery     Family History  Problem Relation Age of Onset  . Arthritis Mother   . Clotting disorder Mother   . Hyperlipidemia Father   . Leukemia Maternal Grandmother   . Diabetes Maternal Grandfather   . Stroke Paternal Grandmother   . Heart disease Father   . Diabetes Mother   . Hypertension Mother   . Prostate cancer Father   . Heart disease Paternal Grandmother    History  Substance Use Topics  . Smoking  status: Current Every [place] Smoker -- 1.00 packs/Crafts for 20 years    Types: Cigarettes  . Smokeless tobacco: Never Used  . Alcohol Use: No     Comment: denies alcohol since 12/16   OB History    Gravida Para Term Preterm AB TAB SAB Ectopic Multiple Living   1 1 1             Review of Systems  Constitutional: Negative for fever, chills and fatigue.  Respiratory: Negative for chest tightness and shortness of breath.   Cardiovascular: Negative for chest pain and palpitations.  Gastrointestinal: Positive for nausea, vomiting, abdominal pain and diarrhea. Negative for constipation, blood in stool, anal bleeding and rectal pain.  Genitourinary: Negative for dysuria, urgency, frequency, hematuria, vaginal bleeding, difficulty urinating and vaginal pain.  Musculoskeletal: Positive for back pain.  All other systems reviewed and are negative.     Allergies  Penicillins and Quinolones  Home Medications   Prior to Admission medications   Medication Sig Start Date End Date Taking? Authorizing Provider  gabapentin (NEURONTIN) 400 MG capsule Take 800 mg by mouth 4 (four) times daily.   Yes Historical Provider, MD  ibuprofen (ADVIL,MOTRIN) 200 MG tablet  Take 800 mg by mouth every 6 (six) hours as needed.   Yes Historical Provider, MD  levothyroxine (SYNTHROID, LEVOTHROID) 50 MCG tablet TAKE ONE TABLET BY MOUTH ONCE DAILY BEFORE  BREAKFAST Patient taking differently: Take 50 mcg by mouth daily before breakfast.  06/27/14  Yes Benjamine Mola, FNP  PARoxetine (PAXIL) 40 MG tablet Take 40 mg by mouth every morning.   Yes Historical Provider, MD  zolpidem (AMBIEN) 10 MG tablet Take 10 mg by mouth at bedtime as needed for sleep.   Yes Historical Provider, MD  HYDROcodone-acetaminophen (NORCO/VICODIN) 5-325 MG per tablet Take 1-2 tablets by mouth every 6 (six) hours as needed for moderate pain or severe pain. Patient not taking: Reported on 08/23/2014 07/31/14   Tatyana A Kirichenko, PA-C  HYDROmorphone  (DILAUDID) 2 MG tablet Take 1 tablet (2 mg total) by mouth every 12 (twelve) hours as needed for severe pain. Patient not taking: Reported on 08/16/2014 07/18/14   Flonnie Overman Dhungel, MD  hydrOXYzine (ATARAX/VISTARIL) 25 MG tablet Take 1 tablet (25 mg total) by mouth every 6 (six) hours as needed for anxiety. Patient not taking: Reported on 08/16/2014 06/27/14   Benjamine Mola, FNP  methocarbamol (ROBAXIN) 500 MG tablet Take 1 tablet (500 mg total) by mouth every 8 (eight) hours as needed for muscle spasms. Patient not taking: Reported on 08/20/2014 06/27/14   Benjamine Mola, FNP  mirtazapine (REMERON) 30 MG tablet Take 1 tablet (30 mg total) by mouth at bedtime. Patient not taking: Reported on 08/20/2014 06/27/14   Benjamine Mola, FNP  oxyCODONE-acetaminophen (ROXICET) 5-325 MG per tablet Take 2 tablets by mouth every 6 (six) hours as needed for severe pain. Patient not taking: Reported on 08/23/2014 07/18/14   Nishant Dhungel, MD  pantoprazole (PROTONIX) 20 MG tablet Take 1 tablet (20 mg total) by mouth daily. 08/23/14   Cheila Wickstrom A Forcucci, PA-C  PARoxetine (PAXIL) 40 MG tablet Take 1 tablet (40 mg total) by mouth daily. NO MORE REFILLS WITHOUT OFFICE VISIT - 2ND NOTICE Patient not taking: Reported on 08/23/2014 08/10/14   Leandrew Koyanagi, MD  promethazine (PHENERGAN) 25 MG tablet Take 1 tablet (25 mg total) by mouth every 6 (six) hours as needed for nausea or vomiting. Patient not taking: Reported on 08/20/2014 07/31/14   Tatyana A Kirichenko, PA-C  sucralfate (CARAFATE) 1 G tablet Take 1 tablet (1 g total) by mouth 4 (four) times daily -  with meals and at bedtime. 08/23/14   Yazeed Pryer A Forcucci, PA-C  traMADol (ULTRAM) 50 MG tablet Take 2 tablets (100 mg total) by mouth every 6 (six) hours as needed. 08/23/14   Keli Buehner A Forcucci, PA-C   BP 121/75 mmHg  Pulse 85  Temp(Src) 99.7 F (37.6 C) (Oral)  Resp 18  SpO2 98% Physical Exam  Constitutional: She is oriented to person, place, and time. She appears  well-developed and well-nourished. No distress.  HENT:  Head: Normocephalic and atraumatic.  Mouth/Throat: Oropharynx is clear and moist. No oropharyngeal exudate.  Eyes: Conjunctivae and EOM are normal. Pupils are equal, round, and reactive to light. No scleral icterus.  Neck: Normal range of motion. Neck supple. No JVD present. No thyromegaly present.  Cardiovascular: Normal rate, regular rhythm, normal heart sounds and intact distal pulses.  Exam reveals no gallop and no friction rub.   No murmur heard. Pulmonary/Chest: Effort normal and breath sounds normal. No respiratory distress. She has no wheezes. She has no rales. She exhibits no tenderness.  Abdominal: Soft. Normal appearance  and bowel sounds are normal. She exhibits no distension and no mass. There is tenderness in the right upper quadrant and epigastric area. There is no rigidity, no rebound, no guarding, no CVA tenderness, no tenderness at McBurney's point and negative Murphy's sign.  Musculoskeletal: Normal range of motion.  Lymphadenopathy:    She has no cervical adenopathy.  Neurological: She is alert and oriented to person, place, and time. No cranial nerve deficit. Coordination normal.  Skin: Skin is warm and dry. She is not diaphoretic.  Psychiatric: She has a normal mood and affect. Her behavior is normal. Judgment and thought content normal.  Nursing note and vitals reviewed.   ED Course  Procedures (including critical care time) Labs Review Labs Reviewed  COMPREHENSIVE METABOLIC PANEL - Abnormal; Notable for the following:    GFR calc non Af Amer 68 (*)    GFR calc Af Amer 79 (*)    All other components within normal limits  URINALYSIS, ROUTINE W REFLEX MICROSCOPIC - Abnormal; Notable for the following:    Color, Urine AMBER (*)    APPearance CLOUDY (*)    Specific Gravity, Urine 1.039 (*)    Protein, ur 30 (*)    All other components within normal limits  URINE MICROSCOPIC-ADD ON - Abnormal; Notable for the  following:    Bacteria, UA FEW (*)    All other components within normal limits  CBC WITH DIFFERENTIAL/PLATELET  LIPASE, BLOOD  POC URINE PREG, ED  I-STAT TROPOININ, ED    Imaging Review No results found.   EKG Interpretation None      MDM   Final diagnoses:  Generalized abdominal pain  Non-intractable vomiting with nausea, vomiting of unspecified type   Patient is a 39 year old female who presents emergency room for evaluation of abdominal pain, nausea vomiting and diarrhea. Physical exam reveals a generalized weak tender abdomen with no guarding, rigidity, or peritoneal signs. Chart review reveals normal right upper quadrant ultrasound performed in December of last year. CBC, CMP, lipase, urinalysis, and i-STAT troponin are without frank abnormalities. Patient given 1 L normal saline bolus, Zofran, and Dilaudid for pain management. Patient given oral Percocet prior to discharge. Patient tolerated by mouth's here. We'll discharge home with sucralfate, Protonix and will have her follow up with GI and her PCP. Patient to return for intractable nausea and vomiting, right upper quadrant abdominal pain, or any other concerning symptoms. She states understanding and agreement at this time. Patient stable for discharge. Patient discussed with Dr. Vanita Panda who agrees with the above workup and plan.    Cherylann Parr, PA-C 08/23/14 1957  Carmin Muskrat, MD 08/23/14 [phone]

## 2014-08-25 ENCOUNTER — Ambulatory Visit (INDEPENDENT_AMBULATORY_CARE_PROVIDER_SITE_OTHER): Payer: Medicare Other | Admitting: Internal Medicine

## 2014-08-25 VITALS — BP 160/110 | HR 107 | Temp 98.9°F | Resp 20 | Ht 67.0 in | Wt 314.0 lb

## 2014-08-25 DIAGNOSIS — R109 Unspecified abdominal pain: Secondary | ICD-10-CM

## 2014-08-25 DIAGNOSIS — F4310 Post-traumatic stress disorder, unspecified: Secondary | ICD-10-CM

## 2014-08-25 DIAGNOSIS — F1910 Other psychoactive substance abuse, uncomplicated: Secondary | ICD-10-CM

## 2014-08-25 DIAGNOSIS — E039 Hypothyroidism, unspecified: Secondary | ICD-10-CM

## 2014-08-25 DIAGNOSIS — M549 Dorsalgia, unspecified: Secondary | ICD-10-CM

## 2014-08-25 DIAGNOSIS — G8929 Other chronic pain: Secondary | ICD-10-CM

## 2014-08-25 DIAGNOSIS — R1013 Epigastric pain: Secondary | ICD-10-CM

## 2014-08-25 MED ORDER — HYDROXYZINE HCL 50 MG PO TABS: 50.0000 mg | ORAL_TABLET | Freq: Four times a day (QID) | ORAL | Status: DC | PRN

## 2014-08-25 MED ORDER — TRAMADOL HCL 50 MG PO TABS: 100.0000 mg | ORAL_TABLET | Freq: Four times a day (QID) | ORAL | Status: DC | PRN

## 2014-08-25 MED ORDER — LEVOTHYROXINE SODIUM 75 MCG PO TABS: 75.0000 ug | ORAL_TABLET | Freq: Every day | ORAL | Status: DC

## 2014-08-25 NOTE — Patient Instructions (Signed)
You have another refill for the 600 mg Neurontin at [facility] Synthroid has been refilled at 75 g which you were changed to the end of December after your TSH value was slightly high Vistaril may be used for nausea and 50 mg every 6 hours Tramadol has been refilled-use it very sparingly Referral to GI has been started If you're getting worse you may return to the clinic on Monday or Tuesday for further evaluation Go ahead and keep your follow-up psychiatry and psychology visits. It is imperative that you begin to get your medications from them as I think they will not want you on the medicines you had been taking in the past

## 2014-08-25 NOTE — Progress Notes (Deleted)
   Subjective:    Patient ID: [name], female    DOB: 09-Dec-1975, 39 y.o.   MRN: [id number]  HPI since her last visit in July 2015 she has had 15 visits to the emergency room and has been admitted 4 times Her last admission was to behavioral health for alcohol abuse. She was requesting detox. They discharged her home with outpatient follow-up for psychiatry counseling and substance abuse. In November 2015 she was sent directly to [facility] for inpatient care and we have no records from that visit. She was admitted 05/03/2014 2, Behavioral Health once again for alcohol abuse and substance induced mood disorder She was discharged to go to the [place] for further treatment. Admission 04/03/2014 to [facility] also resulted in brief stay with referral for outpatient services.  Her most recent hospital emergency room visit was 08/23/2014 for generalized abdominal pain Metabolic profile was normal, CBC normal, urine normal, lipase normal (was also normal on 08/16/2014, 07/30/2014, 07/18/2014), urine pregnancy test negative. On 08/20/2014 the drug test was positive  for barbiturates and opiates. On 06/29/2014 she was positive for benzodiazepines with an alcohol level of 358.  Review of Systems     Objective:   Physical Exam        Assessment & Plan:
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
## 2014-08-30 ENCOUNTER — Telehealth: Payer: Self-pay

## 2014-08-30 MED ORDER — PAROXETINE HCL 40 MG PO TABS: 40.0000 mg | ORAL_TABLET | Freq: Every day | ORAL | Status: DC

## 2014-08-30 NOTE — Telephone Encounter (Signed)
Done . Pt notified.  

## 2014-08-30 NOTE — Telephone Encounter (Signed)
Patient states the psychiatrist does not take her insurance.  She needs Dr.  Laurence Spates to refill the PARoxetine (PAXIL) 40 MG tablet while she is looking for a new psychiastrist.   Does not have money to come in for an OV this month.   [facility] [address].    [phone]

## 2014-08-30 NOTE — Telephone Encounter (Signed)
Ok to call in 30d supply paxil 40

## 2014-09-07 ENCOUNTER — Telehealth: Payer: Self-pay

## 2014-09-07 NOTE — Telephone Encounter (Signed)
Pt states pyschiatrist that she was referred to does not accept her ins.  Pt also requesting a refill on remeron 30mg  until she can find a psychiatrist. States she will not have money to go to psych until after 3/3 and that she is having trouble finding one that takes her ins in [place].  Please advise. CB # [phone]

## 2014-09-07 NOTE — Telephone Encounter (Signed)
Pt called and wants a 5 [place] supply. States that dr Everlene Farrier is able to do this in addition to dr Laney Pastor.... Please advise.Marland KitchenMarland KitchenMarland Kitchen

## 2014-09-07 NOTE — Telephone Encounter (Signed)
Okay to send in #7 Remeron 30 mg to take as instructed.

## 2014-09-08 MED ORDER — MIRTAZAPINE 30 MG PO TABS: ORAL_TABLET | ORAL | Status: DC

## 2014-09-08 NOTE — Telephone Encounter (Signed)
Done

## 2014-09-08 NOTE — Telephone Encounter (Signed)
Pt.notified

## 2014-09-15 ENCOUNTER — Telehealth: Payer: Self-pay

## 2014-09-15 MED ORDER — MIRTAZAPINE 30 MG PO TABS: ORAL_TABLET | ORAL | Status: DC

## 2014-09-15 MED ORDER — PAROXETINE HCL 40 MG PO TABS: 40.0000 mg | ORAL_TABLET | Freq: Every day | ORAL | Status: DC

## 2014-09-15 NOTE — Telephone Encounter (Signed)
Patient does not get paid until March 3, she will not be able to see a psychiatrist so she needs Dr Laney Pastor to refill her Paxil and Remeron.

## 2014-09-15 NOTE — Telephone Encounter (Signed)
Sent in

## 2014-09-15 NOTE — Telephone Encounter (Signed)
Dr. Laney Pastor, please advise.

## 2014-09-17 NOTE — Telephone Encounter (Signed)
Pt called back and would like Dr. To know she took prednisone pack, states she has no where lese to go until she gets with the surgeon.

## 2014-09-17 NOTE — Telephone Encounter (Signed)
Patient has an appointment with Dr. Patrice Paradise - ruptured disc.   Has no money - wants tramadol  - [facility] [address]   [phone]

## 2014-09-18 NOTE — Telephone Encounter (Signed)
Ok to call in 1 refill of tramadol as listed # 20

## 2014-09-18 NOTE — Telephone Encounter (Signed)
Can we refill the Tramadol also?

## 2014-09-19 ENCOUNTER — Other Ambulatory Visit: Payer: Self-pay

## 2014-09-19 NOTE — Telephone Encounter (Signed)
Called in Tramadol to [facility].

## 2014-09-28 ENCOUNTER — Other Ambulatory Visit: Payer: Self-pay | Admitting: Orthopaedic Surgery

## 2014-09-28 DIAGNOSIS — M545 Low back pain: Secondary | ICD-10-CM

## 2014-10-18 ENCOUNTER — Inpatient Hospital Stay: Admission: RE | Admit: 2014-10-18 | Payer: Self-pay | Source: Ambulatory Visit

## 2015-01-06 IMAGING — CR DG ANKLE COMPLETE 3+V*L*
3 series · 3 of 3 positions shown · non-contrast
Comparison: 06/12/2012

CLINICAL DATA: Left ankle pain

EXAM:
LEFT ANKLE COMPLETE - 3+ VIEW

[x ankle ap left]
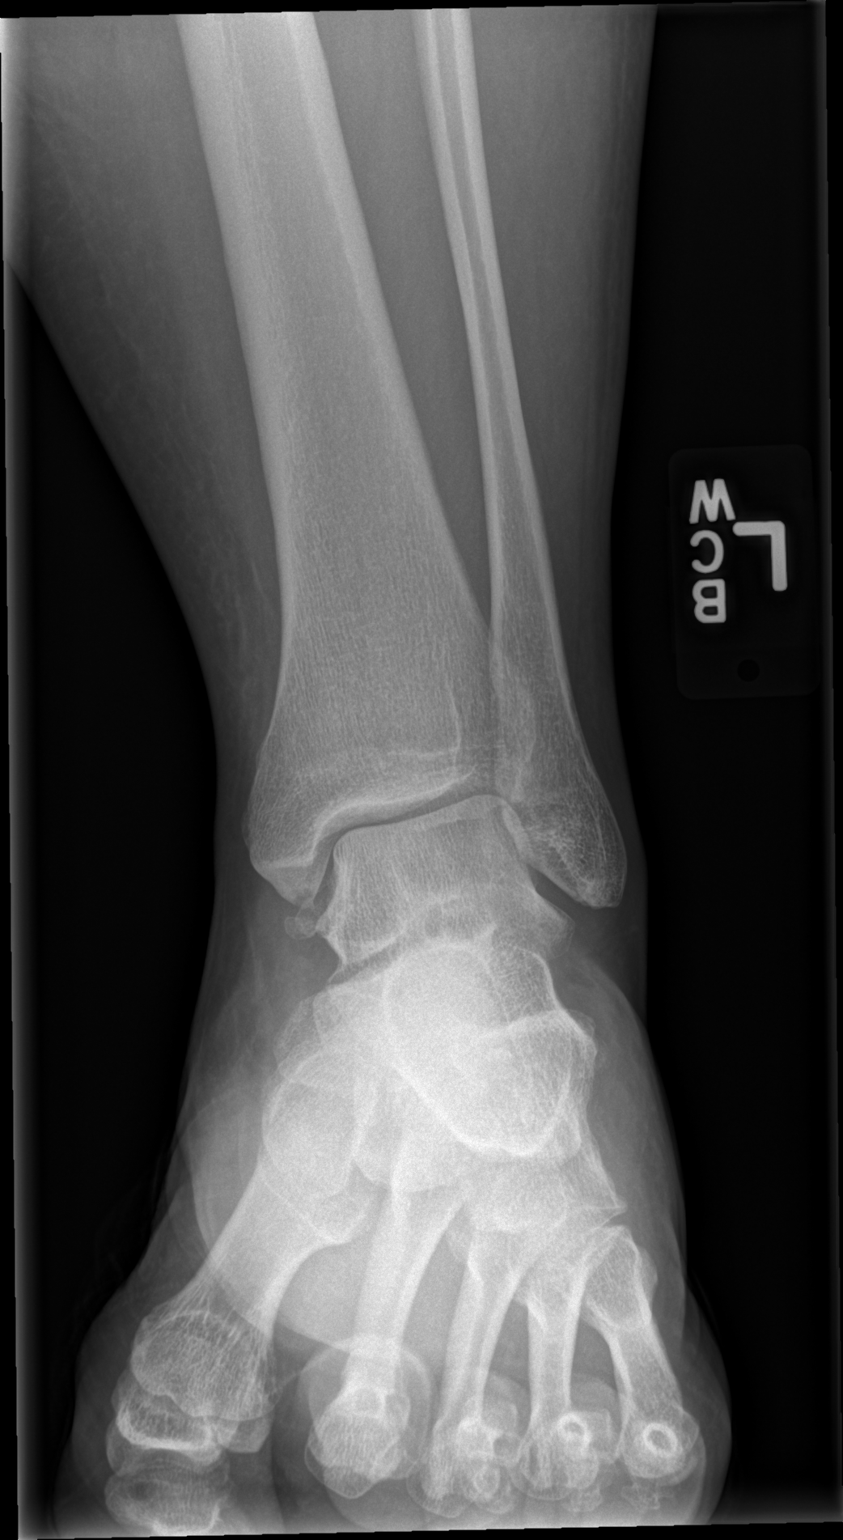

[x ankle obl left]
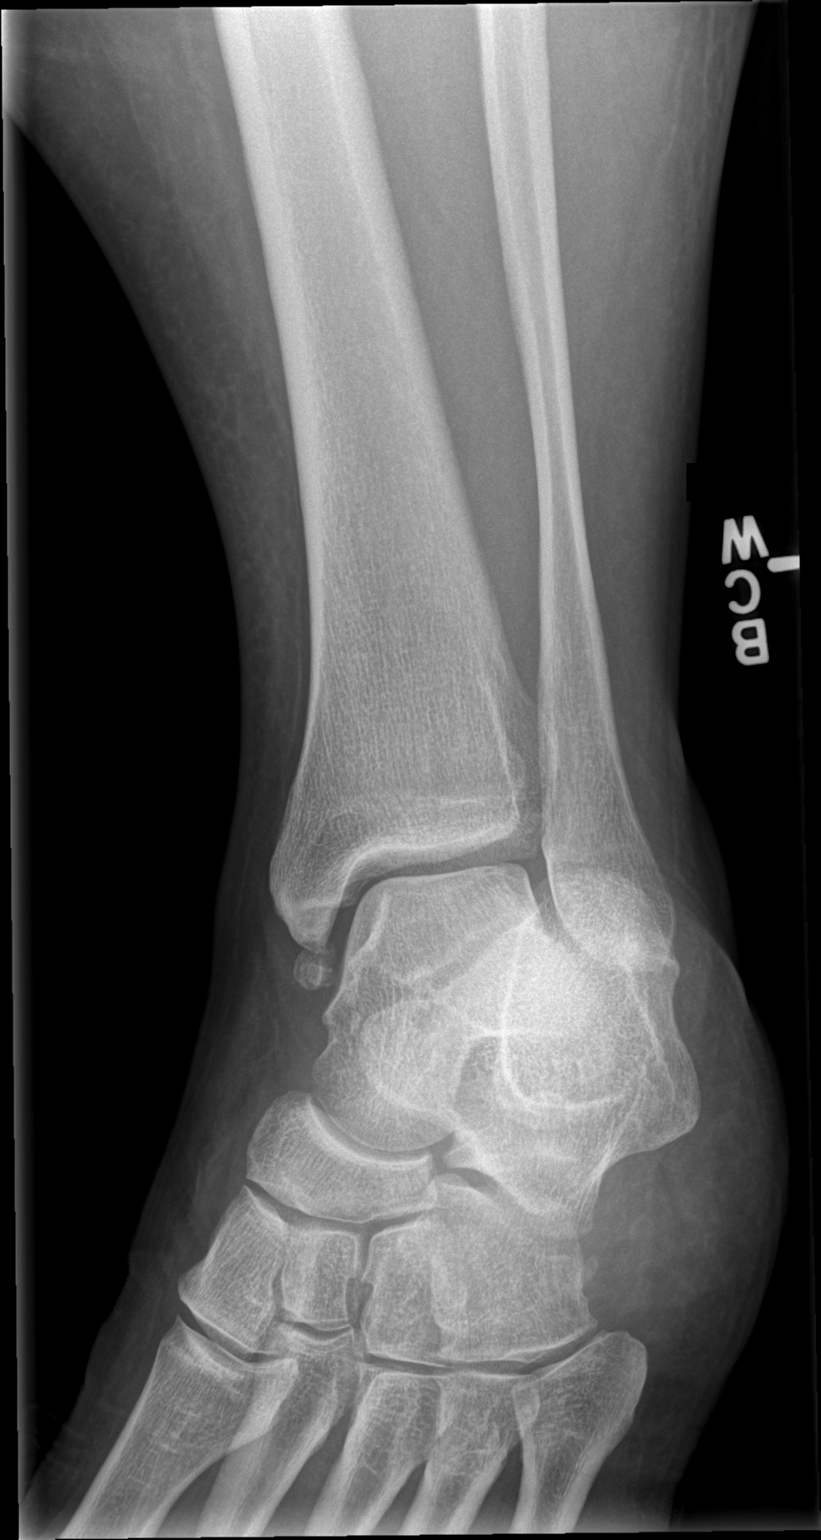

[x ankle lat left]
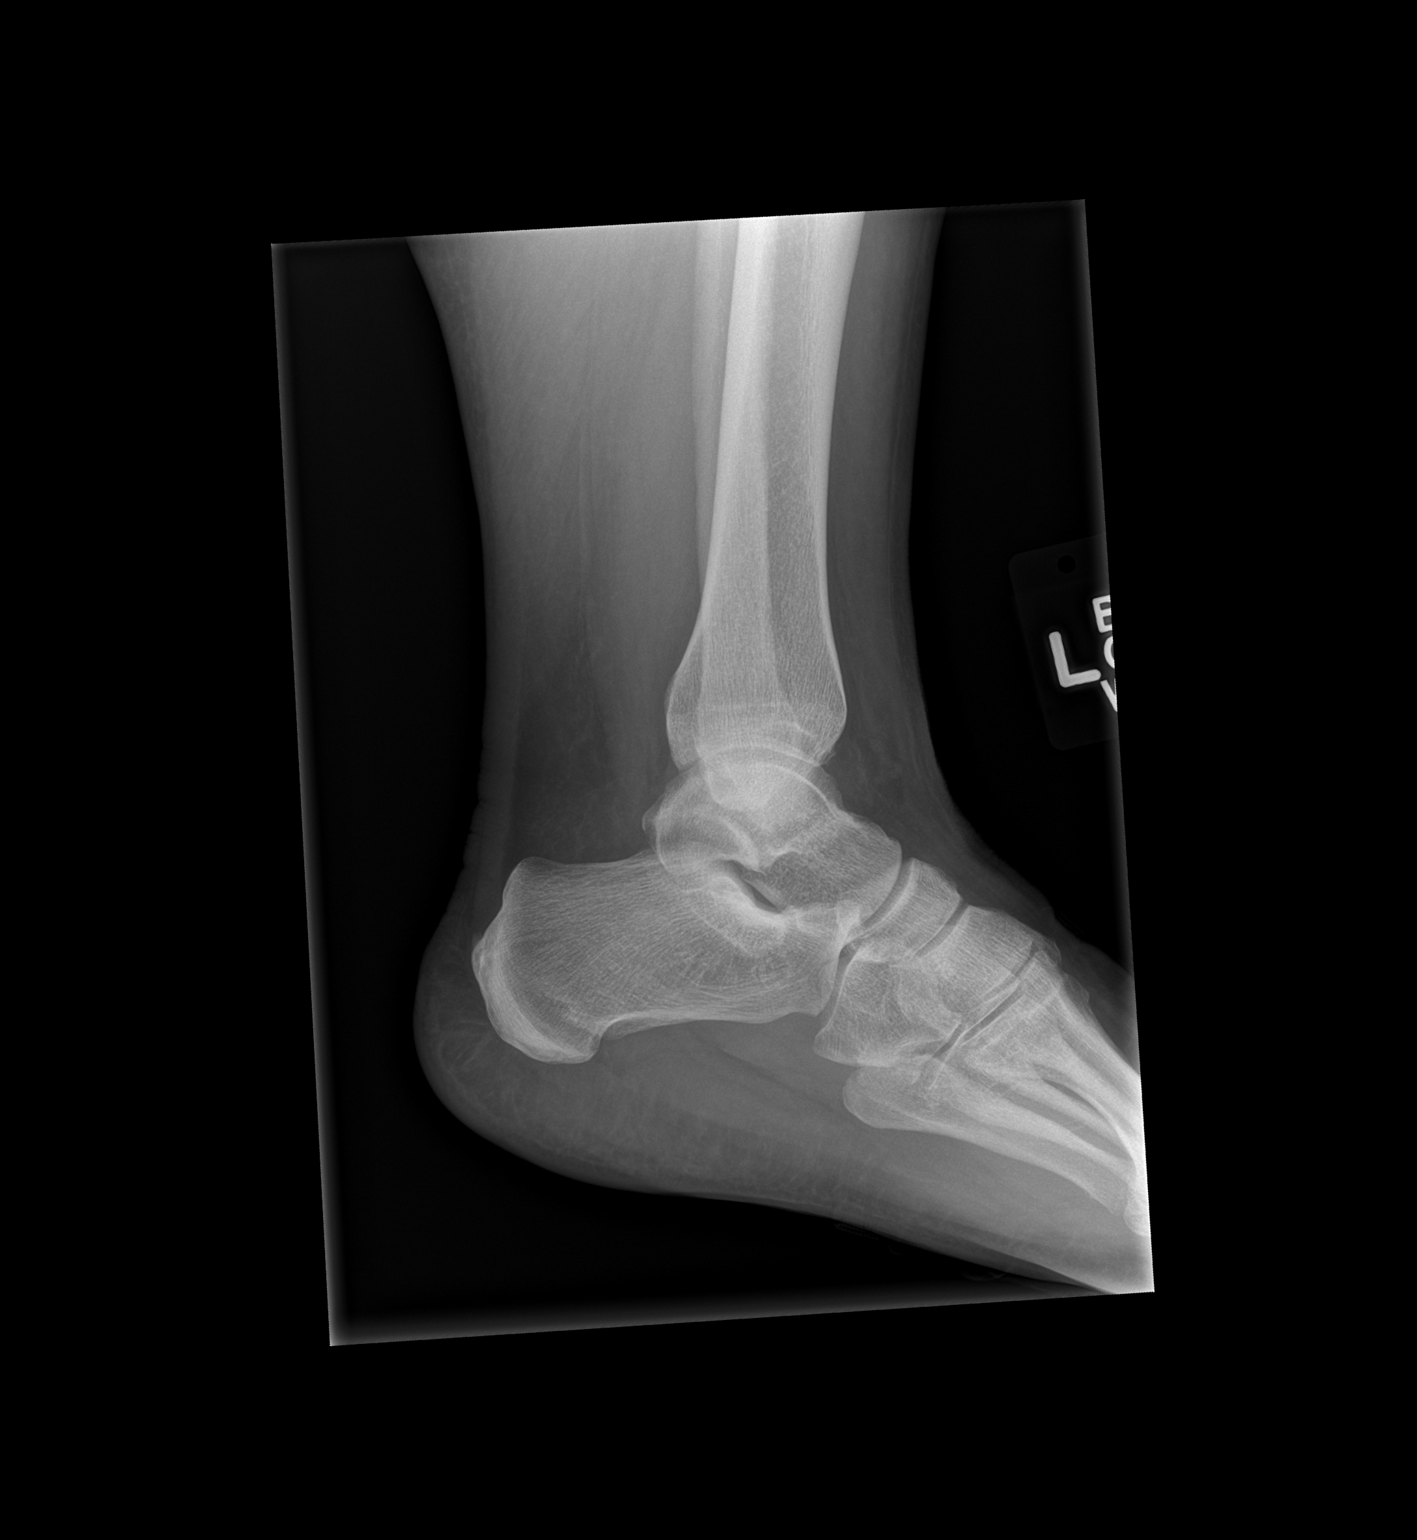

[3 of 3 positions shown; findings below may reference images not displayed]

FINDINGS: Multiple well corticated bony densities are noted adjacent to the
medial malleolus likely related to prior trauma. These are similar
to that seen on prior exams. No new acute fracture is seen. No gross
soft tissue abnormality is noted.
IMPRESSION: Changes of prior trauma.  No acute abnormality is seen.

## 2015-03-24 ENCOUNTER — Telehealth: Payer: Self-pay

## 2015-03-24 NOTE — Telephone Encounter (Signed)
Pt is requesting a refill of synthroid 76mcg 90 [place] supply and zanaflex 6mg . [facility] [address]

## 2015-04-21 ENCOUNTER — Emergency Department (HOSPITAL_COMMUNITY)
Admission: EM | Admit: 2015-04-21 | Discharge: 2015-04-22 | Disposition: A | Discharge status: Home / Self Care | Payer: Commercial Managed Care - HMO | Attending: Emergency Medicine | Admitting: Emergency Medicine

## 2015-04-21 ENCOUNTER — Emergency Department (HOSPITAL_COMMUNITY): Payer: Commercial Managed Care - HMO

## 2015-04-21 ENCOUNTER — Encounter (HOSPITAL_COMMUNITY): Payer: Self-pay | Admitting: Emergency Medicine

## 2015-04-21 ENCOUNTER — Ambulatory Visit (HOSPITAL_COMMUNITY)
Admission: RE | Admit: 2015-04-21 | Discharge: 2015-04-21 | Disposition: A | Discharge status: Home / Self Care | Payer: Commercial Managed Care - HMO | Source: Home / Self Care | Attending: Psychiatry | Admitting: Psychiatry

## 2015-04-21 DIAGNOSIS — Z9981 Dependence on supplemental oxygen: Secondary | ICD-10-CM | POA: Insufficient documentation

## 2015-04-21 DIAGNOSIS — M1990 Unspecified osteoarthritis, unspecified site: Secondary | ICD-10-CM | POA: Insufficient documentation

## 2015-04-21 DIAGNOSIS — K219 Gastro-esophageal reflux disease without esophagitis: Secondary | ICD-10-CM | POA: Insufficient documentation

## 2015-04-21 DIAGNOSIS — F419 Anxiety disorder, unspecified: Secondary | ICD-10-CM | POA: Diagnosis not present

## 2015-04-21 DIAGNOSIS — Z79899 Other long term (current) drug therapy: Secondary | ICD-10-CM | POA: Diagnosis not present

## 2015-04-21 DIAGNOSIS — F29 Unspecified psychosis not due to a substance or known physiological condition: Secondary | ICD-10-CM

## 2015-04-21 DIAGNOSIS — Z720 Tobacco use: Secondary | ICD-10-CM | POA: Insufficient documentation

## 2015-04-21 DIAGNOSIS — G8929 Other chronic pain: Secondary | ICD-10-CM | POA: Insufficient documentation

## 2015-04-21 DIAGNOSIS — F14122 Cocaine abuse with intoxication with perceptual disturbance: Secondary | ICD-10-CM | POA: Diagnosis present

## 2015-04-21 DIAGNOSIS — Z8544 Personal history of malignant neoplasm of other female genital organs: Secondary | ICD-10-CM | POA: Insufficient documentation

## 2015-04-21 DIAGNOSIS — Z008 Encounter for other general examination: Secondary | ICD-10-CM | POA: Diagnosis present

## 2015-04-21 DIAGNOSIS — G479 Sleep disorder, unspecified: Secondary | ICD-10-CM | POA: Diagnosis not present

## 2015-04-21 DIAGNOSIS — G629 Polyneuropathy, unspecified: Secondary | ICD-10-CM | POA: Insufficient documentation

## 2015-04-21 DIAGNOSIS — F1414 Cocaine abuse with cocaine-induced mood disorder: Secondary | ICD-10-CM | POA: Insufficient documentation

## 2015-04-21 DIAGNOSIS — F1994 Other psychoactive substance use, unspecified with psychoactive substance-induced mood disorder: Secondary | ICD-10-CM | POA: Diagnosis present

## 2015-04-21 DIAGNOSIS — Z880 Allergy status to penicillin: Secondary | ICD-10-CM | POA: Diagnosis not present

## 2015-04-21 DIAGNOSIS — E039 Hypothyroidism, unspecified: Secondary | ICD-10-CM | POA: Insufficient documentation

## 2015-04-21 LAB — COMPREHENSIVE METABOLIC PANEL
ALT: 29 U/L (ref 14–54)
AST: 43 U/L — AB (ref 15–41)
Albumin: 4.5 g/dL (ref 3.5–5.0)
Alkaline Phosphatase: 62 U/L (ref 38–126)
Anion gap: 11 (ref 5–15)
BUN: 16 mg/dL (ref 6–20)
CHLORIDE: 103 mmol/L (ref 101–111)
CO2: 25 mmol/L (ref 22–32)
CREATININE: 1.33 mg/dL — AB (ref 0.44–1.00)
Calcium: 9.8 mg/dL (ref 8.9–10.3)
GFR calc Af Amer: 57 mL/min — ABNORMAL LOW (ref >60)
GFR calc non Af Amer: 49 mL/min — ABNORMAL LOW (ref >60)
GLUCOSE: 99 mg/dL (ref 65–99)
POTASSIUM: 4.2 mmol/L (ref 3.5–5.1)
SODIUM: 139 mmol/L (ref 135–145)
Total Bilirubin: 0.8 mg/dL (ref 0.3–1.2)
Total Protein: 7.8 g/dL (ref 6.5–8.1)

## 2015-04-21 LAB — CBC
HCT: 38.8 % (ref 36.0–46.0)
Hemoglobin: 12.9 g/dL (ref 12.0–15.0)
MCH: 30.2 pg (ref 26.0–34.0)
MCHC: 33.2 g/dL (ref 30.0–36.0)
MCV: 90.9 fL (ref 78.0–100.0)
Platelets: 184 10*3/uL (ref 150–400)
RBC: 4.27 MIL/uL (ref 3.87–5.11)
RDW: 13.8 % (ref 11.5–15.5)
WBC: 9.9 10*3/uL (ref 4.0–10.5)

## 2015-04-21 LAB — URINE MICROSCOPIC-ADD ON

## 2015-04-21 LAB — URINALYSIS, ROUTINE W REFLEX MICROSCOPIC
Glucose, UA: NEGATIVE mg/dL
Ketones, ur: NEGATIVE mg/dL
NITRITE: NEGATIVE
PROTEIN: 30 mg/dL — AB
SPECIFIC GRAVITY, URINE: 1.039 — AB (ref 1.005–1.030)
UROBILINOGEN UA: 1 mg/dL (ref 0.0–1.0)
pH: 6 (ref 5.0–8.0)

## 2015-04-21 LAB — RAPID URINE DRUG SCREEN, HOSP PERFORMED
AMPHETAMINES: NOT DETECTED
BARBITURATES: NOT DETECTED
Benzodiazepines: NOT DETECTED
Cocaine: NOT DETECTED
OPIATES: NOT DETECTED
TETRAHYDROCANNABINOL: POSITIVE — AB

## 2015-04-21 LAB — ETHANOL

## 2015-04-21 LAB — ACETAMINOPHEN LEVEL: Acetaminophen (Tylenol), Serum: <10 ug/mL — ABNORMAL LOW (ref 10–30)

## 2015-04-21 LAB — SALICYLATE LEVEL: Salicylate Lvl: <4 mg/dL (ref 2.8–30.0)

## 2015-04-21 MED ORDER — ZOLPIDEM TARTRATE 5 MG PO TABS
5.0000 mg | ORAL_TABLET | Freq: Every evening | ORAL | Status: DC | PRN
  Administered 2015-04-21: 5 mg via ORAL
  Filled 2015-04-21: qty 1

## 2015-04-21 MED ORDER — ALUM & MAG HYDROXIDE-SIMETH 200-200-20 MG/5ML PO SUSP: 30.0000 mL | ORAL | Status: DC | PRN

## 2015-04-21 MED ORDER — ALBUTEROL SULFATE (2.5 MG/3ML) 0.083% IN NEBU: 5.0000 mg | INHALATION_SOLUTION | Freq: Once | RESPIRATORY_TRACT | Status: DC

## 2015-04-21 MED ORDER — LORAZEPAM 1 MG PO TABS
1.0000 mg | ORAL_TABLET | Freq: Three times a day (TID) | ORAL | Status: DC | PRN
  Administered 2015-04-21: 1 mg via ORAL
  Filled 2015-04-21: qty 1

## 2015-04-21 MED ORDER — HYDROXYZINE HCL 25 MG PO TABS
50.0000 mg | ORAL_TABLET | Freq: Four times a day (QID) | ORAL | Status: DC | PRN
  Administered 2015-04-21 – 2015-04-22 (×2): 50 mg via ORAL
  Filled 2015-04-21 (×2): qty 2

## 2015-04-21 MED ORDER — IBUPROFEN 200 MG PO TABS
600.0000 mg | ORAL_TABLET | Freq: Three times a day (TID) | ORAL | Status: DC | PRN
  Administered 2015-04-22: 600 mg via ORAL
  Filled 2015-04-21: qty 3

## 2015-04-21 MED ORDER — OLANZAPINE 10 MG IM SOLR
10.0000 mg | Freq: Once | INTRAMUSCULAR | Status: DC | PRN
  Administered 2015-04-21: 10 mg via INTRAMUSCULAR
  Filled 2015-04-21: qty 10

## 2015-04-21 MED ORDER — PANTOPRAZOLE SODIUM 20 MG PO TBEC
20.0000 mg | DELAYED_RELEASE_TABLET | Freq: Every day | ORAL | Status: DC
  Administered 2015-04-22: 20 mg via ORAL
  Filled 2015-04-21: qty 1

## 2015-04-21 MED ORDER — ONDANSETRON HCL 4 MG PO TABS
4.0000 mg | ORAL_TABLET | Freq: Three times a day (TID) | ORAL | Status: DC | PRN
  Administered 2015-04-22: 4 mg via ORAL
  Filled 2015-04-21: qty 1

## 2015-04-21 MED ORDER — NICOTINE 21 MG/24HR TD PT24
21.0000 mg | MEDICATED_PATCH | Freq: Every day | TRANSDERMAL | Status: DC
  Administered 2015-04-21 – 2015-04-22 (×2): 21 mg via TRANSDERMAL
  Filled 2015-04-21 (×2): qty 1

## 2015-04-21 MED ORDER — LEVOTHYROXINE SODIUM 75 MCG PO TABS
75.0000 ug | ORAL_TABLET | Freq: Every day | ORAL | Status: DC
  Administered 2015-04-22: 75 ug via ORAL
  Filled 2015-04-21 (×2): qty 1

## 2015-04-21 MED ORDER — IBUPROFEN 800 MG PO TABS: 800.0000 mg | ORAL_TABLET | Freq: Four times a day (QID) | ORAL | Status: DC | PRN

## 2015-04-21 MED ORDER — DIPHENHYDRAMINE HCL 50 MG/ML IJ SOLN
50.0000 mg | Freq: Once | INTRAMUSCULAR | Status: DC | PRN
  Administered 2015-04-21: 50 mg via INTRAMUSCULAR
  Filled 2015-04-21: qty 1

## 2015-04-21 MED ORDER — LORAZEPAM 2 MG/ML IJ SOLN
2.0000 mg | Freq: Once | INTRAMUSCULAR | Status: DC | PRN
  Administered 2015-04-21: 2 mg via INTRAMUSCULAR
  Filled 2015-04-21: qty 1

## 2015-04-21 NOTE — ED Notes (Signed)
Pt arrived to room 41.  She has rapid, disorganized speech.  She is acting very bizarre and hard to redirect.  She is going in all of the rooms and also taking her pants off.  She is obviously responding to internal stimuli.

## 2015-04-21 NOTE — ED Provider Notes (Signed)
CSN: [id number]     Arrival date & time 04/21/15  1300 History  This chart was scribed for non-physician practitioner Lucien Mons, PA-C working with Lacretia Leigh, MD by Meriel Pica, ED Scribe. This patient was seen in room WTR4/WLPT4 and the patient's care was started at 1:32 PM.   Chief Complaint  Patient presents with  . Manic Behavior    LEVEL 5 CAVEAT: SECONDARY TO PSYCHOSIS   The history is provided by the patient. The history is limited by the condition of the patient. No language interpreter was used.   HPI Comments: [name] is a 39 y.o. female, with a significant PMhx, who presents to the Emergency Department from [facility] for medical clearance. Pt has been seen by TTS and recommended inpatient treatment; currently does not have a bed at [facility]. Pt unable to give any information to history and has rapid, tangential speech making no sense.   Past Medical History  Diagnosis Date  . Thyroid disease   . Chronic back pain   . Hypothyroidism   . Anxiety   . GERD (gastroesophageal reflux disease)   . Sleep apnea     cpap  . Headache(784.0)   . Neuromuscular disorder     neuropathy  . Arthritis     knees bilateral   DJD  stenosis   . Blood dyscrasia     HSV  . PCOS (polycystic ovarian syndrome)   . Cancer     vaginal  malignant carcinoma  . Alcohol abuse   . Drug-seeking behavior   . Polysubstance abuse    Past Surgical History  Procedure Laterality Date  . Back surgery    . Cesarean section    . Tonsillectomy    . Tubal ligation    . Fracture surgery     Family History  Problem Relation Age of Onset  . Arthritis Mother   . Clotting disorder Mother   . Hyperlipidemia Father   . Leukemia Maternal Grandmother   . Diabetes Maternal Grandfather   . Stroke Paternal Grandmother   . Heart disease Father   . Diabetes Mother   . Hypertension Mother   . Prostate cancer Father   . Heart disease Paternal Grandmother    Social History  Substance Use Topics  . Smoking  status: Current Every [place] Smoker -- 1.00 packs/[place] for 20 years    Types: Cigarettes  . Smokeless tobacco: Never Used  . Alcohol Use: No     Comment: denies alcohol since 12/16   OB History    Gravida Para Term Preterm AB TAB SAB Ectopic Multiple Living   1 1 1             Review of Systems  Unable to perform ROS: Psychiatric disorder  Psychiatric/Behavioral: Positive for decreased concentration. The patient is nervous/anxious.    Allergies  Moxifloxacin; Penicillins; and Quinolones  Home Medications   Prior to Admission medications   Medication Sig Start Date End Date Taking? Authorizing Willamae Demby  buprenorphine (SUBUTEX) 8 MG SUBL SL tablet Place under the tongue daily.   Yes Historical Jaslynne Dahan, MD  DULoxetine (CYMBALTA) 60 MG capsule Take 60 mg by mouth daily.   Yes Historical Malkie Wille, MD  gabapentin (NEURONTIN) 600 MG tablet Take 1,200 mg by mouth 4 (four) times daily.   Yes Historical Pamula Luther, MD  levothyroxine (SYNTHROID, LEVOTHROID) 75 MCG tablet Take 1 tablet (75 mcg total) by mouth daily before breakfast. 08/25/14  Yes Leandrew Koyanagi, MD  mirtazapine (REMERON) 30 MG tablet Take  as directed 09/15/14  Yes Leandrew Koyanagi, MD  tiZANidine (ZANAFLEX) 2 MG tablet Take 2 mg by mouth 3 (three) times daily.   Yes Historical [place] Inthavong, MD  zolpidem (AMBIEN) 10 MG tablet Take 10 mg by mouth at bedtime as needed for sleep.   Yes Historical Oral Hallgren, MD  gabapentin (NEURONTIN) 400 MG capsule Take 800 mg by mouth 4 (four) times daily.    Historical Kamori Kitchens, MD  HYDROcodone-acetaminophen (NORCO/VICODIN) 5-325 MG per tablet Take 1-2 tablets by mouth every 6 (six) hours as needed for moderate pain or severe pain. Patient not taking: Reported on 08/23/2014 07/31/14   Tatyana Kirichenko, PA-C  HYDROmorphone (DILAUDID) 2 MG tablet Take 1 tablet (2 mg total) by mouth every 12 (twelve) hours as needed for severe pain. Patient not taking: Reported on 08/16/2014 07/18/14   Flonnie Overman Dhungel, MD   hydrOXYzine (ATARAX/VISTARIL) 50 MG tablet Take 1 tablet (50 mg total) by mouth every 6 (six) hours as needed for nausea or vomiting. 08/25/14   Leandrew Koyanagi, MD  ibuprofen (ADVIL,MOTRIN) 200 MG tablet Take 800 mg by mouth every 6 (six) hours as needed.    Historical Boston Catarino, MD  levothyroxine (SYNTHROID, LEVOTHROID) 50 MCG tablet TAKE ONE TABLET BY MOUTH ONCE DAILY BEFORE  BREAKFAST Patient not taking: Reported on 04/21/2015 06/27/14   Benjamine Mola, FNP  methocarbamol (ROBAXIN) 500 MG tablet Take 1 tablet (500 mg total) by mouth every 8 (eight) hours as needed for muscle spasms. Patient not taking: Reported on 08/20/2014 06/27/14   Benjamine Mola, FNP  oxyCODONE-acetaminophen (ROXICET) 5-325 MG per tablet Take 2 tablets by mouth every 6 (six) hours as needed for severe pain. Patient not taking: Reported on 08/23/2014 07/18/14   Nishant Dhungel, MD  PARoxetine (PAXIL) 40 MG tablet Take 1 tablet (40 mg total) by mouth daily. 09/15/14   Leandrew Koyanagi, MD  promethazine (PHENERGAN) 25 MG tablet Take 1 tablet (25 mg total) by mouth every 6 (six) hours as needed for nausea or vomiting. Patient not taking: Reported on 08/20/2014 07/31/14   Tatyana Kirichenko, PA-C  sucralfate (CARAFATE) 1 G tablet Take 1 tablet (1 g total) by mouth 4 (four) times daily -  with meals and at bedtime. Patient not taking: Reported on 08/25/2014 08/23/14   Starlyn Skeans, PA-C  tiZANidine (ZANAFLEX) 4 MG capsule TK ONE C PO TID 03/22/15   Historical Astor Gentle, MD  traMADol (ULTRAM) 50 MG tablet Take 2 tablets (100 mg total) by mouth every 6 (six) hours as needed. 08/25/14   Leandrew Koyanagi, MD   BP 140/92 mmHg  Pulse 101  Temp(Src) 98.8 F (37.1 C) (Oral)  Resp 20  SpO2 99% Physical Exam  Constitutional: She is oriented to person, place, and time. She appears well-developed and well-nourished. No distress.  Obese.  HENT:  Head: Normocephalic and atraumatic.  Mouth/Throat: Oropharynx is clear and moist.  Eyes:  Conjunctivae and EOM are normal.  Neck: Normal range of motion. Neck supple.  Cardiovascular: Normal rate, regular rhythm and normal heart sounds.   Pulmonary/Chest: Effort normal. No respiratory distress.  During forceful expiration, has faint rhonchi that clear with cough. Patient will not do normal inspiration and expiration, she will take a deep breath in and then forcefully breathed out in attempt to make herself cough. When distracted, does not forcefully breath and lungs clear.  Musculoskeletal: Normal range of motion. She exhibits no edema.  Neurological: She is alert and oriented to person, place, and time. No sensory deficit.  Skin: Skin is warm and dry.  Psychiatric: Her speech is rapid and/or pressured and tangential.  Jumbled speech. No clarity.  Nursing note and vitals reviewed.   ED Course  Procedures  COORDINATION OF CARE: 1:39 PM Discussed treatment plan with pt at bedside and pt agreed to plan.   Labs Review Labs Reviewed  COMPREHENSIVE METABOLIC PANEL - Abnormal; Notable for the following:    Creatinine, Ser 1.33 (*)    AST 43 (*)    GFR calc non Af Amer 49 (*)    GFR calc Af Amer 57 (*)    All other components within normal limits  URINE RAPID DRUG SCREEN, HOSP PERFORMED - Abnormal; Notable for the following:    Tetrahydrocannabinol POSITIVE (*)    All other components within normal limits  ACETAMINOPHEN LEVEL - Abnormal; Notable for the following:    Acetaminophen (Tylenol), Serum <10 (*)    All other components within normal limits  URINALYSIS, ROUTINE W REFLEX MICROSCOPIC (NOT AT [facility]) - Abnormal; Notable for the following:    Color, Urine AMBER (*)    APPearance CLOUDY (*)    Specific Gravity, Urine 1.039 (*)    Hgb urine dipstick LARGE (*)    Bilirubin Urine SMALL (*)    Protein, ur 30 (*)    Leukocytes, UA SMALL (*)    All other components within normal limits  URINE MICROSCOPIC-ADD ON - Abnormal; Notable for the following:    Squamous Epithelial /  LPF MANY (*)    Bacteria, UA FEW (*)    All other components within normal limits  CBC  ETHANOL  SALICYLATE LEVEL    Imaging Review Dg Chest 2 View  04/21/2015   CLINICAL DATA:  Cough  EXAM: CHEST  2 VIEW  COMPARISON:  06/07/2014  FINDINGS: Medial right basilar opacity is stable since prior study and felt represent prominent epicardial fat pad. No confluent airspace opacities or effusions. Heart is normal size. No bony abnormality.  IMPRESSION: No active cardiopulmonary disease.   Electronically Signed   By: Rolm Baptise M.D.   On: 04/21/2015 13:50   I have personally reviewed and evaluated these images and lab results as part of my medical decision-making.   MDM   Final diagnoses:  Psychosis, unspecified psychosis type   Medically cleared. UA contaminated. Unlikely infection. Awaiting placement.  I personally performed the services described in this documentation, which was scribed in my presence. The recorded information has been reviewed and is accurate.  Carman Ching, PA-C 04/22/15 0845  Lacretia Leigh, MD 04/23/15 [phone]

## 2015-04-21 NOTE — ED Notes (Signed)
Pt will not be still enough to get BP.

## 2015-04-21 NOTE — ED Notes (Signed)
Pt continues to act very bizarre.  She c/o seeing animals on floor and continues to respond to internal stimuli.  She was given a prn ativan for c/o agitation.  She is pacing a lot.  Video monitoring and 15 minute checks in place.

## 2015-04-21 NOTE — ED Notes (Signed)
Bed: WLPT4 Expected date:  Expected time:  Means of arrival:  Comments: [facility] med clearance

## 2015-04-21 NOTE — ED Notes (Addendum)
Per Agricultural consultant. Pt sent over here by [facility] for medical clearance labwork. During assessment, pt has flight of ideas and cannot hold a long conversation. Pt denies SI/HI. Pt has various complaints: reports URI a week ago, "liver pain" 2 days ago then points to R side, then reports lower back pain and then buttock pain.

## 2015-04-21 NOTE — BH Assessment (Addendum)
Assessment Note  [name] is an 39 y.o. female that presents to [facility] as a walk-in brought to [facility] by EMS.  Per Letitia Libra, [facility], EMS stated that pt called police to get people out of her home.  Per police, there were no people there.  Police searched for pt's child, and it appears child is with "Nana."  Attempted to call [phone] and [phone], numbers given as pt's emergency contact, and no answer, left message.  Upon assessment, pt was not oriented, appeared confused, was talking to herself, appeared to be responding to internal stimuli, has slurred speech, appeared disorganized.  Pt attempted to urinate in the assessment room, as she thought it was a bathroom.  Pt had body odor, was disheveled.  Pt stated "pizza delivery" was what brought her to [facility].  Pt unable to answer assessment questions at this time and appears psychotic.  Pt has tics and has visible tremor in her hands.  Pt has a hx of alcohol and narcotic abuse per her chart.  Pt also has hx of anxiety per chart.  Consulted with Elmarie Shiley, NP who recommended pt be sent to [facility] for medical clearance and further evaluation.  Inpatient recommended.  Called [place] and pt transported to [facility] with a mental health tech.  Called and updated Patty, Therapist, sports, Camera operator at [employer] of pt disposition as well as Letitia Libra, [facility], at [facility].  Updated ED and TTS.   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 Thyroid disease   . Chronic back pain   . Hypothyroidism   . Anxiety   . GERD (gastroesophageal reflux disease)   . Sleep apnea     cpap  . Headache(784.0)   . Neuromuscular disorder     neuropathy  . Arthritis     knees bilateral   DJD  stenosis   . Blood dyscrasia     HSV  . PCOS (polycystic ovarian syndrome)   . Cancer     vaginal  malignant carcinoma  .  Alcohol abuse   . Drug-seeking behavior   . Polysubstance abuse    Axis IV: other psychosocial or environmental problems and problems with primary support group Axis V: 21-30 behavior considerably influenced by delusions or hallucinations OR serious impairment in judgment, communication OR inability to function in almost all areas  Past Medical History:  Past Medical History  Diagnosis Date  . Thyroid disease   . Chronic back pain   . Hypothyroidism   . Anxiety   . GERD (gastroesophageal reflux disease)   . Sleep apnea     cpap  . Headache(784.0)   . Neuromuscular disorder     neuropathy  . Arthritis     knees bilateral   DJD  stenosis   . Blood dyscrasia     HSV  . PCOS (polycystic ovarian syndrome)   . Cancer     vaginal  malignant carcinoma  . Alcohol abuse   . Drug-seeking behavior   . Polysubstance abuse     Past Surgical History  Procedure Laterality Date  . Back surgery    . Cesarean section    . Tonsillectomy    . Tubal ligation    . Fracture surgery      Family History:  Family History  Problem Relation Age of Onset  . Arthritis Mother   .  Clotting disorder Mother   . Hyperlipidemia Father   . Leukemia Maternal Grandmother   . Diabetes Maternal Grandfather   . Stroke Paternal Grandmother   . Heart disease Father   . Diabetes Mother   . Hypertension Mother   . Prostate cancer Father   . Heart disease Paternal Grandmother     Social History:  reports that she has been smoking Cigarettes.  She has a 20 pack-year smoking history. She has never used smokeless tobacco. She reports that she uses illicit drugs. She reports that she does not drink alcohol.  Additional Social History:  Alcohol / Drug Use Pain Medications: see med list Prescriptions: see med list Over the Counter: see med list History of alcohol / drug use?:  (UTA-hx narcotic and alcohol abuse) Longest period of sobriety (when/how long): unk Negative Consequences of Use:   (UTA) Withdrawal Symptoms:  (UTA)  CIWA:   COWS:    Allergies:  Allergies  Allergen Reactions  . Penicillins Hives  . Quinolones Hives    Hives with moxifloxacin and levofloxacin    Home Medications:  No prescriptions prior to admission    OB/GYN Status:  No LMP recorded.  General Assessment Data Location of Assessment: [facility] Assessment Services TTS Assessment: In system Is this a Tele or Face-to-Face Assessment?: Face-to-Face Is this an Initial Assessment or a Re-assessment for this encounter?: Initial Assessment Marital status: Single Maiden name:  (unk) Is patient pregnant?: Unknown Pregnancy Status: Unknown Living Arrangements: Other (Comment) (unknown) Can pt return to current living arrangement?:  (unknown) Admission Status: Voluntary Is patient capable of signing voluntary admission?: Yes Referral Source: Self/Family/Friend Insurance type: Geneticist, molecular Exam ([place]) Medical Exam completed: No Reason for MSE not completed: Other: (pt sent to [facility] for med clearance)  Crisis Care Plan Living Arrangements: Other (Comment) (unknown) Name of Psychiatrist: unk Name of Therapist: unk  Education Status Is patient currently in school?:  (UTA) Current Grade: unk Highest grade of school patient has completed: unk Name of school: unk Contact person: unk  Risk to self with the past 6 months Suicidal Ideation:  (UTA) Has patient been a risk to self within the past 6 months prior to admission? :  (UTA) Suicidal Intent:  (UTA) Has patient had any suicidal intent within the past 6 months prior to admission? :  (UTA) Is patient at risk for suicide?:  (UTA) Suicidal Plan?:  (UTA) Has patient had any suicidal plan within the past 6 months prior to admission? :  (UTA) Access to Means:  (UTA) What has been your use of drugs/alcohol within the last 12 months?: pt has hx of narcotic and alcohol abuse per chart Previous Attempts/Gestures: No (per last  assessment) How many times?: 0 Other Self Harm Risks: UTA Triggers for Past Attempts: Unknown Intentional Self Injurious Behavior:  (UTA) Family Suicide History: Unable to assess Recent stressful life event(s): Other (Comment) (psychosis) Persecutory voices/beliefs?:  (UTA) Depression:  (UTA) Depression Symptoms:  (UTA) Substance abuse history and/or treatment for substance abuse?: Yes Suicide prevention information given to non-admitted patients: Not applicable  Risk to Others within the past 6 months Homicidal Ideation:  (UTA) Does patient have any lifetime risk of violence toward others beyond the six months prior to admission? :  (UTA) Thoughts of Harm to Others:  (UTA) Current Homicidal Intent:  (UTA) Current Homicidal Plan:  (UTA) Access to Homicidal Means:  (UTA) Identified Victim:  (UTA) History of harm to others?:  (UTA) Assessment of Violence: None Noted  Violent Behavior Description: pt cooperative Does patient have access to weapons?:  (UTA) Criminal Charges Pending?:  (UTA) Does patient have a court date:  (UTA) Is patient on probation?:  (UTA)  Psychosis Hallucinations:  (pt appeared to be responding to internal stimuli) Delusions:  (UTA)  Mental Status Report Appearance/Hygiene: Body odor, Disheveled, Bizarre, Poor hygiene Eye Contact: Poor Motor Activity: Restlessness, Tics Speech: Incoherent, Slurred Level of Consciousness: Restless Mood: Preoccupied Affect: Preoccupied Anxiety Level:  (UTA) Thought Processes: Thought Blocking, Irrelevant Judgement: Impaired Orientation: Not oriented Obsessive Compulsive Thoughts/Behaviors: Unable to Assess  Cognitive Functioning Concentration: Unable to Assess Memory: Unable to Assess IQ:  (Unk) Insight: Unable to Assess Impulse Control: Unable to Assess Appetite:  (unk) Weight Loss:  (unk) Weight Gain:  (unk) Sleep: Unable to Assess Total Hours of Sleep:  (unk) Vegetative Symptoms: Unable to  Assess  ADLScreening [facility] Assessment Services) Patient's cognitive ability adequate to safely complete daily activities?: Yes Patient able to express need for assistance with ADLs?: Yes Independently performs ADLs?: Yes (appropriate for developmental age)  Prior Inpatient Therapy Prior Inpatient Therapy: Yes Prior Therapy Dates: 2015 Prior Therapy Facilty/Provider(s): [facility] Reason for Treatment: Detox  Prior Outpatient Therapy Prior Outpatient Therapy: No Prior Therapy Dates: na Prior Therapy Facilty/Provider(s): na Reason for Treatment: na Does patient have an ACCT team?: Unknown Does patient have Intensive In-House Services?  : Unknown Does patient have [facility] services? : Unknown Does patient have P4CC services?: Unknown  ADL Screening (condition at time of admission) Patient's cognitive ability adequate to safely complete daily activities?: Yes Is the patient deaf or have difficulty hearing?: No Does the patient have difficulty seeing, even when wearing glasses/contacts?: No Does the patient have difficulty concentrating, remembering, or making decisions?: Yes Patient able to express need for assistance with ADLs?: Yes Does the patient have difficulty dressing or bathing?: No Independently performs ADLs?: Yes (appropriate for developmental age) Does the patient have difficulty walking or climbing stairs?: No  Home Assistive Devices/Equipment Home Assistive Devices/Equipment: None    Abuse/Neglect Assessment (Assessment to be complete while patient is alone) Physical Abuse:  (UTA) Verbal Abuse:  (UTA) Sexual Abuse:  (UTA) Exploitation of patient/patient's resources:  (UTA) Self-Neglect:  (UTA) Values / Beliefs Cultural Requests During Hospitalization: None Spiritual Requests During Hospitalization: None Consults Spiritual Care Consult Needed: No Social Work Consult Needed: No Regulatory affairs officer (For Healthcare) Does patient have an advance directive?:   (UTA)    Additional Information 1:1 In Past 12 Months?: No CIRT Risk: No Elopement Risk: No Does patient have medical clearance?: No     Disposition:  Disposition Initial Assessment Completed for this Encounter: Yes Disposition of Patient: Referred to, Inpatient treatment program Type of inpatient treatment program: Adult Patient referred to: Other (Comment) (pt sent to [facility] for medical clearance)  On Site Evaluation by:   Reviewed with Physician:    Shaune Pascal, MS, [facility] Therapeutic Triage Specialist [facility]   04/21/2015 12:55 PM

## 2015-04-21 NOTE — BH Assessment (Signed)
[place] Assessment Progress Note  Received call from pt's emergency contact listed, pt's mother-in-law, Baker Janus [phone], at [phone].  She stated pt has hx of using drugs.  Pt's child is with her son and is safe.  Pt gave parent's and brother's phone numbers if needed- parents-William and Marlane Hatcher - ([place]) [phone] and (C) [phone]. Pt's brother, Will [phone]. Pt's mother-in-law has requested to be informed of pt's disposition.  Shaune Pascal, MS, [facility] Therapeutic Triage Specialist [facility]

## 2015-04-21 NOTE — BH Assessment (Signed)
Inpt recommended, 500 hall, no current [facility] beds available. Seeking placement and sent referrals to: Mikel Cella, [place], [place], [place], [place] Triage Specialist 04/21/2015 8:04 PM

## 2015-04-21 NOTE — Progress Notes (Signed)
D Pt. Is extremely confused as to place, time, and situation.  Pt. Does know her name but is unable at this to state her Mothers name or any of her family members.  Pt. Has attempted to take her clothes off and walk out into the hall multiple times.  Pt. Is talking to the walls, cabinets, doors and the floor totally unaware of her surroundings.  Pt. Has also tried multiple times to enter other patients rooms.    A Writer with the assistance of other staff members are continually redirecting the pt.  Pt. Received ativan earlier and has now received 50 of benadryl IM and 10 of zyprexa IM.  R Pt. Continues to be confused.  Will continue to redirect and monitor to ensure pt. Safety.

## 2015-04-22 DIAGNOSIS — F14122 Cocaine abuse with intoxication with perceptual disturbance: Secondary | ICD-10-CM

## 2015-04-22 DIAGNOSIS — F1994 Other psychoactive substance use, unspecified with psychoactive substance-induced mood disorder: Secondary | ICD-10-CM | POA: Diagnosis not present

## 2015-04-22 MED ORDER — DULOXETINE HCL 60 MG PO CPEP
60.0000 mg | ORAL_CAPSULE | Freq: Every day | ORAL | Status: DC
  Administered 2015-04-22: 60 mg via ORAL
  Filled 2015-04-22 (×2): qty 1

## 2015-04-22 MED ORDER — MIRTAZAPINE 30 MG PO TABS: 30.0000 mg | ORAL_TABLET | Freq: Every day | ORAL | Status: DC

## 2015-04-22 MED ORDER — HYDROXYZINE HCL 25 MG PO TABS: 50.0000 mg | ORAL_TABLET | Freq: Four times a day (QID) | ORAL | Status: DC | PRN

## 2015-04-22 MED ORDER — LEVOTHYROXINE SODIUM 50 MCG PO TABS: 50.0000 ug | ORAL_TABLET | Freq: Every day | ORAL | Status: DC

## 2015-04-22 MED ORDER — GABAPENTIN 400 MG PO CAPS
800.0000 mg | ORAL_CAPSULE | Freq: Three times a day (TID) | ORAL | Status: DC
  Administered 2015-04-22: 800 mg via ORAL
  Filled 2015-04-22: qty 2

## 2015-04-22 MED ORDER — TRAZODONE HCL 100 MG PO TABS: 100.0000 mg | ORAL_TABLET | Freq: Every evening | ORAL | Status: DC | PRN

## 2015-04-22 MED ORDER — PAROXETINE HCL 20 MG PO TABS
40.0000 mg | ORAL_TABLET | Freq: Every day | ORAL | Status: DC
  Administered 2015-04-22: 40 mg via ORAL
  Filled 2015-04-22 (×2): qty 2

## 2015-04-22 NOTE — Progress Notes (Signed)
Pt after a very brief rest is up again confused and attempting to go into other patients rooms.  Pt. Continues to talk to the walls and cabinets and to the animals that she believes are on the floor.  Pt. Even gets down in the floor in the hallway to rub the dog she believes is present. Pt. Attempts to remove a cup from another patients hands. Writer adm. Ativan for her anxiety, and vistaril due to the patient scratching her legs.  Pt. Did get back in the bed.  Pt. Reports she has not slept in 3 days.  Continue to monitor to ensure pt. Safety.  Pt. Remains safe on the unit.

## 2015-04-22 NOTE — BH Assessment (Signed)
[place] Assessment Progress Note   Per Corena Pilgrim, MD, this pt does not require psychiatric hospitalization at this time.  She is to be discharged from [facility] with outpatient substance abuse treatment resources.  She has indicated that she wants to receive Suboxone treatment.  Discharge instructions include [facility] for [facility], [place], and [facility].  Pt's nurse has been notified.  [name], [place] Triage Specialist [phone]

## 2015-04-22 NOTE — Consult Note (Signed)
[facility] Face-to-Face Psychiatry Consult   Reason for Consult:  Substance induced mood disorder with psychosis Referring Physician:  EDP Patient Identification: [name] MRN:  [id number] Principal Diagnosis: Substance induced mood disorder Diagnosis:   Patient Active Problem List   Diagnosis Date Noted  . Substance induced mood disorder [F19.94] 05/03/2014    Priority: High  . Cocaine abuse with intoxication with perceptual disturbance [F14.122] 04/12/2014    Priority: High  . Alcohol dependence with withdrawal with complication [K93.267] 12/45/8099    Priority: High  . ETOH abuse [F10.10] 07/18/2014  . Alcoholic pancreatitis [I33.8] 07/18/2014  . Drug-seeking behavior [F19.10] 07/18/2014  . Acute pancreatitis [K85.9] 07/16/2014  . Alcohol intoxication in active alcoholic without complication [S50.539]   . Depression [F32.9]   . Polysubstance dependence including opioid type drug, episodic abuse [F11.229] 06/26/2014  . Alcohol dependence with uncomplicated withdrawal [J67.341] 04/03/2014  . Herpes labialis [B00.1] 10/08/2013  . Lumbar radiculopathy, chronic [M54.16] 10/08/2013  . Nicotine addiction [F17.200] 07/30/2012  . Fall down stairs [W10.8XXA] 05/27/2012  . Foot fracture [S92.909A] 05/27/2012  . Obesity, Class III, BMI 40-49.9 (morbid obesity) [E66.01] 10/31/2011  . Mood disorder [F39] 10/31/2011  . Hypothyroidism [E03.9] 10/31/2011  . Obstructive sleep apnea [G47.33] 10/31/2011  . 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 Chronic back pain [M54.9, G89.29]     Total Time spent with patient: 45 minutes  Subjective:   [name] is a 39 y.o. female patient need substance abuse rehab.  HPI:  39 yo female presented to the ED with psychosis after cocaine abuse.  On assessment, she demands her Suboxone because it got "stolen".  The police told her to complete a police report but she has not.  When we told her we don't give Suboxone, she asked for Vicodin; very medication  focused.  Denies suicidal/homicidal ideations, hallucinations, and no withdrawal symptoms except some nausea--but going to go to Dr. Elta Guadeloupe for suboxone, Zofran given.  Stable for discharge. HPI Elements:   Location:  generalized. Quality:  acute. Severity:  mild. Timing:  intermittent. Duration:  brief. Context:  cocaine use.  Past Medical History:  Past Medical History  Diagnosis Date  . Thyroid disease   . Chronic back pain   . Hypothyroidism   . Anxiety   . GERD (gastroesophageal reflux disease)   . Sleep apnea     cpap  . Headache(784.0)   . Neuromuscular disorder     neuropathy  . Arthritis     knees bilateral   DJD  stenosis   . Blood dyscrasia     HSV  . PCOS (polycystic ovarian syndrome)   . Cancer     vaginal  malignant carcinoma  . Alcohol abuse   . Drug-seeking behavior   . Polysubstance abuse     Past Surgical History  Procedure Laterality Date  . Back surgery    . Cesarean section    . Tonsillectomy    . Tubal ligation    . Fracture surgery     Family History:  Family History  Problem Relation Age of Onset  . Arthritis Mother   . Clotting disorder Mother   . Hyperlipidemia Father   . Leukemia Maternal Grandmother   . Diabetes Maternal Grandfather   . Stroke Paternal Grandmother   . Heart disease Father   . Diabetes Mother   . Hypertension Mother   . Prostate cancer Father   . Heart disease Paternal Grandmother    Social History:  History  Alcohol Use No  Comment: denies alcohol since 12/16     History  Drug Use  . Yes    Comment: narcotic abuse    Social History   Social History  . Marital Status: Divorced    Spouse Name: N/A  . Number of Children: N/A  . Years of Education: N/A   Occupational History  . disability     RN; Psychologist, occupational at substance abuse counselor at [employer] History Main Topics  . Smoking status: Current Every [place] Smoker -- 1.00 packs/[place] for 20 years    Types: Cigarettes  . Smokeless tobacco:  Never Used  . Alcohol Use: No     Comment: denies alcohol since 12/16  . Drug Use: Yes     Comment: narcotic abuse  . Sexual Activity: Yes    Birth Control/ Protection: Surgical   Other Topics Concern  . None   Social History Narrative   Additional Social History:                          Allergies:   Allergies  Allergen Reactions  . Moxifloxacin Hives  . Penicillins Hives  . Quinolones Hives    Hives with moxifloxacin and levofloxacin    Labs:  Results for orders placed or performed during the hospital encounter of 04/21/15 (from the past 48 hour(s))  CBC     Status: None   Collection Time: 04/21/15  1:57 PM  Result Value Ref Range   WBC 9.9 4.0 - 10.5 K/uL   RBC 4.27 3.87 - 5.11 MIL/uL   Hemoglobin 12.9 12.0 - 15.0 g/dL   HCT 38.8 36.0 - 46.0 %   MCV 90.9 78.0 - 100.0 fL   MCH 30.2 26.0 - 34.0 pg   MCHC 33.2 30.0 - 36.0 g/dL   RDW 13.8 11.5 - 15.5 %   Platelets 184 150 - 400 K/uL  Comprehensive metabolic panel     Status: Abnormal   Collection Time: 04/21/15  1:57 PM  Result Value Ref Range   Sodium 139 135 - 145 mmol/L   Potassium 4.2 3.5 - 5.1 mmol/L   Chloride 103 101 - 111 mmol/L   CO2 25 22 - 32 mmol/L   Glucose, Bld 99 65 - 99 mg/dL   BUN 16 6 - 20 mg/dL   Creatinine, Ser 1.33 (H) 0.44 - 1.00 mg/dL   Calcium 9.8 8.9 - 10.3 mg/dL   Total Protein 7.8 6.5 - 8.1 g/dL   Albumin 4.5 3.5 - 5.0 g/dL   AST 43 (H) 15 - 41 U/L   ALT 29 14 - 54 U/L   Alkaline Phosphatase 62 38 - 126 U/L   Total Bilirubin 0.8 0.3 - 1.2 mg/dL   GFR calc non Af Amer 49 (L) >60 mL/min   GFR calc Af Amer 57 (L) >60 mL/min    Comment: (NOTE) The eGFR has been calculated using the CKD EPI equation. This calculation has not been validated in all clinical situations. eGFR's persistently <60 mL/min signify possible Chronic Kidney Disease.    Anion gap 11 5 - 15  Ethanol     Status: None   Collection Time: 04/21/15  1:57 PM  Result Value Ref Range   Alcohol, Ethyl (B) <5  <5 mg/dL    Comment:        LOWEST DETECTABLE LIMIT FOR SERUM ALCOHOL IS 5 mg/dL FOR MEDICAL PURPOSES ONLY   Salicylate level     Status: None  Collection Time: 04/21/15  1:57 PM  Result Value Ref Range   Salicylate Lvl <7.4 2.8 - 30.0 mg/dL  Acetaminophen level     Status: Abnormal   Collection Time: 04/21/15  1:57 PM  Result Value Ref Range   Acetaminophen (Tylenol), Serum <10 (L) 10 - 30 ug/mL    Comment:        THERAPEUTIC CONCENTRATIONS VARY SIGNIFICANTLY. A RANGE OF 10-30 ug/mL MAY BE AN EFFECTIVE CONCENTRATION FOR MANY PATIENTS. HOWEVER, SOME ARE BEST TREATED AT CONCENTRATIONS OUTSIDE THIS RANGE. ACETAMINOPHEN CONCENTRATIONS >150 ug/mL AT 4 HOURS AFTER INGESTION AND >50 ug/mL AT 12 HOURS AFTER INGESTION ARE OFTEN ASSOCIATED WITH TOXIC REACTIONS.   Urine rapid drug screen (hosp performed)     Status: Abnormal   Collection Time: 04/21/15  4:00 PM  Result Value Ref Range   Opiates NONE DETECTED NONE DETECTED   Cocaine NONE DETECTED NONE DETECTED   Benzodiazepines NONE DETECTED NONE DETECTED   Amphetamines NONE DETECTED NONE DETECTED   Tetrahydrocannabinol POSITIVE (A) NONE DETECTED   Barbiturates NONE DETECTED NONE DETECTED    Comment:        DRUG SCREEN FOR MEDICAL PURPOSES ONLY.  IF CONFIRMATION IS NEEDED FOR ANY PURPOSE, NOTIFY LAB WITHIN 5 DAYS.        LOWEST DETECTABLE LIMITS FOR URINE DRUG SCREEN Drug Class       Cutoff (ng/mL) Amphetamine      1000 Barbiturate      200 Benzodiazepine   944 Tricyclics       967 Opiates          300 Cocaine          300 THC              50   Urinalysis, Routine w reflex microscopic (not at [facility])     Status: Abnormal   Collection Time: 04/21/15  4:00 PM  Result Value Ref Range   Color, Urine AMBER (A) YELLOW    Comment: BIOCHEMICALS MAY BE AFFECTED BY COLOR   APPearance CLOUDY (A) CLEAR   Specific Gravity, Urine 1.039 (H) 1.005 - 1.030   pH 6.0 5.0 - 8.0   Glucose, UA NEGATIVE NEGATIVE mg/dL   Hgb urine  dipstick LARGE (A) NEGATIVE   Bilirubin Urine SMALL (A) NEGATIVE   Ketones, ur NEGATIVE NEGATIVE mg/dL   Protein, ur 30 (A) NEGATIVE mg/dL   Urobilinogen, UA 1.0 0.0 - 1.0 mg/dL   Nitrite NEGATIVE NEGATIVE   Leukocytes, UA SMALL (A) NEGATIVE  Urine microscopic-add on     Status: Abnormal   Collection Time: 04/21/15  4:00 PM  Result Value Ref Range   Squamous Epithelial / LPF MANY (A) RARE   WBC, UA 3-6 <3 WBC/hpf   RBC / HPF 3-6 <3 RBC/hpf   Bacteria, UA FEW (A) RARE   Urine-Other MUCOUS PRESENT     Vitals: Blood pressure 140/92, pulse 101, temperature 98.8 F (37.1 C), temperature source Oral, resp. rate 20, SpO2 99 %.  Risk to Self: Is patient at risk for suicide?: No, but patient needs Medical Clearance Risk to Others:   Prior Inpatient Therapy:   Prior Outpatient Therapy:    Current Facility-Administered Medications  Medication Dose Route Frequency Provider Last Rate Last Dose  . alum & mag hydroxide-simeth (MAALOX/MYLANTA) 200-200-20 MG/5ML suspension 30 mL  30 mL Oral PRN Robyn M Hess, PA-C      . diphenhydrAMINE (BENADRYL) injection 50 mg  50 mg Intramuscular Once PRN Patrecia Pour, NP  50 mg at 04/21/15 1958  . hydrOXYzine (ATARAX/VISTARIL) tablet 50 mg  50 mg Oral Q6H PRN Patrecia Pour, NP   50 mg at 04/22/15 0910  . ibuprofen (ADVIL,MOTRIN) tablet 600 mg  600 mg Oral Q8H PRN Carman Ching, PA-C   600 mg at 04/22/15 0518  . levothyroxine (SYNTHROID, LEVOTHROID) tablet 75 mcg  75 mcg Oral QAC breakfast Patrecia Pour, NP   75 mcg at 04/22/15 0904  . LORazepam (ATIVAN) injection 2 mg  2 mg Intramuscular Once PRN Patrecia Pour, NP   2 mg at 04/21/15 2340  . LORazepam (ATIVAN) tablet 1 mg  1 mg Oral Q8H PRN Carman Ching, PA-C   1 mg at 04/21/15 1804  . nicotine (NICODERM CQ - dosed in mg/24 hours) patch 21 mg  21 mg Transdermal Daily Carman Ching, PA-C   21 mg at 04/21/15 1752  . OLANZapine (ZYPREXA) injection 10 mg  10 mg Intramuscular Once PRN Patrecia Pour, NP   10 mg  at 04/21/15 1957  . ondansetron (ZOFRAN) tablet 4 mg  4 mg Oral Q8H PRN Hessie Diener Hess, PA-C   4 mg at 04/22/15 1005  . pantoprazole (PROTONIX) EC tablet 20 mg  20 mg Oral Daily Patrecia Pour, NP   20 mg at 04/22/15 7588  . zolpidem (AMBIEN) tablet 5 mg  5 mg Oral QHS PRN Carman Ching, PA-C   5 mg at 04/21/15 2114   Current Outpatient Prescriptions  Medication Sig Dispense Refill  . buprenorphine (SUBUTEX) 8 MG SUBL SL tablet Place under the tongue daily.    . DULoxetine (CYMBALTA) 60 MG capsule Take 60 mg by mouth daily.    Marland Kitchen gabapentin (NEURONTIN) 600 MG tablet Take 1,200 mg by mouth 4 (four) times daily.    Marland Kitchen levothyroxine (SYNTHROID, LEVOTHROID) 75 MCG tablet Take 1 tablet (75 mcg total) by mouth daily before breakfast. 90 tablet 0  . mirtazapine (REMERON) 30 MG tablet Take as directed 30 tablet 0  . tiZANidine (ZANAFLEX) 2 MG tablet Take 2 mg by mouth 3 (three) times daily.    Marland Kitchen zolpidem (AMBIEN) 10 MG tablet Take 10 mg by mouth at bedtime as needed for sleep.    Marland Kitchen gabapentin (NEURONTIN) 400 MG capsule Take 800 mg by mouth 4 (four) times daily.    Marland Kitchen HYDROcodone-acetaminophen (NORCO/VICODIN) 5-325 MG per tablet Take 1-2 tablets by mouth every 6 (six) hours as needed for moderate pain or severe pain. (Patient not taking: Reported on 08/23/2014) 10 tablet 0  . HYDROmorphone (DILAUDID) 2 MG tablet Take 1 tablet (2 mg total) by mouth every 12 (twelve) hours as needed for severe pain. (Patient not taking: Reported on 08/16/2014) 6 tablet 0  . hydrOXYzine (ATARAX/VISTARIL) 50 MG tablet Take 1 tablet (50 mg total) by mouth every 6 (six) hours as needed for nausea or vomiting. 30 tablet 1  . ibuprofen (ADVIL,MOTRIN) 200 MG tablet Take 800 mg by mouth every 6 (six) hours as needed.    Marland Kitchen levothyroxine (SYNTHROID, LEVOTHROID) 50 MCG tablet TAKE ONE TABLET BY MOUTH ONCE DAILY BEFORE  BREAKFAST (Patient not taking: Reported on 04/21/2015) 30 tablet 2  . methocarbamol (ROBAXIN) 500 MG tablet Take 1 tablet (500  mg total) by mouth every 8 (eight) hours as needed for muscle spasms. (Patient not taking: Reported on 08/20/2014) 28 tablet 0  . oxyCODONE-acetaminophen (ROXICET) 5-325 MG per tablet Take 2 tablets by mouth every 6 (six) hours as needed for severe pain. (  Patient not taking: Reported on 08/23/2014) 30 tablet 0  . PARoxetine (PAXIL) 40 MG tablet Take 1 tablet (40 mg total) by mouth daily. 30 tablet 0  . promethazine (PHENERGAN) 25 MG tablet Take 1 tablet (25 mg total) by mouth every 6 (six) hours as needed for nausea or vomiting. (Patient not taking: Reported on 08/20/2014) 10 tablet 0  . sucralfate (CARAFATE) 1 G tablet Take 1 tablet (1 g total) by mouth 4 (four) times daily -  with meals and at bedtime. (Patient not taking: Reported on 08/25/2014) 30 tablet 0  . tiZANidine (ZANAFLEX) 4 MG capsule TK ONE C PO TID  6  . traMADol (ULTRAM) 50 MG tablet Take 2 tablets (100 mg total) by mouth every 6 (six) hours as needed. 20 tablet 0    Musculoskeletal: Strength & Muscle Tone: within normal limits Gait & Station: normal Patient leans: N/A  Psychiatric Specialty Exam: Physical Exam  Review of Systems  Constitutional: Negative.   HENT: Negative.   Eyes: Negative.   Respiratory: Negative.   Cardiovascular: Negative.   Gastrointestinal: Negative.   Genitourinary: Negative.   Musculoskeletal: Negative.   Skin: Negative.   Neurological: Negative.   Endo/Heme/Allergies: Negative.   Psychiatric/Behavioral: Positive for substance abuse.    Blood pressure 140/92, pulse 101, temperature 98.8 F (37.1 C), temperature source Oral, resp. rate 20, SpO2 99 %.There is no weight on file to calculate BMI.  General Appearance: Disheveled  Eye Contact::  Good  Speech:  Normal Rate  Volume:  Normal  Mood:  Anxious, mild  Affect:  Congruent  Thought Process:  Coherent  Orientation:  Full (Time, Place, and Person)  Thought Content:  WDL  Suicidal Thoughts:  No  Homicidal Thoughts:  No  Memory:  Immediate;    Good Recent;   Good Remote;   Good  Judgement:  Fair  Insight:  Fair  Psychomotor Activity:  Normal  Concentration:  Good  Recall:  Good  Fund of Knowledge:Good  Language: Good  Akathisia:  No  Handed:  Right  AIMS (if indicated):     Assets:  Leisure Time Physical Health Resilience  ADL's:  Intact  Cognition: WNL  Sleep:      Medical Decision Making: Review of Psycho-Social Stressors (1), Review or order clinical lab tests (1) and Review of Medication Regimen & Side Effects (2)  Treatment Plan Summary: Daily contact with patient to assess and evaluate symptoms and progress in treatment, Medication management and Plan substance induced mood disorder: -Crisis stabilization -Medication management--antipsychotic medications ordered for psychosis, Zyprexa 10 mg -Substance abuse counseling  Plan:  No evidence of imminent risk to self or others at present.   Disposition: Discharge home with substance abuse resources  Waylan Boga, [place] 04/22/2015 10:15 AM Patient seen face-to-face for psychiatric evaluation, chart reviewed and case discussed with the physician extender and developed treatment plan. Reviewed the information documented and agree with the treatment plan. Corena Pilgrim, MD

## 2015-04-22 NOTE — ED Notes (Signed)
Patient met with physician and NP this am. They felt that she should get help outpatient. Resources given to her at this time.

## 2015-04-22 NOTE — ED Notes (Signed)
Patient pleasant on approach this am. Focusing on medication this am. Worried about her "Subutex" that she is suppose to be on. Doesn't remember a lot of what happened last night prior to coming to hospital. Reports some confusion at times. Probable discharge today

## 2015-04-22 NOTE — [facility] Suicide Risk Assessment (Signed)
Suicide Risk Assessment  Discharge Assessment   [facility] Discharge Suicide Risk Assessment   Demographic Factors:  Caucasian  Total Time spent with patient: 45 minutes   Musculoskeletal: Strength & Muscle Tone: within normal limits Gait & Station: normal Patient leans: N/A  Psychiatric Specialty Exam: Physical Exam  Review of Systems  Constitutional: Negative.   HENT: Negative.   Eyes: Negative.   Respiratory: Negative.   Cardiovascular: Negative.   Gastrointestinal: Negative.   Genitourinary: Negative.   Musculoskeletal: Negative.   Skin: Negative.   Neurological: Negative.   Endo/Heme/Allergies: Negative.   Psychiatric/Behavioral: Positive for substance abuse.    Blood pressure 140/92, pulse 101, temperature 98.8 F (37.1 C), temperature source Oral, resp. rate 20, SpO2 99 %.There is no weight on file to calculate BMI.  General Appearance: Disheveled  Eye Contact::  Good  Speech:  Normal Rate  Volume:  Normal  Mood:  Anxious, mild  Affect:  Congruent  Thought Process:  Coherent  Orientation:  Full (Time, Place, and Person)  Thought Content:  WDL  Suicidal Thoughts:  No  Homicidal Thoughts:  No  Memory:  Immediate;   Good Recent;   Good Remote;   Good  Judgement:  Fair  Insight:  Fair  Psychomotor Activity:  Normal  Concentration:  Good  Recall:  Good  Fund of Knowledge:Good  Language: Good  Akathisia:  No  Handed:  Right  AIMS (if indicated):     Assets:  Leisure Time Physical Health Resilience  ADL's:  Intact  Cognition: WNL  Sleep:         Has this patient used any form of tobacco in the last 30 days? (Cigarettes, Smokeless Tobacco, Cigars, and/or Pipes) Yes, A prescription for an FDA-approved tobacco cessation medication was offered at discharge and the patient refused  Mental Status Per Nursing Assessment::   On Admission:   Drug induced psychosis  Current Mental Status by Physician: NA  Loss Factors: NA  Historical Factors: NA  Risk  Reduction Factors:   NA  Continued Clinical Symptoms:  Anxiety, mild  Cognitive Features That Contribute To Risk:  None    Suicide Risk:  Minimal: No identifiable suicidal ideation.  Patients presenting with no risk factors but with morbid ruminations; may be classified as minimal risk based on the severity of the depressive symptoms  Principal Problem: Substance induced mood disorder Discharge Diagnoses:  Patient Active Problem List   Diagnosis Date Noted  . Substance induced mood disorder [F19.94] 05/03/2014    Priority: High  . Cocaine abuse with intoxication with perceptual disturbance [F14.122] 04/12/2014    Priority: High  . Alcohol dependence with withdrawal with complication [S28.768] 11/57/2620    Priority: High  . ETOH abuse [F10.10] 07/18/2014  . Alcoholic pancreatitis [B55.9] 07/18/2014  . Drug-seeking behavior [F19.10] 07/18/2014  . Acute pancreatitis [K85.9] 07/16/2014  . Alcohol intoxication in active alcoholic without complication [R41.638]   . Depression [F32.9]   . Polysubstance dependence including opioid type drug, episodic abuse [F11.229] 06/26/2014  . Alcohol dependence with uncomplicated withdrawal [G53.646] 04/03/2014  . Herpes labialis [B00.1] 10/08/2013  . Lumbar radiculopathy, chronic [M54.16] 10/08/2013  . Nicotine addiction [F17.200] 07/30/2012  . Fall down stairs [W10.8XXA] 05/27/2012  . Foot fracture [S92.909A] 05/27/2012  . Obesity, Class III, BMI 40-49.9 (morbid obesity) [E66.01] 10/31/2011  . Mood disorder [F39] 10/31/2011  . Hypothyroidism [E03.9] 10/31/2011  . Obstructive sleep apnea [G47.33] 10/31/2011  . 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 Chronic back pain [M54.9, G89.29]  Plan Of Care/Follow-up recommendations:  Activity:  as tolerated Diet:  heart healthy diet  Is patient on multiple antipsychotic therapies at discharge:  No   Has Patient had three or more failed trials of antipsychotic monotherapy by history:   No  Recommended Plan for Multiple Antipsychotic Therapies: NA    LORD, JAMISON, PMH-NP 04/22/2015, 10:32 AM

## 2015-04-22 NOTE — Discharge Instructions (Signed)
To help you maintain a sober lifestyle, a substance abuse treatment program may be beneficial to you.  You have also indicated an interest in receiving Suboxone treatment.  Contact the following treatment programs to ask about enrolling in their programs:       [facility]      [address] [place]. [place], [place] [address]      [phone]      New patients are seen at the walk-in clinic every Tuesday from 9:00 am - 12:00 pm.       [facility]      [place], [place] [address]       [phone]       [facility]      [address] [place], [place] [address]      [phone]

## 2015-05-07 ENCOUNTER — Telehealth: Payer: Self-pay

## 2015-05-07 ENCOUNTER — Other Ambulatory Visit: Payer: Self-pay

## 2015-05-07 DIAGNOSIS — G8929 Other chronic pain: Secondary | ICD-10-CM

## 2015-05-07 DIAGNOSIS — M549 Dorsalgia, unspecified: Principal | ICD-10-CM

## 2015-05-17 ENCOUNTER — Telehealth: Payer: Self-pay

## 2015-05-17 NOTE — Telephone Encounter (Signed)
Pt called again stating she felt sick because of not having the nurontin.  Please advise her   [phone]

## 2015-05-17 NOTE — Telephone Encounter (Signed)
Pt is needing a refill on nurontin, 800mg , 4 times a [place] , ambien 10mg  nightly and snthyoid 0.75 micrograms  Pt is between pain clinics right now this is why she is asking for these refills because dr mark will not refill these   Best number [place]

## 2015-05-17 NOTE — Telephone Encounter (Signed)
No We are not the prescriber on record for theses meds anymore Missed f/u for thyroid testingafter January OV(in 2 months) She'll have to come in for visit plus labs to get any meds

## 2015-05-20 NOTE — Telephone Encounter (Signed)
Spoke with pt, advised message from Dr. Doolittle. Pt understood. 

## 2015-06-24 ENCOUNTER — Encounter: Payer: Self-pay | Admitting: Internal Medicine

## 2015-07-19 IMAGING — US US ABDOMEN LIMITED
1 series · 14 of 25 positions shown · non-contrast
Comparison: None.

CLINICAL DATA: Right upper quadrant abdominal pain.

EXAM:
US ABDOMEN LIMITED - RIGHT UPPER QUADRANT

[Series 1: us abdomen limited · 0.26mm/px · 14 of 51 slices shown]
[im 1/51]
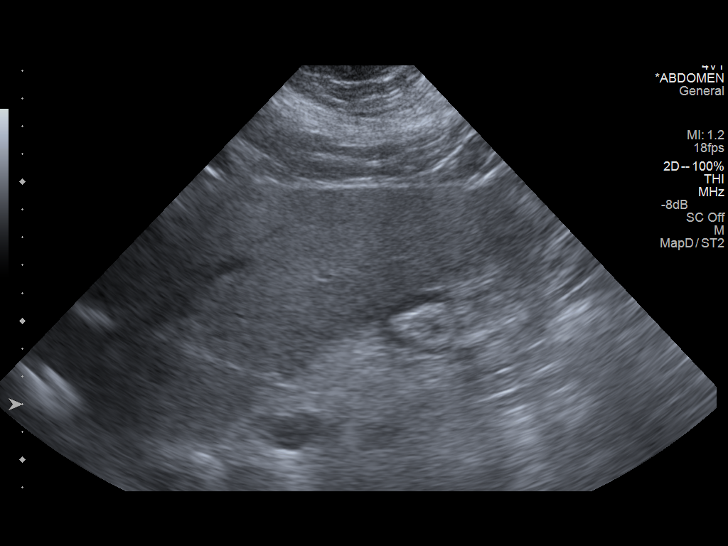
[im 5/51]
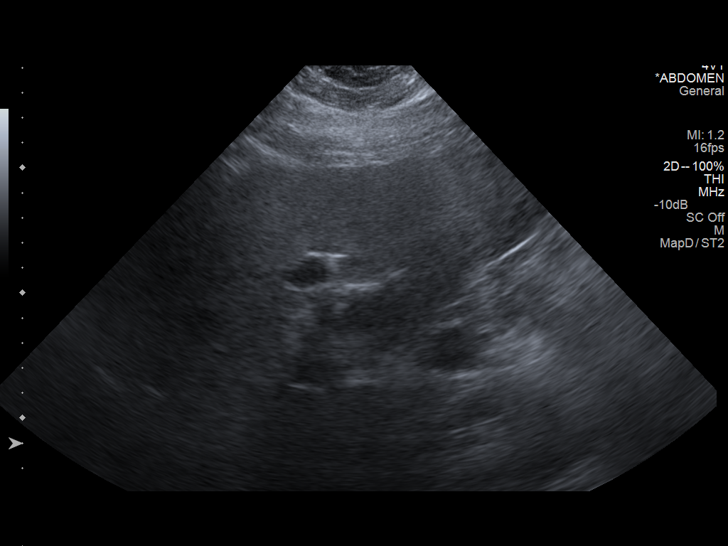
[im 9/51]
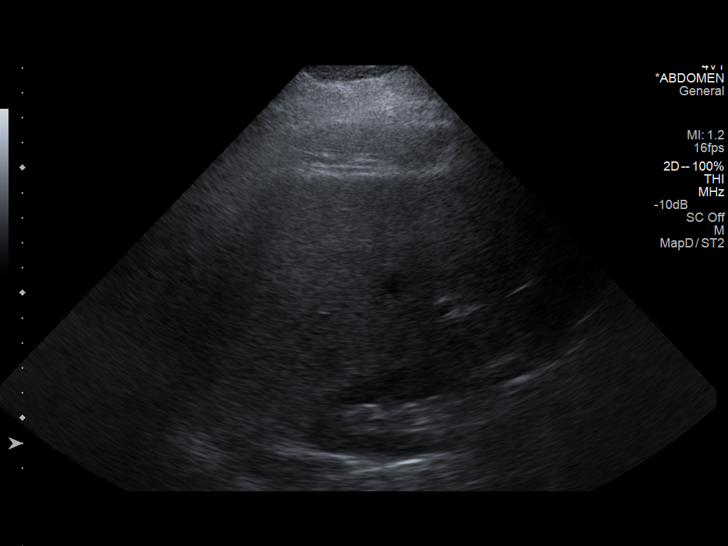
[im 13/51]
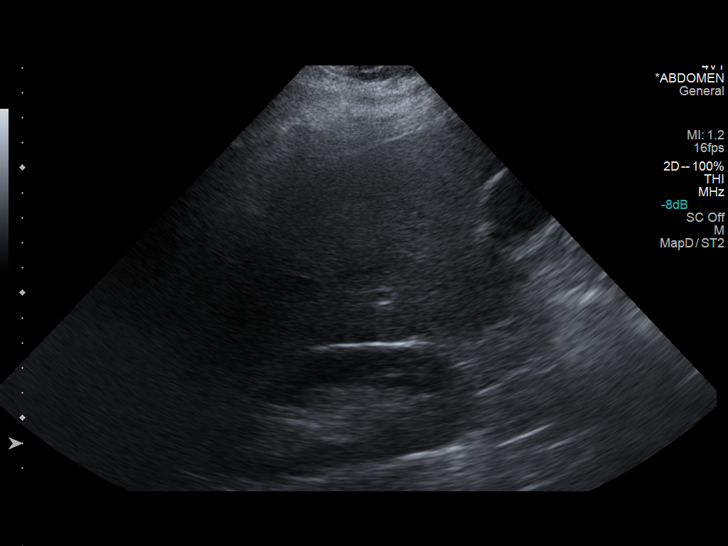
[im 17/51]
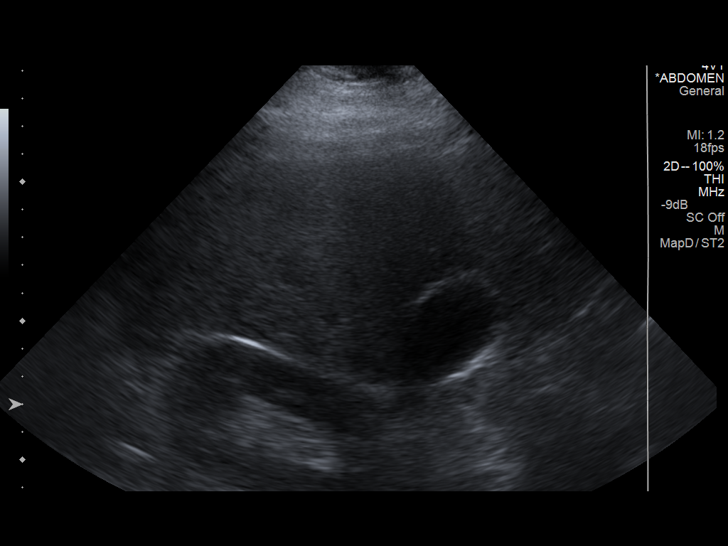
[im 19/51]
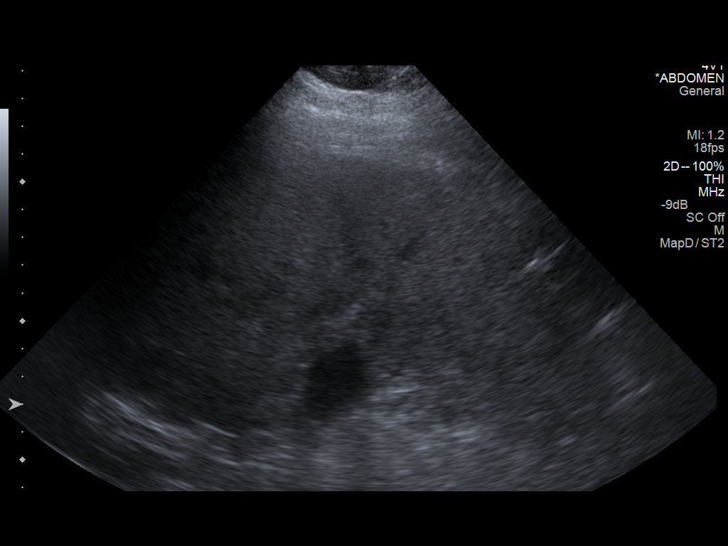
[im 23/51]
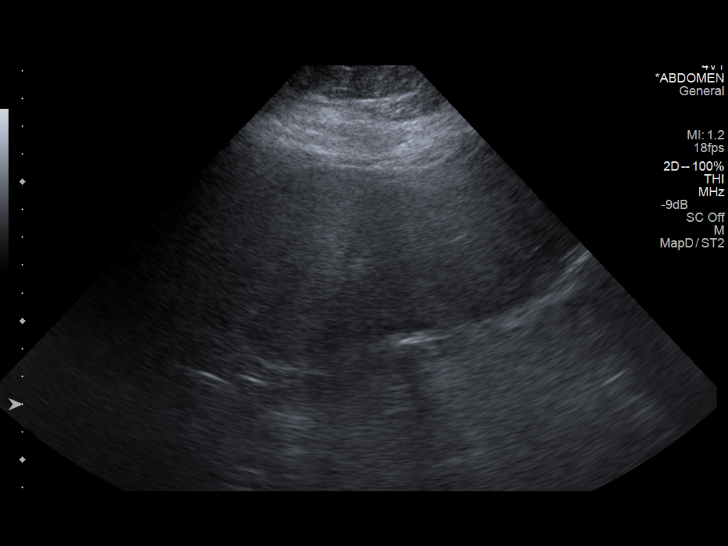
[im 28/51]
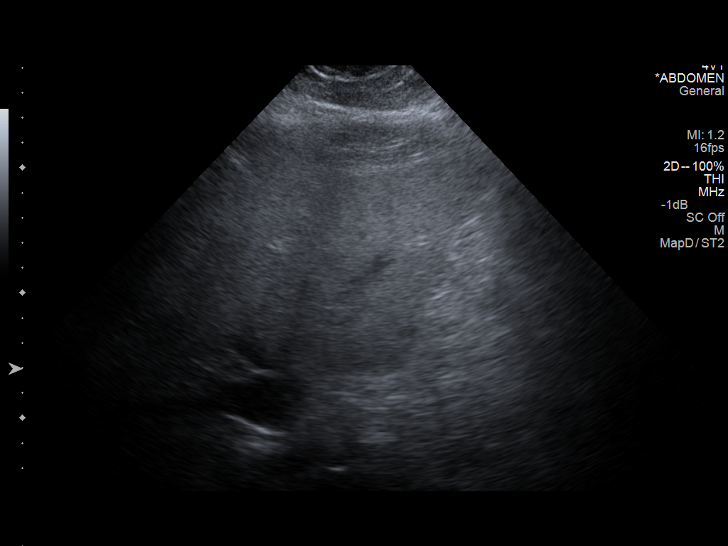
[im 32/51]
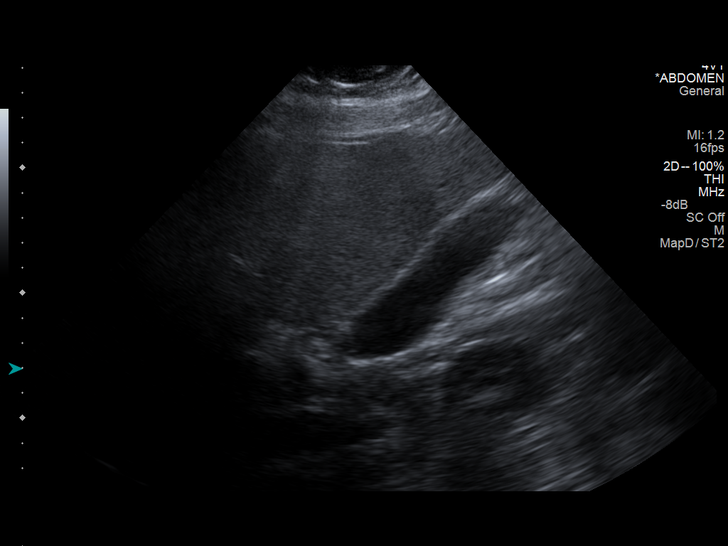
[im 34/51]
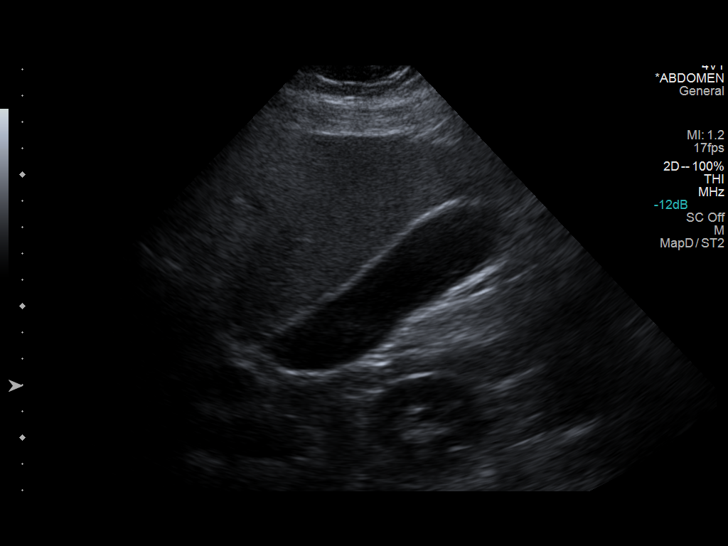
[im 38/51]
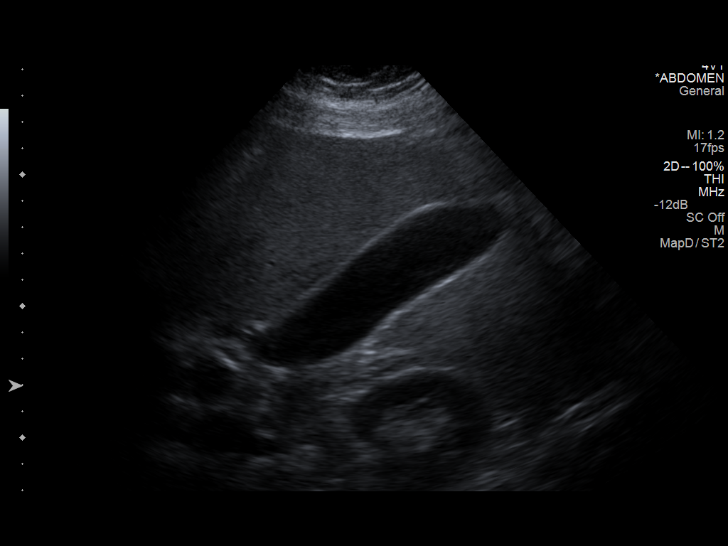
[im 42/51]
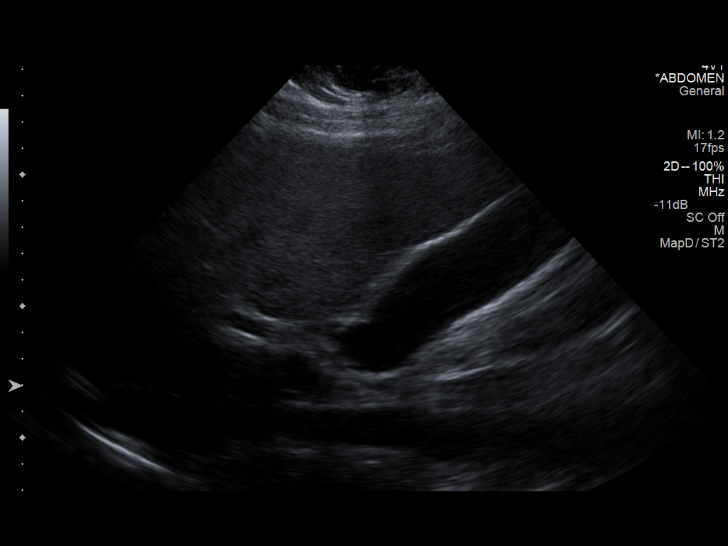
[im 46/51]
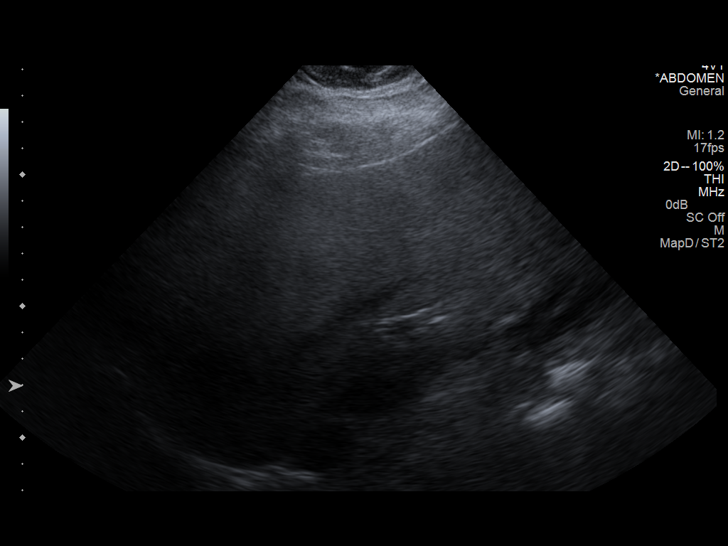
[im 51/51]
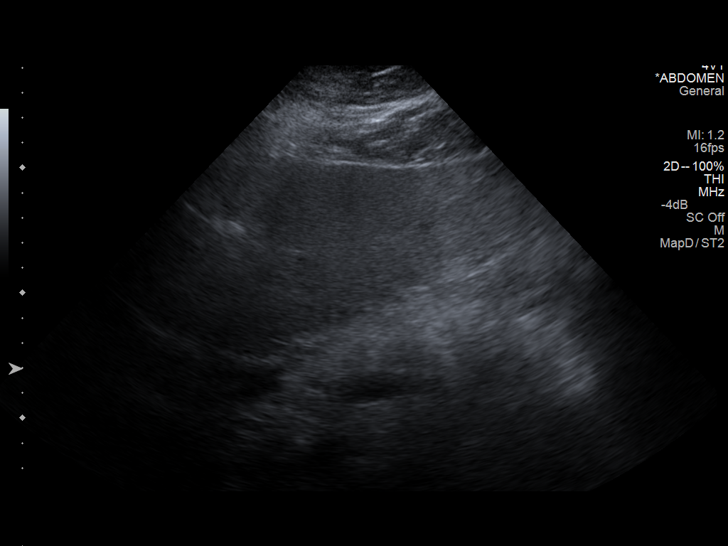

[14 of 25 positions shown; findings below may reference images not displayed]

FINDINGS: Gallbladder:

No gallstones or wall thickening visualized. No sonographic Murphy
sign noted.

Common bile duct:

Diameter: 4 mm

Liver:

Hepatic increased echogenicity likely indicates underlying steatosis
without focal abnormality or intrahepatic ductal dilatation.
IMPRESSION: Hepatic increased echogenicity likely indicates underlying steatosis
without focal abnormality or intrahepatic ductal dilatation. No
acute abnormality.

## 2015-07-25 ENCOUNTER — Other Ambulatory Visit: Payer: Self-pay | Admitting: Internal Medicine

## 2015-08-16 ENCOUNTER — Encounter (HOSPITAL_COMMUNITY): Payer: Self-pay

## 2015-08-16 ENCOUNTER — Emergency Department (HOSPITAL_COMMUNITY)
Admission: EM | Admit: 2015-08-16 | Discharge: 2015-08-17 | Disposition: A | Discharge status: Home / Self Care | Payer: Medicare HMO | Attending: Emergency Medicine | Admitting: Emergency Medicine

## 2015-08-16 ENCOUNTER — Emergency Department (HOSPITAL_COMMUNITY): Payer: Medicare HMO

## 2015-08-16 DIAGNOSIS — G8929 Other chronic pain: Secondary | ICD-10-CM | POA: Insufficient documentation

## 2015-08-16 DIAGNOSIS — Z8719 Personal history of other diseases of the digestive system: Secondary | ICD-10-CM | POA: Insufficient documentation

## 2015-08-16 DIAGNOSIS — G629 Polyneuropathy, unspecified: Secondary | ICD-10-CM | POA: Insufficient documentation

## 2015-08-16 DIAGNOSIS — R197 Diarrhea, unspecified: Secondary | ICD-10-CM | POA: Diagnosis not present

## 2015-08-16 DIAGNOSIS — Z8544 Personal history of malignant neoplasm of other female genital organs: Secondary | ICD-10-CM | POA: Diagnosis not present

## 2015-08-16 DIAGNOSIS — Z9981 Dependence on supplemental oxygen: Secondary | ICD-10-CM | POA: Insufficient documentation

## 2015-08-16 DIAGNOSIS — F17210 Nicotine dependence, cigarettes, uncomplicated: Secondary | ICD-10-CM | POA: Insufficient documentation

## 2015-08-16 DIAGNOSIS — R112 Nausea with vomiting, unspecified: Secondary | ICD-10-CM | POA: Diagnosis not present

## 2015-08-16 DIAGNOSIS — E039 Hypothyroidism, unspecified: Secondary | ICD-10-CM | POA: Insufficient documentation

## 2015-08-16 DIAGNOSIS — Z3202 Encounter for pregnancy test, result negative: Secondary | ICD-10-CM | POA: Insufficient documentation

## 2015-08-16 DIAGNOSIS — F419 Anxiety disorder, unspecified: Secondary | ICD-10-CM | POA: Insufficient documentation

## 2015-08-16 DIAGNOSIS — G4730 Sleep apnea, unspecified: Secondary | ICD-10-CM | POA: Diagnosis not present

## 2015-08-16 DIAGNOSIS — Z8739 Personal history of other diseases of the musculoskeletal system and connective tissue: Secondary | ICD-10-CM | POA: Insufficient documentation

## 2015-08-16 DIAGNOSIS — Z79899 Other long term (current) drug therapy: Secondary | ICD-10-CM | POA: Insufficient documentation

## 2015-08-16 DIAGNOSIS — R1084 Generalized abdominal pain: Secondary | ICD-10-CM | POA: Diagnosis not present

## 2015-08-16 LAB — CBC WITH DIFFERENTIAL/PLATELET
Basophils Absolute: 0 10*3/uL (ref 0.0–0.1)
Basophils Relative: 0 %
Eosinophils Absolute: 0.2 10*3/uL (ref 0.0–0.7)
Eosinophils Relative: 2 %
HCT: 41.6 % (ref 36.0–46.0)
Hemoglobin: 14.2 g/dL (ref 12.0–15.0)
Lymphocytes Relative: 18 %
Lymphs Abs: 1.6 10*3/uL (ref 0.7–4.0)
MCH: 31.3 pg (ref 26.0–34.0)
MCHC: 34.1 g/dL (ref 30.0–36.0)
MCV: 91.8 fL (ref 78.0–100.0)
Monocytes Absolute: 0.5 10*3/uL (ref 0.1–1.0)
Monocytes Relative: 5 %
Neutro Abs: 6.5 10*3/uL (ref 1.7–7.7)
Neutrophils Relative %: 75 %
Platelets: 176 10*3/uL (ref 150–400)
RBC: 4.53 MIL/uL (ref 3.87–5.11)
RDW: 14.1 % (ref 11.5–15.5)
WBC: 8.7 10*3/uL (ref 4.0–10.5)

## 2015-08-16 LAB — COMPREHENSIVE METABOLIC PANEL
ALT: 16 U/L (ref 14–54)
AST: 16 U/L (ref 15–41)
Albumin: 4.1 g/dL (ref 3.5–5.0)
Alkaline Phosphatase: 62 U/L (ref 38–126)
Anion gap: 8 (ref 5–15)
BUN: 14 mg/dL (ref 6–20)
CO2: 26 mmol/L (ref 22–32)
Calcium: 9 mg/dL (ref 8.9–10.3)
Chloride: 104 mmol/L (ref 101–111)
Creatinine, Ser: 0.82 mg/dL (ref 0.44–1.00)
GFR calc Af Amer: >60 mL/min (ref >60)
GFR calc non Af Amer: >60 mL/min (ref >60)
Glucose, Bld: 101 mg/dL — ABNORMAL HIGH (ref 65–99)
Potassium: 4.1 mmol/L (ref 3.5–5.1)
Sodium: 138 mmol/L (ref 135–145)
Total Bilirubin: 0.7 mg/dL (ref 0.3–1.2)
Total Protein: 7.2 g/dL (ref 6.5–8.1)

## 2015-08-16 LAB — LIPASE, BLOOD: Lipase: 19 U/L (ref 11–51)

## 2015-08-16 LAB — RAPID URINE DRUG SCREEN, HOSP PERFORMED
Amphetamines: NOT DETECTED
Barbiturates: NOT DETECTED
Benzodiazepines: NOT DETECTED
Cocaine: NOT DETECTED
Opiates: NOT DETECTED
Tetrahydrocannabinol: NOT DETECTED

## 2015-08-16 LAB — URINALYSIS, ROUTINE W REFLEX MICROSCOPIC
Bilirubin Urine: NEGATIVE
Glucose, UA: NEGATIVE mg/dL
Hgb urine dipstick: NEGATIVE
Ketones, ur: NEGATIVE mg/dL
Nitrite: NEGATIVE
Protein, ur: NEGATIVE mg/dL
Specific Gravity, Urine: 1.029 (ref 1.005–1.030)
pH: 5.5 (ref 5.0–8.0)

## 2015-08-16 LAB — POC URINE PREG, ED: Preg Test, Ur: NEGATIVE

## 2015-08-16 LAB — URINE MICROSCOPIC-ADD ON

## 2015-08-16 MED ORDER — ONDANSETRON HCL 4 MG/2ML IJ SOLN
4.0000 mg | Freq: Once | INTRAMUSCULAR | Status: AC
  Administered 2015-08-16: 4 mg via INTRAVENOUS
  Filled 2015-08-16: qty 2

## 2015-08-16 MED ORDER — IOHEXOL 300 MG/ML  SOLN
100.0000 mL | Freq: Once | INTRAMUSCULAR | Status: AC | PRN
  Administered 2015-08-16: 100 mL via INTRAVENOUS

## 2015-08-16 MED ORDER — KETOROLAC TROMETHAMINE 30 MG/ML IJ SOLN
30.0000 mg | Freq: Once | INTRAMUSCULAR | Status: AC
  Administered 2015-08-16: 30 mg via INTRAVENOUS
  Filled 2015-08-16: qty 1

## 2015-08-16 MED ORDER — SODIUM CHLORIDE 0.9 % IV BOLUS (SEPSIS)
1000.0000 mL | Freq: Once | INTRAVENOUS | Status: AC
  Administered 2015-08-16: 1000 mL via INTRAVENOUS

## 2015-08-16 MED ORDER — IOHEXOL 300 MG/ML  SOLN
25.0000 mL | Freq: Once | INTRAMUSCULAR | Status: AC | PRN
  Administered 2015-08-16: 25 mL via ORAL

## 2015-08-16 MED ORDER — LORAZEPAM 2 MG/ML IJ SOLN
1.0000 mg | Freq: Once | INTRAMUSCULAR | Status: AC
  Administered 2015-08-16: 1 mg via INTRAVENOUS
  Filled 2015-08-16: qty 1

## 2015-08-16 NOTE — ED Notes (Signed)
Pt requesting pain and anxiety medication.

## 2015-08-16 NOTE — ED Notes (Signed)
Pt states she feels nauseated after consuming PO Contrast. PA made aware.

## 2015-08-16 NOTE — ED Notes (Signed)
RN discovers pt rubbing [employer] Sanitizer on her face and chin area.

## 2015-08-16 NOTE — ED Notes (Signed)
Pt states she is ready to be discharged and go home.

## 2015-08-16 NOTE — ED Notes (Signed)
Pt states "I feel like I slipped through the cracks, I have not received my pain medication like the PA said he would." PA informed of pts concerns.

## 2015-08-16 NOTE — ED Provider Notes (Signed)
CSN: [id number]     Arrival date & time 08/16/15  1909 History   First MD Initiated Contact with Patient 08/16/15 1924     Chief Complaint  Patient presents with  . Abdominal Pain  . Nausea  . Diarrhea    HPI   40 year old female presents today with nausea vomiting and diarrhea. She reports symptoms started 5 days ago in her lower abdomen, she describes the pain as burning, constant discomfort. She reports symptoms have not improved, she states occipital 12 episodes of vomiting yesterday, none today. She reports that the diarrhea has been profuse and watery with no blood or mucus. She denies any close sick contacts, exposure to abnormal food or drink. She reports that she took amoxicillin approximately 2 weeks ago for an infected tooth. The patient denies any fever at home, has not taken any over-the-counter medications. 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 Patient denies any focal abdominal pain. She denies any changes in her urinary color clarity or characteristics, no vaginal discharge.   Past Medical History  Diagnosis Date  . Thyroid disease   . Chronic back pain   . Hypothyroidism   . Anxiety   . GERD (gastroesophageal reflux disease)   . Sleep apnea     cpap  . Headache(784.0)   . Neuromuscular disorder (HCC)     neuropathy  . Arthritis     knees bilateral   DJD  stenosis   . Blood dyscrasia     HSV  . PCOS (polycystic ovarian syndrome)   . Cancer ([place])     vaginal  malignant carcinoma  . Alcohol abuse   . Drug-seeking behavior   . Polysubstance abuse    Past Surgical History  Procedure Laterality Date  . Back surgery    . Cesarean section    . Tonsillectomy    . Tubal ligation    . Fracture surgery     Family History  Problem Relation Age of Onset  . Arthritis Mother   . Clotting disorder Mother   . Hyperlipidemia Father   . Leukemia Maternal Grandmother    . Diabetes Maternal Grandfather   . Stroke Paternal Grandmother   . Heart disease Father   . Diabetes Mother   . Hypertension Mother   . Prostate cancer Father   . Heart disease Paternal Grandmother    Social History  Substance Use Topics  . Smoking status: Current Every Bale Smoker -- 1.00 packs/Rule for 20 years    Types: Cigarettes  . Smokeless tobacco: Never Used  . Alcohol Use: No     Comment: denies alcohol since 12/16   OB History    Gravida Para Term Preterm AB TAB SAB Ectopic Multiple Living   1 1 1             Review of Systems  All other systems reviewed and are negative.   Allergies  Moxifloxacin and Quinolones  Home Medications   Prior to Admission medications   Medication Sig Start Date End Date Taking? Authorizing Provider  buprenorphine (SUBUTEX) 8 MG SUBL SL tablet Place under the tongue 3 (three) times daily.    Yes Historical Provider, MD  clonazePAM (KLONOPIN) 1 MG tablet Take 1 mg by mouth 3 (three) times daily as needed for anxiety.   Yes Historical Provider, MD  gabapentin (NEURONTIN) 300 MG capsule Take 900 mg by mouth 3 (three) times daily.  Yes Historical Provider, MD  levothyroxine (SYNTHROID, LEVOTHROID) 75 MCG tablet Take 1 tablet (75 mcg total) by mouth daily before breakfast. 08/25/14  Yes Leandrew Koyanagi, MD  tiZANidine (ZANAFLEX) 4 MG capsule TK ONE C PO TID 03/22/15  Yes Historical Provider, MD  zolpidem (AMBIEN) 10 MG tablet Take 10 mg by mouth at bedtime as needed for sleep.   Yes Historical Provider, MD  HYDROcodone-acetaminophen (NORCO/VICODIN) 5-325 MG per tablet Take 1-2 tablets by mouth every 6 (six) hours as needed for moderate pain or severe pain. Patient not taking: Reported on 08/23/2014 07/31/14   Tatyana Kirichenko, PA-C  HYDROmorphone (DILAUDID) 2 MG tablet Take 1 tablet (2 mg total) by mouth every 12 (twelve) hours as needed for severe pain. Patient not taking: Reported on 08/16/2014 07/18/14   Flonnie Overman Dhungel, MD  hydrOXYzine  (ATARAX/VISTARIL) 50 MG tablet Take 1 tablet (50 mg total) by mouth every 6 (six) hours as needed for nausea or vomiting. Patient not taking: Reported on 08/16/2015 08/25/14   Leandrew Koyanagi, MD  levothyroxine (SYNTHROID, LEVOTHROID) 50 MCG tablet TAKE ONE TABLET BY MOUTH ONCE DAILY BEFORE  BREAKFAST Patient not taking: Reported on 04/21/2015 06/27/14   Benjamine Mola, FNP  loperamide (IMODIUM) 2 MG capsule Take 1 capsule (2 mg total) by mouth 4 (four) times daily as needed for diarrhea or loose stools. 08/17/15   Okey Regal, PA-C  methocarbamol (ROBAXIN) 500 MG tablet Take 1 tablet (500 mg total) by mouth every 8 (eight) hours as needed for muscle spasms. Patient not taking: Reported on 08/20/2014 06/27/14   Benjamine Mola, FNP  mirtazapine (REMERON) 30 MG tablet Take as directed Patient not taking: Reported on 08/16/2015 09/15/14   Leandrew Koyanagi, MD  ondansetron (ZOFRAN) 4 MG tablet Take 1 tablet (4 mg total) by mouth every 6 (six) hours. 08/17/15   Okey Regal, PA-C  oxyCODONE-acetaminophen (ROXICET) 5-325 MG per tablet Take 2 tablets by mouth every 6 (six) hours as needed for severe pain. Patient not taking: Reported on 08/23/2014 07/18/14   Nishant Dhungel, MD  PARoxetine (PAXIL) 40 MG tablet Take 1 tablet (40 mg total) by mouth daily. Patient not taking: Reported on 08/16/2015 09/15/14   Leandrew Koyanagi, MD  promethazine (PHENERGAN) 25 MG tablet Take 1 tablet (25 mg total) by mouth every 6 (six) hours as needed for nausea or vomiting. Patient not taking: Reported on 08/20/2014 07/31/14   Tatyana Kirichenko, PA-C  sucralfate (CARAFATE) 1 G tablet Take 1 tablet (1 g total) by mouth 4 (four) times daily -  with meals and at bedtime. Patient not taking: Reported on 08/25/2014 08/23/14   Loma Sousa Forcucci, PA-C  traMADol (ULTRAM) 50 MG tablet Take 2 tablets (100 mg total) by mouth every 6 (six) hours as needed. Patient not taking: Reported on 08/16/2015 08/25/14   Leandrew Koyanagi, MD   BP  148/98 mmHg  Pulse 83  Temp(Src) 98.2 F (36.8 C) (Oral)  Resp 16  SpO2 99%  LMP    Physical Exam  Constitutional: She is oriented to person, place, and time. She appears well-developed and well-nourished.  HENT:  Head: Normocephalic and atraumatic.  Eyes: Conjunctivae are normal. Pupils are equal, round, and reactive to light. Right eye exhibits no discharge. Left eye exhibits no discharge. No scleral icterus.  Neck: Normal range of motion. No JVD present. No tracheal deviation present.  Pulmonary/Chest: Effort normal. No stridor.  Abdominal: She exhibits no distension and no mass. There is tenderness. There is no rebound  and no guarding.  Tenderness to palpation of the the lower abdomen diffusely, no peritonitis, rebound guarding  Neurological: She is alert and oriented to person, place, and time. Coordination normal.  Psychiatric: She has a normal mood and affect. Her behavior is normal. Judgment and thought content normal.  Nursing note and vitals reviewed.   ED Course  Procedures (including critical care time) Labs Review Labs Reviewed  COMPREHENSIVE METABOLIC PANEL - Abnormal; Notable for the following:    Glucose, Bld 101 (*)    All other components within normal limits  URINALYSIS, ROUTINE W REFLEX MICROSCOPIC (NOT AT [facility]) - Abnormal; Notable for the following:    APPearance CLOUDY (*)    Leukocytes, UA TRACE (*)    All other components within normal limits  URINE MICROSCOPIC-ADD ON - Abnormal; Notable for the following:    Squamous Epithelial / LPF 6-30 (*)    Bacteria, UA MANY (*)    All other components within normal limits  URINE CULTURE  CBC WITH DIFFERENTIAL/PLATELET  LIPASE, BLOOD  URINE RAPID DRUG SCREEN, HOSP PERFORMED  POC URINE PREG, ED    Imaging Review Ct Abdomen Pelvis W Contrast  08/16/2015  CLINICAL DATA:  Abdominal pain, nausea and vomiting for 1 week, lower abdominal pain EXAM: CT ABDOMEN AND PELVIS WITH CONTRAST TECHNIQUE: Multidetector CT  imaging of the abdomen and pelvis was performed using the standard protocol following bolus administration of intravenous contrast. CONTRAST:  29mL OMNIPAQUE IOHEXOL 300 MG/ML SOLN, 127mL OMNIPAQUE IOHEXOL 300 MG/ML SOLN COMPARISON:  07/16/2014 FINDINGS: Sagittal images of the spine shows posterior metallic fusion at [id number] level. There are streak artifacts from patient's large body habitus. Enhanced liver is unremarkable. Partially fatty replaced pancreas. The spleen and adrenal glands are unremarkable. Enhanced kidneys are symmetrical in size. No hydronephrosis or hydroureter. No aortic aneurysm. No calcified gallstones are noted within gallbladder. Mild fatty infiltration of the liver. There is no gastric outlet obstruction. No small bowel obstruction. No ascites or free air. No adenopathy. Tiny umbilical hernia containing fat without evidence of acute complication. There is no pericecal inflammation. Normal appendix is partially visualized in axial image 60. The terminal ileum is unremarkable. The uterus is anteflexed. Question small midline anterior fundal fibroid measures 1.2 cm. There is a left ovarian cyst measures 2.7 by 2.7 cm. No pelvic free fluid is noted. The urinary bladder is under distended. There is no inguinal adenopathy. No destructive bony lesions are noted within pelvis. Delayed renal images shows bilateral renal symmetrical excretion. IMPRESSION: 1. There is minimal fatty infiltration of the liver. No focal hepatic mass. 2. No small bowel obstruction. 3. Normal appendix.  No pericecal inflammation. 4. No hydronephrosis or hydroureter. 5. A anteflexed uterus is noted. Question tiny midline anterior uterine fundal fibroid measures 1.2 cm. 6. There is a left ovarian cyst measures 2.7 x 2.7 cm. No pelvic free fluid. 7. Postsurgical changes posterior fusion lumbar spine at L5-S1 level. 8. Tiny umbilical hernia containing fat without evidence of acute complication. Electronically Signed   By: Lahoma Crocker M.D.   On: 08/16/2015 23:56   I have personally reviewed and evaluated these images and lab results as part of my medical decision-making.   EKG Interpretation None      MDM   Final diagnoses:  Nausea vomiting and diarrhea    Labs: CBC, CMP, lipase, point of care urine preg  Imaging: CT abdomen with contrast  Consults:  Therapeutics: Zofran, Ativan, Toradol, normal saline  Discharge Meds: Zofran, Imodium  Assessment/Plan:  40 year old female presents today with nausea vomiting and diarrhea. She has no signs on her laboratory analysis that indicate dehydration, she appears hydrated on exam. She has no electrolyte abnormalities, kidney function appears to be normal. I have low suspicion for significant pathology, but patient persisted abdominal pain is severe and requesting pain medication. Toradol given here, Zofran, patient requesting antianxiety medication, she was given it here. This patient's stay when on she became more restless, And leaving the room, and was seen putting hand sanitizer on her face. CT results showed no significant findings that would indicate significant acute life-threatening pathology, she will be discharged home with symptomatic care instructions primary care follow-up. She verbalized understanding and agreement with today's plan.         Okey Regal, PA-C 08/17/15 [id number]  Daleen Bo, MD 08/17/15 1314

## 2015-08-16 NOTE — ED Notes (Signed)
Nurse drawing labs. 

## 2015-08-16 NOTE — ED Notes (Addendum)
According to EMS, pt reports abd pain, n/v experienced x1 week. Pt arrives to [facility] ED A+OX4, speaking in complete sentences, appearing to be actively crying.

## 2015-08-16 NOTE — ED Notes (Signed)
Patient transported to CT 

## 2015-08-17 MED ORDER — ONDANSETRON HCL 4 MG PO TABS: 4.0000 mg | ORAL_TABLET | Freq: Four times a day (QID) | ORAL | Status: DC

## 2015-08-17 MED ORDER — LOPERAMIDE HCL 2 MG PO CAPS: 2.0000 mg | ORAL_CAPSULE | Freq: Four times a day (QID) | ORAL | Status: DC | PRN

## 2015-08-17 NOTE — ED Notes (Signed)
Pt standing in hallway waiting to leave.

## 2015-08-17 NOTE — Discharge Instructions (Signed)
Please follow-up with her primary care provider for reevaluation and further management. If any new or worsening signs or symptoms present please return immediately for further evaluation.

## 2015-08-18 LAB — URINE CULTURE: Special Requests: NORMAL

## 2015-08-20 ENCOUNTER — Other Ambulatory Visit: Payer: Self-pay

## 2015-08-20 NOTE — Telephone Encounter (Signed)
Pharm faxed req for RF of synthroid. Pt was given following copied message from Dr Laney Pastor on 05/20/15: "Missed f/u for thyroid testingafter January OV(in 2 months) She'll have to come in for visit plus labs to get any meds". Denied refill w/this message back to pharm.

## 2015-08-28 ENCOUNTER — Telehealth: Payer: Self-pay

## 2015-08-28 NOTE — Telephone Encounter (Signed)
Patient was recently seen in the hospital and diagnosed with a hernia. She needs a referal for [facility] as soon as possible. Patient states she has to have the referral before she can have surgery. Patient asked if this can be done as soon as possible because she's been throwing up and in sever pain. [phone]

## 2015-08-31 NOTE — Telephone Encounter (Signed)
Looks like not seen here in a year, then by Dr. Laney Pastor.  I do not think we can do referral unless evaluated here.

## 2015-09-02 NOTE — Telephone Encounter (Signed)
Spoke to patient she stated that she will come in for evaluation and that she wanted [place] to know that we have done an awesome job helping her in the past and she appreciates all we do for her!

## 2015-12-06 NOTE — Telephone Encounter (Signed)
close

## 2015-12-16 DIAGNOSIS — M1990 Unspecified osteoarthritis, unspecified site: Secondary | ICD-10-CM | POA: Insufficient documentation

## 2015-12-16 DIAGNOSIS — Z3202 Encounter for pregnancy test, result negative: Secondary | ICD-10-CM | POA: Diagnosis not present

## 2015-12-16 DIAGNOSIS — Z86018 Personal history of other benign neoplasm: Secondary | ICD-10-CM | POA: Insufficient documentation

## 2015-12-16 DIAGNOSIS — E039 Hypothyroidism, unspecified: Secondary | ICD-10-CM | POA: Diagnosis not present

## 2015-12-16 DIAGNOSIS — G4730 Sleep apnea, unspecified: Secondary | ICD-10-CM | POA: Diagnosis not present

## 2015-12-16 DIAGNOSIS — G8929 Other chronic pain: Secondary | ICD-10-CM | POA: Insufficient documentation

## 2015-12-16 DIAGNOSIS — R112 Nausea with vomiting, unspecified: Secondary | ICD-10-CM | POA: Diagnosis not present

## 2015-12-16 DIAGNOSIS — N83202 Unspecified ovarian cyst, left side: Secondary | ICD-10-CM | POA: Diagnosis not present

## 2015-12-16 DIAGNOSIS — F17210 Nicotine dependence, cigarettes, uncomplicated: Secondary | ICD-10-CM | POA: Diagnosis not present

## 2015-12-16 DIAGNOSIS — R109 Unspecified abdominal pain: Secondary | ICD-10-CM | POA: Diagnosis present

## 2015-12-16 DIAGNOSIS — Z79899 Other long term (current) drug therapy: Secondary | ICD-10-CM | POA: Insufficient documentation

## 2015-12-16 DIAGNOSIS — R197 Diarrhea, unspecified: Secondary | ICD-10-CM | POA: Insufficient documentation

## 2015-12-16 DIAGNOSIS — Z8719 Personal history of other diseases of the digestive system: Secondary | ICD-10-CM | POA: Insufficient documentation

## 2015-12-16 LAB — I-STAT BETA HCG BLOOD, ED (MC, WL, AP ONLY)

## 2015-12-16 LAB — COMPREHENSIVE METABOLIC PANEL
ALT: 15 U/L (ref 14–54)
ANION GAP: 6 (ref 5–15)
AST: 19 U/L (ref 15–41)
Albumin: 3.8 g/dL (ref 3.5–5.0)
Alkaline Phosphatase: 48 U/L (ref 38–126)
BILIRUBIN TOTAL: 0.4 mg/dL (ref 0.3–1.2)
BUN: 14 mg/dL (ref 6–20)
CALCIUM: 9.3 mg/dL (ref 8.9–10.3)
CO2: 26 mmol/L (ref 22–32)
CREATININE: 0.88 mg/dL (ref 0.44–1.00)
Chloride: 105 mmol/L (ref 101–111)
GFR calc Af Amer: >60 mL/min (ref >60)
Glucose, Bld: 101 mg/dL — ABNORMAL HIGH (ref 65–99)
POTASSIUM: 4.2 mmol/L (ref 3.5–5.1)
Sodium: 137 mmol/L (ref 135–145)
Total Protein: 6.7 g/dL (ref 6.5–8.1)

## 2015-12-16 LAB — CBC
HCT: 40.8 % (ref 36.0–46.0)
Hemoglobin: 13.6 g/dL (ref 12.0–15.0)
MCH: 30.2 pg (ref 26.0–34.0)
MCHC: 33.3 g/dL (ref 30.0–36.0)
MCV: 90.7 fL (ref 78.0–100.0)
PLATELETS: 159 10*3/uL (ref 150–400)
RBC: 4.5 MIL/uL (ref 3.87–5.11)
RDW: 13.1 % (ref 11.5–15.5)
WBC: 7.9 10*3/uL (ref 4.0–10.5)

## 2015-12-16 LAB — URINE MICROSCOPIC-ADD ON: RBC / HPF: NONE SEEN RBC/hpf (ref 0–5)

## 2015-12-16 LAB — URINALYSIS, ROUTINE W REFLEX MICROSCOPIC
BILIRUBIN URINE: NEGATIVE
GLUCOSE, UA: NEGATIVE mg/dL
HGB URINE DIPSTICK: NEGATIVE
Ketones, ur: NEGATIVE mg/dL
Nitrite: NEGATIVE
PROTEIN: NEGATIVE mg/dL
Specific Gravity, Urine: 1.024 (ref 1.005–1.030)
pH: 5 (ref 5.0–8.0)

## 2015-12-16 LAB — LIPASE, BLOOD: Lipase: 22 U/L (ref 11–51)

## 2015-12-16 MED ORDER — ONDANSETRON HCL 4 MG/2ML IJ SOLN
4.0000 mg | Freq: Once | INTRAMUSCULAR | Status: AC
  Administered 2015-12-16: 4 mg via INTRAVENOUS

## 2015-12-16 MED ORDER — IBUPROFEN 400 MG PO TABS
ORAL_TABLET | ORAL | Status: DC
Start: 2015-12-16 — End: 2015-12-17
  Filled 2015-12-16: qty 1

## 2015-12-16 MED ORDER — ONDANSETRON HCL 4 MG/2ML IJ SOLN
INTRAMUSCULAR | Status: AC
  Filled 2015-12-16: qty 2

## 2015-12-16 MED ORDER — IBUPROFEN 400 MG PO TABS
400.0000 mg | ORAL_TABLET | Freq: Once | ORAL | Status: AC | PRN
  Administered 2015-12-16: 400 mg via ORAL

## 2015-12-16 NOTE — ED Notes (Signed)
RN Shanon Brow called to see if pt can have pain meds

## 2015-12-16 NOTE — ED Notes (Signed)
Patient arrives with complaint of LLQ abdominal pain. History of ovarian cysts and PID. Patient reports that pain is similar to previous PID instances. Complaining of nausea with EMS, they gave 4mg  IV Zofran.

## 2015-12-17 ENCOUNTER — Emergency Department (HOSPITAL_COMMUNITY)
Admission: EM | Admit: 2015-12-17 | Discharge: 2015-12-17 | Disposition: A | Discharge status: Home / Self Care | Payer: Medicare HMO | Attending: Emergency Medicine | Admitting: Emergency Medicine

## 2015-12-17 ENCOUNTER — Emergency Department (HOSPITAL_COMMUNITY): Payer: Medicare HMO

## 2015-12-17 DIAGNOSIS — R197 Diarrhea, unspecified: Secondary | ICD-10-CM

## 2015-12-17 DIAGNOSIS — R112 Nausea with vomiting, unspecified: Secondary | ICD-10-CM

## 2015-12-17 DIAGNOSIS — N83202 Unspecified ovarian cyst, left side: Secondary | ICD-10-CM

## 2015-12-17 DIAGNOSIS — R1032 Left lower quadrant pain: Secondary | ICD-10-CM

## 2015-12-17 MED ORDER — CLONAZEPAM 0.5 MG PO TABS
2.0000 mg | ORAL_TABLET | Freq: Once | ORAL | Status: AC
  Administered 2015-12-17: 2 mg via ORAL
  Filled 2015-12-17: qty 4

## 2015-12-17 MED ORDER — HYDROMORPHONE HCL 1 MG/ML IJ SOLN
1.0000 mg | Freq: Once | INTRAMUSCULAR | Status: AC
  Administered 2015-12-17: 1 mg via INTRAVENOUS
  Filled 2015-12-17: qty 1

## 2015-12-17 MED ORDER — ONDANSETRON HCL 4 MG/2ML IJ SOLN
4.0000 mg | Freq: Once | INTRAMUSCULAR | Status: AC
  Administered 2015-12-17: 4 mg via INTRAVENOUS
  Filled 2015-12-17: qty 2

## 2015-12-17 MED ORDER — NAPROXEN 500 MG PO TABS: 500.0000 mg | ORAL_TABLET | Freq: Two times a day (BID) | ORAL | Status: DC

## 2015-12-17 MED ORDER — CYCLOBENZAPRINE HCL 10 MG PO TABS: 10.0000 mg | ORAL_TABLET | Freq: Two times a day (BID) | ORAL | Status: DC | PRN

## 2015-12-17 MED ORDER — ONDANSETRON 8 MG PO TBDP
8.0000 mg | ORAL_TABLET | Freq: Three times a day (TID) | ORAL | Status: DC | PRN
Start: 2015-12-17 — End: 2017-04-23

## 2015-12-17 MED ORDER — KETOROLAC TROMETHAMINE 30 MG/ML IJ SOLN
30.0000 mg | Freq: Once | INTRAMUSCULAR | Status: DC
Start: 2015-12-17 — End: 2015-12-17

## 2015-12-17 NOTE — ED Notes (Signed)
Pt asked this RN to step in room. States that she takes Subutex so she will need more than just the 1mg  Dilaudid that was ordered. EDP aware. EDP states he has already discussed with pt that we will only be giving 1mg  Dilaudid.

## 2015-12-17 NOTE — ED Provider Notes (Signed)
CSN: [id number]     Arrival date & time 12/16/15  1958 History   First MD Initiated Contact with Patient 12/17/15 [phone]     Chief Complaint  Patient presents with  . Abdominal Pain     (Consider location/radiation/quality/duration/timing/severity/associated sxs/prior Treatment) HPI Comments: Pt comes in with cc of LLQ abd pain. Hx of PCOS. Recently treated for PID and BV, finished antibiotics 3 days ago. PT reports that she started having new pain 36 hours ago. The pain is LLQ and radiates to the back. She started having hematuria while in the waiting room. + nausea x 1 month - but worse now. No meds at home for nausea right now.  Pt reports emesis x 12 in the last 24 hours. Also has loose BM x 12 times, mucus +.  Pt has had abd pain x 1 month, despite the treatment it has persisted. Pain is in the lower quadrant, and it is constant.   ROS 10 Systems reviewed and are negative for acute change except as noted in the HPI.      Patient is a 40 y.o. female presenting with abdominal pain. The history is provided by the patient.  Abdominal Pain   Past Medical History  Diagnosis Date  . Thyroid disease   . Chronic back pain   . Hypothyroidism   . Anxiety   . GERD (gastroesophageal reflux disease)   . Sleep apnea     cpap  . Headache(784.0)   . Neuromuscular disorder (HCC)     neuropathy  . Arthritis     knees bilateral   DJD  stenosis   . Blood dyscrasia     HSV  . PCOS (polycystic ovarian syndrome)   . Cancer ([place])     vaginal  malignant carcinoma  . Alcohol abuse   . Drug-seeking behavior   . Polysubstance abuse    Past Surgical History  Procedure Laterality Date  . Back surgery    . Cesarean section    . Tonsillectomy    . Tubal ligation    . Fracture surgery     Family History  Problem Relation Age of Onset  . Arthritis Mother   . Clotting disorder Mother   . Hyperlipidemia Father   . Leukemia Maternal Grandmother   . Diabetes Maternal Grandfather   .  Stroke Paternal Grandmother   . Heart disease Father   . Diabetes Mother   . Hypertension Mother   . Prostate cancer Father   . Heart disease Paternal Grandmother    Social History  Substance Use Topics  . Smoking status: Current Every Rizzi Smoker -- 1.00 packs/[place] for 20 years    Types: Cigarettes  . Smokeless tobacco: Never Used  . Alcohol Use: No     Comment: denies alcohol since 12/16   OB History    Gravida Para Term Preterm AB TAB SAB Ectopic Multiple Living   1 1 1             Review of Systems  Gastrointestinal: Positive for abdominal pain.      Allergies  Moxifloxacin and Quinolones  Home Medications   Prior to Admission medications   Medication Sig Start Date End Date Taking? Authorizing Provider  buprenorphine (SUBUTEX) 8 MG SUBL SL tablet Place under the tongue 3 (three) times daily.     Historical Provider, MD  clonazePAM (KLONOPIN) 1 MG tablet Take 1 mg by mouth 3 (three) times daily as needed for anxiety.    Historical Provider, MD  gabapentin (NEURONTIN) 300 MG capsule Take 900 mg by mouth 3 (three) times daily.    Historical Provider, MD  HYDROcodone-acetaminophen (NORCO/VICODIN) 5-325 MG per tablet Take 1-2 tablets by mouth every 6 (six) hours as needed for moderate pain or severe pain. Patient not taking: Reported on 08/23/2014 07/31/14   Tatyana Kirichenko, PA-C  HYDROmorphone (DILAUDID) 2 MG tablet Take 1 tablet (2 mg total) by mouth every 12 (twelve) hours as needed for severe pain. Patient not taking: Reported on 08/16/2014 07/18/14   Flonnie Overman Dhungel, MD  hydrOXYzine (ATARAX/VISTARIL) 50 MG tablet Take 1 tablet (50 mg total) by mouth every 6 (six) hours as needed for nausea or vomiting. Patient not taking: Reported on 08/16/2015 08/25/14   Leandrew Koyanagi, MD  levothyroxine (SYNTHROID, LEVOTHROID) 50 MCG tablet TAKE ONE TABLET BY MOUTH ONCE DAILY BEFORE  BREAKFAST Patient not taking: Reported on 04/21/2015 06/27/14   Benjamine Mola, FNP  levothyroxine  (SYNTHROID, LEVOTHROID) 75 MCG tablet Take 1 tablet (75 mcg total) by mouth daily before breakfast. 08/25/14   Leandrew Koyanagi, MD  loperamide (IMODIUM) 2 MG capsule Take 1 capsule (2 mg total) by mouth 4 (four) times daily as needed for diarrhea or loose stools. 08/17/15   Okey Regal, PA-C  methocarbamol (ROBAXIN) 500 MG tablet Take 1 tablet (500 mg total) by mouth every 8 (eight) hours as needed for muscle spasms. Patient not taking: Reported on 08/20/2014 06/27/14   Benjamine Mola, FNP  mirtazapine (REMERON) 30 MG tablet Take as directed Patient not taking: Reported on 08/16/2015 09/15/14   Leandrew Koyanagi, MD  naproxen (NAPROSYN) 500 MG tablet Take 1 tablet (500 mg total) by mouth 2 (two) times daily. 12/17/15   Varney Biles, MD  ondansetron (ZOFRAN) 4 MG tablet Take 1 tablet (4 mg total) by mouth every 6 (six) hours. 08/17/15   Okey Regal, PA-C  oxyCODONE-acetaminophen (ROXICET) 5-325 MG per tablet Take 2 tablets by mouth every 6 (six) hours as needed for severe pain. Patient not taking: Reported on 08/23/2014 07/18/14   Nishant Dhungel, MD  PARoxetine (PAXIL) 40 MG tablet Take 1 tablet (40 mg total) by mouth daily. Patient not taking: Reported on 08/16/2015 09/15/14   Leandrew Koyanagi, MD  promethazine (PHENERGAN) 25 MG tablet Take 1 tablet (25 mg total) by mouth every 6 (six) hours as needed for nausea or vomiting. Patient not taking: Reported on 08/20/2014 07/31/14   Tatyana Kirichenko, PA-C  sucralfate (CARAFATE) 1 G tablet Take 1 tablet (1 g total) by mouth 4 (four) times daily -  with meals and at bedtime. Patient not taking: Reported on 08/25/2014 08/23/14   Starlyn Skeans, PA-C  tiZANidine (ZANAFLEX) 4 MG capsule TK ONE C PO TID 03/22/15   Historical Provider, MD  traMADol (ULTRAM) 50 MG tablet Take 2 tablets (100 mg total) by mouth every 6 (six) hours as needed. Patient not taking: Reported on 08/16/2015 08/25/14   Leandrew Koyanagi, MD  zolpidem (AMBIEN) 10 MG tablet Take 10 mg  by mouth at bedtime as needed for sleep.    Historical Provider, MD   BP 117/62 mmHg  Pulse 71  Temp(Src) 97.8 F (36.6 C) (Oral)  Resp 11  Wt 290 lb (131.543 kg)  SpO2 96% Physical Exam  Constitutional: She is oriented to person, place, and time. She appears well-developed.  HENT:  Head: Normocephalic and atraumatic.  Eyes: EOM are normal.  Neck: Normal range of motion. Neck supple.  Cardiovascular: Normal rate.   Pulmonary/Chest: Effort normal.  Abdominal: Bowel sounds are normal. She exhibits no distension. There is tenderness. There is guarding.  LLQ focal tenderness  Neurological: She is alert and oriented to person, place, and time.  Skin: Skin is warm and dry.  Nursing note and vitals reviewed.   ED Course  Procedures (including critical care time) Labs Review Labs Reviewed  COMPREHENSIVE METABOLIC PANEL - Abnormal; Notable for the following:    Glucose, Bld 101 (*)    All other components within normal limits  URINALYSIS, ROUTINE W REFLEX MICROSCOPIC (NOT AT [facility]) - Abnormal; Notable for the following:    APPearance HAZY (*)    Leukocytes, UA MODERATE (*)    All other components within normal limits  URINE MICROSCOPIC-ADD ON - Abnormal; Notable for the following:    Squamous Epithelial / LPF 6-30 (*)    Bacteria, UA FEW (*)    All other components within normal limits  GASTROINTESTINAL PANEL BY PCR, STOOL (REPLACES STOOL CULTURE)  URINE CULTURE  LIPASE, BLOOD  CBC  I-STAT BETA HCG BLOOD, ED ([facility], [facility], [facility] ONLY)    Imaging Review US Transvaginal Non-ob  12/17/2015  CLINICAL DATA:  Left lower quadrant pain. EXAM: TRANSABDOMINAL AND TRANSVAGINAL ULTRASOUND OF PELVIS TECHNIQUE: Both transabdominal and transvaginal ultrasound examinations of the pelvis were performed. Transabdominal technique was performed for global imaging of the pelvis including uterus, ovaries, adnexal regions, and pelvic cul-de-sac. It was necessary to proceed with endovaginal exam following the  transabdominal exam to visualize the right and left ovaries. COMPARISON:  CT 08/16/2015 FINDINGS: Uterus Measurements: 7.1 x 3.8 x 4.8 cm. No fibroids or other mass visualized. Endometrium Thickness: 7 mm.  No focal abnormality visualized. Right ovary Measurements: 1.9 x 1.7 x 2.4 cm. Normal appearance/no adnexal mass. Blood flow seen. Left ovary Measurements: 4.0 x 2.2 x 2.1 cm. Complex cyst measures 2.4 x 1.8 x 1.9 cm with both simple fluid and heterogeneous more echogenic components, possible clot retraction. Blood flow to the adjacent ovarian parenchyma. No blood flow to the more echogenic or fluid components. Other findings No abnormal free fluid. IMPRESSION: 1. Complex left ovarian cyst measures 2.4 cm. This has the appearance of a hemorrhagic cyst, with possible clot retraction. Given history of left lower quadrant pain, recommend follow-up to ensure resolution. Short-interval follow up ultrasound in 6-12 weeks is recommended, preferably during the week following the patient's normal menses. 2. Normal appearances the right ovary and uterus. Electronically Signed   By: Jeb Levering M.D.   On: 12/17/2015 05:52   US Pelvis Complete  12/17/2015  CLINICAL DATA:  Left lower quadrant pain. EXAM: TRANSABDOMINAL AND TRANSVAGINAL ULTRASOUND OF PELVIS TECHNIQUE: Both transabdominal and transvaginal ultrasound examinations of the pelvis were performed. Transabdominal technique was performed for global imaging of the pelvis including uterus, ovaries, adnexal regions, and pelvic cul-de-sac. It was necessary to proceed with endovaginal exam following the transabdominal exam to visualize the right and left ovaries. COMPARISON:  CT 08/16/2015 FINDINGS: Uterus Measurements: 7.1 x 3.8 x 4.8 cm. No fibroids or other mass visualized. Endometrium Thickness: 7 mm.  No focal abnormality visualized. Right ovary Measurements: 1.9 x 1.7 x 2.4 cm. Normal appearance/no adnexal mass. Blood flow seen. Left ovary Measurements: 4.0 x  2.2 x 2.1 cm. Complex cyst measures 2.4 x 1.8 x 1.9 cm with both simple fluid and heterogeneous more echogenic components, possible clot retraction. Blood flow to the adjacent ovarian parenchyma. No blood flow to the more echogenic or fluid components. Other findings No abnormal free fluid. IMPRESSION: 1. Complex  left ovarian cyst measures 2.4 cm. This has the appearance of a hemorrhagic cyst, with possible clot retraction. Given history of left lower quadrant pain, recommend follow-up to ensure resolution. Short-interval follow up ultrasound in 6-12 weeks is recommended, preferably during the week following the patient's normal menses. 2. Normal appearances the right ovary and uterus. Electronically Signed   By: Jeb Levering M.D.   On: 12/17/2015 05:52   I have personally reviewed and evaluated these images and lab results as part of my medical decision-making.   EKG Interpretation None      MDM   Final diagnoses:  Cyst of left ovary  Nausea vomiting and diarrhea    Pt comes in with LLQ abd pain. CT scan in Jan, 2017 - neg for diverticular disease. She has recently been treated for PID and BV - finished antibiotics just 3 days ago. Abd exam - focal LLQ tenderness, reproducible, no peritoneal signs. Labs are normal. No fevers. No signs of uti, dehydration. She reports diarrhea, and with recent antibiotics, stool studies ordered. She has PCOS - ovarian cyst is possible. Torsion considered in the ddx - however, pain is reproducible, constant for several hours now and with completely normal labs, we dont think she has torsion. Will get pelvic US - in the setting or PCOS, evaluate for cyst. Will get torsion study only if the cyst is large. Also, with hx of PID, will ensure there is no TOA - again, the pretest probability is low.  7:02 AM Results discussed. Pt made aware of the [place] results. Gyne f/u recommended. Strict ER return precautions have been discussed, and patient is agreeing with the  plan and is comfortable with the workup done and the recommendations from the ER.      Varney Biles, MD 12/17/15 [phone]

## 2015-12-17 NOTE — Discharge Instructions (Signed)
ER results show normal labs. Ultrasound shows cyst on the left side, but it is not large. Repeat follow up with gynecologist recommended in 2 weeks. The workup in the ER is not complete, and is limited to screening for life threatening and emergent conditions only, so please see a primary care doctor for further evaluation.  Ovarian Cyst An ovarian cyst is a fluid-filled sac that forms on an ovary. The ovaries are small organs that produce eggs in women. Various types of cysts can form on the ovaries. Most are not cancerous. Many do not cause problems, and they often go away on their own. Some may cause symptoms and require treatment. Common types of ovarian cysts include:  Functional cysts--These cysts may occur every month during the menstrual cycle. This is normal. The cysts usually go away with the next menstrual cycle if the woman does not get pregnant. Usually, there are no symptoms with a functional cyst.  Endometrioma cysts--These cysts form from the tissue that lines the uterus. They are also called "chocolate cysts" because they become filled with blood that turns brown. This type of cyst can cause pain in the lower abdomen during intercourse and with your menstrual period.  Cystadenoma cysts--This type develops from the cells on the outside of the ovary. These cysts can get very big and cause lower abdomen pain and pain with intercourse. This type of cyst can twist on itself, cut off its blood supply, and cause severe pain. It can also easily rupture and cause a lot of pain.  Dermoid cysts--This type of cyst is sometimes found in both ovaries. These cysts may contain different kinds of body tissue, such as skin, teeth, hair, or cartilage. They usually do not cause symptoms unless they get very big.  Theca lutein cysts--These cysts occur when too much of a certain hormone (human chorionic gonadotropin) is produced and overstimulates the ovaries to produce an egg. This is most common after  procedures used to assist with the conception of a baby (in vitro fertilization). CAUSES   Fertility drugs can cause a condition in which multiple large cysts are formed on the ovaries. This is called ovarian hyperstimulation syndrome.  A condition called polycystic ovary syndrome can cause hormonal imbalances that can lead to nonfunctional ovarian cysts. SIGNS AND SYMPTOMS  Many ovarian cysts do not cause symptoms. If symptoms are present, they may include:  Pelvic pain or pressure.  Pain in the lower abdomen.  Pain during sexual intercourse.  Increasing girth (swelling) of the abdomen.  Abnormal menstrual periods.  Increasing pain with menstrual periods.  Stopping having menstrual periods without being pregnant. DIAGNOSIS  These cysts are commonly found during a routine or annual pelvic exam. Tests may be ordered to find out more about the cyst. These tests may include:  Ultrasound.  X-ray of the pelvis.  CT scan.  MRI.  Blood tests. TREATMENT  Many ovarian cysts go away on their own without treatment. Your health care provider may want to check your cyst regularly for 2-3 months to see if it changes. For women in menopause, it is particularly important to monitor a cyst closely because of the higher rate of ovarian cancer in menopausal women. When treatment is needed, it may include any of the following:  A procedure to drain the cyst (aspiration). This may be done using a long needle and ultrasound. It can also be done through a laparoscopic procedure. This involves using a thin, lighted tube with a tiny camera on the end (  laparoscope) inserted through a small incision.  Surgery to remove the whole cyst. This may be done using laparoscopic surgery or an open surgery involving a larger incision in the lower abdomen.  Hormone treatment or birth control pills. These methods are sometimes used to help dissolve a cyst. HOME CARE INSTRUCTIONS   Only take over-the-counter or  prescription medicines as directed by your health care provider.  Follow up with your health care provider as directed.  Get regular pelvic exams and Pap tests. SEEK MEDICAL CARE IF:   Your periods are late, irregular, or painful, or they stop.  Your pelvic pain or abdominal pain does not go away.  Your abdomen becomes larger or swollen.  You have pressure on your bladder or trouble emptying your bladder completely.  You have pain during sexual intercourse.  You have feelings of fullness, pressure, or discomfort in your stomach.  You lose weight for no apparent reason.  You feel generally ill.  You become constipated.  You lose your appetite.  You develop acne.  You have an increase in body and facial hair.  You are gaining weight, without changing your exercise and eating habits.  You think you are pregnant. SEEK IMMEDIATE MEDICAL CARE IF:   You have increasing abdominal pain.  You feel sick to your stomach (nauseous), and you throw up (vomit).  You develop a fever that comes on suddenly.  You have abdominal pain during a bowel movement.  Your menstrual periods become heavier than usual. MAKE SURE YOU:  Understand these instructions.  Will watch your condition.  Will get help right away if you are not doing well or get worse.   This information is not intended to replace advice given to you by your health care provider. Make sure you discuss any questions you have with your health care provider.   Document Released: 07/13/2005 Document Revised: 07/18/2013 Document Reviewed: 03/20/2013 Elsevier Interactive Patient Education [employer].

## 2015-12-17 NOTE — ED Notes (Signed)
Pt called out asking for more Dilaudid again. EDP standing right beside this RN. Told pt once again that no more narcotics would be given.

## 2015-12-18 LAB — URINE CULTURE

## 2017-04-22 ENCOUNTER — Other Ambulatory Visit: Payer: Self-pay | Admitting: Obstetrics and Gynecology

## 2017-04-22 ENCOUNTER — Encounter (HOSPITAL_COMMUNITY): Payer: Self-pay | Admitting: *Deleted

## 2017-04-26 NOTE — Patient Instructions (Addendum)
Your procedure is scheduled on:  Wednesday, Oct. 3, 2018  Enter through the [employer] of [facility] at:  12:30PM  Pick up the phone at the desk and dial [phone].  Call this number if you have problems the morning of surgery: [phone].  Remember: Do NOT eat food:  After Midnight Tuesday  Do NOT drink clear liquids after:  8:00 AM Wednesday  Take these medicines the morning of surgery with a SIP OF WATER:  Clonazepam, Duloxetine, Gabapentin, Levothyroxine  Bring CPAP machine [place] of surgery  Stop ALL herbal medications at this time  Do NOT smoke the [place] of surgery.  Do NOT wear jewelry (body piercing), metal hair clips/bobby pins, make-up, artifical eyelashes or nail polish. Do NOT wear lotions, powders, or perfumes.  You may wear deodorant. Do NOT shave for 48 hours prior to surgery. Do NOT bring valuables to the hospital. Contacts, dentures, or bridgework may not be worn into surgery.  Have a responsible adult drive you home and stay with you for 24 hours after your procedure  Bring a copy of your healthcare power of attorney and living will documents.

## 2017-04-27 ENCOUNTER — Encounter (HOSPITAL_COMMUNITY): Payer: Self-pay

## 2017-04-27 ENCOUNTER — Encounter (HOSPITAL_COMMUNITY)
Admission: RE | Admit: 2017-04-27 | Discharge: 2017-04-27 | Disposition: A | Discharge status: Home / Self Care | Payer: Medicare HMO | Source: Ambulatory Visit | Attending: Obstetrics and Gynecology | Admitting: Obstetrics and Gynecology

## 2017-04-27 DIAGNOSIS — Z79899 Other long term (current) drug therapy: Secondary | ICD-10-CM | POA: Diagnosis not present

## 2017-04-27 DIAGNOSIS — N939 Abnormal uterine and vaginal bleeding, unspecified: Secondary | ICD-10-CM | POA: Diagnosis not present

## 2017-04-27 DIAGNOSIS — Z6841 Body Mass Index (BMI) 40.0 and over, adult: Secondary | ICD-10-CM | POA: Diagnosis not present

## 2017-04-27 DIAGNOSIS — G4730 Sleep apnea, unspecified: Secondary | ICD-10-CM | POA: Diagnosis not present

## 2017-04-27 DIAGNOSIS — F419 Anxiety disorder, unspecified: Secondary | ICD-10-CM | POA: Diagnosis not present

## 2017-04-27 DIAGNOSIS — F329 Major depressive disorder, single episode, unspecified: Secondary | ICD-10-CM | POA: Diagnosis not present

## 2017-04-27 DIAGNOSIS — F17200 Nicotine dependence, unspecified, uncomplicated: Secondary | ICD-10-CM | POA: Diagnosis not present

## 2017-04-27 DIAGNOSIS — E039 Hypothyroidism, unspecified: Secondary | ICD-10-CM | POA: Diagnosis not present

## 2017-04-27 HISTORY — DX: Other intervertebral disc displacement, lumbar region: M51.26

## 2017-04-27 HISTORY — DX: Endometriosis, unspecified: N80.9

## 2017-04-27 HISTORY — DX: Fibromyalgia: M79.7

## 2017-04-27 HISTORY — DX: Benign neoplasm of connective and other soft tissue, unspecified: D21.9

## 2017-04-27 HISTORY — DX: Other specified postprocedural states: Z98.890

## 2017-04-27 HISTORY — DX: Nausea with vomiting, unspecified: R11.2

## 2017-04-27 HISTORY — DX: Anemia, unspecified: D64.9

## 2017-04-27 HISTORY — DX: Unspecified fracture of unspecified foot, initial encounter for closed fracture: S92.909A

## 2017-04-27 HISTORY — DX: Other complications of anesthesia, initial encounter: T88.59XA

## 2017-04-27 HISTORY — DX: Adverse effect of unspecified anesthetic, initial encounter: T41.45XA

## 2017-04-27 LAB — BASIC METABOLIC PANEL
Anion gap: 7 (ref 5–15)
BUN: 10 mg/dL (ref 6–20)
CHLORIDE: 106 mmol/L (ref 101–111)
CO2: 22 mmol/L (ref 22–32)
Calcium: 8.5 mg/dL — ABNORMAL LOW (ref 8.9–10.3)
Creatinine, Ser: 0.98 mg/dL (ref 0.44–1.00)
GFR calc Af Amer: >60 mL/min (ref >60)
GLUCOSE: 103 mg/dL — AB (ref 65–99)
POTASSIUM: 4 mmol/L (ref 3.5–5.1)
Sodium: 135 mmol/L (ref 135–145)

## 2017-04-27 LAB — CBC
HEMATOCRIT: 33.7 % — AB (ref 36.0–46.0)
HEMOGLOBIN: 11.1 g/dL — AB (ref 12.0–15.0)
MCH: 28.8 pg (ref 26.0–34.0)
MCHC: 32.9 g/dL (ref 30.0–36.0)
MCV: 87.3 fL (ref 78.0–100.0)
Platelets: 270 10*3/uL (ref 150–400)
RBC: 3.86 MIL/uL — ABNORMAL LOW (ref 3.87–5.11)
RDW: 14.3 % (ref 11.5–15.5)
WBC: 9.6 10*3/uL (ref 4.0–10.5)

## 2017-04-28 ENCOUNTER — Encounter (HOSPITAL_COMMUNITY): Payer: Self-pay | Admitting: *Deleted

## 2017-04-28 ENCOUNTER — Ambulatory Visit (HOSPITAL_COMMUNITY): Payer: Medicare HMO | Admitting: Anesthesiology

## 2017-04-28 ENCOUNTER — Encounter (HOSPITAL_COMMUNITY)
Admission: AD | Disposition: A | Discharge status: Home / Self Care | Payer: Self-pay | Source: Ambulatory Visit | Attending: Obstetrics and Gynecology

## 2017-04-28 ENCOUNTER — Ambulatory Visit (HOSPITAL_COMMUNITY)
Admission: AD | Admit: 2017-04-28 | Discharge: 2017-04-28 | Disposition: A | Discharge status: Home / Self Care | Payer: Medicare HMO | Source: Ambulatory Visit | Attending: Obstetrics and Gynecology | Admitting: Obstetrics and Gynecology

## 2017-04-28 DIAGNOSIS — Z6841 Body Mass Index (BMI) 40.0 and over, adult: Secondary | ICD-10-CM | POA: Insufficient documentation

## 2017-04-28 DIAGNOSIS — G4730 Sleep apnea, unspecified: Secondary | ICD-10-CM | POA: Insufficient documentation

## 2017-04-28 DIAGNOSIS — F419 Anxiety disorder, unspecified: Secondary | ICD-10-CM | POA: Insufficient documentation

## 2017-04-28 DIAGNOSIS — F17200 Nicotine dependence, unspecified, uncomplicated: Secondary | ICD-10-CM | POA: Insufficient documentation

## 2017-04-28 DIAGNOSIS — N939 Abnormal uterine and vaginal bleeding, unspecified: Secondary | ICD-10-CM | POA: Diagnosis not present

## 2017-04-28 DIAGNOSIS — E039 Hypothyroidism, unspecified: Secondary | ICD-10-CM | POA: Insufficient documentation

## 2017-04-28 DIAGNOSIS — F329 Major depressive disorder, single episode, unspecified: Secondary | ICD-10-CM | POA: Insufficient documentation

## 2017-04-28 DIAGNOSIS — Z79899 Other long term (current) drug therapy: Secondary | ICD-10-CM | POA: Insufficient documentation

## 2017-04-28 HISTORY — PX: DILITATION & CURRETTAGE/HYSTROSCOPY WITH NOVASURE ABLATION: SHX5568

## 2017-04-28 LAB — HCG, SERUM, QUALITATIVE: PREG SERUM: NEGATIVE

## 2017-04-28 SURGERY — DILATATION & CURETTAGE/HYSTEROSCOPY WITH NOVASURE ABLATION
Anesthesia: General

## 2017-04-28 MED ORDER — LIDOCAINE HCL (CARDIAC) 20 MG/ML IV SOLN
INTRAVENOUS | Status: AC
  Filled 2017-04-28: qty 5

## 2017-04-28 MED ORDER — SCOPOLAMINE 1 MG/3DAYS TD PT72
1.0000 | MEDICATED_PATCH | Freq: Once | TRANSDERMAL | Status: DC
  Administered 2017-04-28: 1.5 mg via TRANSDERMAL

## 2017-04-28 MED ORDER — MIDAZOLAM HCL 2 MG/2ML IJ SOLN
INTRAMUSCULAR | Status: AC
  Filled 2017-04-28: qty 2

## 2017-04-28 MED ORDER — FENTANYL CITRATE (PF) 100 MCG/2ML IJ SOLN: 25.0000 ug | INTRAMUSCULAR | Status: DC | PRN

## 2017-04-28 MED ORDER — KETOROLAC TROMETHAMINE 30 MG/ML IJ SOLN
INTRAMUSCULAR | Status: AC
  Filled 2017-04-28: qty 1

## 2017-04-28 MED ORDER — HYDROMORPHONE HCL 1 MG/ML IJ SOLN
INTRAMUSCULAR | Status: AC
  Filled 2017-04-28: qty 1

## 2017-04-28 MED ORDER — SCOPOLAMINE 1 MG/3DAYS TD PT72
MEDICATED_PATCH | TRANSDERMAL | Status: AC
  Filled 2017-04-28: qty 1

## 2017-04-28 MED ORDER — SUCCINYLCHOLINE CHLORIDE 20 MG/ML IJ SOLN
INTRAMUSCULAR | Status: DC | PRN
  Administered 2017-04-28: 120 mg via INTRAVENOUS

## 2017-04-28 MED ORDER — FENTANYL CITRATE (PF) 100 MCG/2ML IJ SOLN
INTRAMUSCULAR | Status: DC | PRN
  Administered 2017-04-28 (×2): 50 ug via INTRAVENOUS

## 2017-04-28 MED ORDER — FENTANYL CITRATE (PF) 100 MCG/2ML IJ SOLN
INTRAMUSCULAR | Status: AC
  Filled 2017-04-28: qty 2

## 2017-04-28 MED ORDER — LACTATED RINGERS IR SOLN
Status: DC | PRN
  Administered 2017-04-28: 3000 mL

## 2017-04-28 MED ORDER — LIDOCAINE HCL (CARDIAC) 20 MG/ML IV SOLN
INTRAVENOUS | Status: DC | PRN
  Administered 2017-04-28: 50 mg via INTRAVENOUS

## 2017-04-28 MED ORDER — ONDANSETRON HCL 4 MG/2ML IJ SOLN
INTRAMUSCULAR | Status: AC
  Filled 2017-04-28: qty 2

## 2017-04-28 MED ORDER — HYDROMORPHONE HCL 1 MG/ML IJ SOLN
0.5000 mg | INTRAMUSCULAR | Status: AC | PRN
  Administered 2017-04-28 (×4): 0.5 mg via INTRAVENOUS

## 2017-04-28 MED ORDER — KETOROLAC TROMETHAMINE 30 MG/ML IJ SOLN
INTRAMUSCULAR | Status: DC | PRN
  Administered 2017-04-28: 30 mg via INTRAVENOUS

## 2017-04-28 MED ORDER — ONDANSETRON HCL 4 MG/2ML IJ SOLN: 4.0000 mg | Freq: Once | INTRAMUSCULAR | Status: DC | PRN

## 2017-04-28 MED ORDER — PROPOFOL 10 MG/ML IV BOLUS
INTRAVENOUS | Status: AC
  Filled 2017-04-28: qty 20

## 2017-04-28 MED ORDER — MIDAZOLAM HCL 2 MG/2ML IJ SOLN
INTRAMUSCULAR | Status: DC | PRN
  Administered 2017-04-28: 2 mg via INTRAVENOUS

## 2017-04-28 MED ORDER — BUPIVACAINE HCL (PF) 0.25 % IJ SOLN
INTRAMUSCULAR | Status: AC
  Filled 2017-04-28: qty 30

## 2017-04-28 MED ORDER — OXYCODONE-ACETAMINOPHEN 5-325 MG PO TABS: 1.0000 | ORAL_TABLET | ORAL | 0 refills | Status: DC | PRN

## 2017-04-28 MED ORDER — DEXAMETHASONE SODIUM PHOSPHATE 4 MG/ML IJ SOLN
INTRAMUSCULAR | Status: AC
  Filled 2017-04-28: qty 1

## 2017-04-28 MED ORDER — DEXAMETHASONE SODIUM PHOSPHATE 10 MG/ML IJ SOLN
INTRAMUSCULAR | Status: DC | PRN
  Administered 2017-04-28: 4 mg via INTRAVENOUS

## 2017-04-28 MED ORDER — PROPOFOL 10 MG/ML IV BOLUS
INTRAVENOUS | Status: DC | PRN
  Administered 2017-04-28: 200 mg via INTRAVENOUS

## 2017-04-28 MED ORDER — ONDANSETRON HCL 4 MG/2ML IJ SOLN
INTRAMUSCULAR | Status: DC | PRN
  Administered 2017-04-28: 4 mg via INTRAVENOUS

## 2017-04-28 MED ORDER — LACTATED RINGERS IV SOLN
INTRAVENOUS | Status: DC
  Administered 2017-04-28 (×2): 1000 mL via INTRAVENOUS

## 2017-04-28 MED ORDER — BUPIVACAINE HCL (PF) 0.25 % IJ SOLN
INTRAMUSCULAR | Status: DC | PRN
  Administered 2017-04-28: 20 mL

## 2017-04-28 MED ORDER — DEXTROSE 5 % IV SOLN
3.0000 g | INTRAVENOUS | Status: AC
  Administered 2017-04-28: 3 g via INTRAVENOUS
  Filled 2017-04-28: qty 3000

## 2017-04-28 MED ORDER — HYDROMORPHONE HCL 1 MG/ML IJ SOLN
1.0000 mg | Freq: Once | INTRAMUSCULAR | Status: AC
  Administered 2017-04-28: 1 mg via INTRAVENOUS

## 2017-04-28 SURGICAL SUPPLY — 16 items
ABLATOR ENDOMETRIAL BIPOLAR (ABLATOR) ×3 IMPLANT
CANISTER SUCT 3000ML PPV (MISCELLANEOUS) ×3 IMPLANT
CATH ROBINSON RED A/P 16FR (CATHETERS) ×3 IMPLANT
CLOTH BEACON ORANGE TIMEOUT ST (SAFETY) ×3 IMPLANT
CONTAINER PREFILL 10% NBF 60ML (FORM) ×6 IMPLANT
GLOVE BIO SURGEON STRL SZ7.5 (GLOVE) ×3 IMPLANT
GLOVE BIOGEL PI IND STRL 7.0 (GLOVE) ×1 IMPLANT
GLOVE BIOGEL PI INDICATOR 7.0 (GLOVE) ×2
GOWN STRL REUS W/TWL LRG LVL3 (GOWN DISPOSABLE) ×6 IMPLANT
PACK VAGINAL MINOR WOMEN LF (CUSTOM PROCEDURE TRAY) ×3 IMPLANT
PAD OB MATERNITY 4.3X12.25 (PERSONAL CARE ITEMS) ×3 IMPLANT
PAD PREP 24X48 CUFFED NSTRL (MISCELLANEOUS) ×3 IMPLANT
SYR TB 1ML 25GX5/8 (SYRINGE) ×3 IMPLANT
TOWEL OR 17X24 6PK STRL BLUE (TOWEL DISPOSABLE) ×6 IMPLANT
TUBING AQUILEX INFLOW (TUBING) ×3 IMPLANT
TUBING AQUILEX OUTFLOW (TUBING) ×3 IMPLANT

## 2017-04-28 NOTE — Op Note (Signed)
[name], STREAM                ACCOUNT NO.:  [id number]  MEDICAL RECORD NO.:  [id number]  LOCATION:  [facility]                          FACILITY:  [facility]  PHYSICIAN:  Lovenia Kim, M.D.DATE OF BIRTH:  November 06, 1975  DATE OF PROCEDURE: DATE OF DISCHARGE:  04/28/2017                              OPERATIVE REPORT   PREOPERATIVE DIAGNOSIS:  Menometrorrhagia with secondary anemia.  POSTOPERATIVE DIAGNOSES: 1. Menometrorrhagia with secondary anemia. 2. Endometrial polyp.  PROCEDURES: 1. Diagnostic hysteroscopy. 2. D and C. 3. Endometrial polypectomy. 4. NovaSure endometrial ablation.  SURGEON:  Lovenia Kim, M.D.  ASSISTANT:  None.  ANESTHESIA:  Local, general.  ESTIMATED BLOOD LOSS:  Less than 50 mL.  FLUID DEFICIT:  50 mL.  COMPLICATIONS:  None.  DRAINS:  None.  COUNTS:  Correct.  SPECIMEN:  Endometrial curettings and polypoid fragments to Pathology.  DISPOSITION:  The patient to recovery in good condition.  BRIEF OPERATIVE NOTE:  After being apprised of the risks of anesthesia, infection, bleeding, injury to surrounding organs, possible need for repair, delayed versus immediate complications to include bowel and bladder injury, possible need for repair, the patient was brought to the operating room where she was administered general anesthetic without complications.  Prepped and draped in usual sterile fashion. Catheterized until the bladder was empty.  Exam under anesthesia revealed a small anteflexed uterus and no adnexal masses.  Dilute Marcaine solution placed, paracervical block, 20 mL total.  Cervix easily dilated up to 25 Pratt dilator.  Hysteroscope placed. Visualization reveals right lateral wall and posterior wall polypoid areas and otherwise thickened endometrium.  D and C performed with endometrial polyp forceps in addition to remove polypoid fragments. Revisualization reveals a clean endometrial cavity with bilateral normal tubal ostia.  At this  time, NovaSure device was entered, seated to a length of 6, a width of 3.7, and initiated after a negative CO2 test to 122 watts for 76 seconds.  Device was removed, inspected, and found to be without malfunction.  The revisualization of endometrial cavity reveals a well-ablated endometrial cavity.  No evidence of uterine defects or perforation at this time.  Procedure was terminated.  Deficit is as noted.  The patient tolerated the procedure well, was awakened, and transferred to recovery in good condition.     Lovenia Kim, M.D.     RJT/MEDQ  D:  04/28/2017  T:  04/28/2017  Job:  [id number]  cc:   Lovenia Kim, M.D. Fax: [phone]

## 2017-04-28 NOTE — Anesthesia Preprocedure Evaluation (Addendum)
Anesthesia Evaluation  Patient identified by MRN, date of birth, ID band Patient awake    Reviewed: Allergy & Precautions, NPO status , Patient's Chart, lab work & pertinent test results  History of Anesthesia Complications (+) PONV and history of anesthetic complications  Airway Mallampati: III  TM Distance: >3 FB Neck ROM: Full    Dental no notable dental hx.    Pulmonary sleep apnea , Current Smoker,    Pulmonary exam normal breath sounds clear to auscultation       Cardiovascular negative cardio ROS Normal cardiovascular exam Rhythm:Regular Rate:Normal  ECG: SR, rate 96   Neuro/Psych PSYCHIATRIC DISORDERS Anxiety Depression negative neurological ROS     GI/Hepatic (+)     substance abuse  ,   Endo/Other  Hypothyroidism Morbid obesity  Renal/GU negative Renal ROS   Abnormal Uterine Bleeding PCOS (polycystic ovarian syndrome)    Musculoskeletal Chronic back pain   Abdominal (+) + obese,   Peds  Hematology  (+) anemia ,   Anesthesia Other Findings   Reproductive/Obstetrics Abnormal Uterine Bleeding                            Anesthesia Physical Anesthesia Plan  ASA: III  Anesthesia Plan: General   Post-op Pain Management:    Induction: Intravenous  PONV Risk Score and Plan: 3 and Ondansetron, Dexamethasone, Midazolam and Scopolamine patch - Pre-op  Airway Management Planned: Oral ETT  Additional Equipment:   Intra-op Plan:   Post-operative Plan: Extubation in OR  Informed Consent: I have reviewed the patients History and Physical, chart, labs and discussed the procedure including the risks, benefits and alternatives for the proposed anesthesia with the patient or authorized representative who has indicated his/her understanding and acceptance.   Dental advisory given  Plan Discussed with: CRNA  Anesthesia Plan Comments:         Anesthesia Quick  Evaluation

## 2017-04-28 NOTE — Discharge Instructions (Addendum)
DISCHARGE INSTRUCTIONS: HYSTEROSCOPY / ENDOMETRIAL ABLATION The following instructions have been prepared to help you care for yourself upon your return home.  May Remove Scop patch on or before: 05/01/17  May take Ibuprofen after: 8:30 PM TONIGHT May take stool softner while taking narcotic pain medication to prevent constipation.  Drink plenty of water.  Personal hygiene:  Use sanitary pads for vaginal drainage, not tampons.  Shower the [place] after your procedure.  NO tub baths, pools or Jacuzzis for 2-3 weeks.  Wipe front to back after using the bathroom.  Activity and limitations:  Do NOT drive or operate any equipment for 24 hours. The effects of anesthesia are still present and drowsiness may result.  Do NOT rest in bed all [place].  Walking is encouraged.  Walk up and down stairs slowly.  You may resume your normal activity in one to two days or as indicated by your physician. Sexual activity: NO intercourse for at least 2 weeks after the procedure, or as indicated by your Doctor.  Diet: Eat a light meal as desired this evening. You may resume your usual diet tomorrow.  Return to Work: You may resume your work activities in one to two days or as indicated by Marine scientist.  What to expect after your surgery: Expect to have vaginal bleeding/discharge for 2-3 days and spotting for up to 10 days. It is not unusual to have soreness for up to 1-2 weeks. You may have a slight burning sensation when you urinate for the first [place]. Mild cramps may continue for a couple of days. You may have a regular period in 2-6 weeks.  Call your doctor for any of the following:  Excessive vaginal bleeding or clotting, saturating and changing one pad every hour.  Inability to urinate 6 hours after discharge from hospital.  Pain not relieved by pain medication.  Fever of 100.4 F or greater.  Unusual vaginal discharge or odor.  Return to office _________________Call for an appointment  ___________________ Patients signature: ______________________ Nurses signature ________________________  Post Anesthesia Care Unit [phone]   Post Anesthesia Home Care Instructions  Activity: Get plenty of rest for the remainder of the [place]. A responsible individual must stay with you for 24 hours following the procedure.  For the next 24 hours, DO NOT: -Drive a car -Paediatric nurse -Drink alcoholic beverages -Take any medication unless instructed by your physician -Make any legal decisions or sign important papers.  Meals: Start with liquid foods such as gelatin or soup. Progress to regular foods as tolerated. Avoid greasy, spicy, heavy foods. If nausea and/or vomiting occur, drink only clear liquids until the nausea and/or vomiting subsides. Call your physician if vomiting continues.  Special Instructions/Symptoms: Your throat may feel dry or sore from the anesthesia or the breathing tube placed in your throat during surgery. If this causes discomfort, gargle with warm salt water. The discomfort should disappear within 24 hours.  If you had a scopolamine patch placed behind your ear for the management of post- operative nausea and/or vomiting:  1. The medication in the patch is effective for 72 hours, after which it should be removed.  Wrap patch in a tissue and discard in the trash. Wash hands thoroughly with soap and water. 2. You may remove the patch earlier than 72 hours if you experience unpleasant side effects which may include dry mouth, dizziness or visual disturbances. 3. Avoid touching the patch. Wash your hands with soap and water after contact with the patch.

## 2017-04-28 NOTE — Anesthesia Procedure Notes (Signed)
Procedure Name: Intubation Date/Time: 04/28/2017 2:09 PM Performed by: Bufford Spikes Pre-anesthesia Checklist: Patient identified, Emergency Drugs available, Suction available and Patient being monitored Patient Re-evaluated:Patient Re-evaluated prior to induction Oxygen Delivery Method: Circle system utilized Preoxygenation: Pre-oxygenation with 100% oxygen Induction Type: IV induction Ventilation: Mask ventilation without difficulty Laryngoscope Size: Miller and 2 Grade View: Grade II Tube type: Oral Number of attempts: 1 Airway Equipment and Method: Stylet Placement Confirmation: ETT inserted through vocal cords under direct vision,  positive ETCO2 and breath sounds checked- equal and bilateral Secured at: 21 cm Tube secured with: Tape Dental Injury: Teeth and Oropharynx as per pre-operative assessment

## 2017-04-28 NOTE — Op Note (Signed)
04/28/2017  2:38 PM  PATIENT:  [name]  41 y.o. female  PRE-OPERATIVE DIAGNOSIS:  Abnormal Uterine Bleeding  POST-OPERATIVE DIAGNOSIS:  Abnormal Uterine Bleeding  PROCEDURE:  Procedure(s): DILATATION & CURETTAGE HYSTEROSCOPY WITH NOVASURE ABLATION ENDOMETRIAL POLYPECTOMY  SURGEON:  Surgeon(s): Brien Few, MD  ASSISTANTS: none   ANESTHESIA:   local and general  ESTIMATED BLOOD LOSS: MINIMAL AND FLUID DEFICIT 50CC  DRAINS: none   LOCAL MEDICATIONS USED:  MARCAINE    and Amount: 20 ml  SPECIMEN:  Source of Specimen:  [facility] AND POLYP  DISPOSITION OF SPECIMEN:  PATHOLOGY  COUNTS:  YES  DICTATION #: [id number]  PLAN OF CARE: DC HOME  PATIENT DISPOSITION:  PACU - hemodynamically stable.

## 2017-04-28 NOTE — H&P (Signed)
NAMERUSTI, ARIZMENDI                ACCOUNT NO.:  [id number]  MEDICAL RECORD NO.:  [id number]  LOCATION:                                 FACILITY:  PHYSICIAN:  Lovenia Kim, M.D.     DATE OF BIRTH:  DATE OF ADMISSION: DATE OF DISCHARGE:                             HISTORY & PHYSICAL   PREOPERATIVE DIAGNOSIS:  Menorrhagia for definitive therapy.  HISTORY OF PRESENT ILLNESS:  A 41 year old white female G1, P1, who presents with intermittent heavy menstrual bleeding and secondary anemia for definitive therapy.  Medications include Aygestin, Synthroid, Cymbalta, ibuprofen, and Neurontin.  She has allergies to [place], [place], [place].  She has a family historyof leukemia, diabetes, kidney disease, hypertension, heart disease, depression, and colon polyps.  She has a history of 1 cesarean section with tubal ligation.  She is a nondrinker.  She is an everyday smoker.  She denies domestic physical violence.  PHYSICAL EXAMINATION:  GENERAL:  A well-developed, well-nourished white female, in no acute distress. HEENT:  Normal. NECK:  Supple.  Full range of motion. LUNGS:  Clear. HEART:  Regular rate and rhythm. ABDOMEN:  Soft, nontender. PELVIC:  Bulky anteflexed uterus and no adnexal masses. EXTREMITIES:  No cords. NEUROLOGIC:  Nonfocal. SKIN:  Intact.  IMPRESSION:  Menometrorrhagia for definitive therapy.  PLAN:  Proceed with diagnostic hysteroscopy, D and C, NovaSure endometrial ablation.  Risks of anesthesia, infection, bleeding, injury to surrounding organs, possible need for repair discussed, delayed versus immediate complications to include bowel and bladder injury noted.  The patient acknowledges and wishes to proceed.     Lovenia Kim, M.D.     RJT/MEDQ  D:  04/27/2017  T:  04/27/2017  Job:  [id number]  cc:   Lovenia Kim, M.D. Fax: [phone]

## 2017-04-28 NOTE — Progress Notes (Signed)
Patient seen and examined. Consent witnessed and signed. No changes noted. Update completed. 

## 2017-04-28 NOTE — Transfer of Care (Signed)
Immediate Anesthesia Transfer of Care Note  Patient: [name]  Procedure(s) Performed: DILATATION & CURETTAGE/HYSTEROSCOPY WITH NOVASURE ABLATION (N/A )  Patient Location: PACU  Anesthesia Type:General  Level of Consciousness: awake, alert  and oriented  Airway & Oxygen Therapy: Patient Spontanous Breathing and Patient connected to nasal cannula oxygen  Post-op Assessment: Report given to RN and Post -op Vital signs reviewed and stable  Post vital signs: Reviewed and stable  Last Vitals:  Vitals:   04/28/17 1227  BP: (!) 157/98  Pulse: 100  Resp: 20  Temp: 37.3 C  SpO2: 99%    Last Pain:  Vitals:   04/28/17 1227  TempSrc: Oral  PainSc: 8       Patients Stated Pain Goal: 5 (46/80/32 1224)  Complications: No apparent anesthesia complications

## 2017-04-28 NOTE — Anesthesia Postprocedure Evaluation (Signed)
Anesthesia Post Note  Patient: [name]  Procedure(s) Performed: DILATATION & CURETTAGE/HYSTEROSCOPY WITH NOVASURE ABLATION (N/A )     Patient location during evaluation: PACU Anesthesia Type: General Level of consciousness: awake and alert Pain management: pain level controlled Vital Signs Assessment: post-procedure vital signs reviewed and stable Respiratory status: spontaneous breathing, nonlabored ventilation, respiratory function stable and patient connected to nasal cannula oxygen Cardiovascular status: blood pressure returned to baseline and stable Postop Assessment: no apparent nausea or vomiting Anesthetic complications: no    Last Vitals:  Vitals:   04/28/17 1715 04/28/17 1730  BP: (!) 167/95   Pulse: 84   Resp: (!) 22   Temp:  37.1 C  SpO2: 99%     Last Pain:  Vitals:   04/28/17 1715  TempSrc:   PainSc: 9    Pain Goal: Patients Stated Pain Goal: 5 (04/28/17 1715)               Dorothee Napierkowski P Shakayla Hickox

## 2017-04-29 ENCOUNTER — Inpatient Hospital Stay (HOSPITAL_COMMUNITY)
Admission: AD | Admit: 2017-04-29 | Discharge: 2017-04-29 | Discharge status: Against Medical Advice | Payer: Medicare HMO | Source: Ambulatory Visit | Attending: Obstetrics and Gynecology | Admitting: Obstetrics and Gynecology

## 2017-04-29 ENCOUNTER — Encounter (HOSPITAL_COMMUNITY): Payer: Self-pay | Admitting: Obstetrics and Gynecology

## 2017-04-29 DIAGNOSIS — Z5321 Procedure and treatment not carried out due to patient leaving prior to being seen by health care provider: Secondary | ICD-10-CM | POA: Diagnosis not present

## 2017-04-29 DIAGNOSIS — R5082 Postprocedural fever: Secondary | ICD-10-CM

## 2017-04-29 DIAGNOSIS — R109 Unspecified abdominal pain: Secondary | ICD-10-CM | POA: Diagnosis not present

## 2017-04-29 DIAGNOSIS — Z79899 Other long term (current) drug therapy: Secondary | ICD-10-CM | POA: Diagnosis not present

## 2017-04-29 DIAGNOSIS — F17210 Nicotine dependence, cigarettes, uncomplicated: Secondary | ICD-10-CM | POA: Diagnosis not present

## 2017-04-29 DIAGNOSIS — R1030 Lower abdominal pain, unspecified: Secondary | ICD-10-CM | POA: Diagnosis not present

## 2017-04-29 LAB — GLUCOSE, CAPILLARY: Glucose-Capillary: 132 mg/dL — ABNORMAL HIGH (ref 65–99)

## 2017-04-29 MED ORDER — SODIUM CHLORIDE 0.9 % IV BOLUS (SEPSIS): 500.0000 mL | Freq: Once | INTRAVENOUS | Status: DC

## 2017-04-29 MED ORDER — ONDANSETRON 4 MG PO TBDP
4.0000 mg | ORAL_TABLET | Freq: Once | ORAL | Status: AC
  Administered 2017-04-29: 4 mg via ORAL
  Filled 2017-04-29: qty 1

## 2017-04-29 MED ORDER — PROMETHAZINE HCL 25 MG/ML IJ SOLN: 12.5000 mg | Freq: Once | INTRAMUSCULAR | Status: DC

## 2017-04-29 MED ORDER — KETOROLAC TROMETHAMINE 60 MG/2ML IM SOLN
60.0000 mg | Freq: Once | INTRAMUSCULAR | Status: DC
Start: 2017-04-29 — End: 2017-04-29

## 2017-04-29 MED ORDER — KETOROLAC TROMETHAMINE 30 MG/ML IJ SOLN: 30.0000 mg | Freq: Once | INTRAMUSCULAR | Status: DC

## 2017-04-29 MED ORDER — HYDROMORPHONE HCL 1 MG/ML IJ SOLN: 1.0000 mg | Freq: Once | INTRAMUSCULAR | Status: DC

## 2017-04-29 NOTE — MAU Note (Addendum)
Pt had ablation, fibroidectomy, D&E yesterday by Dr. Ronita Hipps.  Pt C/O severe cramping ever since surgery, percocet not helping.  Having light bleeding.  Temp was 103.7 around 0530 this morning, took tylenol.  On arrival to MAU pt states she passed out in the parking lot.  Pt assisted to room in MAU, sat in floor beside bed on arrival to room.  Pt did not fall. Assisted into bed.  Pt states she has not voided since before her surgery.  Pt also states she took 4 percocet at once during the night because her pain was so severe.

## 2017-04-29 NOTE — MAU Provider Note (Signed)
History     CSN: [id number]  Arrival date and time: 04/29/17 2706   First Provider Initiated Contact with Patient 04/29/17 [phone]    Chief Complaint  Patient presents with  . Post-op Problem   HPI [name] is a 41 y.o. female who presents with abdominal pain & fever. Patient is 1 [place]umm s/p ablation for chronic abdominal pain & AUB. States as of 2 hours ago had temperature at home of 103.7; took dose of tylenol after that. Lower abdominal cramping since procedure that have worsened through the night. Has taken 4 percocet since midnight without relief. Rates pain 8/10 & describes as cramping/bloating. Nothing makes pain better or worse. Reports feeling like she passed out in the parking lot when she got to [place] nausea, no vomiting. Reports that she hasn't voided or had a BM since the procedure. Reports minimal vaginal bleeding since yesterday.   OB History    Gravida Para Term Preterm AB Living   1 1 1          SAB TAB Ectopic Multiple Live Births                  Past Medical History:  Diagnosis Date  . Alcohol abuse   . Anemia   . Anxiety   . Arthritis    knees bilateral   DJD  stenosis   . Blood dyscrasia    HSV  . Cancer ([place])    vaginal  malignant carcinoma  . Chronic back pain   . Complication of anesthesia    panic attack, fight or flight  . Drug-seeking behavior   . Endometriosis   . Fibroids   . Fibromyalgia   . Foot fracture    bilateral and toes  . GERD (gastroesophageal reflux disease)    history of  . Headache(784.0)    history of  . Hypothyroidism   . Neuromuscular disorder ([facility])    neuropathy  . PCOS (polycystic ovarian syndrome)   . Polysubstance abuse ([place])   . PONV (postoperative nausea and vomiting)   . Ruptured lumbar disc   . Sleep apnea    cpap  . Thyroid disease     Past Surgical History:  Procedure Laterality Date  . ADENOIDECTOMY    . BACK SURGERY     6 times  . CESAREAN SECTION    . DILITATION &  CURRETTAGE/HYSTROSCOPY WITH NOVASURE ABLATION N/A 04/28/2017   Procedure: DILATATION & CURETTAGE/HYSTEROSCOPY WITH NOVASURE ABLATION;  Surgeon: Brien Few, MD;  Location: [facility];  Service: Gynecology;  Laterality: N/A;  . TONSILLECTOMY    . TUBAL LIGATION      Family History  Problem Relation Age of Onset  . Arthritis Mother   . Clotting disorder Mother   . Diabetes Mother   . Hypertension Mother   . Hyperlipidemia Father   . Heart disease Father   . Prostate cancer Father   . Leukemia Maternal Grandmother   . Diabetes Maternal Grandfather   . Stroke Paternal Grandmother   . Heart disease Paternal Grandmother     Social History  Substance Use Topics  . Smoking status: Current Every Banh Smoker    Packs/Ksiazek: 1.00    Years: 20.00    Types: Cigarettes  . Smokeless tobacco: Never Used  . Alcohol use No     Comment: denies alcohol since 12/16    Allergies:  Allergies  Allergen Reactions  . Moxifloxacin Hives  . Quinolones Hives    Hives with moxifloxacin and  levofloxacin    Prescriptions Prior to Admission  Medication Sig Dispense Refill Last Dose  . clonazePAM (KLONOPIN) 1 MG tablet Take 1 mg by mouth 3 (three) times daily.    04/28/2017 at Unknown time  . DULoxetine (CYMBALTA) 60 MG capsule Take 60 mg by mouth daily.   04/27/2017 at Unknown time  . gabapentin (NEURONTIN) 600 MG tablet Take 600 mg by mouth 4 (four) times daily.   04/27/2017 at Unknown time  . ibuprofen (ADVIL,MOTRIN) 200 MG tablet Take 800 mg by mouth every 8 (eight) hours as needed (for pain.).   Past Week at Unknown time  . levothyroxine (SYNTHROID, LEVOTHROID) 75 MCG tablet Take 1 tablet (75 mcg total) by mouth daily before breakfast. 90 tablet 0 04/28/2017 at Unknown time  . oxyCODONE-acetaminophen (ROXICET) 5-325 MG tablet Take 1-2 tablets by mouth every 4 (four) hours as needed for severe pain. 30 tablet 0   . tiZANidine (ZANAFLEX) 2 MG tablet Take 6 mg by mouth 4 (four) times daily.   Past Month at  Unknown time  . zolpidem (AMBIEN) 10 MG tablet Take 10 mg by mouth at bedtime.    04/27/2017 at Unknown time    Review of Systems  Constitutional: Positive for fever. Negative for chills.  Respiratory: Negative.   Cardiovascular: Negative.   Gastrointestinal: Positive for abdominal distention, abdominal pain and nausea. Negative for vomiting.  Genitourinary: Positive for decreased urine volume.  Neurological: Positive for syncope. Negative for headaches.   Physical Exam   Blood pressure 136/67, pulse (!) 115, temperature 99.4 F (37.4 C), temperature source Oral, resp. rate (!) 24, SpO2 99 %.  Physical Exam  Nursing note and vitals reviewed. Constitutional: She is oriented to person, place, and time. She appears well-developed and well-nourished. No distress.  HENT:  Head: Normocephalic and atraumatic.  Eyes: Conjunctivae are normal. Right eye exhibits no discharge. Left eye exhibits no discharge. No scleral icterus.  Neck: Normal range of motion.  Cardiovascular: Normal rate, regular rhythm and normal heart sounds.   No murmur heard. Respiratory: Effort normal and breath sounds normal. No respiratory distress. She has no wheezes.  GI: Soft. Bowel sounds are normal. She exhibits no distension. There is tenderness in the right lower quadrant, suprapubic area and left lower quadrant. There is no rebound and no guarding.  Reports pain throughout lower abdomen with palpation although during my exam, facial expression/response doesn't reflect this.   Neurological: She is alert and oriented to person, place, and time.  Skin: Skin is warm and dry. She is not diaphoretic.  Psychiatric: She has a normal mood and affect. Her behavior is normal. Judgment and thought content normal.    MAU Course  Procedures   MDM CBC, IVF, phenergan, & toradol ordered Looked up on  Controlled Substance Database C/w Dr. Ronita Hipps. Patient with history of drug seeking behavior confirmed by patient's spouse.  Was rx oxycodone #30 by him yesterday. Uncomplicated procedure. Patient to be discharged home to continue previously prescribed meds.   RN unable to start IV. Patient states she doesn't want IV, only wants her dilaudid & requesting 10 mg of dilaudid. Informed of medication orders placed as per Dr. Ronita Hipps. States she will not take toradol & is adamant to speak with Dr. Ronita Hipps. RN to contact Dr. Ronita Hipps regarding patient's request to speak with him. Patient can be heard in her room yelling.   Care turned over to [place], NP 04/29/2017 8:39 AM  Assessment and Plan  Patient left  [place], CNM 04/29/2017 10:39 AM

## 2017-04-29 NOTE — MAU Note (Signed)
Pt informed Dr. Ronita Hipps is in surgery, is very upset he has only ordered toradol for her pain.  Pt informed lab is coming to draw her blood.  Pt states she is leaving, we have done nothing for her, wants to sign out AMA.  AMA form signed.

## 2017-04-29 NOTE — MAU Note (Signed)
IV attempt unsuccessful, IM meds ordered.  Pt does not want ordered meds.  Wishes to speak with Dr. Ronita Hipps on the phone.

## 2018-01-31 ENCOUNTER — Other Ambulatory Visit: Payer: Self-pay

## 2018-01-31 ENCOUNTER — Ambulatory Visit (HOSPITAL_COMMUNITY)
Admission: RE | Admit: 2018-01-31 | Discharge: 2018-01-31 | Disposition: A | Discharge status: Home / Self Care | Payer: Medicare HMO | Attending: Psychiatry | Admitting: Psychiatry

## 2018-01-31 ENCOUNTER — Emergency Department (HOSPITAL_COMMUNITY)
Admission: EM | Admit: 2018-01-31 | Discharge: 2018-02-01 | Disposition: A | Discharge status: Other Acute Inpatient Hospital | Payer: Medicare HMO | Attending: Emergency Medicine | Admitting: Emergency Medicine

## 2018-01-31 ENCOUNTER — Encounter (HOSPITAL_COMMUNITY): Payer: Self-pay

## 2018-01-31 DIAGNOSIS — F333 Major depressive disorder, recurrent, severe with psychotic symptoms: Secondary | ICD-10-CM | POA: Insufficient documentation

## 2018-01-31 DIAGNOSIS — E039 Hypothyroidism, unspecified: Secondary | ICD-10-CM | POA: Insufficient documentation

## 2018-01-31 DIAGNOSIS — Z8544 Personal history of malignant neoplasm of other female genital organs: Secondary | ICD-10-CM | POA: Diagnosis not present

## 2018-01-31 DIAGNOSIS — Z046 Encounter for general psychiatric examination, requested by authority: Secondary | ICD-10-CM | POA: Diagnosis not present

## 2018-01-31 DIAGNOSIS — Z8261 Family history of arthritis: Secondary | ICD-10-CM | POA: Insufficient documentation

## 2018-01-31 DIAGNOSIS — Z634 Disappearance and death of family member: Secondary | ICD-10-CM | POA: Diagnosis not present

## 2018-01-31 DIAGNOSIS — E282 Polycystic ovarian syndrome: Secondary | ICD-10-CM | POA: Insufficient documentation

## 2018-01-31 DIAGNOSIS — Z8589 Personal history of malignant neoplasm of other organs and systems: Secondary | ICD-10-CM | POA: Insufficient documentation

## 2018-01-31 DIAGNOSIS — F329 Major depressive disorder, single episode, unspecified: Secondary | ICD-10-CM | POA: Diagnosis present

## 2018-01-31 DIAGNOSIS — G629 Polyneuropathy, unspecified: Secondary | ICD-10-CM | POA: Insufficient documentation

## 2018-01-31 DIAGNOSIS — Z881 Allergy status to other antibiotic agents status: Secondary | ICD-10-CM | POA: Insufficient documentation

## 2018-01-31 DIAGNOSIS — F17210 Nicotine dependence, cigarettes, uncomplicated: Secondary | ICD-10-CM | POA: Insufficient documentation

## 2018-01-31 DIAGNOSIS — M797 Fibromyalgia: Secondary | ICD-10-CM | POA: Insufficient documentation

## 2018-01-31 DIAGNOSIS — M1990 Unspecified osteoarthritis, unspecified site: Secondary | ICD-10-CM | POA: Insufficient documentation

## 2018-01-31 DIAGNOSIS — Z79899 Other long term (current) drug therapy: Secondary | ICD-10-CM | POA: Diagnosis not present

## 2018-01-31 DIAGNOSIS — Z832 Family history of diseases of the blood and blood-forming organs and certain disorders involving the immune mechanism: Secondary | ICD-10-CM | POA: Insufficient documentation

## 2018-01-31 DIAGNOSIS — Z888 Allergy status to other drugs, medicaments and biological substances status: Secondary | ICD-10-CM | POA: Insufficient documentation

## 2018-01-31 DIAGNOSIS — Z8249 Family history of ischemic heart disease and other diseases of the circulatory system: Secondary | ICD-10-CM | POA: Insufficient documentation

## 2018-01-31 DIAGNOSIS — F314 Bipolar disorder, current episode depressed, severe, without psychotic features: Secondary | ICD-10-CM | POA: Insufficient documentation

## 2018-01-31 DIAGNOSIS — G4730 Sleep apnea, unspecified: Secondary | ICD-10-CM | POA: Insufficient documentation

## 2018-01-31 DIAGNOSIS — Z833 Family history of diabetes mellitus: Secondary | ICD-10-CM | POA: Insufficient documentation

## 2018-01-31 NOTE — H&P (Signed)
Behavioral Health Medical Screening Exam  [name] is an 42 y.o. female.  Total Time spent with patient: 20 minutes  Psychiatric Specialty Exam: Physical Exam  Constitutional: She is oriented to person, place, and time. She appears well-developed and well-nourished. No distress.  HENT:  Head: Normocephalic and atraumatic.  Right Ear: External ear normal.  Eyes: Conjunctivae are normal.  Respiratory: Effort normal. No respiratory distress.  Musculoskeletal: Normal range of motion.  Neurological: She is alert and oriented to person, place, and time.  Skin: Skin is warm and dry. She is not diaphoretic.  Psychiatric: Her mood appears anxious. Her affect is labile. Thought content is not paranoid and not delusional. Cognition and memory are normal. She expresses impulsivity and inappropriate judgment. She exhibits a depressed mood. She expresses suicidal ideation. She expresses no homicidal ideation. She expresses no suicidal plans.    Review of Systems  Constitutional: Negative for chills, fever and weight loss.  Psychiatric/Behavioral: Positive for depression, hallucinations, substance abuse and suicidal ideas. Negative for memory loss. The patient is nervous/anxious and has insomnia.     There were no vitals taken for this visit.There is no height or weight on file to calculate BMI.  General Appearance: Disheveled  Eye Contact:  Fair  Speech:  Clear and Coherent and Pressured  Volume:  Normal  Mood:  Anxious, Depressed, Dysphoric, Hopeless, Irritable and Worthless  Affect:  Congruent and Depressed  Thought Process:  Coherent and Descriptions of Associations: Intact  Orientation:  Full (Time, Place, and Person)  Thought Content:  Logical and Hallucinations: Visual  Suicidal Thoughts:  Yes.  without intent/plan  Homicidal Thoughts:  No  Memory:  Immediate;   Fair Recent;   Fair  Judgement:  Impaired  Insight:  Fair  Psychomotor Activity:  Restlessness  Concentration:  Concentration: Poor and Attention Span: Poor  Recall:  Gypsy: Good  Akathisia:  No  Handed:  Right  AIMS (if indicated):     Assets:  Communication Skills Desire for Improvement Financial Resources/Insurance Housing Leisure Time Physical Health Transportation  Sleep:       Musculoskeletal: Strength & Muscle Tone: within normal limits Gait & Station: normal    Recommendations:  Based on my evaluation the patient does not appear to have an emergency medical condition.  Recommend psychiatric Inpatient admission when medically cleared.  Rozetta Nunnery, NP 01/31/2018, 10:19 PM

## 2018-01-31 NOTE — ED Triage Notes (Signed)
Patient went to [facility] for help after husband Garnet Koyanagi) passed January 25, 2018. Patient states that she has not been sleeping since then. [facility] brought patient here for medical clearance. Patient is pacing the floor and crying. She stated that all she has done since her husband passed away was smoke cigarettes and drink [facility]. She stated that she relapsed with cocaine the night before funeral ( 01-29-18). She stated a friend came by and she snorted 3 lines of cocaine.
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
## 2018-02-01 ENCOUNTER — Inpatient Hospital Stay (HOSPITAL_COMMUNITY)
Admission: AD | Admit: 2018-02-01 | Discharge: 2018-02-03 | DRG: 885 | Disposition: A | Discharge status: Home / Self Care | Payer: Medicare HMO | Source: Intra-hospital | Attending: Psychiatry | Admitting: Psychiatry

## 2018-02-01 ENCOUNTER — Encounter (HOSPITAL_COMMUNITY): Payer: Self-pay

## 2018-02-01 DIAGNOSIS — G4733 Obstructive sleep apnea (adult) (pediatric): Secondary | ICD-10-CM | POA: Diagnosis present

## 2018-02-01 DIAGNOSIS — F329 Major depressive disorder, single episode, unspecified: Secondary | ICD-10-CM | POA: Diagnosis present

## 2018-02-01 DIAGNOSIS — F419 Anxiety disorder, unspecified: Secondary | ICD-10-CM | POA: Diagnosis present

## 2018-02-01 DIAGNOSIS — R450 Nervousness: Secondary | ICD-10-CM | POA: Diagnosis not present

## 2018-02-01 DIAGNOSIS — Z87891 Personal history of nicotine dependence: Secondary | ICD-10-CM

## 2018-02-01 DIAGNOSIS — E039 Hypothyroidism, unspecified: Secondary | ICD-10-CM | POA: Diagnosis present

## 2018-02-01 DIAGNOSIS — Z634 Disappearance and death of family member: Secondary | ICD-10-CM | POA: Diagnosis not present

## 2018-02-01 DIAGNOSIS — F17210 Nicotine dependence, cigarettes, uncomplicated: Secondary | ICD-10-CM | POA: Diagnosis not present

## 2018-02-01 DIAGNOSIS — F1410 Cocaine abuse, uncomplicated: Secondary | ICD-10-CM | POA: Diagnosis not present

## 2018-02-01 DIAGNOSIS — F333 Major depressive disorder, recurrent, severe with psychotic symptoms: Secondary | ICD-10-CM | POA: Diagnosis present

## 2018-02-01 DIAGNOSIS — F1210 Cannabis abuse, uncomplicated: Secondary | ICD-10-CM | POA: Diagnosis not present

## 2018-02-01 LAB — RAPID URINE DRUG SCREEN, HOSP PERFORMED
Amphetamines: POSITIVE — AB
Benzodiazepines: POSITIVE — AB
COCAINE: POSITIVE — AB
Opiates: NOT DETECTED
Tetrahydrocannabinol: POSITIVE — AB

## 2018-02-01 LAB — COMPREHENSIVE METABOLIC PANEL
ALT: 18 U/L (ref 0–44)
AST: 19 U/L (ref 15–41)
Albumin: 3.8 g/dL (ref 3.5–5.0)
Alkaline Phosphatase: 53 U/L (ref 38–126)
Anion gap: 10 (ref 5–15)
BILIRUBIN TOTAL: 0.4 mg/dL (ref 0.3–1.2)
BUN: 12 mg/dL (ref 6–20)
CALCIUM: 9.3 mg/dL (ref 8.9–10.3)
CHLORIDE: 108 mmol/L (ref 98–111)
CO2: 24 mmol/L (ref 22–32)
Creatinine, Ser: 1.6 mg/dL — ABNORMAL HIGH (ref 0.44–1.00)
GFR, EST AFRICAN AMERICAN: 45 mL/min — AB (ref >60)
GFR, EST NON AFRICAN AMERICAN: 39 mL/min — AB (ref >60)
Glucose, Bld: 97 mg/dL (ref 70–99)
Potassium: 3.7 mmol/L (ref 3.5–5.1)
Sodium: 142 mmol/L (ref 135–145)
Total Protein: 6.7 g/dL (ref 6.5–8.1)

## 2018-02-01 LAB — CBC
HEMATOCRIT: 40.5 % (ref 36.0–46.0)
Hemoglobin: 13.2 g/dL (ref 12.0–15.0)
MCH: 29.8 pg (ref 26.0–34.0)
MCHC: 32.6 g/dL (ref 30.0–36.0)
MCV: 91.4 fL (ref 78.0–100.0)
PLATELETS: 207 10*3/uL (ref 150–400)
RBC: 4.43 MIL/uL (ref 3.87–5.11)
RDW: 14.7 % (ref 11.5–15.5)
WBC: 7.4 10*3/uL (ref 4.0–10.5)

## 2018-02-01 LAB — ETHANOL

## 2018-02-01 LAB — HCG, QUANTITATIVE, PREGNANCY

## 2018-02-01 MED ORDER — CLONIDINE HCL 0.1 MG PO TABS
0.1000 mg | ORAL_TABLET | Freq: Four times a day (QID) | ORAL | Status: DC
  Administered 2018-02-01 (×2): 0.1 mg via ORAL
  Filled 2018-02-01 (×12): qty 1

## 2018-02-01 MED ORDER — ONDANSETRON HCL 4 MG PO TABS: 4.0000 mg | ORAL_TABLET | Freq: Three times a day (TID) | ORAL | Status: DC | PRN

## 2018-02-01 MED ORDER — NAPROXEN 500 MG PO TABS: 500.0000 mg | ORAL_TABLET | Freq: Two times a day (BID) | ORAL | Status: DC | PRN

## 2018-02-01 MED ORDER — DICYCLOMINE HCL 20 MG PO TABS: 20.0000 mg | ORAL_TABLET | Freq: Four times a day (QID) | ORAL | Status: DC | PRN

## 2018-02-01 MED ORDER — NICOTINE 21 MG/24HR TD PT24
21.0000 mg | MEDICATED_PATCH | Freq: Every day | TRANSDERMAL | Status: DC
  Administered 2018-02-02 – 2018-02-03 (×2): 21 mg via TRANSDERMAL
  Filled 2018-02-01 (×6): qty 1

## 2018-02-01 MED ORDER — NICOTINE 21 MG/24HR TD PT24: 21.0000 mg | MEDICATED_PATCH | Freq: Every day | TRANSDERMAL | Status: DC

## 2018-02-01 MED ORDER — NICOTINE 21 MG/24HR TD PT24
21.0000 mg | MEDICATED_PATCH | Freq: Every day | TRANSDERMAL | Status: DC
  Administered 2018-02-01: 21 mg via TRANSDERMAL
  Filled 2018-02-01: qty 1

## 2018-02-01 MED ORDER — CLONIDINE HCL 0.1 MG PO TABS: 0.1000 mg | ORAL_TABLET | Freq: Every day | ORAL | Status: DC

## 2018-02-01 MED ORDER — TRAMADOL HCL 50 MG PO TABS
50.0000 mg | ORAL_TABLET | Freq: Four times a day (QID) | ORAL | Status: DC
  Administered 2018-02-01: 50 mg via ORAL
  Filled 2018-02-01 (×2): qty 1

## 2018-02-01 MED ORDER — DULOXETINE HCL 60 MG PO CPEP
60.0000 mg | ORAL_CAPSULE | Freq: Two times a day (BID) | ORAL | Status: DC
  Administered 2018-02-01 – 2018-02-03 (×4): 60 mg via ORAL
  Filled 2018-02-01 (×10): qty 1

## 2018-02-01 MED ORDER — CHLORDIAZEPOXIDE HCL 25 MG PO CAPS: 25.0000 mg | ORAL_CAPSULE | Freq: Every day | ORAL | Status: DC

## 2018-02-01 MED ORDER — GABAPENTIN 400 MG PO CAPS
800.0000 mg | ORAL_CAPSULE | Freq: Four times a day (QID) | ORAL | Status: DC
  Administered 2018-02-01 – 2018-02-02 (×5): 800 mg via ORAL
  Filled 2018-02-01 (×16): qty 2

## 2018-02-01 MED ORDER — ONDANSETRON 4 MG PO TBDP
4.0000 mg | ORAL_TABLET | Freq: Four times a day (QID) | ORAL | Status: DC | PRN
  Administered 2018-02-01 – 2018-02-02 (×3): 4 mg via ORAL
  Filled 2018-02-01 (×3): qty 1

## 2018-02-01 MED ORDER — ACETAMINOPHEN 325 MG PO TABS: 650.0000 mg | ORAL_TABLET | ORAL | Status: DC | PRN

## 2018-02-01 MED ORDER — HYDROXYZINE HCL 25 MG PO TABS
25.0000 mg | ORAL_TABLET | Freq: Four times a day (QID) | ORAL | Status: DC | PRN
  Administered 2018-02-01 – 2018-02-02 (×2): 25 mg via ORAL
  Filled 2018-02-01 (×2): qty 1

## 2018-02-01 MED ORDER — METHOCARBAMOL 500 MG PO TABS: 500.0000 mg | ORAL_TABLET | Freq: Three times a day (TID) | ORAL | Status: DC | PRN

## 2018-02-01 MED ORDER — LORAZEPAM 1 MG PO TABS
1.0000 mg | ORAL_TABLET | Freq: Once | ORAL | Status: AC
  Administered 2018-02-01: 1 mg via ORAL
  Filled 2018-02-01: qty 1

## 2018-02-01 MED ORDER — CHLORDIAZEPOXIDE HCL 25 MG PO CAPS: 25.0000 mg | ORAL_CAPSULE | Freq: Four times a day (QID) | ORAL | Status: DC

## 2018-02-01 MED ORDER — THIAMINE HCL 100 MG/ML IJ SOLN: 100.0000 mg | Freq: Once | INTRAMUSCULAR | Status: DC

## 2018-02-01 MED ORDER — CHLORDIAZEPOXIDE HCL 25 MG PO CAPS: 25.0000 mg | ORAL_CAPSULE | Freq: Four times a day (QID) | ORAL | Status: DC | PRN

## 2018-02-01 MED ORDER — ZOLPIDEM TARTRATE 10 MG PO TABS
10.0000 mg | ORAL_TABLET | Freq: Every day | ORAL | Status: DC
  Administered 2018-02-01: 10 mg via ORAL
  Filled 2018-02-01: qty 1

## 2018-02-01 MED ORDER — CHLORDIAZEPOXIDE HCL 25 MG PO CAPS: 25.0000 mg | ORAL_CAPSULE | Freq: Three times a day (TID) | ORAL | Status: DC

## 2018-02-01 MED ORDER — CLONIDINE HCL 0.1 MG PO TABS
0.1000 mg | ORAL_TABLET | ORAL | Status: DC
  Filled 2018-02-01: qty 1

## 2018-02-01 MED ORDER — DULOXETINE HCL 30 MG PO CPEP
60.0000 mg | ORAL_CAPSULE | Freq: Two times a day (BID) | ORAL | Status: DC
  Administered 2018-02-01: 60 mg via ORAL
  Filled 2018-02-01: qty 2

## 2018-02-01 MED ORDER — CLONAZEPAM 1 MG PO TABS: 1.0000 mg | ORAL_TABLET | Freq: Three times a day (TID) | ORAL | Status: DC | PRN

## 2018-02-01 MED ORDER — CLONAZEPAM 1 MG PO TABS
1.0000 mg | ORAL_TABLET | Freq: Three times a day (TID) | ORAL | Status: DC
  Administered 2018-02-01 – 2018-02-02 (×4): 1 mg via ORAL
  Filled 2018-02-01 (×4): qty 1

## 2018-02-01 MED ORDER — GABAPENTIN 400 MG PO CAPS
800.0000 mg | ORAL_CAPSULE | Freq: Four times a day (QID) | ORAL | Status: DC
  Administered 2018-02-01 (×2): 800 mg via ORAL
  Filled 2018-02-01 (×2): qty 2

## 2018-02-01 MED ORDER — LOPERAMIDE HCL 2 MG PO CAPS: 2.0000 mg | ORAL_CAPSULE | ORAL | Status: DC | PRN

## 2018-02-01 MED ORDER — ALUM & MAG HYDROXIDE-SIMETH 200-200-20 MG/5ML PO SUSP: 30.0000 mL | ORAL | Status: DC | PRN

## 2018-02-01 MED ORDER — MAGNESIUM HYDROXIDE 400 MG/5ML PO SUSP: 30.0000 mL | Freq: Every day | ORAL | Status: DC | PRN

## 2018-02-01 MED ORDER — ADULT MULTIVITAMIN W/MINERALS CH
1.0000 | ORAL_TABLET | Freq: Every day | ORAL | Status: DC
  Administered 2018-02-01 – 2018-02-02 (×2): 1 via ORAL
  Filled 2018-02-01 (×5): qty 1

## 2018-02-01 MED ORDER — CHLORDIAZEPOXIDE HCL 25 MG PO CAPS: 25.0000 mg | ORAL_CAPSULE | Freq: Once | ORAL | Status: DC

## 2018-02-01 MED ORDER — LEVOTHYROXINE SODIUM 75 MCG PO TABS
75.0000 ug | ORAL_TABLET | Freq: Every day | ORAL | Status: DC
  Administered 2018-02-02: 75 ug via ORAL
  Filled 2018-02-01 (×4): qty 1

## 2018-02-01 MED ORDER — VITAMIN B-1 100 MG PO TABS
100.0000 mg | ORAL_TABLET | Freq: Every day | ORAL | Status: DC
  Administered 2018-02-02: 100 mg via ORAL
  Filled 2018-02-01 (×3): qty 1

## 2018-02-01 MED ORDER — ACETAMINOPHEN 325 MG PO TABS: 650.0000 mg | ORAL_TABLET | Freq: Four times a day (QID) | ORAL | Status: DC | PRN

## 2018-02-01 MED ORDER — TRAMADOL HCL 50 MG PO TABS: 50.0000 mg | ORAL_TABLET | Freq: Four times a day (QID) | ORAL | Status: DC

## 2018-02-01 MED ORDER — TIZANIDINE HCL 4 MG PO TABS: 6.0000 mg | ORAL_TABLET | Freq: Four times a day (QID) | ORAL | Status: DC

## 2018-02-01 MED ORDER — GABAPENTIN 400 MG PO CAPS: 800.0000 mg | ORAL_CAPSULE | Freq: Four times a day (QID) | ORAL | Status: DC

## 2018-02-01 MED ORDER — TIZANIDINE HCL 4 MG PO TABS
6.0000 mg | ORAL_TABLET | Freq: Four times a day (QID) | ORAL | Status: DC
  Administered 2018-02-01: 6 mg via ORAL
  Filled 2018-02-01 (×2): qty 2

## 2018-02-01 MED ORDER — LEVOTHYROXINE SODIUM 75 MCG PO TABS
75.0000 ug | ORAL_TABLET | Freq: Every day | ORAL | Status: DC
  Administered 2018-02-01: 75 ug via ORAL
  Filled 2018-02-01: qty 1

## 2018-02-01 MED ORDER — CHLORDIAZEPOXIDE HCL 25 MG PO CAPS: 25.0000 mg | ORAL_CAPSULE | ORAL | Status: DC

## 2018-02-01 NOTE — Progress Notes (Addendum)
Patient ID: [name], female   DOB: 02-Mar-1976, 42 y.o.   MRN: [id number]  Admission Note  D) Patient admitted to the adult unit 300 hall. Patient is a 42 year old female who presented Voluntary as a walk-in to [facility]. Patient states to Probation officer that her husband died on Feb 09, 2023 and that "I haven't eaten, slept or taken my medications since". Patient reports, "all I've done is cry, sit there, and smoke". Patient was tearful and anxious during the admission process. Patient was fixated on getting her medications. Patient stated, "[facility] was so mean to me. They wouldn't let me have my home medications. 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 Patient reports she takes: Klonopin, Subutex, Ambien, Zanaflex, Synthroid, Cymbalta, Gabapentin, and Tramadol as scheduled medications. Patient reports she relapsed on cocaine "on July 6, the [place] before the funeral". Patient states, "I have been sober six years and three months". Patient denies alcohol use. Per chart review, patient UDS positive for cocaine, amphetamines, benzo's, and THC. Per chart review, patient was seen at ED 03/2015 for polysubstance abuse. Patient denies SI/HI on admission and reports "sometimes I see my husband since he's passed". Patient denied AVH on admission. Patient reports she lives with her daughter and will discharge there.  A) Skin assessment was completed and unremarkable. Patient belongings searched with no contraband found. Belongings in locker #41. Plan of care, unit policies and patient expectations were explained. Patient receptive to information given with no questions. Patient verbalized understanding and contracted for safety on the unit. Written consents obtained. Vital signs obtained and WNL. Snacks and fluids provided, meal tray offered. Patient oriented to the unit and their room. Patient placed on standard q15 safety checks. Low fall risk precautions initiated and reviewed with patient; patient verbalized  understanding.   R) Patient remains safe on the unit at this time and is eating a sandwich tray in her room.

## 2018-02-01 NOTE — ED Notes (Signed)
When I notified patient that she was being transferred to [facility], she stated that she didn't want to go there. She stated that she should just go home. Notified Mia, PA. She stated that she would come and talk to patient when she was finished with a procedure. I notified patient.

## 2018-02-01 NOTE — ED Provider Notes (Signed)
[facility] DEPT Provider Note   CSN: [id number] Arrival date & time: 01/31/18  2222     History   Chief Complaint Chief Complaint  Patient presents with  . Depression    HPI [name] is a 42 y.o. female with a history of alcohol abuse, depression, chronic back pain, polysubstance abuse, tobacco abuse, Bipolar 1 Disorder, and OSA who presents to the emergency department with a chief complaint of depressed mood.  The patient reports that she was seen at [facility] yesterday and was sent to the ED for medical clearance.  She reports that her husband passed away on 02/16/18.  She reports that she has barely eaten or slept since he passed away.  She reports that she has been smoking 1.5 packs of cigarettes per [place], but has barely eaten or drank since he passed away. She reports that she relapsed with cocaine on 01/29/18 after 3 months of sobriety.  She denies HI, SI, and auditory hallucinations. She reports she has been seeing vision of her husband for the last few days. No other visual hallucinations.   She has established with Vicente Males at [facility] for her therapy and psychiatric needs.  She denies chest pain, dyspnea, fever, chills, abdominal pain, vomiting, rash, numbness, or weakness.  The history is provided by the patient. No language interpreter was used.    Past Medical History:  Diagnosis Date  . Alcohol abuse   . Anemia   . Anxiety   . Arthritis    knees bilateral   DJD  stenosis   . Blood dyscrasia    HSV  . Cancer ([place])    vaginal  malignant carcinoma  . Chronic back pain   . Complication of anesthesia    panic attack, fight or flight  . Drug-seeking behavior   . Endometriosis   . Fibroids   . Fibromyalgia   . Foot fracture    bilateral and toes  . GERD (gastroesophageal reflux disease)    history of  . Headache(784.0)    history of  . Hypothyroidism   . Neuromuscular disorder (HCC)    neuropathy  . PCOS  (polycystic ovarian syndrome)   . Polysubstance abuse ([place])   . PONV (postoperative nausea and vomiting)   . Ruptured lumbar disc   . Sleep apnea    cpap  . Thyroid disease     Patient Active Problem List   Diagnosis Date Noted  . ETOH abuse 07/18/2014  . Alcoholic pancreatitis 17/40/8144  . Drug-seeking behavior 07/18/2014  . Acute pancreatitis 07/16/2014  . Alcohol intoxication in active alcoholic without complication ([place])   . Depression   . Polysubstance dependence including opioid type drug, episodic abuse ([place]) 06/26/2014  . Substance induced mood disorder ([place]) 05/03/2014  . Cocaine abuse with intoxication with perceptual disturbance ([place]) 04/12/2014  . Alcohol dependence with uncomplicated withdrawal ([place]) 04/03/2014  . Alcohol dependence with withdrawal with complication ([place]) 81/85/6314  . Herpes labialis 10/08/2013  . Lumbar radiculopathy, chronic 10/08/2013  . Nicotine addiction 07/30/2012  . Fall down stairs 05/27/2012  . Foot fracture 05/27/2012  . Obesity, Class III, BMI 40-49.9 (morbid obesity) ([place]) 10/31/2011  . Mood disorder ([place]) 10/31/2011  . Hypothyroidism 10/31/2011  . Obstructive sleep apnea 10/31/2011  . Narcotic abuse in remission ([place])   . Chronic back pain     Past Surgical History:  Procedure Laterality Date  . ADENOIDECTOMY    . BACK SURGERY     6 times  .  CESAREAN SECTION    . DILITATION & CURRETTAGE/HYSTROSCOPY WITH NOVASURE ABLATION N/A 04/28/2017   Procedure: DILATATION & CURETTAGE/HYSTEROSCOPY WITH NOVASURE ABLATION;  Surgeon: Brien Few, MD;  Location: [facility];  Service: Gynecology;  Laterality: N/A;  . TONSILLECTOMY    . TUBAL LIGATION       OB History    Gravida  1   Para  1   Term  1   Preterm      AB      Living        SAB      TAB      Ectopic      Multiple      Live Births               Home Medications    Prior to Admission medications   Medication Sig Start Date End Date Taking? Authorizing  Provider  buprenorphine (SUBUTEX) 8 MG SUBL SL tablet Place 8 mg under the tongue 3 (three) times daily.   Yes [provider]  clonazePAM (KLONOPIN) 2 MG tablet Take 2 mg by mouth 3 (three) times daily.   Yes [provider]  diazepam (VALIUM) 5 MG tablet Take 5-10 mg by mouth See admin instructions. 1 hour before dental appointment 12/09/17  Yes [provider]  DULoxetine (CYMBALTA) 60 MG capsule Take 60 mg by mouth 2 (two) times daily.    Yes [provider]  gabapentin (NEURONTIN) 800 MG tablet Take 800 mg by mouth 4 (four) times daily.   Yes [provider]  ibuprofen (ADVIL,MOTRIN) 200 MG tablet Take 800 mg by mouth every 8 (eight) hours as needed (for pain.).   Yes [provider]  levothyroxine (SYNTHROID, LEVOTHROID) 75 MCG tablet Take 1 tablet (75 mcg total) by mouth daily before breakfast. 08/25/14  Yes Leandrew Koyanagi, MD  tiZANidine (ZANAFLEX) 2 MG tablet Take 6 mg by mouth 4 (four) times daily.   Yes [provider]  traMADol (ULTRAM) 50 MG tablet Take 50 mg by mouth 4 (four) times daily.   Yes [provider]  zolpidem (AMBIEN) 10 MG tablet Take 10 mg by mouth at bedtime.    Yes [provider]  oxyCODONE-acetaminophen (ROXICET) 5-325 MG tablet Take 1-2 tablets by mouth every 4 (four) hours as needed for severe pain. Patient not taking: Reported on 02/01/2018 04/28/17   Brien Few, MD    Family History Family History  Problem Relation Age of Onset  . Arthritis Mother   . Clotting disorder Mother   . Diabetes Mother   . Hypertension Mother   . Hyperlipidemia Father   . Heart disease Father   . Prostate cancer Father   . Leukemia Maternal Grandmother   . Diabetes Maternal Grandfather   . Stroke Paternal Grandmother   . Heart disease Paternal Grandmother     Social History Social History   Tobacco Use  . Smoking status: Current Every [place] Smoker    Packs/[place]: 1.00    Years: 20.00     Pack years: 20.00    Types: Cigarettes  . Smokeless tobacco: Never Used  Substance Use Topics  . Alcohol use: No    Comment: denies alcohol since 12/16  . Drug use: Yes    Types: Cocaine    Comment: narcotic abuse     Allergies   Moxifloxacin and Quinolones   Review of Systems Review of Systems  Constitutional: Negative for activity change, chills and fever.  Respiratory: Negative for shortness of  breath.   Cardiovascular: Negative for chest pain.  Gastrointestinal: Negative for abdominal pain, diarrhea, nausea and vomiting.  Genitourinary: Negative for dysuria.  Musculoskeletal: Negative for back pain and neck pain.  Skin: Negative for rash.  Allergic/Immunologic: Negative for immunocompromised state.  Neurological: Negative for seizures, weakness, numbness and headaches.  Psychiatric/Behavioral: Positive for dysphoric mood, hallucinations and sleep disturbance. Negative for confusion, self-injury and suicidal ideas. The patient is not nervous/anxious.    Physical Exam Updated Vital Signs BP 104/61 (BP Location: Left Arm)   Pulse 96   Temp 98.6 F (37 C) (Oral)   Resp 18   Ht 5\' 6"  (1.676 m)   Wt 131.5 kg (290 lb)   SpO2 96%   BMI 46.81 kg/m   Physical Exam  Constitutional: No distress.  HENT:  Head: Normocephalic.  Eyes: Conjunctivae are normal.  Neck: Neck supple.  Cardiovascular: Normal rate, regular rhythm, normal heart sounds and intact distal pulses. Exam reveals no gallop and no friction rub.  No murmur heard. Pulmonary/Chest: Effort normal. No stridor. No respiratory distress. She has no wheezes. She has no rales. She exhibits no tenderness.  Abdominal: Soft. She exhibits no distension.  Neurological: She is alert.  Skin: Skin is warm. No rash noted.  Psychiatric: Her behavior is normal. Her speech is rapid and/or pressured. She expresses impulsivity. She exhibits a depressed mood. She expresses no homicidal and no suicidal ideation. She expresses no  suicidal plans and no homicidal plans.  Nursing note and vitals reviewed.  ED Treatments / Results  Labs (all labs ordered are listed, but only abnormal results are displayed) Labs Reviewed  COMPREHENSIVE METABOLIC PANEL - Abnormal; Notable for the following components:      Result Value   Creatinine, Ser 1.60 (*)    GFR calc non Af Amer 39 (*)    GFR calc Af Amer 45 (*)    All other components within normal limits  RAPID URINE DRUG SCREEN, HOSP PERFORMED - Abnormal; Notable for the following components:   Cocaine POSITIVE (*)    Benzodiazepines POSITIVE (*)    Amphetamines POSITIVE (*)    Tetrahydrocannabinol POSITIVE (*)    Barbiturates   (*)    Value: Result not available. Reagent lot number recalled by manufacturer.   All other components within normal limits  ETHANOL  CBC  HCG, QUANTITATIVE, PREGNANCY    EKG None  Radiology No results found.  Procedures Procedures (including critical care time)  Medications Ordered in ED Medications  ondansetron (ZOFRAN) tablet 4 mg (has no administration in time range)  acetaminophen (TYLENOL) tablet 650 mg (has no administration in time range)  levothyroxine (SYNTHROID, LEVOTHROID) tablet 75 mcg (has no administration in time range)  zolpidem (AMBIEN) tablet 10 mg (10 mg Oral Given 02/01/18 0148)  gabapentin (NEURONTIN) capsule 800 mg (800 mg Oral Given 02/01/18 0213)  nicotine (NICODERM CQ - dosed in mg/24 hours) patch 21 mg (21 mg Transdermal Patch Applied 02/01/18 0213)  tiZANidine (ZANAFLEX) tablet 6 mg (6 mg Oral Given 02/01/18 3299)  traMADol (ULTRAM) tablet 50 mg (50 mg Oral Given 02/01/18 0211)  LORazepam (ATIVAN) tablet 1 mg (1 mg Oral Given 02/01/18 0211)     Initial Impression / Assessment and Plan / ED Course  I have reviewed the triage vital signs and the nursing notes.  Pertinent labs & imaging results that were available during my care of the patient were reviewed by me and considered in my medical decision making (see  chart for details).  42 year old female with a history of alcohol abuse, depression, chronic back pain, polysubstance abuse, tobacco abuse, Bipolar 1 Disorder, and OSA who presents to the emergency department with a chief complaint of depressed mood, decreased sleep, and poor appetite for the last 1.5 weeks that began after her long-term partner passed away. Pt medically cleared at this time. Labs are notable for Cr of 1.6 and UDS positive for cocaine, benzodiazepines, and THC. She is tolerating fluids by mouth without N/V.   Patient is voluntary at this time. Psych hold orders and appropriate home med orders placed. TTS recommends inpatient treatment; please see psych team notes for further documentation of care/dispo. Pt stable at time of med clearance.    Final Clinical Impressions(s) / ED Diagnoses   Final diagnoses:  Bipolar 1 disorder, depressed, severe [facility])    ED Discharge Orders    None       Joanne Gavel, PA-C 02/01/18 0725    Palumbo, April, MD 02/01/18 [phone]

## 2018-02-01 NOTE — Tx Team (Addendum)
Initial Treatment Plan 02/01/2018 6:53 PM [name] [id number]    PATIENT STRESSORS: Loss of signficiant other: death Substance abuse   PATIENT STRENGTHS: Average or above average intelligence Capable of independent living Financial means Religious Affiliation Supportive family/friends   PATIENT IDENTIFIED PROBLEMS: "get back on medications and regimen"  "sleep and eat again"  Substance Abuse  Depression               DISCHARGE CRITERIA:  Ability to meet basic life and health needs Adequate post-discharge living arrangements Improved stabilization in mood, thinking, and/or behavior Medical problems require only outpatient monitoring Motivation to continue treatment in a less acute level of care Need for constant or close observation no longer present Reduction of life-threatening or endangering symptoms to within safe limits Safe-care adequate arrangements made Verbal commitment to aftercare and medication compliance Withdrawal symptoms are absent or subacute and managed without 24-hour nursing intervention  PRELIMINARY DISCHARGE PLAN: Outpatient therapy  PATIENT/FAMILY INVOLVEMENT: This treatment plan has been presented to and reviewed with the patient, [name].  The patient and family have been given the opportunity to ask questions and make suggestions.  Annia Friendly, RN 02/01/2018, 4:10 PM

## 2018-02-01 NOTE — Progress Notes (Signed)
Patient ID: [name], female   DOB: [birth year], 42 y.o.   MRN: [id number]  Patient informed no active order for Subutex or Klonopin at this time. Patient became irate and began crying at the medication window. NP notified and 1 mg of [place] PO administered as ordered. Patient then began pacing the halls and approached nurse's station stating, "I was told before I signed in I could have my Subutex, otherwise I wouldn't have signed in". Patient stated, "if I can't have it then I want to leave right now, you are holding me against my will".  AC, Pharmacist, and MD notified. Case reviewed. Patient signed 72 hour request for discharge. Patient signed consent for staff to call [facility] and speak to patient's provider regarding Subutex. See pharmacy note. See new orders. Patient medicated as ordered. Patient did not attend dinner. Patient observed resting in bed in no acute distress. Will continue to monitor.

## 2018-02-01 NOTE — BH Assessment (Signed)
[facility] Assessment Progress Note   Per [facility], patient has a bed at [facility].  He said however that the oncoming daytime AC Ria Comment would need to coordinate which room and when patient would be coming over.

## 2018-02-01 NOTE — Progress Notes (Signed)
Patient requested that we speak to Vicente Males at [facility] at [phone] regarding subutex prescription.  Spoke directly to [employer], prescriber of Subutex, verified 8 mg TID dosing.  Dereck stated that he would restart 8mg  TID dosing even if patient had missed several days.  Vicente Males suggested we evaluate and determine appropriate treatment as he is unaware of this admission until receiving the phone call.  He stated that his information has her receiving Clonazepam 1 mg tid from a different prescriber.  He does not prescribe the clonazepam.  Patient was prescribed on June 18 a 30 [place] supply of Subutex 8 mg tid.     Einar Grad, [place] D

## 2018-02-01 NOTE — Progress Notes (Signed)
D    Pt is irritable and begging for suboxone   She said she should be discharged because she cant get it   She said it saved her life and she needs it and she doesn't want to detox from it   She talked about grieving for the loss of her husband who recently died      A    Verbal support given    Medications administered and effectiveness monitored   Q 15 min checks   Medications given for symptoms of withdrawal  R   Pt remains safe at present time

## 2018-02-01 NOTE — BH Assessment (Addendum)
[facility] Assessment Progress Note  Per Buford Dresser, DO, this pt requires psychiatric hospitalization at this time.  Leonia Reader, RN, [facility] has assigned pt to [facility] Rm 504-2; she will call when [facility] is ready to receive pt.  Pt has signed Voluntary Admission and Consent for Treatment, as well as Consent to Release Information to Vicente Males at [facility] [employer], and a notification call has been placed.  Signed forms have been faxed to [facility].  Pt's nurse has been notified, and agrees to send original paperwork along with pt via [place], and to call report to [phone].  [name], [place] [facility] Coordinator [phone]   Addendum:  Dimple Nanas calls, reported that [facility] is now ready to receive pt.  Pt's nurse has been notified.  [name], [place] Coordinator [phone]

## 2018-02-01 NOTE — ED Provider Notes (Signed)
Pt requesting klonopin. Reports takes 2 mg TID. Per review of records, I can only verify 1 mg TID. Ordered 1 mg based on this.    Virgel Manifold, MD 02/01/18 1120

## 2018-02-01 NOTE — Progress Notes (Signed)
Patient ID: [name], female   DOB: 1975-10-05, 42 y.o.   MRN: [id number]  Patient's last progress note faxed from [facility] and placed on front of chart.

## 2018-02-01 NOTE — ED Notes (Signed)
Following was checked into locker 27:  1 purse w/ cell phone, 1 pair of tennis shoes, 1 pair of socks, 1 brown bracelet w/ heart, 1 tooth brush, 1 brush, 1 can hair spray, 1 can mousse, 1 red make up bag, 1/2 bottle of suave conditioner, 1/2 bottle fragrance mist, 1 roller deodorant secret, 8 shirts, 6 pair pants.

## 2018-02-02 DIAGNOSIS — F1210 Cannabis abuse, uncomplicated: Secondary | ICD-10-CM

## 2018-02-02 DIAGNOSIS — F333 Major depressive disorder, recurrent, severe with psychotic symptoms: Principal | ICD-10-CM

## 2018-02-02 DIAGNOSIS — F1410 Cocaine abuse, uncomplicated: Secondary | ICD-10-CM

## 2018-02-02 DIAGNOSIS — R450 Nervousness: Secondary | ICD-10-CM

## 2018-02-02 DIAGNOSIS — F17210 Nicotine dependence, cigarettes, uncomplicated: Secondary | ICD-10-CM

## 2018-02-02 MED ORDER — CLONAZEPAM 0.5 MG PO TABS
0.5000 mg | ORAL_TABLET | Freq: Two times a day (BID) | ORAL | Status: DC | PRN
  Administered 2018-02-02 – 2018-02-03 (×2): 0.5 mg via ORAL
  Filled 2018-02-02 (×2): qty 1

## 2018-02-02 MED ORDER — GABAPENTIN 400 MG PO CAPS
400.0000 mg | ORAL_CAPSULE | Freq: Four times a day (QID) | ORAL | Status: DC
  Administered 2018-02-02 – 2018-02-03 (×3): 400 mg via ORAL
  Filled 2018-02-02 (×11): qty 1

## 2018-02-02 MED ORDER — BUPRENORPHINE HCL-NALOXONE HCL 8-2 MG SL SUBL
1.0000 | SUBLINGUAL_TABLET | Freq: Two times a day (BID) | SUBLINGUAL | Status: DC
  Administered 2018-02-03: 1 via SUBLINGUAL
  Filled 2018-02-02: qty 1

## 2018-02-02 MED ORDER — BUPRENORPHINE HCL-NALOXONE HCL 8-2 MG SL SUBL
1.0000 | SUBLINGUAL_TABLET | Freq: Once | SUBLINGUAL | Status: AC
  Administered 2018-02-02: 1 via SUBLINGUAL
  Filled 2018-02-02: qty 1

## 2018-02-02 MED ORDER — HYDROXYZINE HCL 25 MG PO TABS
25.0000 mg | ORAL_TABLET | Freq: Four times a day (QID) | ORAL | Status: DC | PRN
  Administered 2018-02-02 (×2): 25 mg via ORAL
  Filled 2018-02-02 (×2): qty 1
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
## 2018-02-02 NOTE — Progress Notes (Signed)
Pt invited, but declined NA group this evening.

## 2018-02-02 NOTE — BHH Suicide Risk Assessment (Signed)
[facility] INPATIENT:  Family/Significant Other Suicide Prevention Education  Suicide Prevention Education:  Patient Refusal for Family/Significant Other Suicide Prevention Education: The patient [name] has refused to provide written consent for family/significant other to be provided Family/Significant Other Suicide Prevention Education during admission and/or prior to discharge.  Physician notified.  SPE completed with pt, as pt refused to consent to family contact. SPI pamphlet provided to pt and pt was encouraged to share information with support network, ask questions, and talk about any concerns relating to SPE. Pt denies access to guns/firearms and verbalized understanding of information provided. Mobile Crisis information also provided to pt.   Avelina Laine LCSW 02/02/2018, 3:46 PM
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
## 2018-02-02 NOTE — Tx Team (Addendum)
Interdisciplinary Treatment and Diagnostic Plan Update  02/02/2018 Time of Session: [id number] [name] MRN: [id number]  Principal Diagnosis: MDD, recurrent, severe with psychotic features  Secondary Diagnoses: Active Problems:   Major depressive disorder, recurrent, severe with psychotic features ([place])   Current Medications:  Current Facility-Administered Medications  Medication Dose Route Frequency Provider Last Rate Last Dose  . acetaminophen (TYLENOL) tablet 650 mg  650 mg Oral Q4H PRN Patrecia Pour, NP      . alum & mag hydroxide-simeth (MAALOX/MYLANTA) 200-200-20 MG/5ML suspension 30 mL  30 mL Oral Q4H PRN Patrecia Pour, NP      . clonazePAM Bobbye [place]) tablet 1 mg  1 mg Oral Q8H Cobos, Myer Peer, MD   1 mg at 02/02/18 0306  . cloNIDine (CATAPRES) tablet 0.1 mg  0.1 mg Oral QID Cobos, Myer Peer, MD   0.1 mg at 02/01/18 2050   Followed by  . [START ON 02/04/2018] cloNIDine (CATAPRES) tablet 0.1 mg  0.1 mg Oral BH-qamhs Cobos, Myer Peer, MD       Followed by  . [START ON 02/06/2018] cloNIDine (CATAPRES) tablet 0.1 mg  0.1 mg Oral QAC breakfast Cobos, Fernando A, MD      . dicyclomine (BENTYL) tablet 20 mg  20 mg Oral Q6H PRN Cobos, Fernando A, MD      . DULoxetine (CYMBALTA) DR capsule 60 mg  60 mg Oral BID Patrecia Pour, NP   60 mg at 02/01/18 1807  . gabapentin (NEURONTIN) capsule 800 mg  800 mg Oral QID Patrecia Pour, NP   800 mg at 02/01/18 2050  . hydrOXYzine (ATARAX/VISTARIL) tablet 25 mg  25 mg Oral Q6H PRN Lindell Spar I, NP   25 mg at 02/02/18 0306  . levothyroxine (SYNTHROID, LEVOTHROID) tablet 75 mcg  75 mcg Oral QAC breakfast Patrecia Pour, NP      . loperamide (IMODIUM) capsule 2-4 mg  2-4 mg Oral PRN Lindell Spar I, NP      . magnesium hydroxide (MILK OF MAGNESIA) suspension 30 mL  30 mL Oral Daily PRN Patrecia Pour, NP      . methocarbamol (ROBAXIN) tablet 500 mg  500 mg Oral Q8H PRN Cobos, Myer Peer, MD      . multivitamin with minerals tablet 1 tablet   1 tablet Oral Daily Lindell Spar I, NP   1 tablet at 02/01/18 1808  . naproxen (NAPROSYN) tablet 500 mg  500 mg Oral BID PRN Cobos, Myer Peer, MD      . nicotine (NICODERM CQ - dosed in mg/24 hours) patch 21 mg  21 mg Transdermal Daily Patrecia Pour, NP      . ondansetron (ZOFRAN-ODT) disintegrating tablet 4 mg  4 mg Oral Q6H PRN Lindell Spar I, NP   4 mg at 02/02/18 0307  . thiamine (B-1) injection 100 mg  100 mg Intramuscular Once Nwoko, Herbert Pun I, NP      . thiamine (VITAMIN B-1) tablet 100 mg  100 mg Oral Daily Nwoko, Agnes I, NP       PTA Medications: Medications Prior to Admission  Medication Sig Dispense Refill Last Dose  . DULoxetine (CYMBALTA) 60 MG capsule Take 60 mg by mouth 2 (two) times daily.    Past Week at Unknown time  . gabapentin (NEURONTIN) 800 MG tablet Take 800 mg by mouth 4 (four) times daily.   Past Week at Unknown time  . levothyroxine (SYNTHROID, LEVOTHROID) 75 MCG tablet Take 1 tablet (75 mcg total)  by mouth daily before breakfast. 90 tablet 0 Past Week at Unknown time    Patient Stressors: Loss of signficiant other: death Substance abuse  Patient Strengths: Average or above average intelligence Capable of independent living Financial means Religious Affiliation Supportive family/friends  Treatment Modalities: Medication Management, Group therapy, Case management,  1 to 1 session with clinician, Psychoeducation, Recreational therapy.   Physician Treatment Plan for Primary Diagnosis: MDD, recurrent, severe with psychotic features  Medication Management: Evaluate patient's response, side effects, and tolerance of medication regimen.  Therapeutic Interventions: 1 to 1 sessions, Unit Group sessions and Medication administration.  Evaluation of Outcomes: Not Met  Physician Treatment Plan for Secondary Diagnosis: Active Problems:   Major depressive disorder, recurrent, severe with psychotic features ([place])  Medication Management: Evaluate patient's  response, side effects, and tolerance of medication regimen.  Therapeutic Interventions: 1 to 1 sessions, Unit Group sessions and Medication administration.  Evaluation of Outcomes: Not Met   RN Treatment Plan for Primary Diagnosis:MDD, recurrent, severe with psychotic features Long Term Goal(s): Knowledge of disease and therapeutic regimen to maintain health will improve  Short Term Goals: Ability to remain free from injury will improve, Ability to verbalize frustration and anger appropriately will improve, Ability to demonstrate self-control and Ability to participate in decision making will improve  Medication Management: RN will administer medications as ordered by provider, will assess and evaluate patient's response and provide education to patient for prescribed medication. RN will report any adverse and/or side effects to prescribing provider.  Therapeutic Interventions: 1 on 1 counseling sessions, Psychoeducation, Medication administration, Evaluate responses to treatment, Monitor vital signs and CBGs as ordered, Perform/monitor CIWA, COWS, AIMS and Fall Risk screenings as ordered, Perform wound care treatments as ordered.  Evaluation of Outcomes: Not Met   LCSW Treatment Plan for Primary Diagnosis: MDD, recurrent, severe with psychotic features Long Term Goal(s): Safe transition to appropriate next level of care at discharge, Engage patient in therapeutic group addressing interpersonal concerns.  Short Term Goals: Engage patient in aftercare planning with referrals and resources, Increase social support, Increase emotional regulation and Facilitate patient progression through stages of change regarding substance use diagnoses and concerns  Therapeutic Interventions: Assess for all discharge needs, 1 to 1 time with Social worker, Explore available resources and support systems, Assess for adequacy in community support network, Educate family and significant other(s) on suicide  prevention, Complete Psychosocial Assessment, Interpersonal group therapy.  Evaluation of Outcomes: Not Met   Progress in Treatment: Attending groups: No.  New to unit. Continuing to assess. Participating in groups: No. Taking medication as prescribed: Yes. Toleration medication: Yes. Family/Significant other contact made: No, will contact:  family member if pt consents to collateral contact.  Patient understands diagnosis: Yes. Discussing patient identified problems/goals with staff: Yes. Medical problems stabilized or resolved: Yes. Denies suicidal/homicidal ideation: Yes. Issues/concerns per patient self-inventory: No.  Other: n/a   New problem(s) identified: No, Describe:  n/a  New Short Term/Long Term Goal(s): detox, medication management for mood stabilization; elimination of SI thoughts; development of comprehensive mental wellness/sobriety plan.   Patient Goals:  "to get back on my medications."  Discharge Plan or Barriers: CSW assessing for appropriate referrals. Pt sees Derek at [facility] for therapy. She has been off medications for the past 3 days. [place] pamphlet, Hospice Grief counseling information, Mobile Crisis information, and AA/NA information provided to patient for additional community support and resources.   Reason for Continuation of Hospitalization: Anxiety Depression Medication stabilization Suicidal ideation  Estimated Length  of Stay: Monday, 02/07/18  Attendees: Patient: [name] 02/02/2018 8:15 AM  Physician: Dr. Parke Poisson MD; Dr. Leverne Humbles MD 02/02/2018 8:15 AM  Nursing: Yetta Flock RN; Santiago Glad RN 02/02/2018 8:15 AM  RN Care Manager:x 02/02/2018 8:15 AM  Social Worker: Janice Norrie LCSW 02/02/2018 8:15 AM  Recreational Therapist: x 02/02/2018 8:15 AM  Other: Lindell Spar NP; Benjamine Mola NP 02/02/2018 8:15 AM  Other:  02/02/2018 8:15 AM  Other: 02/02/2018 8:15 AM    Scribe for Treatment Team: Avelina Laine, LCSW 02/02/2018 8:15 AM

## 2018-02-02 NOTE — Progress Notes (Signed)
Recreation Therapy Notes  Date: 7.10.19 Time: 0930 Location: [address] Dayroom  Group Topic: Stress Management  Goal Area(s) Addresses:  Patient will verbalize importance of using healthy stress management.  Patient will identify positive emotions associated with healthy stress management.   Intervention: Stress Management  Activity :  Guided Imagery.  LRT introduced the stress management technique of guided imagery.  LRT read a script to allow patients to visualize being outside on a bright summer [place].  Patients were to follow along as script was read.  Education:  Stress Management, Discharge Planning.   Education Outcome: Acknowledges edcuation/In group clarification offered/Needs additional education  Clinical Observations/Feedback: Pt did not attend group.    Victorino Sparrow, LRT/CTRS         Ria Comment, Maryella Abood A 02/02/2018 11:56 AM
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
## 2018-02-02 NOTE — Progress Notes (Signed)
[place] Group Notes:  (Nursing/MHT/Case Management/Adjunct)  Date:  02/02/2018  Time:  4:00 PM  Type of Therapy:  Group Therapy  Participation Level:  Active  Participation Quality:  Appropriate, Sharing and Supportive  Affect:  Appropriate and Excited  Cognitive:  Appropriate  Insight:  Appropriate and Improving  Engagement in Group:  Developing/Improving, Improving and Supportive  Modes of Intervention:  Activity and Socialization  Summary of Progress/Problems: Patient was very active in group therapy today. Patient is improving on insight and attitude.   Megan Mans 02/02/2018, 5:52 PM

## 2018-02-02 NOTE — B[facility] Group Notes (Signed)
LCSW Group Therapy Note 02/02/2018 2:44 PM  Type of Therapy and Topic: Group Therapy: Overcoming Obstacles  Participation Level: Active  Description of Group:  In this group patients will be encouraged to explore what they see as obstacles to their own wellness and recovery. They will be guided to discuss their thoughts, feelings, and behaviors related to these obstacles. The group will process together ways to cope with barriers, with attention given to specific choices patients can make. Each patient will be challenged to identify changes they are motivated to make in order to overcome their obstacles. This group will be process-oriented, with patients participating in exploration of their own experiences as well as giving and receiving support and challenge from other group members.  Therapeutic Goals: 1. Patient will identify personal and current obstacles as they relate to admission. 2. Patient will identify barriers that currently interfere with their wellness or overcoming obstacles.  3. Patient will identify feelings, thought process and behaviors related to these barriers. 4. Patient will identify two changes they are willing to make to overcome these obstacles:   Summary of Patient Progress  [name] was engaged and participated throughout the group session. [name] reports that her main obstacle is the death of her husband and the grieving process. [name] reports that she plans to overcome this by going to grief therapy and building a healthy support system.    Therapeutic Modalities:  Cognitive Behavioral Therapy Solution Focused Therapy Motivational Interviewing Relapse Prevention Therapy   Theresa Duty Clinical Social Worker

## 2018-02-02 NOTE — Plan of Care (Signed)
D: After [name] met with MD and SW this a.m, her mood brightened and she was calmer. She has been eager to restart buprenorphine and has asked this Probation officer when that will happen. She has been pleasant and cooperative, denying SI/HI/AVH. Other than concerns about Suboxone and Ativan, she has denied concerns, questions, and needs. She is grieving the loss of her significant other earlier this month and says being here has helped her get away from dealing with people.  A: Meds given as ordered. Q15 safety checks maintained. Support/encouragement offered.  R: Pt remains free from harm and continues with treatment. Will continue to monitor for needs/safety.   Problem: Education: Goal: Emotional status will improve Outcome: Progressing   Problem: Coping: Goal: Ability to demonstrate self-control will improve Outcome: Progressing   Problem: Safety: Goal: Periods of time without injury will increase Outcome: Progressing   Problem: Safety: Goal: Ability to remain free from injury will improve Outcome: Progressing

## 2018-02-03 ENCOUNTER — Encounter (HOSPITAL_COMMUNITY): Payer: Self-pay | Admitting: Behavioral Health

## 2018-02-03 LAB — BASIC METABOLIC PANEL
Anion gap: 6 (ref 5–15)
BUN: 13 mg/dL (ref 6–20)
CALCIUM: 8.9 mg/dL (ref 8.9–10.3)
CO2: 31 mmol/L (ref 22–32)
CREATININE: 1.13 mg/dL — AB (ref 0.44–1.00)
Chloride: 106 mmol/L (ref 98–111)
GFR calc non Af Amer: 59 mL/min — ABNORMAL LOW (ref >60)
Glucose, Bld: 103 mg/dL — ABNORMAL HIGH (ref 70–99)
Potassium: 4.5 mmol/L (ref 3.5–5.1)
SODIUM: 143 mmol/L (ref 135–145)

## 2018-02-03 MED ORDER — HYDROXYZINE HCL 25 MG PO TABS: 25.0000 mg | ORAL_TABLET | Freq: Four times a day (QID) | ORAL | 0 refills | Status: DC | PRN

## 2018-02-03 MED ORDER — DULOXETINE HCL 60 MG PO CPEP: 60.0000 mg | ORAL_CAPSULE | Freq: Two times a day (BID) | ORAL | 0 refills | Status: DC

## 2018-02-03 MED ORDER — GABAPENTIN 400 MG PO CAPS: 400.0000 mg | ORAL_CAPSULE | Freq: Four times a day (QID) | ORAL | 0 refills | Status: DC

## 2018-02-03 NOTE — Discharge Summary (Addendum)
Physician Discharge Summary Note  Patient:  [name] is an 42 y.o., female MRN:  [id number] DOB:  June 10, 1976 Patient phone:  [phone] (home)  Patient address:   [address] [place] [address],  Total Time spent with patient: 30 minutes  Date of Admission:  02/01/2018 Date of Discharge: 02/03/2018  Reason for Admission:  increased depression, anxiety, some passive thoughts of death, substance abuse      Principal Problem: <principal problem not specified> Discharge Diagnoses: Patient Active Problem List   Diagnosis Date Noted  . Major depressive disorder, recurrent episode, severe, with psychosis ([place]) [F33.3] 02/01/2018  . Major depressive disorder, recurrent, severe with psychotic features ([place]) [F33.3] 02/01/2018  . ETOH abuse [F10.10] 07/18/2014  . Alcoholic pancreatitis [I29.79] 07/18/2014  . Drug-seeking behavior [Z76.5] 07/18/2014  . Acute pancreatitis [K85.90] 07/16/2014  . Alcohol intoxication in active alcoholic without complication ([place]) [G92.119]   . Depression [F32.9]   . Polysubstance dependence including opioid type drug, episodic abuse ([place]) [F11.20, F19.20] 06/26/2014  . Substance induced mood disorder ([place]) [F19.94] 05/03/2014  . Cocaine abuse with intoxication with perceptual disturbance ([place]) [F14.122] 04/12/2014  . Alcohol dependence with uncomplicated withdrawal ([place]) [F10.230] 04/03/2014  . Alcohol dependence with withdrawal with complication ([place]) [E17.408] 04/02/2014  . Herpes labialis [B00.1] 10/08/2013  . Lumbar radiculopathy, chronic [M54.16] 10/08/2013  . Nicotine addiction [F17.200] 07/30/2012  . Fall down stairs [W10.8XXA] 05/27/2012  . Foot fracture [S92.909A] 05/27/2012  . Obesity, Class III, BMI 40-49.9 (morbid obesity) ([place]) [E66.01] 10/31/2011  . Mood disorder ([place]) [F39] 10/31/2011  . Hypothyroidism [E03.9] 10/31/2011  . Obstructive sleep apnea [G47.33] 10/31/2011  . Narcotic abuse in remission ([place]) [F11.11]   . Chronic  back pain [M54.9, G89.29]     Past Psychiatric History: Denies history of prior suicide attempts, denies history of self cutting or self-injurious behaviors, reports history of depression following losses or stressors, describes history of panic attacks but no clear agoraphobia, does not endorse history of psychosis. History of prior psychiatric admissions ( 2015) for substance/alcohol use disorder  Substance Abuse History in the last 12 months: History of substance abuse, describes opiates as substance of choice, reports she has been abstinent/sober from illicit opiates for years) 3 years close (following Suboxone agonist therapy).  Reports recent relapse on cocaine but states she only used x1.  Denies recent alcohol abuse. Consequences of Substance Abuse: Denies recent negative consequences , states she has been functioning well in daily activities while on Suboxone. Previous Psychotropic Medications: Suboxone 8/2 mgrs TID, Cymbalta 60 mgrs QDAY , Klonopin 1 mgr TID- states was recently increased to 2 mgrs TID following death of her SO a week ago, but states she normally takes Klonopin 2-3 x per week only . Of note,  buprenorphine management/dose has been confirmed with our pharmacist/registry.    Past Medical History:  Past Medical History:  Diagnosis Date  . Alcohol abuse   . Anemia   . Anxiety   . Arthritis    knees bilateral   DJD  stenosis   . Blood dyscrasia    HSV  . Cancer ([place])    vaginal  malignant carcinoma  . Chronic back pain   . Complication of anesthesia    panic attack, fight or flight  . Drug-seeking behavior   . Endometriosis   . Fibroids   . Fibromyalgia   . Foot fracture    bilateral and toes  . GERD (gastroesophageal reflux disease)    history of  . Headache(784.0)  history of  . Hypothyroidism   . Neuromuscular disorder (H[facility])    neuropathy  . PCOS (polycystic ovarian syndrome)   . Polysubstance abuse ([place])   . PONV (postoperative nausea and  vomiting)   . Ruptured lumbar disc   . Sleep apnea    cpap  . Thyroid disease     Past Surgical History:  Procedure Laterality Date  . ADENOIDECTOMY    . BACK SURGERY     6 times  . CESAREAN SECTION    . DILITATION & CURRETTAGE/HYSTROSCOPY WITH NOVASURE ABLATION N/A 04/28/2017   Procedure: DILATATION & CURETTAGE/HYSTEROSCOPY WITH NOVASURE ABLATION;  Surgeon: Brien Few, MD;  Location: [facility];  Service: Gynecology;  Laterality: N/A;  . TONSILLECTOMY    . TUBAL LIGATION     Family History:  Family History  Problem Relation Age of Onset  . Arthritis Mother   . Clotting disorder Mother   . Diabetes Mother   . Hypertension Mother   . Hyperlipidemia Father   . Heart disease Father   . Prostate cancer Father   . Leukemia Maternal Grandmother   . Diabetes Maternal Grandfather   . Stroke Paternal Grandmother   . Heart disease Paternal Grandmother    Family Psychiatric  History: Reports mother has history of anxiety, no suicides in family   Social History:  Social History   Substance and Sexual Activity  Alcohol Use No   Comment: denies alcohol since 12/16     Social History   Substance and Sexual Activity  Drug Use Yes  . Types: Cocaine, Amphetamines, Marijuana, Benzodiazepines   Comment: narcotic abuse    Social History   Socioeconomic History  . Marital status: Divorced    Spouse name: Not on file  . Number of children: Not on file  . Years of education: Not on file  . Highest education level: Not on file  Occupational History  . Occupation: disability    Comment: Engineer, technical sales at substance abuse counselor at [employer]  . Financial resource strain: Not on file  . Food insecurity:    Worry: Not on file    Inability: Not on file  . Transportation needs:    Medical: Not on file    Non-medical: Not on file  Tobacco Use  . Smoking status: Current Every Schicker Smoker    Packs/Santelli: 1.00    Years: 20.00    Pack years: 20.00    Types:  Cigarettes  . Smokeless tobacco: Never Used  Substance and Sexual Activity  . Alcohol use: No    Comment: denies alcohol since 12/16  . Drug use: Yes    Types: Cocaine, Amphetamines, Marijuana, Benzodiazepines    Comment: narcotic abuse  . Sexual activity: Yes    Birth control/protection: Surgical  Lifestyle  . Physical activity:    Days per week: Not on file    Minutes per session: Not on file  . Stress: Not on file  Relationships  . Social connections:    Talks on phone: Not on file    Gets together: Not on file    Attends religious service: Not on file    Active member of club or organization: Not on file    Attends meetings of clubs or organizations: Not on file    Relationship status: Not on file  Other Topics Concern  . Not on file  Social History Narrative  . Not on file    Hospital Course:  42 year old female, presented to hospital voluntarily.  Reports her significant other died unexpectedly about a week ago, resulting in increased depression, anxiety, some passive thoughts of death such as wishing to fall asleep and not wake up.  She also states she relapsed on cocaine x1 [place].  Patient reports a history of substance abuse-describes opiates as substance of choice, states she has been sober for 3 years on Suboxone outpatient management.  Reports " it has been a life saver for me".  As above, states she did relapse on cocaine x1.  Of note, admission BAL is negative, admission UDS is positive for Cocaine, BZD, Amphetamines, Cannabis.  Currently describes some neurovegetative symptoms of depression but states that they seem to be improving.  States that she slept better last night, describes normal appetite.  Today denies psychotic symptoms. Patient states sheessentially felt she was at high risk of a full and severe relapse following recent loss of loved one and use of cocaine x 1, and felt she needed to seek inpatient care to stabilize .    After the above admission  assessment and during this hospital course, patients presenting symptoms were identified. Labs were reviewed and BAL is negative, admission UDS is positive for Cocaine, BZD, Amphetamines, Cannabis. BMP showed glucose of 103 and Creatine, Ser of 1.13 otherwise normal. CBC normal, Ethanol showed no signs of toxicity. Detoxification treatments administered as approproiate. Patient was treated and discharged with the following medications;   Klonopin 0.5 mg twice daily as needed severe anxiety Suboxone management/maintenance 8/2 mgrs SL BID Cymbalta 60 mgrs QDAY for depression, anxiety Neurontin to 400 mgrs QID (decreased to minimize potential sedation).  Patient tolerated her treatment regimen without any adverse effects reported. She remained compliant with therapeutic milieu and actively participated in group counseling sessions. AA/NA meetings were offered & held on the unit with patient active participation.   During the course of her hospitalization, patient initially  for irrigable and highly focused on restarting buprenorphine and Ativan. At times she stated that she wanted to be discharged if she could not received the medication. Patient signed 72 hour request for discharge. To note;Per pharmacists,  "Patient requested that we speak to Vicente Males at [facility] at .[phone] regarding subutex prescription.  Spoke directly to [employer], prescriber of Subutex, verified 8 mg TID dosing.  Dereck stated that he would restart 8mg  TID dosing even if patient had missed several days.  Vicente Males suggested we evaluate and determine appropriate treatment as he is unaware of this admission until receiving the phone call.  He stated that his information has her receiving Clonazepam 1 mg tid from a different prescriber.  He does not prescribe the clonazepam.  Patient was prescribed on June 18 a 30 [employer] supply of Subutex 8 mg tid". Patient was provided with Suboxone while on the unit. She was advised that if this  medication is to continue, she should follow-up with her former prescriber.     Upon discharge, Caily denied any SI/HI, AVH, delusional thoughts, or paranoia. She endorsed overall improvement in anxiety. She denied  any substance withdrawal symptoms.  Prior to discharge, Querida's case was presented during treatment team meeting this morning. The team members were all in agreement that she was both mentally & medically stable to be discharged to continue mental health care on an outpatient basis as noted below. She was provided with all the necessary information needed to make this appointment without problems. She was provided with prescriptions  of her [facility] discharge medications to be taken to her phamacy. She  left [facility] with all personal belongings in no apparent distress. Transportation per patients arrangement.  Physical Findings: AIMS: Facial and Oral Movements Muscles of Facial Expression: None, normal Lips and Perioral Area: None, normal Jaw: None, normal Tongue: None, normal,Extremity Movements Upper (arms, wrists, hands, fingers): None, normal Lower (legs, knees, ankles, toes): None, normal, Trunk Movements Neck, shoulders, hips: None, normal, Overall Severity Severity of abnormal movements (highest score from questions above): None, normal Incapacitation due to abnormal movements: None, normal Patient's awareness of abnormal movements (rate only patient's report): No Awareness, Dental Status Current problems with teeth and/or dentures?: No Does patient usually wear dentures?: No  CIWA:  CIWA-Ar Total: 6 COWS:  COWS Total Score: 1  Musculoskeletal: Strength & Muscle Tone: within normal limits Gait & Station: normal Patient leans: N/A  Psychiatric Specialty Exam: SEE SRA BY MD  Physical Exam  Nursing note and vitals reviewed. Constitutional: She is oriented to person, place, and time.  Neurological: She is alert and oriented to person, place, and time.    Review of Systems   Psychiatric/Behavioral: Positive for substance abuse. Negative for hallucinations, memory loss and suicidal ideas. Depression: improved. The patient does not have insomnia. Nervous/anxious: improved.   All other systems reviewed and are negative.   Blood pressure (!) 121/92, pulse 85, temperature 98.6 F (37 C), resp. rate 18, height 5\' 6"  (1.676 m), weight 135.2 kg (298 lb), SpO2 100 %.Body mass index is 48.1 kg/m.    Have you used any form of tobacco in the last 30 days? (Cigarettes, Smokeless Tobacco, Cigars, and/or Pipes): Yes  Has this patient used any form of tobacco in the last 30 days? (Cigarettes, Smokeless Tobacco, Cigars, and/or Pipes)  Yes, A prescription for an FDA-approved tobacco cessation medication was offered at discharge and the patient refused  Blood Alcohol level:  Lab Results  Component Value Date   [facility] <10 02/01/2018   ETH <5 35/59/7416    Metabolic Disorder Labs:  Lab Results  Component Value Date   HGBA1C 5.3 07/17/2014   MPG 105 07/17/2014   MPG 111 05/03/2014   No results found for: PROLACTIN Lab Results  Component Value Date   CHOL 235 (H) 05/03/2014   TRIG 477 (H) 05/03/2014   HDL 59 05/03/2014   CHOLHDL 4.0 05/03/2014   VLDL UNABLE TO CALCULATE IF TRIGLYCERIDE OVER 400 mg/dL 05/03/2014   LDLCALC UNABLE TO CALCULATE IF TRIGLYCERIDE OVER 400 mg/dL 05/03/2014    See Psychiatric Specialty Exam and Suicide Risk Assessment completed by Attending Physician prior to discharge.  Discharge destination:  Home  Is patient on multiple antipsychotic therapies at discharge:  No   Has Patient had three or more failed trials of antipsychotic monotherapy by history:  No  Recommended Plan for Multiple Antipsychotic Therapies: NA   Allergies as of 02/03/2018      Reactions   Moxifloxacin Hives   Quinolones Hives   Hives with moxifloxacin and levofloxacin      Medication List    STOP taking these medications   gabapentin 800 MG tablet Commonly known  as:  NEURONTIN Replaced by:  gabapentin 400 MG capsule     TAKE these medications     Indication  DULoxetine 60 MG capsule Commonly known as:  CYMBALTA Take 1 capsule (60 mg total) by mouth 2 (two) times daily.  Indication:  Major Depressive Disorder   gabapentin 400 MG capsule Commonly known as:  NEURONTIN Take 1 capsule (400 mg total) by mouth 4 (four) times daily. Replaces:  gabapentin 800 MG tablet  Indication:  mood/anxiety   hydrOXYzine 25 MG tablet Commonly known as:  ATARAX/VISTARIL Take 1 tablet (25 mg total) by mouth every 6 (six) hours as needed (mild/moderate anxiety).  Indication:  Feeling Anxious   levothyroxine 75 MCG tablet Commonly known as:  SYNTHROID, LEVOTHROID Take 1 tablet (75 mcg total) by mouth daily before breakfast.  Indication:  Underactive Thyroid      Follow-up Information    [facility] Follow up on 02/08/2018.   Why:  Hospital follow-up with Dr. Nancy Fetter on Tuesday, 7/16 at 2:00PM. Thank you.  Contact information: ATTN: Dr. Nancy Fetter [address] [facility] [address].  [place], [place] [address] Phone: [phone] Fax: [phone]       [facility] Follow up on 02/08/2018.   Why:  Hospital follow-up on Tuesday, 7/16 at 9:45AM with Vicente Males. Please ask about bereavement counseling during this appt. Thank you. Contact information: [address] [place], [place] [address] Phone: [phone] Fax: [phone]          Follow-up recommendations: Follow up with your outpatient provided for any medical issues. Activity & diet as recommended by your primary care provider.  Comments:  Patient is instructed prior to discharge to: Take all medications as prescribed by his/her mental healthcare provider. Report any adverse effects and or reactions from the medicines to his/her outpatient provider promptly. Patient has been instructed & cautioned: To not engage in alcohol and or illegal drug use while on prescription medicines. In the  event of worsening symptoms, patient is instructed to call the crisis hotline, 911 and or go to the nearest ED for appropriate evaluation and treatment of symptoms. To follow-up with his/her primary care provider for your other medical issues, concerns and or health care needs.  Signed: Mordecai Maes, NP 02/03/2018, 8:33 AM   Patient seen, Suicide Assessment Completed.  Disposition Plan Reviewed

## 2018-02-03 NOTE — Progress Notes (Addendum)
Patient ID: [name], female   DOB: 29-Apr-1976, 42 y.o.   MRN: [id number]  Discharge Note  D) Patient discharged to lobby. Patient states readiness for discharge. Patient denies SI/HI, AVH and is not delusional or psychotic. Patient in no acute distress.   A) Written and verbal discharge instructions given to the patient. Patient accepting to information and verbalized understanding. Patient agrees to the discharge plan. Opportunity for questions and concerns presented to patient. Patient denied any further questions or concerns. All belongings returned to patient. Patient signed for return of belongings and discharge paperwork.  R) Patient safely escorted to the lobby. [facility], personal belongings, follow-up appointment in place and discharge paperwork.

## 2018-02-03 NOTE — Progress Notes (Signed)
  [facility] Adult Case Management Discharge Plan :  Will you be returning to the same living situation after discharge:  Yes,  home At discharge, do you have transportation home?: Yes,  friend  Do you have the ability to pay for your medications: Yes,  magellan  Release of information consent forms completed and submitted to medical records by CSW.  Patient to Follow up at: Follow-up [facility] Follow up on 02/08/2018.   Why:  Hospital follow-up with Dr. Nancy Fetter on Tuesday, 7/16 at 2:00PM. Thank you.  Contact information: ATTN: Dr. Nancy Fetter [address] [facility] [address].  [place], [place] [address] Phone: [phone] Fax: [phone]       [facility] Follow up on 02/08/2018.   Why:  Hospital follow-up on Tuesday, 7/16 at 9:45AM with Vicente Males. Please ask about bereavement counseling during this appt. Thank you. Contact information: [address] [place], [place] [address] Phone: [phone] Fax: [phone]          Next level of care provider has access to [place] and Suicide Prevention discussed: Yes,  SPE completed with pt; pt declined to consent to collateral contact. SPI pamphlet and Mobile Crisis information provided.   Have you used any form of tobacco in the last 30 days? (Cigarettes, Smokeless Tobacco, Cigars, and/or Pipes): Yes  Has patient been referred to the Quitline?: Patient refused referral  Patient has been referred for addiction treatment: Yes  Avelina Laine, LCSW 02/03/2018, 8:36 AM

## 2018-02-03 NOTE — Progress Notes (Signed)
Patient ID: [name], female   DOB: March 14, 1976, 42 y.o.   MRN: [id number]  Nursing Progress Note [id number]  Data: Patient presents calm, pleasant and cooperative this morning. On initial approach, patient was observed resting in bed in no acute distress. Patient got up for her morning medications and requested PRN Klonopin. Patient informed she was given Klonopin at [phone]. Patient states, "oh yeah I forgot". Patient complaint with scheduled medications but did continue to refuse her synthroid stating, "it doesn't matter, I'll take it at home". Patient denies pain/physical complaints. Patient provided but declined to complete their self-inventory sheet. Patient is seen resting in bed and is isolative to her room. Patient currently denies SI/HI/AVH.   Action: Patient educated about and provided medication per provider's orders. Patient safety maintained with q15 min safety checks and frequent rounding. Low fall risk precautions in place. Emotional support given. 1:1 interaction and active listening provided. Patient encouraged to attend meals and groups. Patient encouraged to work on treatment plan and goals. Labs, vital signs and patient behavior monitored throughout shift.   Response: Patient agrees to come to staff if any thoughts of SI/HI develop or if patient develops intention of acting on thoughts. Patient remains safe on the unit at this time. Patient is interacting with peers appropriately on the unit. Will continue to support and monitor.

## 2018-02-03 NOTE — BHH Suicide Risk Assessment (Addendum)
[facility] Discharge Suicide Risk Assessment   Principal Problem: depression Discharge Diagnoses:  Patient Active Problem List   Diagnosis Date Noted  . Major depressive disorder, recurrent episode, severe, with psychosis ([place]) [F33.3] 02/01/2018  . Major depressive disorder, recurrent, severe with psychotic features ([place]) [F33.3] 02/01/2018  . ETOH abuse [F10.10] 07/18/2014  . Alcoholic pancreatitis [N05.39] 07/18/2014  . Drug-seeking behavior [Z76.5] 07/18/2014  . Acute pancreatitis [K85.90] 07/16/2014  . Alcohol intoxication in active alcoholic without complication ([place]) [J67.341]   . Depression [F32.9]   . Polysubstance dependence including opioid type drug, episodic abuse ([place]) [F11.20, F19.20] 06/26/2014  . Substance induced mood disorder ([place]) [F19.94] 05/03/2014  . Cocaine abuse with intoxication with perceptual disturbance ([place]) [F14.122] 04/12/2014  . Alcohol dependence with uncomplicated withdrawal ([place]) [F10.230] 04/03/2014  . Alcohol dependence with withdrawal with complication ([place]) [P37.902] 04/02/2014  . Herpes labialis [B00.1] 10/08/2013  . Lumbar radiculopathy, chronic [M54.16] 10/08/2013  . Nicotine addiction [F17.200] 07/30/2012  . Fall down stairs [W10.8XXA] 05/27/2012  . Foot fracture [S92.909A] 05/27/2012  . Obesity, Class III, BMI 40-49.9 (morbid obesity) ([place]) [E66.01] 10/31/2011  . Mood disorder ([place]) [F39] 10/31/2011  . Hypothyroidism [E03.9] 10/31/2011  . Obstructive sleep apnea [G47.33] 10/31/2011  . Narcotic abuse in remission ([place]) [F11.11]   . Chronic back pain [M54.9, G89.29]     Total Time spent with patient: 30 minutes  Musculoskeletal: Strength & Muscle Tone: within normal limits Gait & Station: normal Patient leans: N/A  Psychiatric Specialty Exam: ROS no chest pain, no shortness of breath, no vomiting, no fever, no chills   Blood pressure (!) 95/50, pulse 79, temperature 98.6 F (37 C), resp. rate 18, height 5\' 6"  (1.676 m), weight 135.2 kg  (298 lb), SpO2 100 %.Body mass index is 48.1 kg/m.  General Appearance: improved grooming   Eye Contact::  Good  Speech:  Normal Rate409  Volume:  Normal  Mood:  improving, states " I am still sad but I do feel a lot better"  Affect:  appropriate, more reactive  Thought Process:  Linear and Descriptions of Associations: Intact  Orientation:  Other:  fully alert and attentive  Thought Content:  no hallucinations,no delusions, not internally preoccupied   Suicidal Thoughts:  No- denies suicidal or self injurious ideations  Homicidal Thoughts:  No denies any homicidal or violent ideations  Memory:  recent and remote grossly intact   Judgement:  Other:  improving  Insight:  Fair- improving   Psychomotor Activity:  Normal  Concentration:  Good  Recall:  Good  Fund of Knowledge:Good  Language: Good  Akathisia:  Negative  Handed:  Right  AIMS (if indicated):     Assets:  Communication Skills Desire for Improvement Resilience  Sleep:  Number of Hours: 5.75  Cognition: WNL  ADL's:  Intact   Mental Status Per Nursing Assessment::   On Admission:  Suicidal ideation indicated by patient  Demographic Factors:  42 year old female, has one daughter age 87, lives with daughter, on disability,recently widowed   Loss Factors: Reports husband died 01-30-23   Historical Factors: Denies history of suicide attempts, history of depression, history of opiate dependence , in remission ( on Suboxone )  Risk Reduction Factors:   Responsible for children under 38 years of age, Sense of responsibility to family, Living with another person, especially a relative and Positive coping skills or problem solving skills  Continued Clinical Symptoms:  At this time patient reports feeling better, less severely depressed, affect presents fuller in range, no  thought disorder, no suicidal or self injurious ideations and identifies love for her daughter as a protective factor against suicide, states " I know I have  to be there for my girl". Denies hallucinations, no delusions, not internally preoccupied, future oriented. Denies medication side effects. Behavior on unit in good control. As noted in prior notes, patient reports she has been on Suboxone management , prescribed at [facility] program. Plans to continue Suboxone management which she feels has helped her remain sober/abstinent from illicit opiates/drugs. We have reviewed increased overdose potential when using combination of Opiates and BZDs ( reports she has been prescribed Klonopin for years but was not taking it regularly) and have encouraged her to consider tapering off BZDs  No current symptoms of WDL.  Cognitive Features That Contribute To Risk:  No gross cognitive deficits noted upon discharge. Is alert , attentive, and oriented x 3   Suicide Risk:  Mild:  Suicidal ideation of limited frequency, intensity, duration, and specificity.  There are no identifiable plans, no associated intent, mild dysphoria and related symptoms, good self-control (both objective and subjective assessment), few other risk factors, and identifiable protective factors, including available and accessible social support.  Follow-up [facility] Follow up on 02/08/2018.   Why:  Hospital follow-up with Dr. Nancy Fetter on Tuesday, 7/16 at 2:00PM. Thank you.  Contact information: ATTN: Dr. Nancy Fetter [address] [facility].  [place], [place] [address] Phone: [phone] Fax: [phone]       [facility] Follow up on 02/08/2018.   Why:  Hospital follow-up on Tuesday, 7/16 at 9:45AM with Vicente Males. Please ask about bereavement counseling during this appt. Thank you. Contact information: [address] [place], [place] [address] Phone: [phone] Fax: [phone]          Plan Of Care/Follow-up recommendations:  Activity:  as tolerated  Diet:  regular Tests:  NA Other:  See below  Patient is requesting discharge and  there are no current grounds for involuntary commitment Plans to return home Plans to follow up as above, plans to follow up at [facility] for ongoing Suboxone management Plans to follow up with PCP for medical issues as needed. Plans to attend NA regularly, and plans to attend bereavement /support groups .   Jenne Campus, MD 02/03/2018, 11:28 AM

## 2018-02-10 ENCOUNTER — Other Ambulatory Visit: Payer: Self-pay

## 2018-02-10 ENCOUNTER — Emergency Department (HOSPITAL_COMMUNITY)
Admission: EM | Admit: 2018-02-10 | Discharge: 2018-02-11 | Disposition: A | Discharge status: Home / Self Care | Payer: Medicare HMO | Attending: Emergency Medicine | Admitting: Emergency Medicine

## 2018-02-10 ENCOUNTER — Encounter (HOSPITAL_COMMUNITY): Payer: Self-pay | Admitting: Emergency Medicine

## 2018-02-10 DIAGNOSIS — Z79899 Other long term (current) drug therapy: Secondary | ICD-10-CM | POA: Diagnosis not present

## 2018-02-10 DIAGNOSIS — F333 Major depressive disorder, recurrent, severe with psychotic symptoms: Secondary | ICD-10-CM | POA: Diagnosis present

## 2018-02-10 DIAGNOSIS — F17210 Nicotine dependence, cigarettes, uncomplicated: Secondary | ICD-10-CM | POA: Diagnosis not present

## 2018-02-10 DIAGNOSIS — F1420 Cocaine dependence, uncomplicated: Secondary | ICD-10-CM | POA: Diagnosis not present

## 2018-02-10 DIAGNOSIS — E039 Hypothyroidism, unspecified: Secondary | ICD-10-CM | POA: Insufficient documentation

## 2018-02-10 DIAGNOSIS — F341 Dysthymic disorder: Secondary | ICD-10-CM | POA: Diagnosis not present

## 2018-02-10 DIAGNOSIS — F39 Unspecified mood [affective] disorder: Secondary | ICD-10-CM | POA: Diagnosis present

## 2018-02-10 LAB — CBC
HCT: 39.6 % (ref 36.0–46.0)
HEMOGLOBIN: 13.2 g/dL (ref 12.0–15.0)
MCH: 30.4 pg (ref 26.0–34.0)
MCHC: 33.3 g/dL (ref 30.0–36.0)
MCV: 91.2 fL (ref 78.0–100.0)
Platelets: 205 10*3/uL (ref 150–400)
RBC: 4.34 MIL/uL (ref 3.87–5.11)
RDW: 14.4 % (ref 11.5–15.5)
WBC: 8.9 10*3/uL (ref 4.0–10.5)

## 2018-02-10 LAB — I-STAT BETA HCG BLOOD, ED (MC, WL, AP ONLY): I-stat hCG, quantitative: <5 m[IU]/mL (ref <5)

## 2018-02-10 NOTE — ED Triage Notes (Signed)
Pt reports she was sent to [facility]  From [facility] for med clearance  Was recently released from [facility] for  Depressions and AV hallucinations is see dead husband that pasted away 01-22-2018. Poor appetite and not taking medications subutex , gagabpentin Synthroid zanaflex, klonopin, ambien and smybalta since discharged from [facility]. Denies SI/HI but admits to depression and AV hallucinations.

## 2018-02-11 ENCOUNTER — Encounter (HOSPITAL_COMMUNITY): Payer: Self-pay | Admitting: Emergency Medicine

## 2018-02-11 DIAGNOSIS — F341 Dysthymic disorder: Secondary | ICD-10-CM

## 2018-02-11 LAB — COMPREHENSIVE METABOLIC PANEL
ALT: 17 U/L (ref 0–44)
ANION GAP: 7 (ref 5–15)
AST: 19 U/L (ref 15–41)
Albumin: 3.8 g/dL (ref 3.5–5.0)
Alkaline Phosphatase: 56 U/L (ref 38–126)
BILIRUBIN TOTAL: 0.5 mg/dL (ref 0.3–1.2)
BUN: 18 mg/dL (ref 6–20)
CHLORIDE: 105 mmol/L (ref 98–111)
CO2: 29 mmol/L (ref 22–32)
Calcium: 9.9 mg/dL (ref 8.9–10.3)
Creatinine, Ser: 1.37 mg/dL — ABNORMAL HIGH (ref 0.44–1.00)
GFR calc Af Amer: 54 mL/min — ABNORMAL LOW (ref >60)
GFR calc non Af Amer: 46 mL/min — ABNORMAL LOW (ref >60)
Glucose, Bld: 113 mg/dL — ABNORMAL HIGH (ref 70–99)
POTASSIUM: 4.6 mmol/L (ref 3.5–5.1)
Sodium: 141 mmol/L (ref 135–145)
TOTAL PROTEIN: 6.8 g/dL (ref 6.5–8.1)

## 2018-02-11 LAB — SALICYLATE LEVEL

## 2018-02-11 LAB — RAPID URINE DRUG SCREEN, HOSP PERFORMED
Amphetamines: NOT DETECTED
Benzodiazepines: POSITIVE — AB
Cocaine: POSITIVE — AB
OPIATES: NOT DETECTED
TETRAHYDROCANNABINOL: NOT DETECTED

## 2018-02-11 LAB — ACETAMINOPHEN LEVEL

## 2018-02-11 LAB — ETHANOL: Alcohol, Ethyl (B): <10 mg/dL (ref <10)

## 2018-02-11 MED ORDER — HALOPERIDOL 2 MG PO TABS: 2.0000 mg | ORAL_TABLET | Freq: Every day | ORAL | 0 refills | Status: DC

## 2018-02-11 MED ORDER — LORAZEPAM 1 MG PO TABS: 1.0000 mg | ORAL_TABLET | Freq: Three times a day (TID) | ORAL | Status: DC

## 2018-02-11 MED ORDER — LORAZEPAM 1 MG PO TABS: 1.0000 mg | ORAL_TABLET | Freq: Every day | ORAL | Status: DC

## 2018-02-11 MED ORDER — HYDROXYZINE HCL 25 MG PO TABS
25.0000 mg | ORAL_TABLET | Freq: Three times a day (TID) | ORAL | Status: DC | PRN
  Administered 2018-02-11: 25 mg via ORAL
  Filled 2018-02-11: qty 1

## 2018-02-11 MED ORDER — HALOPERIDOL 2 MG PO TABS: 2.0000 mg | ORAL_TABLET | Freq: Every day | ORAL | Status: DC

## 2018-02-11 MED ORDER — DULOXETINE HCL 30 MG PO CPEP
60.0000 mg | ORAL_CAPSULE | Freq: Two times a day (BID) | ORAL | Status: DC
  Administered 2018-02-11 (×2): 60 mg via ORAL
  Filled 2018-02-11 (×2): qty 2

## 2018-02-11 MED ORDER — HALOPERIDOL LACTATE 5 MG/ML IJ SOLN
2.0000 mg | Freq: Once | INTRAMUSCULAR | Status: AC
Start: 2018-02-11 — End: 2018-02-11
  Administered 2018-02-11: 2 mg via INTRAMUSCULAR
  Filled 2018-02-11: qty 1

## 2018-02-11 MED ORDER — LORAZEPAM 1 MG PO TABS: 1.0000 mg | ORAL_TABLET | Freq: Four times a day (QID) | ORAL | Status: DC | PRN

## 2018-02-11 MED ORDER — ALUM & MAG HYDROXIDE-SIMETH 200-200-20 MG/5ML PO SUSP: 30.0000 mL | Freq: Four times a day (QID) | ORAL | Status: DC | PRN

## 2018-02-11 MED ORDER — LORAZEPAM 1 MG PO TABS
1.0000 mg | ORAL_TABLET | Freq: Four times a day (QID) | ORAL | Status: DC
  Administered 2018-02-11: 1 mg via ORAL
  Filled 2018-02-11: qty 1

## 2018-02-11 MED ORDER — NICOTINE 21 MG/24HR TD PT24
21.0000 mg | MEDICATED_PATCH | Freq: Every day | TRANSDERMAL | Status: DC
  Administered 2018-02-11 (×2): 21 mg via TRANSDERMAL
  Filled 2018-02-11 (×2): qty 1

## 2018-02-11 MED ORDER — LOPERAMIDE HCL 2 MG PO CAPS: 2.0000 mg | ORAL_CAPSULE | ORAL | Status: DC | PRN

## 2018-02-11 MED ORDER — VITAMIN B-1 100 MG PO TABS: 100.0000 mg | ORAL_TABLET | Freq: Every day | ORAL | Status: DC

## 2018-02-11 MED ORDER — GABAPENTIN 400 MG PO CAPS
400.0000 mg | ORAL_CAPSULE | Freq: Three times a day (TID) | ORAL | Status: DC
  Administered 2018-02-11: 400 mg via ORAL
  Filled 2018-02-11: qty 1

## 2018-02-11 MED ORDER — THIAMINE HCL 100 MG/ML IJ SOLN: 100.0000 mg | Freq: Once | INTRAMUSCULAR | Status: DC

## 2018-02-11 MED ORDER — ACETAMINOPHEN 325 MG PO TABS: 650.0000 mg | ORAL_TABLET | ORAL | Status: DC | PRN

## 2018-02-11 MED ORDER — LEVOTHYROXINE SODIUM 75 MCG PO TABS
75.0000 ug | ORAL_TABLET | Freq: Every day | ORAL | Status: DC
  Administered 2018-02-11: 75 ug via ORAL
  Filled 2018-02-11: qty 1

## 2018-02-11 MED ORDER — LORAZEPAM 1 MG PO TABS: 1.0000 mg | ORAL_TABLET | Freq: Two times a day (BID) | ORAL | Status: DC

## 2018-02-11 MED ORDER — ADULT MULTIVITAMIN W/MINERALS CH
1.0000 | ORAL_TABLET | Freq: Every day | ORAL | Status: DC
  Administered 2018-02-11: 1 via ORAL
  Filled 2018-02-11: qty 1

## 2018-02-11 MED ORDER — HYDROXYZINE HCL 25 MG PO TABS: 25.0000 mg | ORAL_TABLET | Freq: Four times a day (QID) | ORAL | Status: DC | PRN

## 2018-02-11 MED ORDER — CLONAZEPAM 1 MG PO TABS: 1.0000 mg | ORAL_TABLET | Freq: Three times a day (TID) | ORAL | Status: DC

## 2018-02-11 MED ORDER — ONDANSETRON 4 MG PO TBDP: 4.0000 mg | ORAL_TABLET | Freq: Four times a day (QID) | ORAL | Status: DC | PRN
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
## 2018-02-11 NOTE — ED Provider Notes (Signed)
[facility] DEPT Provider Note   CSN: [id number] Arrival date & time: 02/10/18  2223     History   Chief Complaint Chief Complaint  Patient presents with  . Medical Clearance    HPI [name] is a 42 y.o. female.  The history is provided by the patient.  Mental Health Problem  Presenting symptoms: depression   Presenting symptoms: no homicidal ideas, no self-mutilation, no suicidal thoughts, no suicidal threats and no suicide attempt   Patient accompanied by: none. Degree of incapacity (severity):  Moderate Onset quality:  Gradual (Since her husband died which she told me was Jan 31, 2023.  She told PA last week he died 02-07-23.  She is telling me she relapsed on cocaine the night before his funeral on 6/30) Timing:  Constant Progression:  Unchanged Chronicity:  New Context: stressful life event   Treatment compliance:  Untreated Relieved by:  Nothing Worsened by:  Nothing Ineffective treatments:  Anti-anxiety medications Associated symptoms: insomnia   Associated symptoms: no abdominal pain, no appetite change and no chest pain   Risk factors: no neurological disease     Past Medical History:  Diagnosis Date  . Alcohol abuse   . Anemia   . Anxiety   . Arthritis    knees bilateral   DJD  stenosis   . Blood dyscrasia    HSV  . Cancer ([place])    vaginal  malignant carcinoma  . Chronic back pain   . Complication of anesthesia    panic attack, fight or flight  . Drug-seeking behavior   . Endometriosis   . Fibroids   . Fibromyalgia   . Foot fracture    bilateral and toes  . GERD (gastroesophageal reflux disease)    history of  . Headache(784.0)    history of  . Hypothyroidism   . Neuromuscular disorder (HCC)    neuropathy  . PCOS (polycystic ovarian syndrome)   . Polysubstance abuse ([place])   . PONV (postoperative nausea and vomiting)   . Ruptured lumbar disc   . Sleep apnea    cpap  . Thyroid disease     Patient Active Problem  List   Diagnosis Date Noted  . Major depressive disorder, recurrent episode, severe, with psychosis ([place]) 02/01/2018  . Major depressive disorder, recurrent, severe with psychotic features ([place]) 02/01/2018  . ETOH abuse 07/18/2014  . Alcoholic pancreatitis 84/69/6295  . Drug-seeking behavior 07/18/2014  . Acute pancreatitis 07/16/2014  . Alcohol intoxication in active alcoholic without complication ([place])   . Depression   . Polysubstance dependence including opioid type drug, episodic abuse ([place]) 06/26/2014  . Substance induced mood disorder ([place]) 05/03/2014  . Cocaine abuse with intoxication with perceptual disturbance ([place]) 04/12/2014  . Alcohol dependence with uncomplicated withdrawal ([place]) 04/03/2014  . Alcohol dependence with withdrawal with complication ([place]) 28/41/3244  . Herpes labialis 10/08/2013  . Lumbar radiculopathy, chronic 10/08/2013  . Nicotine addiction 07/30/2012  . Fall down stairs 05/27/2012  . Foot fracture 05/27/2012  . Obesity, Class III, BMI 40-49.9 (morbid obesity) ([place]) 10/31/2011  . Mood disorder ([place]) 10/31/2011  . Hypothyroidism 10/31/2011  . Obstructive sleep apnea 10/31/2011  . Narcotic abuse in remission ([place])   . Chronic back pain     Past Surgical History:  Procedure Laterality Date  . ADENOIDECTOMY    . BACK SURGERY     6 times  . CESAREAN SECTION    . DILITATION & CURRETTAGE/HYSTROSCOPY WITH NOVASURE ABLATION N/A 04/28/2017   Procedure: DILATATION &  CURETTAGE/HYSTEROSCOPY WITH NOVASURE ABLATION;  Surgeon: Brien Few, MD;  Location: [facility];  Service: Gynecology;  Laterality: N/A;  . TONSILLECTOMY    . TUBAL LIGATION       OB History    Gravida  1   Para  1   Term  1   Preterm      AB      Living        SAB      TAB      Ectopic      Multiple      Live Births               Home Medications    Prior to Admission medications   Medication Sig Start Date End Date Taking? Authorizing Provider  DULoxetine  (CYMBALTA) 60 MG capsule Take 1 capsule (60 mg total) by mouth 2 (two) times daily. 02/03/18   Mordecai Maes, NP  gabapentin (NEURONTIN) 400 MG capsule Take 1 capsule (400 mg total) by mouth 4 (four) times daily. 02/03/18   Mordecai Maes, NP  hydrOXYzine (ATARAX/VISTARIL) 25 MG tablet Take 1 tablet (25 mg total) by mouth every 6 (six) hours as needed (mild/moderate anxiety). 02/03/18   Mordecai Maes, NP  levothyroxine (SYNTHROID, LEVOTHROID) 75 MCG tablet Take 1 tablet (75 mcg total) by mouth daily before breakfast. 08/25/14   Leandrew Koyanagi, MD    Family History Family History  Problem Relation Age of Onset  . Arthritis Mother   . Clotting disorder Mother   . Diabetes Mother   . Hypertension Mother   . Hyperlipidemia Father   . Heart disease Father   . Prostate cancer Father   . Leukemia Maternal Grandmother   . Diabetes Maternal Grandfather   . Stroke Paternal Grandmother   . Heart disease Paternal Grandmother     Social History Social History   Tobacco Use  . Smoking status: Current Every [place] Smoker    Packs/[place]: 1.00    Years: 20.00    Pack years: 20.00    Types: Cigarettes  . Smokeless tobacco: Never Used  Substance Use Topics  . Alcohol use: No    Comment: denies alcohol since 12/16  . Drug use: Yes    Types: Cocaine, Amphetamines, Marijuana, Benzodiazepines    Comment: narcotic abuse recoverying addict     Allergies   Moxifloxacin and Quinolones   Review of Systems Review of Systems  Constitutional: Negative for appetite change.  Cardiovascular: Negative for chest pain.  Gastrointestinal: Negative for abdominal pain.  Psychiatric/Behavioral: Negative for homicidal ideas, self-injury and suicidal ideas. The patient has insomnia.   All other systems reviewed and are negative.    Physical Exam Updated Vital Signs BP 112/79 (BP Location: Left Arm)   Pulse 100   Temp 98.7 F (37.1 C) (Oral)   Resp 18   Ht 5' 6.5" (1.689 m)   Wt 131.5 kg (290  lb)   SpO2 98%   BMI 46.11 kg/m   Physical Exam  Constitutional: She is oriented to person, place, and time. She appears well-developed and well-nourished. No distress.  HENT:  Head: Normocephalic and atraumatic.  Mouth/Throat: No oropharyngeal exudate.  Eyes: Pupils are equal, round, and reactive to light. Conjunctivae are normal.  Neck: Normal range of motion. Neck supple.  Cardiovascular: Normal rate, regular rhythm, normal heart sounds and intact distal pulses.  Pulmonary/Chest: Effort normal and breath sounds normal. No stridor. She has no wheezes. She has no rales.  Abdominal: Soft. Bowel sounds are  normal. She exhibits no mass. There is no tenderness. There is no rebound and no guarding.  Musculoskeletal: Normal range of motion.  Neurological: She is alert and oriented to person, place, and time. She displays normal reflexes. Coordination normal.  Skin: Skin is warm and dry. Capillary refill takes less than 2 seconds.  Psychiatric: She has a normal mood and affect.     ED Treatments / Results  Labs (all labs ordered are listed, but only abnormal results are displayed) Results for orders placed or performed during the hospital encounter of 02/10/18  Comprehensive metabolic panel  Result Value Ref Range   Sodium 141 135 - 145 mmol/L   Potassium 4.6 3.5 - 5.1 mmol/L   Chloride 105 98 - 111 mmol/L   CO2 29 22 - 32 mmol/L   Glucose, Bld 113 (H) 70 - 99 mg/dL   BUN 18 6 - 20 mg/dL   Creatinine, Ser 1.37 (H) 0.44 - 1.00 mg/dL   Calcium 9.9 8.9 - 10.3 mg/dL   Total Protein 6.8 6.5 - 8.1 g/dL   Albumin 3.8 3.5 - 5.0 g/dL   AST 19 15 - 41 U/L   ALT 17 0 - 44 U/L   Alkaline Phosphatase 56 38 - 126 U/L   Total Bilirubin 0.5 0.3 - 1.2 mg/dL   GFR calc non Af Amer 46 (L) >60 mL/min   GFR calc Af Amer 54 (L) >60 mL/min   Anion gap 7 5 - 15  Ethanol  Result Value Ref Range   Alcohol, Ethyl (B) <26 <94 mg/dL  Salicylate level  Result Value Ref Range   Salicylate Lvl <8.5 2.8 -  30.0 mg/dL  Acetaminophen level  Result Value Ref Range   Acetaminophen (Tylenol), Serum <10 (L) 10 - 30 ug/mL  cbc  Result Value Ref Range   WBC 8.9 4.0 - 10.5 K/uL   RBC 4.34 3.87 - 5.11 MIL/uL   Hemoglobin 13.2 12.0 - 15.0 g/dL   HCT 39.6 36.0 - 46.0 %   MCV 91.2 78.0 - 100.0 fL   MCH 30.4 26.0 - 34.0 pg   MCHC 33.3 30.0 - 36.0 g/dL   RDW 14.4 11.5 - 15.5 %   Platelets 205 150 - 400 K/uL  I-Stat beta hCG blood, ED  Result Value Ref Range   I-stat hCG, quantitative <5.0 <5 mIU/mL   Comment 3           No results found.  EKG None  Radiology No results found.  Procedures Procedures (including critical care time)   Final Clinical Impressions(s) / ED Diagnoses   Medically cleared by me for psychiatry, dispo per that service.  I cannot confirm she is on klonopin 2 mg TID and I will not be ordering that.  Have placed holding orders   Iveth Heidemann, MD 02/11/18 4627

## 2018-02-11 NOTE — ED Notes (Signed)
Pt to room #43. Pt pleasant on approach. C/o anxiety. Acknowledges recent inpatient admission to [facility] and being discharged last week. Reports husband recently died. Reports seeing visions of deceased husband. Denies SI/HI. Reports "God and daughter" give her hope. Encouragement and support provided. Special checks q 15 mins in place for safety, Video monitoring in place. Will continue to monitor.

## 2018-02-11 NOTE — [facility] Suicide Risk Assessment (Signed)
Suicide Risk Assessment  Discharge Assessment   [facility] Discharge Suicide Risk Assessment   Principal Problem: Mood disorder [facility]) Discharge Diagnoses:  Patient Active Problem List   Diagnosis Date Noted  . Major depressive disorder, recurrent episode, severe, with psychosis ([place]) [F33.3] 02/01/2018  . Major depressive disorder, recurrent, severe with psychotic features ([place]) [F33.3] 02/01/2018  . ETOH abuse [F10.10] 07/18/2014  . Alcoholic pancreatitis [T41.96] 07/18/2014  . Drug-seeking behavior [Z76.5] 07/18/2014  . Acute pancreatitis [K85.90] 07/16/2014  . Alcohol intoxication in active alcoholic without complication ([place]) [Q22.979]   . Depression [F32.9]   . Polysubstance dependence including opioid type drug, episodic abuse ([place]) [F11.20, F19.20] 06/26/2014  . Substance induced mood disorder ([place]) [F19.94] 05/03/2014  . Cocaine abuse with intoxication with perceptual disturbance ([place]) [F14.122] 04/12/2014  . Alcohol dependence with uncomplicated withdrawal ([place]) [F10.230] 04/03/2014  . Alcohol dependence with withdrawal with complication ([place]) [G92.119] 04/02/2014  . Herpes labialis [B00.1] 10/08/2013  . Lumbar radiculopathy, chronic [M54.16] 10/08/2013  . Nicotine addiction [F17.200] 07/30/2012  . Fall down stairs [W10.8XXA] 05/27/2012  . Foot fracture [S92.909A] 05/27/2012  . Obesity, Class III, BMI 40-49.9 (morbid obesity) ([place]) [E66.01] 10/31/2011  . Mood disorder ([place]) [F39] 10/31/2011  . Hypothyroidism [E03.9] 10/31/2011  . Obstructive sleep apnea [G47.33] 10/31/2011  . Narcotic abuse in remission ([place]) [F11.11]   . Chronic back pain [M54.9, G89.29]    Pt was seen and chart reviewed with treatment team and Dr Mariea Clonts. Pt denies suicidal/homicidal ideation, denies auditory/visual hallucinations and does not appear to be responding to internal stimuli. Pt stated her ex-husband passed away on Feb 09, 2023 and she relapsed on Cocaine the [place] before his funeral and she keeps seeing  him. Pt appears depressed.  Pt is followe dby Dr Nancy Fetter at [facility] and goes to [facility] for her therapy and Suboxone prescription. Pt was discharged from [facility] on 7-11 and followed up with Dr Nancy Fetter on 7-16. PMPAware datatbase shows Pt is taking 6 mg Klonopin daily and Suboxone 8 mg twice daily. Pt was given Haldol 2 mg in the [facility] and she stated it did help her sleep. Pt state she has not been sleeping well. Pt's UDS positive for Benzos and Cocaine. Pt denies any cocaine use since the funeral. Pt is stable and psychiatrically clear for discharge.   Total Time spent with patient: 30 minutes  Musculoskeletal: Strength & Muscle Tone: within normal limits Gait & Station: normal Patient leans: N/A  Psychiatric Specialty Exam:   Blood pressure 93/80, pulse 60, temperature 97.6 F (36.4 C), temperature source Oral, resp. rate 18, height 5' 6.5" (1.689 m), weight 290 lb (131.5 kg), SpO2 97 %.Body mass index is 46.11 kg/m.  General Appearance: Casual  Eye Contact::  Good  Speech:  Clear and Coherent and Normal Rate409  Volume:  Normal  Mood:  Depressed  Affect:  Congruent and Depressed  Thought Process:  Coherent, Goal Directed and Linear  Orientation:  Full (Time, Place, and Person)  Thought Content:  Logical  Suicidal Thoughts:  No  Homicidal Thoughts:  No  Memory:  Immediate;   Good Recent;   Good Remote;   Fair  Judgement:  Fair  Insight:  Fair  Psychomotor Activity:  Normal  Concentration:  Good  Recall:  Good  Fund of Knowledge:Good  Language: Good  Akathisia:  No  Handed:  Right  AIMS (if indicated):     Assets:  Communication Skills Desire for Improvement Financial Resources/Insurance Housing Social Support  Sleep:  Cognition: WNL  ADL's:  Intact   Mental Status Per Nursing Assessment::   On Admission:     Demographic Factors:  Divorced or widowed and Caucasian  Loss Factors: Financial problems/change in socioeconomic  status  Historical Factors: Impulsivity  Risk Reduction Factors:   Sense of responsibility to family  Continued Clinical Symptoms:  Depression:   Impulsivity  Cognitive Features That Contribute To Risk:  Closed-mindedness    Suicide Risk:  Minimal: No identifiable suicidal ideation.  Patients presenting with no risk factors but with morbid ruminations; may be classified as minimal risk based on the severity of the depressive symptoms   Plan Of Care/Follow-up recommendations:  Activity:  as tolerated Diet:  Heart healthy  Ethelene Hal, NP 02/11/2018, 11:29 AM

## 2018-02-11 NOTE — ED Notes (Signed)
This nurse notified Margarita Grizzle NP pt c/o anxiety; awaiting orders.

## 2018-02-11 NOTE — ED Notes (Addendum)
Pt presents with Depression and Visual hallucinations after the death of her Husband on 01/29/18.  Denies SI or HI, denies feeling hopeless.  Pt reports she relapsed on Cocaine after 3 years Sobriety.  No eating or sleeping in 4 days.  Pt reports history of Depression, Psychosis , PTSD and GAD.  Monitoring for safety.

## 2018-02-11 NOTE — ED Notes (Signed)
Pt discharged safely after reviewing discharge instructions and RX.  Pt was in no distress.   All belongings were returned to pt.

## 2018-02-11 NOTE — Discharge Instructions (Signed)
For your behavioral health needs, you are advised to continue treatment with your current providers:       [facility]      [address] [place], [place] [address]      [phone]       [facility]      [address] [place], [place] [address]      [phone]

## 2018-02-11 NOTE — BH Assessment (Signed)
[facility] Assessment Progress Note  Per Buford Dresser, DO, this pt does not require psychiatric hospitalization at this time.  Pt is to be discharged from [facility] with recommendation to follow up with her current providers, [facility].  This has been included in pt's discharge instructions.  Pt's nurse, Caryl Pina, has been notified.  [name], [place] Triage Specialist [phone]

## 2018-11-01 ENCOUNTER — Other Ambulatory Visit: Payer: Self-pay

## 2018-11-01 ENCOUNTER — Encounter (HOSPITAL_COMMUNITY): Payer: Self-pay | Admitting: Emergency Medicine

## 2018-11-01 ENCOUNTER — Emergency Department (HOSPITAL_COMMUNITY)
Admission: EM | Admit: 2018-11-01 | Discharge: 2018-11-01 | Disposition: A | Discharge status: Home / Self Care | Payer: Medicare HMO | Attending: Emergency Medicine | Admitting: Emergency Medicine

## 2018-11-01 DIAGNOSIS — R1032 Left lower quadrant pain: Secondary | ICD-10-CM | POA: Diagnosis present

## 2018-11-01 DIAGNOSIS — F17210 Nicotine dependence, cigarettes, uncomplicated: Secondary | ICD-10-CM | POA: Diagnosis not present

## 2018-11-01 DIAGNOSIS — N739 Female pelvic inflammatory disease, unspecified: Secondary | ICD-10-CM | POA: Insufficient documentation

## 2018-11-01 DIAGNOSIS — A599 Trichomoniasis, unspecified: Secondary | ICD-10-CM | POA: Insufficient documentation

## 2018-11-01 DIAGNOSIS — E039 Hypothyroidism, unspecified: Secondary | ICD-10-CM | POA: Insufficient documentation

## 2018-11-01 DIAGNOSIS — Z79899 Other long term (current) drug therapy: Secondary | ICD-10-CM | POA: Diagnosis not present

## 2018-11-01 DIAGNOSIS — N730 Acute parametritis and pelvic cellulitis: Secondary | ICD-10-CM

## 2018-11-01 LAB — COMPREHENSIVE METABOLIC PANEL
ALT: 18 U/L (ref 0–44)
AST: 17 U/L (ref 15–41)
Albumin: 3.8 g/dL (ref 3.5–5.0)
Alkaline Phosphatase: 65 U/L (ref 38–126)
Anion gap: 10 (ref 5–15)
BUN: 9 mg/dL (ref 6–20)
CO2: 24 mmol/L (ref 22–32)
Calcium: 9.2 mg/dL (ref 8.9–10.3)
Chloride: 103 mmol/L (ref 98–111)
Creatinine, Ser: 0.97 mg/dL (ref 0.44–1.00)
GFR calc Af Amer: >60 mL/min (ref >60)
GFR calc non Af Amer: >60 mL/min (ref >60)
Glucose, Bld: 107 mg/dL — ABNORMAL HIGH (ref 70–99)
Potassium: 3.8 mmol/L (ref 3.5–5.1)
Sodium: 137 mmol/L (ref 135–145)
Total Bilirubin: 0.8 mg/dL (ref 0.3–1.2)
Total Protein: 6.7 g/dL (ref 6.5–8.1)

## 2018-11-01 LAB — URINALYSIS, ROUTINE W REFLEX MICROSCOPIC
Bacteria, UA: NONE SEEN
Bilirubin Urine: NEGATIVE
Glucose, UA: NEGATIVE mg/dL
Ketones, ur: 20 mg/dL — AB
Nitrite: NEGATIVE
Protein, ur: 30 mg/dL — AB
RBC / HPF: >50 RBC/hpf — ABNORMAL HIGH (ref 0–5)
Specific Gravity, Urine: 1.023 (ref 1.005–1.030)
WBC, UA: >50 WBC/hpf — ABNORMAL HIGH (ref 0–5)
pH: 6 (ref 5.0–8.0)

## 2018-11-01 LAB — CBC WITH DIFFERENTIAL/PLATELET
Abs Immature Granulocytes: 0.03 10*3/uL (ref 0.00–0.07)
Basophils Absolute: 0.1 10*3/uL (ref 0.0–0.1)
Basophils Relative: 1 %
Eosinophils Absolute: 0.1 10*3/uL (ref 0.0–0.5)
Eosinophils Relative: 1 %
HCT: 45.5 % (ref 36.0–46.0)
Hemoglobin: 15.9 g/dL — ABNORMAL HIGH (ref 12.0–15.0)
Immature Granulocytes: 0 %
Lymphocytes Relative: 20 %
Lymphs Abs: 1.9 10*3/uL (ref 0.7–4.0)
MCH: 32.3 pg (ref 26.0–34.0)
MCHC: 34.9 g/dL (ref 30.0–36.0)
MCV: 92.5 fL (ref 80.0–100.0)
Monocytes Absolute: 0.6 10*3/uL (ref 0.1–1.0)
Monocytes Relative: 7 %
Neutro Abs: 6.8 10*3/uL (ref 1.7–7.7)
Neutrophils Relative %: 71 %
Platelets: 188 10*3/uL (ref 150–400)
RBC: 4.92 MIL/uL (ref 3.87–5.11)
RDW: 12.9 % (ref 11.5–15.5)
WBC: 9.4 10*3/uL (ref 4.0–10.5)
nRBC: 0 % (ref 0.0–0.2)

## 2018-11-01 LAB — WET PREP, GENITAL
Clue Cells Wet Prep HPF POC: NONE SEEN
Sperm: NONE SEEN
Yeast Wet Prep HPF POC: NONE SEEN

## 2018-11-01 LAB — LIPASE, BLOOD: Lipase: 19 U/L (ref 11–51)

## 2018-11-01 LAB — MAGNESIUM: Magnesium: 2 mg/dL (ref 1.7–2.4)

## 2018-11-01 LAB — HCG, QUANTITATIVE, PREGNANCY: hCG, Beta Chain, Quant, S: <1 m[IU]/mL (ref <5)

## 2018-11-01 MED ORDER — GABAPENTIN 400 MG PO CAPS
800.0000 mg | ORAL_CAPSULE | Freq: Four times a day (QID) | ORAL | Status: DC
  Administered 2018-11-01: 19:00:00 800 mg via ORAL
  Filled 2018-11-01: qty 2

## 2018-11-01 MED ORDER — CEFTRIAXONE SODIUM 250 MG IJ SOLR
250.0000 mg | Freq: Once | INTRAMUSCULAR | Status: AC
  Administered 2018-11-01: 19:00:00 250 mg via INTRAMUSCULAR
  Filled 2018-11-01: qty 250

## 2018-11-01 MED ORDER — PROMETHAZINE HCL 25 MG PO TABS
25.0000 mg | ORAL_TABLET | Freq: Once | ORAL | Status: AC
  Administered 2018-11-01: 25 mg via ORAL
  Filled 2018-11-01: qty 1

## 2018-11-01 MED ORDER — SODIUM CHLORIDE 0.9 % IV BOLUS
1000.0000 mL | Freq: Once | INTRAVENOUS | Status: AC
  Administered 2018-11-01: 18:00:00 1000 mL via INTRAVENOUS

## 2018-11-01 MED ORDER — FOSFOMYCIN TROMETHAMINE 3 G PO PACK
3.0000 g | PACK | Freq: Once | ORAL | Status: AC
  Administered 2018-11-01: 20:00:00 3 g via ORAL
  Filled 2018-11-01: qty 3

## 2018-11-01 MED ORDER — ONDANSETRON 4 MG PO TBDP: ORAL_TABLET | ORAL | 0 refills | Status: DC

## 2018-11-01 MED ORDER — DULOXETINE HCL 30 MG PO CPEP
60.0000 mg | ORAL_CAPSULE | Freq: Two times a day (BID) | ORAL | Status: DC
  Administered 2018-11-01: 60 mg via ORAL
  Filled 2018-11-01: qty 2

## 2018-11-01 MED ORDER — DOXYCYCLINE HYCLATE 100 MG PO CAPS: 100.0000 mg | ORAL_CAPSULE | Freq: Two times a day (BID) | ORAL | 0 refills | Status: DC

## 2018-11-01 MED ORDER — METRONIDAZOLE 500 MG PO TABS
2000.0000 mg | ORAL_TABLET | Freq: Once | ORAL | Status: AC
  Administered 2018-11-01: 19:00:00 2000 mg via ORAL
  Filled 2018-11-01: qty 4

## 2018-11-01 MED ORDER — KETOROLAC TROMETHAMINE 60 MG/2ML IM SOLN
15.0000 mg | Freq: Once | INTRAMUSCULAR | Status: AC
  Administered 2018-11-01: 20:00:00 15 mg via INTRAMUSCULAR
  Filled 2018-11-01: qty 2

## 2018-11-01 MED ORDER — STERILE WATER FOR INJECTION IJ SOLN
INTRAMUSCULAR | Status: AC
  Administered 2018-11-01: 19:00:00 10 mL
  Filled 2018-11-01: qty 10

## 2018-11-01 MED ORDER — ONDANSETRON HCL 4 MG/2ML IJ SOLN
4.0000 mg | Freq: Once | INTRAMUSCULAR | Status: AC
  Administered 2018-11-01: 18:00:00 4 mg via INTRAVENOUS
  Filled 2018-11-01: qty 2

## 2018-11-01 MED ORDER — AZITHROMYCIN 1 G PO PACK
1.0000 g | PACK | Freq: Once | ORAL | Status: AC
  Administered 2018-11-01: 1 g via ORAL
  Filled 2018-11-01: qty 1

## 2018-11-01 MED ORDER — CLONAZEPAM 0.5 MG PO TABS
2.0000 mg | ORAL_TABLET | Freq: Two times a day (BID) | ORAL | Status: DC
  Administered 2018-11-01: 19:00:00 2 mg via ORAL
  Filled 2018-11-01: qty 4

## 2018-11-01 NOTE — ED Provider Notes (Signed)
[facility] DEPT Provider Note   CSN: [id number] Arrival date & time: 11/01/18  1646    History   Chief Complaint Chief Complaint  Patient presents with   Abdominal Pain    HPI [name] is a 43 y.o. female.     43 yo F with a chief complaint of lower abdominal pain.  Going on for at least 3 weeks.  Having some nausea vomiting and diarrhea with it as well.  Having some subjective chills.  She also thinks that she has a vaginal infection.  States that she has a bad odor down there and thinks she may have PID.  Feels that the pain started in her vagina and now has come up into her lower abdomen.  She is having some urinary symptoms as well.  She is also upset because she has been unable to afford her medications because her husband has lost his job.  The history is provided by the patient.  Abdominal Pain  Pain location:  LLQ and RLQ Pain quality: burning and cramping   Pain radiates to:  Does not radiate Pain severity:  Moderate Onset quality:  Gradual Duration:  3 weeks Timing:  Constant Progression:  Worsening Chronicity:  New Relieved by:  Nothing Worsened by:  Nothing Ineffective treatments:  None tried Associated symptoms: no chest pain, no chills, no dysuria, no fever, no nausea, no shortness of breath and no vomiting     Past Medical History:  Diagnosis Date   Alcohol abuse    Anemia    Anxiety    Arthritis    knees bilateral   DJD  stenosis    Blood dyscrasia    HSV   Cancer (HCC)    vaginal  malignant carcinoma   Chronic back pain    Complication of anesthesia    panic attack, fight or flight   Drug-seeking behavior    Endometriosis    Fibroids    Fibromyalgia    Foot fracture    bilateral and toes   GERD (gastroesophageal reflux disease)    history of   Headache(784.0)    history of   Hypothyroidism    Neuromuscular disorder (HCC)    neuropathy   PCOS (polycystic ovarian syndrome)     Polysubstance abuse (HCC)    PONV (postoperative nausea and vomiting)    Ruptured lumbar disc    Sleep apnea    cpap   Thyroid disease     Patient Active Problem List   Diagnosis Date Noted   Major depressive disorder, recurrent episode, severe, with psychosis ([place]) 02/01/2018   Major depressive disorder, recurrent, severe with psychotic features ([place]) 02/01/2018   ETOH abuse 84/69/6295   Alcoholic pancreatitis 28/41/3244   Drug-seeking behavior 07/18/2014   Acute pancreatitis 07/16/2014   Alcohol intoxication in active alcoholic without complication ([place])    Depression    Polysubstance dependence including opioid type drug, episodic abuse ([place]) 06/26/2014   Substance induced mood disorder ([place]) 05/03/2014   Cocaine abuse with intoxication with perceptual disturbance ([place]) 04/12/2014   Alcohol dependence with uncomplicated withdrawal ([place]) 04/03/2014   Alcohol dependence with withdrawal with complication ([place]) 07/29/7251   Herpes labialis 10/08/2013   Lumbar radiculopathy, chronic 10/08/2013   Nicotine addiction 07/30/2012   Fall down stairs 05/27/2012   Foot fracture 05/27/2012   Obesity, Class III, BMI 40-49.9 (morbid obesity) ([place]) 10/31/2011   Mood disorder ([place]) 10/31/2011   Hypothyroidism 10/31/2011   Obstructive sleep apnea 10/31/2011   Narcotic abuse  in remission ([place])    Chronic back pain     Past Surgical History:  Procedure Laterality Date   ADENOIDECTOMY     BACK SURGERY     6 times   CESAREAN SECTION     DILITATION & CURRETTAGE/HYSTROSCOPY WITH NOVASURE ABLATION N/A 04/28/2017   Procedure: DILATATION & CURETTAGE/HYSTEROSCOPY WITH NOVASURE ABLATION;  Surgeon: Brien Few, MD;  Location: [facility];  Service: Gynecology;  Laterality: N/A;   TONSILLECTOMY     TUBAL LIGATION       OB History    Gravida  1   Para  1   Term  1   Preterm      AB      Living        SAB      TAB      Ectopic      Multiple       Live Births               Home Medications    Prior to Admission medications   Medication Sig Start Date End Date Taking? Authorizing Provider  clonazePAM (KLONOPIN) 2 MG tablet Take 2 mg by mouth 2 (two) times daily.   Yes [provider]  DULoxetine (CYMBALTA) 60 MG capsule Take 1 capsule (60 mg total) by mouth 2 (two) times daily. 02/03/18  Yes Mordecai Maes, NP  gabapentin (NEURONTIN) 800 MG tablet Take 800 mg by mouth 4 (four) times daily.   Yes [provider]  levothyroxine (SYNTHROID, LEVOTHROID) 75 MCG tablet Take 1 tablet (75 mcg total) by mouth daily before breakfast. 08/25/14  Yes Leandrew Koyanagi, MD  zolpidem (AMBIEN) 10 MG tablet Take 10 mg by mouth at bedtime as needed for sleep.   Yes [provider]  doxycycline (VIBRAMYCIN) 100 MG capsule Take 1 capsule (100 mg total) by mouth 2 (two) times daily. One po bid x 7 days 11/01/18   Deno Etienne, DO  gabapentin (NEURONTIN) 400 MG capsule Take 1 capsule (400 mg total) by mouth 4 (four) times daily. Patient not taking: Reported on 11/01/2018 02/03/18   Mordecai Maes, NP  haloperidol (HALDOL) 2 MG tablet Take 1 tablet (2 mg total) by mouth at bedtime. Patient not taking: Reported on 11/01/2018 02/11/18   Ethelene Hal, NP  hydrOXYzine (ATARAX/VISTARIL) 25 MG tablet Take 1 tablet (25 mg total) by mouth every 6 (six) hours as needed (mild/moderate anxiety). Patient not taking: Reported on 11/01/2018 02/03/18   Mordecai Maes, NP  ondansetron (ZOFRAN ODT) 4 MG disintegrating tablet 4mg  ODT q4 hours prn nausea/vomit 11/01/18   Deno Etienne, DO    Family History Family History  Problem Relation Age of Onset   Arthritis Mother    Clotting disorder Mother    Diabetes Mother    Hypertension Mother    Hyperlipidemia Father    Heart disease Father    Prostate cancer Father    Leukemia Maternal Grandmother    Diabetes Maternal Grandfather    Stroke Paternal Grandmother    Heart disease  Paternal Grandmother     Social History Social History   Tobacco Use   Smoking status: Current Every [place] Smoker    Packs/[place]: 1.00    Years: 20.00    Pack years: 20.00    Types: Cigarettes   Smokeless tobacco: Never Used  Substance Use Topics   Alcohol use: No    Comment: denies alcohol since 12/16   Drug use: Yes    Types: Cocaine, Amphetamines, Marijuana,  Benzodiazepines    Comment: narcotic abuse recoverying addict     Allergies   Quinolones   Review of Systems Review of Systems  Constitutional: Negative for chills and fever.  HENT: Negative for congestion and rhinorrhea.   Eyes: Negative for redness and visual disturbance.  Respiratory: Negative for shortness of breath and wheezing.   Cardiovascular: Negative for chest pain and palpitations.  Gastrointestinal: Positive for abdominal pain. Negative for nausea and vomiting.  Genitourinary: Negative for dysuria and urgency.  Musculoskeletal: Negative for arthralgias and myalgias.  Skin: Negative for pallor and wound.  Neurological: Negative for dizziness and headaches.     Physical Exam Updated Vital Signs BP 137/86    Pulse (!) 48    Temp 99 F (37.2 C) (Oral)    Resp (!) 21    Ht 5\' 7"  (1.702 m)    SpO2 97%    BMI 45.42 kg/m   Physical Exam Vitals signs and nursing note reviewed.  Constitutional:      General: She is not in acute distress.    Appearance: She is well-developed. She is obese. She is not diaphoretic.  HENT:     Head: Normocephalic and atraumatic.  Eyes:     Pupils: Pupils are equal, round, and reactive to light.  Neck:     Musculoskeletal: Normal range of motion and neck supple.  Cardiovascular:     Rate and Rhythm: Normal rate and regular rhythm.     Heart sounds: No murmur. No friction rub. No gallop.   Pulmonary:     Effort: Pulmonary effort is normal.     Breath sounds: No wheezing or rales.  Abdominal:     General: There is no distension.     Palpations: Abdomen is soft.      Tenderness: There is no abdominal tenderness.     Comments: Benign abdominal exam  Genitourinary:    Comments: Purulent discharge from the cervix.  Mild CMT.  No noted adnexal tenderness.  Musculoskeletal:        General: No tenderness.  Skin:    General: Skin is warm and dry.  Neurological:     Mental Status: She is alert and oriented to person, place, and time.  Psychiatric:        Behavior: Behavior normal.      ED Treatments / Results  Labs (all labs ordered are listed, but only abnormal results are displayed) Labs Reviewed  WET PREP, GENITAL - Abnormal; Notable for the following components:      Result Value   Trich, Wet Prep PRESENT (*)    WBC, Wet Prep HPF POC MANY (*)    All other components within normal limits  CBC WITH DIFFERENTIAL/PLATELET - Abnormal; Notable for the following components:   Hemoglobin 15.9 (*)    All other components within normal limits  COMPREHENSIVE METABOLIC PANEL - Abnormal; Notable for the following components:   Glucose, Bld 107 (*)    All other components within normal limits  URINALYSIS, ROUTINE W REFLEX MICROSCOPIC - Abnormal; Notable for the following components:   Color, Urine AMBER (*)    APPearance CLOUDY (*)    Hgb urine dipstick SMALL (*)    Ketones, ur 20 (*)    Protein, ur 30 (*)    Leukocytes,Ua LARGE (*)    RBC / HPF >50 (*)    WBC, UA >50 (*)    All other components within normal limits  GASTROINTESTINAL PANEL BY PCR, STOOL (REPLACES STOOL CULTURE)  LIPASE, BLOOD  MAGNESIUM  HCG, QUANTITATIVE, PREGNANCY  RPR  HIV ANTIBODY (ROUTINE TESTING W REFLEX)  GC/CHLAMYDIA PROBE AMP ([place]) NOT AT [facility]    EKG EKG Interpretation  Date/Time:  Tuesday November 01 2018 18:12:01 EDT Ventricular Rate:  48 PR Interval:    QRS Duration: 88 QT Interval:  482 QTC Calculation: 431 R Axis:   30 Text Interpretation:  Sinus bradycardia Borderline short PR interval no wpw, prolonged qt or brugada Confirmed by Deno Etienne [phone]) on  11/01/2018 6:14:10 PM   Radiology No results found.  Procedures Procedures (including critical care time)  Medications Ordered in ED Medications  DULoxetine (CYMBALTA) DR capsule 60 mg (60 mg Oral Given 11/01/18 1839)  gabapentin (NEURONTIN) capsule 800 mg (800 mg Oral Given 11/01/18 1839)  clonazePAM (KLONOPIN) tablet 2 mg (2 mg Oral Given 11/01/18 1838)  fosfomycin (MONUROL) packet 3 g (has no administration in time range)  promethazine (PHENERGAN) tablet 25 mg (has no administration in time range)  ketorolac (TORADOL) injection 15 mg (has no administration in time range)  sodium chloride 0.9 % bolus 1,000 mL (1,000 mLs Intravenous New Bag/Given 11/01/18 1824)  ondansetron (ZOFRAN) injection 4 mg (4 mg Intravenous Given 11/01/18 1825)  cefTRIAXone (ROCEPHIN) injection 250 mg (250 mg Intramuscular Given 11/01/18 1909)  azithromycin (ZITHROMAX) powder 1 g (1 g Oral Given 11/01/18 1906)  metroNIDAZOLE (FLAGYL) tablet 2,000 mg (2,000 mg Oral Given 11/01/18 1906)  sterile water (preservative free) injection (10 mLs  Given 11/01/18 1912)     Initial Impression / Assessment and Plan / ED Course  I have reviewed the triage vital signs and the nursing notes.  Pertinent labs & imaging results that were available during my care of the patient were reviewed by me and considered in my medical decision making (see chart for details).        43 yo F with a chief complaint of pelvic discomfort.  Going on for about 3 weeks now.  She is well-appearing and nontoxic on my exam.  She has no abdominal tenderness.  Will perform a pelvic exam.  Patient is bradycardic here.  Sinus.  No noted arrhythmia on monitor.  BP mildly hypertensive. Trich on wet prep.  Patient also has ?UTI, though appears contaminated.  Will give fosfomycin.  PCP follow up.   7:44 PM:  I have discussed the diagnosis/risks/treatment options with the patient and believe the pt to be eligible for discharge home to follow-up with PCP. We also  discussed returning to the ED immediately if new or worsening sx occur. We discussed the sx which are most concerning (e.g., sudden worsening pain, fever, inability to tolerate by mouth) that necessitate immediate return. Medications administered to the patient during their visit and any new prescriptions provided to the patient are listed below.  Medications given during this visit Medications  DULoxetine (CYMBALTA) DR capsule 60 mg (60 mg Oral Given 11/01/18 1839)  gabapentin (NEURONTIN) capsule 800 mg (800 mg Oral Given 11/01/18 1839)  clonazePAM (KLONOPIN) tablet 2 mg (2 mg Oral Given 11/01/18 1838)  fosfomycin (MONUROL) packet 3 g (has no administration in time range)  promethazine (PHENERGAN) tablet 25 mg (has no administration in time range)  ketorolac (TORADOL) injection 15 mg (has no administration in time range)  sodium chloride 0.9 % bolus 1,000 mL (1,000 mLs Intravenous New Bag/Given 11/01/18 1824)  ondansetron (ZOFRAN) injection 4 mg (4 mg Intravenous Given 11/01/18 1825)  cefTRIAXone (ROCEPHIN) injection 250 mg (250 mg Intramuscular Given 11/01/18 1909)  azithromycin (ZITHROMAX) powder 1  g (1 g Oral Given 11/01/18 1906)  metroNIDAZOLE (FLAGYL) tablet 2,000 mg (2,000 mg Oral Given 11/01/18 1906)  sterile water (preservative free) injection (10 mLs  Given 11/01/18 1912)     The patient appears reasonably screen and/or stabilized for discharge and I doubt any other medical condition or other [facility] requiring further screening, evaluation, or treatment in the ED at this time prior to discharge.    Final Clinical Impressions(s) / ED Diagnoses   Final diagnoses:  Trichomonosis  PID (acute pelvic inflammatory disease)    ED Discharge Orders         Ordered    doxycycline (VIBRAMYCIN) 100 MG capsule  2 times daily     11/01/18 1935    ondansetron (ZOFRAN ODT) 4 MG disintegrating tablet     11/01/18 1936           Deno Etienne, DO 11/01/18 1945

## 2018-11-01 NOTE — ED Triage Notes (Signed)
Patient c/o abdominal pain with N/V/D x3 weeks. Reports treating yeast infection at home x3 weeks ago. Patient tearful in triage, reporting "I am depressed and anxious."

## 2018-11-01 NOTE — ED Notes (Signed)
Pt verbalized discharge instructions and follow up care. Alert and ambulatory. IV removed 

## 2018-11-01 NOTE — Discharge Instructions (Addendum)
Your vaginal swab was positive for trichomonas.  This is a sexually transmitted disease.  Please take the antibiotics as prescribed.  Follow up with your family doc.  Return for fever, worsening pain, inability to eat or drink.

## 2018-11-02 LAB — GC/CHLAMYDIA PROBE AMP (~~LOC~~) NOT AT ARMC
Chlamydia: NEGATIVE
Neisseria Gonorrhea: NEGATIVE

## 2018-11-02 LAB — HIV ANTIBODY (ROUTINE TESTING W REFLEX): HIV Screen 4th Generation wRfx: NONREACTIVE

## 2018-11-02 LAB — RPR: RPR Ser Ql: NONREACTIVE

## 2019-01-02 ENCOUNTER — Encounter (HOSPITAL_COMMUNITY): Payer: Self-pay

## 2019-01-02 ENCOUNTER — Emergency Department (HOSPITAL_COMMUNITY)
Admission: EM | Admit: 2019-01-02 | Discharge: 2019-01-02 | Disposition: A | Discharge status: Home / Self Care | Payer: Medicare HMO | Attending: Emergency Medicine | Admitting: Emergency Medicine

## 2019-01-02 ENCOUNTER — Other Ambulatory Visit: Payer: Self-pay

## 2019-01-02 DIAGNOSIS — Z79899 Other long term (current) drug therapy: Secondary | ICD-10-CM | POA: Insufficient documentation

## 2019-01-02 DIAGNOSIS — E039 Hypothyroidism, unspecified: Secondary | ICD-10-CM | POA: Insufficient documentation

## 2019-01-02 DIAGNOSIS — T50905A Adverse effect of unspecified drugs, medicaments and biological substances, initial encounter: Secondary | ICD-10-CM | POA: Insufficient documentation

## 2019-01-02 DIAGNOSIS — R4182 Altered mental status, unspecified: Secondary | ICD-10-CM | POA: Insufficient documentation

## 2019-01-02 DIAGNOSIS — Z8544 Personal history of malignant neoplasm of other female genital organs: Secondary | ICD-10-CM | POA: Insufficient documentation

## 2019-01-02 DIAGNOSIS — F17210 Nicotine dependence, cigarettes, uncomplicated: Secondary | ICD-10-CM | POA: Diagnosis not present

## 2019-01-02 LAB — CBC
HCT: 38.2 % (ref 36.0–46.0)
Hemoglobin: 12.4 g/dL (ref 12.0–15.0)
MCH: 31.2 pg (ref 26.0–34.0)
MCHC: 32.5 g/dL (ref 30.0–36.0)
MCV: 96.2 fL (ref 80.0–100.0)
Platelets: 170 10*3/uL (ref 150–400)
RBC: 3.97 MIL/uL (ref 3.87–5.11)
RDW: 13.2 % (ref 11.5–15.5)
WBC: 7.1 10*3/uL (ref 4.0–10.5)
nRBC: 0 % (ref 0.0–0.2)

## 2019-01-02 LAB — URINALYSIS, ROUTINE W REFLEX MICROSCOPIC
Glucose, UA: NEGATIVE mg/dL
Hgb urine dipstick: NEGATIVE
Ketones, ur: 5 mg/dL — AB
Nitrite: POSITIVE — AB
Protein, ur: 100 mg/dL — AB
Specific Gravity, Urine: 1.033 — ABNORMAL HIGH (ref 1.005–1.030)
WBC, UA: >50 WBC/hpf — ABNORMAL HIGH (ref 0–5)
pH: 5 (ref 5.0–8.0)

## 2019-01-02 LAB — CBG MONITORING, ED: Glucose-Capillary: 104 mg/dL — ABNORMAL HIGH (ref 70–99)

## 2019-01-02 LAB — I-STAT BETA HCG BLOOD, ED (MC, WL, AP ONLY): I-stat hCG, quantitative: <5 m[IU]/mL (ref <5)

## 2019-01-02 LAB — COMPREHENSIVE METABOLIC PANEL
ALT: 17 U/L (ref 0–44)
AST: 19 U/L (ref 15–41)
Albumin: 3.3 g/dL — ABNORMAL LOW (ref 3.5–5.0)
Alkaline Phosphatase: 56 U/L (ref 38–126)
Anion gap: 6 (ref 5–15)
BUN: 8 mg/dL (ref 6–20)
CO2: 26 mmol/L (ref 22–32)
Calcium: 8.4 mg/dL — ABNORMAL LOW (ref 8.9–10.3)
Chloride: 108 mmol/L (ref 98–111)
Creatinine, Ser: 1.02 mg/dL — ABNORMAL HIGH (ref 0.44–1.00)
GFR calc Af Amer: >60 mL/min (ref >60)
GFR calc non Af Amer: >60 mL/min (ref >60)
Glucose, Bld: 107 mg/dL — ABNORMAL HIGH (ref 70–99)
Potassium: 3.6 mmol/L (ref 3.5–5.1)
Sodium: 140 mmol/L (ref 135–145)
Total Bilirubin: 0.3 mg/dL (ref 0.3–1.2)
Total Protein: 6 g/dL — ABNORMAL LOW (ref 6.5–8.1)

## 2019-01-02 LAB — RAPID URINE DRUG SCREEN, HOSP PERFORMED
Amphetamines: NOT DETECTED
Barbiturates: NOT DETECTED
Benzodiazepines: POSITIVE — AB
Cocaine: POSITIVE — AB
Opiates: NOT DETECTED
Tetrahydrocannabinol: POSITIVE — AB

## 2019-01-02 LAB — ETHANOL: Alcohol, Ethyl (B): <10 mg/dL (ref <10)

## 2019-01-02 MED ORDER — SODIUM CHLORIDE 0.9% FLUSH: 3.0000 mL | Freq: Once | INTRAVENOUS | Status: DC

## 2019-01-02 NOTE — ED Notes (Signed)
Bed: ZZ80 Expected date:  Expected time:  Means of arrival:  Comments: EMS syncope slurring speech

## 2019-01-02 NOTE — ED Provider Notes (Signed)
[facility] DEPT Provider Note   CSN: [id number] Arrival date & time: 01/02/19  0754    History   Chief Complaint Chief Complaint  Patient presents with  . Loss of Consciousness  . Altered Mental Status    HPI [name] is a 43 y.o. female.     HPI Patient is a 43 year old female presents to the emergency department after being found by bystanders sleeping in her car in the grocery store parking lot.  Patient reports that she took her Remeron earlier in the morning secondary to decreased sleep as of lately.  She states she felt like she had enough time to run to the store and back before the Remeron kicked in but realized she was becoming sleepy and thus pulled into the [employer] store parking lot.  She denies suicidal ideation.  She denies alternative ingestions.  She reports taking her normal dose of Remeron this morning.  Clonazepam and Zanaflex were found in the car but patient denies taking these.   Past Medical History:  Diagnosis Date  . Alcohol abuse   . Anemia   . Anxiety   . Arthritis    knees bilateral   DJD  stenosis   . Blood dyscrasia    HSV  . Cancer ([place])    vaginal  malignant carcinoma  . Chronic back pain   . Complication of anesthesia    panic attack, fight or flight  . Drug-seeking behavior   . Endometriosis   . Fibroids   . Fibromyalgia   . Foot fracture    bilateral and toes  . GERD (gastroesophageal reflux disease)    history of  . Headache(784.0)    history of  . Hypothyroidism   . Neuromuscular disorder (HCC)    neuropathy  . PCOS (polycystic ovarian syndrome)   . Polysubstance abuse ([place])   . PONV (postoperative nausea and vomiting)   . Ruptured lumbar disc   . Sleep apnea    cpap  . Thyroid disease     Patient Active Problem List   Diagnosis Date Noted  . Major depressive disorder, recurrent episode, severe, with psychosis ([place]) 02/01/2018  . Major depressive disorder, recurrent, severe  with psychotic features ([place]) 02/01/2018  . ETOH abuse 07/18/2014  . Alcoholic pancreatitis 78/29/5621  . Drug-seeking behavior 07/18/2014  . Acute pancreatitis 07/16/2014  . Alcohol intoxication in active alcoholic without complication ([place])   . Depression   . Polysubstance dependence including opioid type drug, episodic abuse ([place]) 06/26/2014  . Substance induced mood disorder ([place]) 05/03/2014  . Cocaine abuse with intoxication with perceptual disturbance ([place]) 04/12/2014  . Alcohol dependence with uncomplicated withdrawal ([place]) 04/03/2014  . Alcohol dependence with withdrawal with complication ([place]) 30/86/5784  . Herpes labialis 10/08/2013  . Lumbar radiculopathy, chronic 10/08/2013  . Nicotine addiction 07/30/2012  . Fall down stairs 05/27/2012  . Foot fracture 05/27/2012  . Obesity, Class III, BMI 40-49.9 (morbid obesity) ([place]) 10/31/2011  . Mood disorder ([place]) 10/31/2011  . Hypothyroidism 10/31/2011  . Obstructive sleep apnea 10/31/2011  . Narcotic abuse in remission ([place])   . Chronic back pain     Past Surgical History:  Procedure Laterality Date  . ADENOIDECTOMY    . BACK SURGERY     6 times  . CESAREAN SECTION    . DILITATION & CURRETTAGE/HYSTROSCOPY WITH NOVASURE ABLATION N/A 04/28/2017   Procedure: DILATATION & CURETTAGE/HYSTEROSCOPY WITH NOVASURE ABLATION;  Surgeon: Brien Few, MD;  Location: [facility];  Service: Gynecology;  Laterality: N/A;  . TONSILLECTOMY    . TUBAL LIGATION       OB History    Gravida  1   Para  1   Term  1   Preterm      AB      Living        SAB      TAB      Ectopic      Multiple      Live Births               Home Medications    Prior to Admission medications   Medication Sig Start Date End Date Taking? Authorizing Provider  clonazePAM (KLONOPIN) 2 MG tablet Take 2 mg by mouth 2 (two) times daily.    [provider]  doxycycline (VIBRAMYCIN) 100 MG capsule Take 1 capsule (100 mg total) by mouth 2  (two) times daily. One po bid x 7 days 11/01/18   Deno Etienne, DO  DULoxetine (CYMBALTA) 60 MG capsule Take 1 capsule (60 mg total) by mouth 2 (two) times daily. 02/03/18   Mordecai Maes, NP  gabapentin (NEURONTIN) 400 MG capsule Take 1 capsule (400 mg total) by mouth 4 (four) times daily. Patient not taking: Reported on 11/01/2018 02/03/18   Mordecai Maes, NP  gabapentin (NEURONTIN) 800 MG tablet Take 800 mg by mouth 4 (four) times daily.    [provider]  haloperidol (HALDOL) 2 MG tablet Take 1 tablet (2 mg total) by mouth at bedtime. Patient not taking: Reported on 11/01/2018 02/11/18   Ethelene Hal, NP  hydrOXYzine (ATARAX/VISTARIL) 25 MG tablet Take 1 tablet (25 mg total) by mouth every 6 (six) hours as needed (mild/moderate anxiety). Patient not taking: Reported on 11/01/2018 02/03/18   Mordecai Maes, NP  levothyroxine (SYNTHROID, LEVOTHROID) 75 MCG tablet Take 1 tablet (75 mcg total) by mouth daily before breakfast. 08/25/14   Leandrew Koyanagi, MD  ondansetron (ZOFRAN ODT) 4 MG disintegrating tablet 4mg  ODT q4 hours prn nausea/vomit 11/01/18   Deno Etienne, DO  zolpidem (AMBIEN) 10 MG tablet Take 10 mg by mouth at bedtime as needed for sleep.    [provider]    Family History Family History  Problem Relation Age of Onset  . Arthritis Mother   . Clotting disorder Mother   . Diabetes Mother   . Hypertension Mother   . Hyperlipidemia Father   . Heart disease Father   . Prostate cancer Father   . Leukemia Maternal Grandmother   . Diabetes Maternal Grandfather   . Stroke Paternal Grandmother   . Heart disease Paternal Grandmother     Social History Social History   Tobacco Use  . Smoking status: Current Every [place] Smoker    Packs/[place]: 1.00    Years: 20.00    Pack years: 20.00    Types: Cigarettes  . Smokeless tobacco: Never Used  Substance Use Topics  . Alcohol use: No    Comment: denies alcohol since 12/16  . Drug use: Yes    Types: Cocaine,  Amphetamines, Marijuana, Benzodiazepines    Comment: narcotic abuse recoverying addict     Allergies   Quinolones   Review of Systems Review of Systems  All other systems reviewed and are negative.    Physical Exam Updated Vital Signs BP 93/60   Pulse (!) 26   Temp 97.6 F (36.4 C) (Oral)   Resp (!) 28   Wt 131 kg   SpO2 95%   BMI  45.23 kg/m   Physical Exam Vitals signs and nursing note reviewed.  Constitutional:      General: She is not in acute distress.    Appearance: She is well-developed.  HENT:     Head: Normocephalic and atraumatic.  Neck:     Musculoskeletal: Normal range of motion.  Cardiovascular:     Rate and Rhythm: Normal rate and regular rhythm.     Heart sounds: Normal heart sounds.  Pulmonary:     Effort: Pulmonary effort is normal.     Breath sounds: Normal breath sounds.  Abdominal:     General: There is no distension.     Palpations: Abdomen is soft.     Tenderness: There is no abdominal tenderness.  Musculoskeletal: Normal range of motion.  Skin:    General: Skin is warm and dry.  Neurological:     Mental Status: She is alert and oriented to person, place, and time.  Psychiatric:        Judgment: Judgment normal.      ED Treatments / Results  Labs (all labs ordered are listed, but only abnormal results are displayed) Labs Reviewed  COMPREHENSIVE METABOLIC PANEL - Abnormal; Notable for the following components:      Result Value   Glucose, Bld 107 (*)    Creatinine, Ser 1.02 (*)    Calcium 8.4 (*)    Total Protein 6.0 (*)    Albumin 3.3 (*)    All other components within normal limits  CBG MONITORING, ED - Abnormal; Notable for the following components:   Glucose-Capillary 104 (*)    All other components within normal limits  CBC  ETHANOL  URINALYSIS, ROUTINE W REFLEX MICROSCOPIC  RAPID URINE DRUG SCREEN, HOSP PERFORMED  I-STAT BETA HCG BLOOD, ED ([facility], [facility], [facility] ONLY)    EKG None  Radiology No results found.   Procedures Procedures (including critical care time)  Medications Ordered in ED Medications  sodium chloride flush (NS) 0.9 % injection 3 mL (has no administration in time range)     Initial Impression / Assessment and Plan / ED Course  I have reviewed the triage vital signs and the nursing notes.  Pertinent labs & imaging results that were available during my care of the patient were reviewed by me and considered in my medical decision making (see chart for details).        There is soft asleep on arrival to the emergency department.  Her labs are without significant abnormality.  She was observed in the emergency department.  She is now ambulatory in the ER and is alert and oriented x3.  She would like to be discharged home.  This does not appear to be an intentional ingestion.  This is accidental medication side effects and poor timing on the patient's part.  I do not think she needs additional work-up or hospitalization at this time.  She is alert and oriented and able to make her own decisions.  She would like to go home and can safely ambulate in the emergency department without slurred speech.  Discharged home in good condition.  Primary care follow-up.  Patient understands return the emergency department for new or worsening symptoms   Final Clinical Impressions(s) / ED Diagnoses   Final diagnoses:  Medication reaction, initial encounter    ED Discharge Orders    None       Jola Schmidt, MD 01/02/19 1038

## 2019-01-02 NOTE — ED Triage Notes (Addendum)
Pt BIBA from car. Pt was attempting to turn into [employer] and was found by bystander passed out. Pt took remeron and attempted to drive- pt usually takes it before sleep. Pt A&Ox4 but drowsy and slurring speech. Nystagmus in corners of eyes. Pt states she took remeron now since she hasn't slept in 3 days due to chemo. Car did not have any damage noted by EMS Pt has bruising to left lower abd.  Clonazapam, xanaflex in the car.  Pt disoriented to situation with this RN  18 L AC Hx of opiate abuse.

## 2019-02-23 ENCOUNTER — Emergency Department (HOSPITAL_COMMUNITY): Payer: Medicare HMO

## 2019-02-23 ENCOUNTER — Emergency Department (HOSPITAL_COMMUNITY)
Admission: EM | Admit: 2019-02-23 | Discharge: 2019-02-23 | Disposition: A | Discharge status: Home / Self Care | Payer: Medicare HMO | Attending: Emergency Medicine | Admitting: Emergency Medicine

## 2019-02-23 DIAGNOSIS — Y939 Activity, unspecified: Secondary | ICD-10-CM | POA: Diagnosis not present

## 2019-02-23 DIAGNOSIS — R51 Headache: Secondary | ICD-10-CM | POA: Diagnosis not present

## 2019-02-23 DIAGNOSIS — S60512A Abrasion of left hand, initial encounter: Secondary | ICD-10-CM | POA: Diagnosis not present

## 2019-02-23 DIAGNOSIS — S0033XA Contusion of nose, initial encounter: Secondary | ICD-10-CM | POA: Diagnosis not present

## 2019-02-23 DIAGNOSIS — S60511A Abrasion of right hand, initial encounter: Secondary | ICD-10-CM | POA: Insufficient documentation

## 2019-02-23 DIAGNOSIS — M7918 Myalgia, other site: Secondary | ICD-10-CM | POA: Insufficient documentation

## 2019-02-23 DIAGNOSIS — Y999 Unspecified external cause status: Secondary | ICD-10-CM | POA: Diagnosis not present

## 2019-02-23 DIAGNOSIS — Z23 Encounter for immunization: Secondary | ICD-10-CM | POA: Diagnosis not present

## 2019-02-23 DIAGNOSIS — T07XXXA Unspecified multiple injuries, initial encounter: Secondary | ICD-10-CM

## 2019-02-23 DIAGNOSIS — W2210XA Striking against or struck by unspecified automobile airbag, initial encounter: Secondary | ICD-10-CM | POA: Insufficient documentation

## 2019-02-23 DIAGNOSIS — M545 Low back pain: Secondary | ICD-10-CM | POA: Diagnosis not present

## 2019-02-23 DIAGNOSIS — S39012A Strain of muscle, fascia and tendon of lower back, initial encounter: Secondary | ICD-10-CM

## 2019-02-23 DIAGNOSIS — Y929 Unspecified place or not applicable: Secondary | ICD-10-CM | POA: Insufficient documentation

## 2019-02-23 LAB — I-STAT CHEM 8, ED
BUN: 13 mg/dL (ref 6–20)
Calcium, Ion: 1.15 mmol/L (ref 1.15–1.40)
Chloride: 104 mmol/L (ref 98–111)
Creatinine, Ser: 1 mg/dL (ref 0.44–1.00)
Glucose, Bld: 116 mg/dL — ABNORMAL HIGH (ref 70–99)
HCT: 37 % (ref 36.0–46.0)
Hemoglobin: 12.6 g/dL (ref 12.0–15.0)
Potassium: 4.2 mmol/L (ref 3.5–5.1)
Sodium: 139 mmol/L (ref 135–145)
TCO2: 26 mmol/L (ref 22–32)

## 2019-02-23 LAB — COMPREHENSIVE METABOLIC PANEL
ALT: 15 U/L (ref 0–44)
AST: 18 U/L (ref 15–41)
Albumin: 4.2 g/dL (ref 3.5–5.0)
Alkaline Phosphatase: 50 U/L (ref 38–126)
Anion gap: 12 (ref 5–15)
BUN: 11 mg/dL (ref 6–20)
CO2: 20 mmol/L — ABNORMAL LOW (ref 22–32)
Calcium: 9.2 mg/dL (ref 8.9–10.3)
Chloride: 105 mmol/L (ref 98–111)
Creatinine, Ser: 1.13 mg/dL — ABNORMAL HIGH (ref 0.44–1.00)
GFR calc Af Amer: >60 mL/min (ref >60)
GFR calc non Af Amer: 59 mL/min — ABNORMAL LOW (ref >60)
Glucose, Bld: 118 mg/dL — ABNORMAL HIGH (ref 70–99)
Potassium: 4 mmol/L (ref 3.5–5.1)
Sodium: 137 mmol/L (ref 135–145)
Total Bilirubin: 0.4 mg/dL (ref 0.3–1.2)
Total Protein: 6.9 g/dL (ref 6.5–8.1)

## 2019-02-23 LAB — URINALYSIS, ROUTINE W REFLEX MICROSCOPIC
Bilirubin Urine: NEGATIVE
Glucose, UA: NEGATIVE mg/dL
Hgb urine dipstick: NEGATIVE
Ketones, ur: NEGATIVE mg/dL
Leukocytes,Ua: NEGATIVE
Nitrite: NEGATIVE
Protein, ur: NEGATIVE mg/dL
Specific Gravity, Urine: 1.043 — ABNORMAL HIGH (ref 1.005–1.030)
pH: 8 (ref 5.0–8.0)

## 2019-02-23 LAB — CBC
HCT: 38.2 % (ref 36.0–46.0)
Hemoglobin: 13 g/dL (ref 12.0–15.0)
MCH: 31.5 pg (ref 26.0–34.0)
MCHC: 34 g/dL (ref 30.0–36.0)
MCV: 92.5 fL (ref 80.0–100.0)
Platelets: 179 10*3/uL (ref 150–400)
RBC: 4.13 MIL/uL (ref 3.87–5.11)
RDW: 12.6 % (ref 11.5–15.5)
WBC: 7.4 10*3/uL (ref 4.0–10.5)
nRBC: 0 % (ref 0.0–0.2)

## 2019-02-23 LAB — I-STAT BETA HCG BLOOD, ED (MC, WL, AP ONLY): I-stat hCG, quantitative: <5 m[IU]/mL (ref <5)

## 2019-02-23 LAB — SAMPLE TO BLOOD BANK

## 2019-02-23 LAB — CDS SEROLOGY

## 2019-02-23 LAB — ETHANOL: Alcohol, Ethyl (B): <10 mg/dL (ref <10)

## 2019-02-23 LAB — PROTIME-INR
INR: 1.1 (ref 0.8–1.2)
Prothrombin Time: 14.5 seconds (ref 11.4–15.2)

## 2019-02-23 LAB — LACTIC ACID, PLASMA: Lactic Acid, Venous: 1.2 mmol/L (ref 0.5–1.9)

## 2019-02-23 MED ORDER — HYDROMORPHONE HCL 1 MG/ML IJ SOLN
1.0000 mg | Freq: Once | INTRAMUSCULAR | Status: AC
  Administered 2019-02-23: 1 mg via INTRAVENOUS

## 2019-02-23 MED ORDER — LORAZEPAM 2 MG/ML IJ SOLN
INTRAMUSCULAR | Status: AC
  Filled 2019-02-23: qty 1

## 2019-02-23 MED ORDER — IOPAMIDOL (ISOVUE-300) INJECTION 61%
100.0000 mL | Freq: Once | INTRAVENOUS | Status: AC | PRN
  Administered 2019-02-23: 11:00:00 100 mL via INTRAVENOUS

## 2019-02-23 MED ORDER — TETANUS-DIPHTH-ACELL PERTUSSIS 5-2.5-18.5 LF-MCG/0.5 IM SUSP
0.5000 mL | Freq: Once | INTRAMUSCULAR | Status: AC
  Administered 2019-02-23: 0.5 mL via INTRAMUSCULAR
  Filled 2019-02-23: qty 0.5

## 2019-02-23 MED ORDER — HYDROMORPHONE HCL 1 MG/ML IJ SOLN
INTRAMUSCULAR | Status: AC
  Filled 2019-02-23: qty 1

## 2019-02-23 MED ORDER — HYDROMORPHONE HCL 1 MG/ML IJ SOLN
INTRAMUSCULAR | Status: AC | PRN
  Administered 2019-02-23 (×2): 1 mg via INTRAVENOUS

## 2019-02-23 MED ORDER — KETOROLAC TROMETHAMINE 15 MG/ML IJ SOLN
15.0000 mg | Freq: Once | INTRAMUSCULAR | Status: AC
  Administered 2019-02-23: 15 mg via INTRAVENOUS
  Filled 2019-02-23: qty 1

## 2019-02-23 MED ORDER — CYCLOBENZAPRINE HCL 10 MG PO TABS
10.0000 mg | ORAL_TABLET | Freq: Once | ORAL | Status: AC
  Administered 2019-02-23: 10 mg via ORAL
  Filled 2019-02-23: qty 1

## 2019-02-23 MED ORDER — HYDROMORPHONE HCL 1 MG/ML IJ SOLN
1.0000 mg | Freq: Once | INTRAMUSCULAR | Status: AC
  Administered 2019-02-23: 1 mg via INTRAVENOUS
  Filled 2019-02-23: qty 1

## 2019-02-23 MED ORDER — TRAMADOL HCL 50 MG PO TABS: 50.0000 mg | ORAL_TABLET | Freq: Four times a day (QID) | ORAL | 0 refills | Status: DC | PRN

## 2019-02-23 MED ORDER — CYCLOBENZAPRINE HCL 10 MG PO TABS: 10.0000 mg | ORAL_TABLET | Freq: Two times a day (BID) | ORAL | 0 refills | Status: DC | PRN

## 2019-02-23 MED ORDER — LORAZEPAM 2 MG/ML IJ SOLN
2.0000 mg | Freq: Once | INTRAMUSCULAR | Status: AC
  Administered 2019-02-23: 2 mg via INTRAVENOUS

## 2019-02-23 NOTE — Progress Notes (Signed)
Orthopedic Tech Progress Note Patient Details:  [name] [place] 1976-03-08 [id number]  Patient ID: [name] Till, female   DOB: 06/01/1976, 43 y.o.   MRN: [id number]   Sandra Cockayne 2 Trauma 02/23/2019, 9:10 AM

## 2019-02-23 NOTE — ED Notes (Signed)
Pt continues to ask what she is being sent home with for pain. She is receiving medications at this time.
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
## 2019-02-23 NOTE — ED Notes (Signed)
Every dose of pain medication this patient receives she becomes upset and states it is not enough pt has received 100mg  fentanyl  with ems, 1 mg of dilaudid , 2 mg ativan and just now another dose of dilaudid but is still yelling at me outside of her room constantly for for pain medication.
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
## 2019-02-23 NOTE — ED Notes (Signed)
Pt being transported to CT

## 2019-02-23 NOTE — ED Notes (Addendum)
MD plunkett aware this patient continues to yell for more medication at this time no more medication due to the amount we have administered.

## 2019-02-23 NOTE — Discharge Instructions (Addendum)
Take ibuprofen in addition to the muscle relaxer and pain medication.  Call your suboxone clinic today to see if you can get an earlier appointment before next week.  Use hot shower and muscle rubs as needed.
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
## 2019-02-23 NOTE — ED Notes (Signed)
Pt verbalized understanding of discharge instructions and denies any further questions at this time.  Information provided to Mr. [name] father of the patient. She will wear the provided scrubs for d/c. Her mother [name] is outside.

## 2019-03-14 ENCOUNTER — Other Ambulatory Visit: Payer: Self-pay

## 2019-03-14 ENCOUNTER — Encounter (HOSPITAL_COMMUNITY): Payer: Self-pay | Admitting: Emergency Medicine

## 2019-03-14 ENCOUNTER — Ambulatory Visit (HOSPITAL_COMMUNITY)
Admission: EM | Admit: 2019-03-14 | Discharge: 2019-03-14 | Disposition: A | Discharge status: Home / Self Care | Payer: Medicare HMO | Attending: Family Medicine | Admitting: Family Medicine

## 2019-03-14 DIAGNOSIS — J029 Acute pharyngitis, unspecified: Secondary | ICD-10-CM | POA: Diagnosis not present

## 2019-03-14 DIAGNOSIS — J069 Acute upper respiratory infection, unspecified: Secondary | ICD-10-CM | POA: Diagnosis not present

## 2019-03-14 DIAGNOSIS — Z20828 Contact with and (suspected) exposure to other viral communicable diseases: Secondary | ICD-10-CM | POA: Diagnosis not present

## 2019-03-14 DIAGNOSIS — R42 Dizziness and giddiness: Secondary | ICD-10-CM | POA: Diagnosis not present

## 2019-03-14 DIAGNOSIS — R110 Nausea: Secondary | ICD-10-CM | POA: Diagnosis not present

## 2019-03-14 DIAGNOSIS — M791 Myalgia, unspecified site: Secondary | ICD-10-CM | POA: Diagnosis not present

## 2019-03-14 DIAGNOSIS — Z20822 Contact with and (suspected) exposure to covid-19: Secondary | ICD-10-CM

## 2019-03-14 LAB — POCT RAPID STREP A: Streptococcus, Group A Screen (Direct): NEGATIVE

## 2019-03-14 MED ORDER — CYCLOBENZAPRINE HCL 10 MG PO TABS: 10.0000 mg | ORAL_TABLET | Freq: Two times a day (BID) | ORAL | 0 refills | Status: DC | PRN

## 2019-03-14 MED ORDER — ONDANSETRON HCL 4 MG PO TABS: 4.0000 mg | ORAL_TABLET | Freq: Four times a day (QID) | ORAL | 0 refills | Status: DC

## 2019-03-14 MED ORDER — OMEPRAZOLE 20 MG PO CPDR: 20.0000 mg | DELAYED_RELEASE_CAPSULE | Freq: Every day | ORAL | 0 refills | Status: DC

## 2019-03-14 NOTE — ED Triage Notes (Signed)
Pt here for sore throat, fever, body aches x 5 days

## 2019-03-14 NOTE — ED Provider Notes (Signed)
[facility]    CSN: [id number] Arrival date & time: 03/14/19  1347      History   Chief Complaint Chief Complaint  Patient presents with  . Appointment    2:10  . Sore Throat  . Fever    HPI [name] is a 43 y.o. female.   HPI  Patient states she is had some fever.  Body aches.  Chills.  Dizziness.  Laryngitis.  Runny nose.  She states she is also having a lot of heartburn.  She is having a lot of nausea.  She would like a refill of her Zofran.  She would like a PPI.  She is not doing well on Pepcid.  Appetite is poor.  She is trying to drink a lot of fluids. 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  I called her clinic so that they would know that she was sick and not been able to attend her daily meetings. She has not been directly exposed to COVID-19.  She feels like she is around a high risk population.  Past Medical History:  Diagnosis Date  . Alcohol abuse   . Anemia   . Anxiety   . Arthritis    knees bilateral   DJD  stenosis   . Blood dyscrasia    HSV  . Cancer ([place])    vaginal  malignant carcinoma  . Chronic back pain   . Complication of anesthesia    panic attack, fight or flight  . Drug-seeking behavior   . Endometriosis   . Fibroids   . Fibromyalgia   . Foot fracture    bilateral and toes  . GERD (gastroesophageal reflux disease)    history of  . Headache(784.0)    history of  . Hypothyroidism   . Neuromuscular disorder (HCC)    neuropathy  . PCOS (polycystic ovarian syndrome)   . Polysubstance abuse ([place])   . PONV (postoperative nausea and vomiting)   . Ruptured lumbar disc   . Sleep apnea    cpap  . Thyroid disease     Patient Active Problem List   Diagnosis Date Noted  . 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 Major depressive disorder, recurrent, severe with psychotic features ([place]) 02/01/2018  . ETOH abuse 07/18/2014  . Alcoholic pancreatitis 03/09/4817  .  Drug-seeking behavior 07/18/2014  . Acute pancreatitis 07/16/2014  . Alcohol intoxication in active alcoholic without complication ([place])   . Depression   . Polysubstance dependence including opioid type drug, episodic abuse ([place]) 06/26/2014  . Substance induced mood disorder ([place]) 05/03/2014  . Cocaine abuse with intoxication with perceptual disturbance ([place]) 04/12/2014  . Alcohol dependence with uncomplicated withdrawal ([place]) 04/03/2014  . Alcohol dependence with withdrawal with complication ([place]) 56/31/4970  . Herpes labialis 10/08/2013  . Lumbar radiculopathy, chronic 10/08/2013  . Nicotine addiction 07/30/2012  . Fall down stairs 05/27/2012  . Foot fracture 05/27/2012  . Obesity, Class III, BMI 40-49.9 (morbid obesity) ([place]) 10/31/2011  . Mood disorder ([place]) 10/31/2011  . Hypothyroidism 10/31/2011  . Obstructive sleep apnea 10/31/2011  . Narcotic abuse in remission ([place])   . Chronic back pain     Past Surgical History:  Procedure Laterality Date  . ADENOIDECTOMY    . BACK SURGERY     6 times  . CESAREAN SECTION    . DILITATION & CURRETTAGE/HYSTROSCOPY WITH NOVASURE ABLATION N/A 04/28/2017   Procedure: DILATATION & CURETTAGE/HYSTEROSCOPY WITH NOVASURE ABLATION;  Surgeon:  Brien Few, MD;  Location: [facility];  Service: Gynecology;  Laterality: N/A;  . TONSILLECTOMY    . TUBAL LIGATION      OB History    Gravida  1   Para  1   Term  1   Preterm      AB      Living        SAB      TAB      Ectopic      Multiple      Live Births               Home Medications    Prior to Admission medications   Medication Sig Start Date End Date Taking? Authorizing Provider  buprenorphine-naloxone (SUBOXONE) 8-2 mg SUBL SL tablet Place 1 tablet under the tongue daily.    [provider]  clonazePAM (KLONOPIN) 2 MG tablet Take 2 mg by mouth 2 (two) times daily.    [provider]  clonazePAM (KLONOPIN) 2 MG tablet Take 2 mg by mouth 2 (two) times  daily.    [provider]  cyclobenzaprine (FLEXERIL) 10 MG tablet Take 1 tablet (10 mg total) by mouth 2 (two) times daily as needed for muscle spasms. 03/14/19   Raylene Everts, MD  DULoxetine (CYMBALTA) 60 MG capsule Take 1 capsule (60 mg total) by mouth 2 (two) times daily. 02/03/18   Mordecai Maes, NP  DULoxetine (CYMBALTA) 60 MG capsule Take 60 mg by mouth daily.    [provider]  eszopiclone (LUNESTA) 2 MG TABS tablet Take 2 mg by mouth at bedtime as needed for sleep. Take immediately before bedtime    [provider]  gabapentin (NEURONTIN) 300 MG capsule Take 300 mg by mouth 4 (four) times daily.     [provider]  gabapentin (NEURONTIN) 400 MG capsule Take 1 capsule (400 mg total) by mouth 4 (four) times daily. Patient not taking: Reported on 11/01/2018 02/03/18   Mordecai Maes, NP  gabapentin (NEURONTIN) 800 MG tablet Take 800 mg by mouth 4 (four) times daily.    [provider]  haloperidol (HALDOL) 2 MG tablet Take 1 tablet (2 mg total) by mouth at bedtime. Patient not taking: Reported on 11/01/2018 02/11/18   Ethelene Hal, NP  hydrOXYzine (ATARAX/VISTARIL) 25 MG tablet Take 1 tablet (25 mg total) by mouth every 6 (six) hours as needed (mild/moderate anxiety). Patient not taking: Reported on 11/01/2018 02/03/18   Mordecai Maes, NP  lamoTRIgine (LAMICTAL) 100 MG tablet Take 100 mg by mouth daily.     [provider]  levothyroxine (SYNTHROID, LEVOTHROID) 75 MCG tablet Take 1 tablet (75 mcg total) by mouth daily before breakfast. 08/25/14   Leandrew Koyanagi, MD  omeprazole (PRILOSEC) 20 MG capsule Take 1 capsule (20 mg total) by mouth daily. 03/14/19   Raylene Everts, MD  ondansetron (ZOFRAN ODT) 4 MG disintegrating tablet 4mg  ODT q4 hours prn nausea/vomit 11/01/18   Deno Etienne, DO  ondansetron (ZOFRAN) 4 MG tablet Take 1 tablet (4 mg total) by mouth every 6 (six) hours. 03/14/19   Raylene Everts, MD  traMADol  (ULTRAM) 50 MG tablet Take 1 tablet (50 mg total) by mouth every 6 (six) hours as needed. 02/23/19   Blanchie Dessert, MD  zolpidem (AMBIEN) 10 MG tablet Take 1 tablet (10 mg total) by mouth at bedtime as needed for sleep. 10/04/12 11/03/12  Leandrew Koyanagi, MD  zolpidem (AMBIEN) 10 MG tablet Take 10  mg by mouth at bedtime as needed for sleep.    [provider]  zolpidem (AMBIEN) 10 MG tablet Take 10 mg by mouth at bedtime.    [provider]    Family History Family History  Problem Relation Age of Onset  . Arthritis Mother   . Clotting disorder Mother   . Diabetes Mother   . Hypertension Mother   . Hyperlipidemia Father   . Heart disease Father   . Prostate cancer Father   . Leukemia Maternal Grandmother   . Diabetes Maternal Grandfather   . Stroke Paternal Grandmother   . Heart disease Paternal Grandmother     Social History Social History   Tobacco Use  . Smoking status: Current Every [place]ilosa Smoker    Packs/Hochberg: 1.00    Years: 20.00    Pack years: 20.00    Types: Cigarettes  . Smokeless tobacco: Never Used  Substance Use Topics  . Alcohol use: No    Comment: denies alcohol since 12/16  . Drug use: Yes    Types: Cocaine, Amphetamines, Marijuana, Benzodiazepines    Comment: narcotic abuse recoverying addict     Allergies   Quinolones and Quinolones   Review of Systems Review of Systems  Constitutional: Positive for appetite change, chills, fatigue and fever.  HENT: Positive for congestion, postnasal drip, rhinorrhea and sore throat. Negative for ear pain.   Eyes: Negative for pain and visual disturbance.  Respiratory: Negative for cough and shortness of breath.   Cardiovascular: Negative for chest pain and palpitations.  Gastrointestinal: Positive for abdominal pain and nausea. Negative for vomiting.  Genitourinary: Negative for dysuria and hematuria.  Musculoskeletal: Positive for myalgias. Negative for arthralgias and back pain.  Skin:  Negative for color change and rash.  Neurological: Negative for seizures and syncope.  All other systems reviewed and are negative.    Physical Exam Triage Vital Signs ED Triage Vitals [03/14/19 1416]  Enc Vitals Group     BP (!) 134/92     Pulse Rate 77     Resp 18     Temp 98.4 F (36.9 C)     Temp Source Oral     SpO2 98 %     Weight      Height      Head Circumference      Peak Flow      Pain Score 9     Pain Loc      Pain Edu?      Excl. in [place]?    No data found.  Updated Vital Signs BP (!) 134/92 (BP Location: Left Arm)   Pulse 77   Temp 98.4 F (36.9 C) (Oral)   Resp 18   SpO2 98%       Physical Exam Constitutional:      General: She is not in acute distress.    Appearance: She is well-developed.     Comments: Hoarse voice.  Morbidly obese.  Moderately ill  HENT:     Head: Normocephalic and atraumatic.     Right Ear: Tympanic membrane and ear canal normal.     Left Ear: Tympanic membrane and ear canal normal.     Nose: No congestion.     Mouth/Throat:     Pharynx: Uvula midline. No posterior oropharyngeal erythema.     Tonsils: 1+ on the right. 1+ on the left.  Eyes:     Conjunctiva/sclera: Conjunctivae normal.     Pupils: Pupils are equal, round, and reactive to  light.  Neck:     Musculoskeletal: Normal range of motion.  Cardiovascular:     Rate and Rhythm: Normal rate and regular rhythm.     Heart sounds: Normal heart sounds.  Pulmonary:     Effort: Pulmonary effort is normal. No respiratory distress.     Breath sounds: Normal breath sounds.     Comments: Lungs are clear.  Clear rhinorrhea Abdominal:     General: There is no distension.     Palpations: Abdomen is soft.  Musculoskeletal: Normal range of motion.  Lymphadenopathy:     Cervical: No cervical adenopathy.  Skin:    General: Skin is warm and dry.  Neurological:     Mental Status: She is alert.  Psychiatric:        Mood and Affect: Mood normal.        Behavior: Behavior  normal.      UC Treatments / Results  Labs (all labs ordered are listed, but only abnormal results are displayed) Labs Reviewed  NOVEL CORONAVIRUS, NAA (HOSPITAL ORDER, SEND-OUT TO REF LAB)  CULTURE, GROUP A STREP [facility])  POCT RAPID STREP A    EKG   Radiology No results found.  Procedures Procedures (including critical care time)  Medications Ordered in UC Medications - No data to display  Initial Impression / Assessment and Plan / UC Course  I have reviewed the triage vital signs and the nursing notes.  Pertinent labs & imaging results that were available during my care of the patient were reviewed by me and considered in my medical decision making (see chart for details).     I expressed the importance of quarantine until coronavirus testing is available.  I did call her recovery program to arrange for zoom meetings until she is cleared. Final Clinical Impressions(s) / UC Diagnoses   Final diagnoses:  Viral upper respiratory tract infection  Suspected Covid-19 Virus Infection     Discharge Instructions     Take Tylenol for pain and fever You may take over-the-counter cough and cold medicine as needed Strep test is negative Cover test is pending I have refilled your Flexeril I have refilled your Zofran You must quarantine yourself until your COVID test is available I called Dr. [name] office for you     Person Under Monitoring Name: [name] Glassman Chovan  Location: [address] [place] [address]   Infection Prevention Recommendations for Individuals Confirmed to have, or Being Evaluated for, 2019 Novel Coronavirus (COVID-19) Infection Who Receive Care at Home  Individuals who are confirmed to have, or are being evaluated for, COVID-19 should follow the prevention steps below until a healthcare provider or local or state health department says they can return to normal activities.  Stay home except to get medical care You should restrict  activities outside your home, except for getting medical care. Do not go to work, school, or public areas, and do not use public transportation or taxis.  Call ahead before visiting your doctor Before your medical appointment, call the healthcare provider and tell them that you have, or are being evaluated for, COVID-19 infection. This will help the healthcare provider's office take steps to keep other people from getting infected. Ask your healthcare provider to call the local or state health department.  Monitor your symptoms Seek prompt medical attention if your illness is worsening (e.g., difficulty breathing). Before going to your medical appointment, call the healthcare provider and tell them that you have, or are being evaluated for, COVID-19 infection. Ask  your healthcare provider to call the local or state health department.  Wear a facemask You should wear a facemask that covers your nose and mouth when you are in the same room with other people and when you visit a healthcare provider. People who live with or visit you should also wear a facemask while they are in the same room with you.  Separate yourself from other people in your home As much as possible, you should stay in a different room from other people in your home. Also, you should use a separate bathroom, if available.  Avoid sharing household items You should not share dishes, drinking glasses, cups, eating utensils, towels, bedding, or other items with other people in your home. After using these items, you should wash them thoroughly with soap and water.  Cover your coughs and sneezes Cover your mouth and nose with a tissue when you cough or sneeze, or you can cough or sneeze into your sleeve. Throw used tissues in a lined trash can, and immediately wash your hands with soap and water for at least 20 seconds or use an alcohol-based hand rub.  Wash your [facility] your hands often and thoroughly with soap and  water for at least 20 seconds. You can use an alcohol-based hand sanitizer if soap and water are not available and if your hands are not visibly dirty. Avoid touching your eyes, nose, and mouth with unwashed hands.   Prevention Steps for Caregivers and Household Members of Individuals Confirmed to have, or Being Evaluated for, COVID-19 Infection Being Cared for in the Home  If you live with, or provide care at home for, a person confirmed to have, or being evaluated for, COVID-19 infection please follow these guidelines to prevent infection:  Follow healthcare provider's instructions Make sure that you understand and can help the patient follow any healthcare provider instructions for all care.  Provide for the patient's basic needs You should help the patient with basic needs in the home and provide support for getting groceries, prescriptions, and other personal needs.  Monitor the patient's symptoms If they are getting sicker, call his or her medical provider and tell them that the patient has, or is being evaluated for, COVID-19 infection. This will help the healthcare provider's office take steps to keep other people from getting infected. Ask the healthcare provider to call the local or state health department.  Limit the number of people who have contact with the patient  If possible, have only one caregiver for the patient.  Other household members should stay in another home or place of residence. If this is not possible, they should stay  in another room, or be separated from the patient as much as possible. Use a separate bathroom, if available.  Restrict visitors who do not have an essential need to be in the home.  Keep older adults, very young children, and other sick people away from the patient Keep older adults, very young children, and those who have compromised immune systems or chronic health conditions away from the patient. This includes people with chronic  heart, lung, or kidney conditions, diabetes, and cancer.  Ensure good ventilation Make sure that shared spaces in the home have good air flow, such as from an air conditioner or an opened window, weather permitting.  Wash your hands often  Wash your hands often and thoroughly with soap and water for at least 20 seconds. You can use an alcohol based hand sanitizer if soap and water  are not available and if your hands are not visibly dirty.  Avoid touching your eyes, nose, and mouth with unwashed hands.  Use disposable paper towels to dry your hands. If not available, use dedicated cloth towels and replace them when they become wet.  Wear a facemask and gloves  Wear a disposable facemask at all times in the room and gloves when you touch or have contact with the patient's blood, body fluids, and/or secretions or excretions, such as sweat, saliva, sputum, nasal mucus, vomit, urine, or feces.  Ensure the mask fits over your nose and mouth tightly, and do not touch it during use.  Throw out disposable facemasks and gloves after using them. Do not reuse.  Wash your hands immediately after removing your facemask and gloves.  If your personal clothing becomes contaminated, carefully remove clothing and launder. Wash your hands after handling contaminated clothing.  Place all used disposable facemasks, gloves, and other waste in a lined container before disposing them with other household waste.  Remove gloves and wash your hands immediately after handling these items.  Do not share dishes, glasses, or other household items with the patient  Avoid sharing household items. You should not share dishes, drinking glasses, cups, eating utensils, towels, bedding, or other items with a patient who is confirmed to have, or being evaluated for, COVID-19 infection.  After the person uses these items, you should wash them thoroughly with soap and water.  Wash laundry thoroughly  Immediately remove  and wash clothes or bedding that have blood, body fluids, and/or secretions or excretions, such as sweat, saliva, sputum, nasal mucus, vomit, urine, or feces, on them.  Wear gloves when handling laundry from the patient.  Read and follow directions on labels of laundry or clothing items and detergent. In general, wash and dry with the warmest temperatures recommended on the label.  Clean all areas the individual has used often  Clean all touchable surfaces, such as counters, tabletops, doorknobs, bathroom fixtures, toilets, phones, keyboards, tablets, and bedside tables, every [place]. Also, clean any surfaces that may have blood, body fluids, and/or secretions or excretions on them.  Wear gloves when cleaning surfaces the patient has come in contact with.  Use a diluted bleach solution (e.g., dilute bleach with 1 part bleach and 10 parts water) or a household disinfectant with a label that says EPA-registered for coronaviruses. To make a bleach solution at home, add 1 tablespoon of bleach to 1 quart (4 cups) of water. For a larger supply, add  cup of bleach to 1 gallon (16 cups) of water.  Read labels of cleaning products and follow recommendations provided on product labels. Labels contain instructions for safe and effective use of the cleaning product including precautions you should take when applying the product, such as wearing gloves or eye protection and making sure you have good ventilation during use of the product.  Remove gloves and wash hands immediately after cleaning.  Monitor yourself for signs and symptoms of illness Caregivers and household members are considered close contacts, should monitor their health, and will be asked to limit movement outside of the home to the extent possible. Follow the monitoring steps for close contacts listed on the symptom monitoring form.   ? If you have additional questions, contact your local health department or call the epidemiologist on call  at [phone] (available 24/7). ? This guidance is subject to change. For the most up-to-date guidance from [facility], please refer to their website: YouBlogs.pl  ED Prescriptions    Medication Sig Dispense Auth. Provider   cyclobenzaprine (FLEXERIL) 10 MG tablet Take 1 tablet (10 mg total) by mouth 2 (two) times daily as needed for muscle spasms. 20 tablet Raylene Everts, MD   ondansetron (ZOFRAN) 4 MG tablet Take 1 tablet (4 mg total) by mouth every 6 (six) hours. 12 tablet Raylene Everts, MD   omeprazole (PRILOSEC) 20 MG capsule Take 1 capsule (20 mg total) by mouth daily. 30 capsule Raylene Everts, MD     Controlled Substance Prescriptions [place] Controlled Substance Registry consulted? Not Applicable   Raylene Everts, MD 03/14/19 2039

## 2019-03-14 NOTE — Discharge Instructions (Addendum)
Take Tylenol for pain and fever You may take over-the-counter cough and cold medicine as needed Strep test is negative Cover test is pending I have refilled your Flexeril I have refilled your Zofran You must quarantine yourself until your COVID test is available I called Dr. Darla [name] office for you     Person Under Monitoring Name: [name]  Location: [address] [place] [address]   Infection Prevention Recommendations for Individuals Confirmed to have, or Being Evaluated for, 2019 Novel Coronavirus (COVID-19) Infection Who Receive Care at Home  Individuals who are confirmed to have, or are being evaluated for, COVID-19 should follow the prevention steps below until a healthcare provider or local or state health department says they can return to normal activities.  Stay home except to get medical care You should restrict activities outside your home, except for getting medical care. Do not go to work, school, or public areas, and do not use public transportation or taxis.  Call ahead before visiting your doctor Before your medical appointment, call the healthcare provider and tell them that you have, or are being evaluated for, COVID-19 infection. This will help the healthcare providers office take steps to keep other people from getting infected. Ask your healthcare provider to call the local or state health department.  Monitor your symptoms Seek prompt medical attention if your illness is worsening (e.g., difficulty breathing). Before going to your medical appointment, call the healthcare provider and tell them that you have, or are being evaluated for, COVID-19 infection. Ask your healthcare provider to call the local or state health department.  Wear a facemask You should wear a facemask that covers your nose and mouth when you are in the same room with other people and when you visit a healthcare provider. People who live with or visit you should also wear  a facemask while they are in the same room with you.  Separate yourself from other people in your home As much as possible, you should stay in a different room from other people in your home. Also, you should use a separate bathroom, if available.  Avoid sharing household items You should not share dishes, drinking glasses, cups, eating utensils, towels, bedding, or other items with other people in your home. After using these items, you should wash them thoroughly with soap and water.  Cover your coughs and sneezes Cover your mouth and nose with a tissue when you cough or sneeze, or you can cough or sneeze into your sleeve. Throw used tissues in a lined trash can, and immediately wash your hands with soap and water for at least 20 seconds or use an alcohol-based hand rub.  Wash your [facility] your hands often and thoroughly with soap and water for at least 20 seconds. You can use an alcohol-based hand sanitizer if soap and water are not available and if your hands are not visibly dirty. Avoid touching your eyes, nose, and mouth with unwashed hands.   Prevention Steps for Caregivers and Household Members of Individuals Confirmed to have, or Being Evaluated for, COVID-19 Infection Being Cared for in the Home  If you live with, or provide care at home for, a person confirmed to have, or being evaluated for, COVID-19 infection please follow these guidelines to prevent infection:  Follow healthcare providers instructions Make sure that you understand and can help the patient follow any healthcare provider instructions for all care.  Provide for the patients basic needs You should help the patient with basic  needs in the home and provide support for getting groceries, prescriptions, and other personal needs.  Monitor the patients symptoms If they are getting sicker, call his or her medical provider and tell them that the patient has, or is being evaluated for, COVID-19 infection.  This will help the healthcare providers office take steps to keep other people from getting infected. Ask the healthcare provider to call the local or state health department.  Limit the number of people who have contact with the patient If possible, have only one caregiver for the patient. Other household members should stay in another home or place of residence. If this is not possible, they should stay in another room, or be separated from the patient as much as possible. Use a separate bathroom, if available. Restrict visitors who do not have an essential need to be in the home.  Keep older adults, very young children, and other sick people away from the patient Keep older adults, very young children, and those who have compromised immune systems or chronic health conditions away from the patient. This includes people with chronic heart, lung, or kidney conditions, diabetes, and cancer.  Ensure good ventilation Make sure that shared spaces in the home have good air flow, such as from an air conditioner or an opened window, weather permitting.  Wash your hands often Wash your hands often and thoroughly with soap and water for at least 20 seconds. You can use an alcohol based hand sanitizer if soap and water are not available and if your hands are not visibly dirty. Avoid touching your eyes, nose, and mouth with unwashed hands. Use disposable paper towels to dry your hands. If not available, use dedicated cloth towels and replace them when they become wet.  Wear a facemask and gloves Wear a disposable facemask at all times in the room and gloves when you touch or have contact with the patients blood, body fluids, and/or secretions or excretions, such as sweat, saliva, sputum, nasal mucus, vomit, urine, or feces.  Ensure the mask fits over your nose and mouth tightly, and do not touch it during use. Throw out disposable facemasks and gloves after using them. Do not reuse. Wash your hands  immediately after removing your facemask and gloves. If your personal clothing becomes contaminated, carefully remove clothing and launder. Wash your hands after handling contaminated clothing. Place all used disposable facemasks, gloves, and other waste in a lined container before disposing them with other household waste. Remove gloves and wash your hands immediately after handling these items.  Do not share dishes, glasses, or other household items with the patient Avoid sharing household items. You should not share dishes, drinking glasses, cups, eating utensils, towels, bedding, or other items with a patient who is confirmed to have, or being evaluated for, COVID-19 infection. After the person uses these items, you should wash them thoroughly with soap and water.  Wash laundry thoroughly Immediately remove and wash clothes or bedding that have blood, body fluids, and/or secretions or excretions, such as sweat, saliva, sputum, nasal mucus, vomit, urine, or feces, on them. Wear gloves when handling laundry from the patient. Read and follow directions on labels of laundry or clothing items and detergent. In general, wash and dry with the warmest temperatures recommended on the label.  Clean all areas the individual has used often Clean all touchable surfaces, such as counters, tabletops, doorknobs, bathroom fixtures, toilets, phones, keyboards, tablets, and bedside tables, every [place]. Also, clean any surfaces that may have blood,  body fluids, and/or secretions or excretions on them. Wear gloves when cleaning surfaces the patient has come in contact with. Use a diluted bleach solution (e.g., dilute bleach with 1 part bleach and 10 parts water) or a household disinfectant with a label that says EPA-registered for coronaviruses. To make a bleach solution at home, add 1 tablespoon of bleach to 1 quart (4 cups) of water. For a larger supply, add  cup of bleach to 1 gallon (16 cups) of water. Read  labels of cleaning products and follow recommendations provided on product labels. Labels contain instructions for safe and effective use of the cleaning product including precautions you should take when applying the product, such as wearing gloves or eye protection and making sure you have good ventilation during use of the product. Remove gloves and wash hands immediately after cleaning.  Monitor yourself for signs and symptoms of illness Caregivers and household members are considered close contacts, should monitor their health, and will be asked to limit movement outside of the home to the extent possible. Follow the monitoring steps for close contacts listed on the symptom monitoring form.   ? If you have additional questions, contact your local health department or call the epidemiologist on call at [phone] (available 24/7). ? This guidance is subject to change. For the most up-to-date guidance from [facility], please refer to their website: [email or web address]

## 2019-03-15 LAB — NOVEL CORONAVIRUS, NAA (HOSP ORDER, SEND-OUT TO REF LAB; TAT 18-24 HRS): SARS-CoV-2, NAA: NOT DETECTED

## 2019-03-17 LAB — CULTURE, GROUP A STREP (THRC)

## 2019-03-18 ENCOUNTER — Telehealth (HOSPITAL_COMMUNITY): Payer: Self-pay

## 2019-03-18 NOTE — Telephone Encounter (Signed)
Pt called in to receive her results from her covid-19 test.

## 2019-04-20 ENCOUNTER — Emergency Department (HOSPITAL_COMMUNITY)
Admission: EM | Admit: 2019-04-20 | Discharge: 2019-04-21 | Disposition: A | Discharge status: Home / Self Care | Payer: Medicare HMO | Attending: Emergency Medicine | Admitting: Emergency Medicine

## 2019-04-20 ENCOUNTER — Encounter (HOSPITAL_COMMUNITY): Payer: Self-pay | Admitting: Emergency Medicine

## 2019-04-20 ENCOUNTER — Other Ambulatory Visit: Payer: Self-pay

## 2019-04-20 DIAGNOSIS — E039 Hypothyroidism, unspecified: Secondary | ICD-10-CM | POA: Insufficient documentation

## 2019-04-20 DIAGNOSIS — F17210 Nicotine dependence, cigarettes, uncomplicated: Secondary | ICD-10-CM | POA: Insufficient documentation

## 2019-04-20 DIAGNOSIS — Y900 Blood alcohol level of less than 20 mg/100 ml: Secondary | ICD-10-CM | POA: Insufficient documentation

## 2019-04-20 DIAGNOSIS — F10120 Alcohol abuse with intoxication, uncomplicated: Secondary | ICD-10-CM | POA: Diagnosis not present

## 2019-04-20 DIAGNOSIS — R4182 Altered mental status, unspecified: Secondary | ICD-10-CM

## 2019-04-20 DIAGNOSIS — Z79899 Other long term (current) drug therapy: Secondary | ICD-10-CM | POA: Insufficient documentation

## 2019-04-20 HISTORY — DX: Unspecified convulsions: R56.9

## 2019-04-20 LAB — COMPREHENSIVE METABOLIC PANEL
ALT: 44 U/L (ref 0–44)
AST: 37 U/L (ref 15–41)
Albumin: 3.8 g/dL (ref 3.5–5.0)
Alkaline Phosphatase: 53 U/L (ref 38–126)
Anion gap: 9 (ref 5–15)
BUN: 13 mg/dL (ref 6–20)
CO2: 27 mmol/L (ref 22–32)
Calcium: 9.8 mg/dL (ref 8.9–10.3)
Chloride: 101 mmol/L (ref 98–111)
Creatinine, Ser: 1.02 mg/dL — ABNORMAL HIGH (ref 0.44–1.00)
GFR calc Af Amer: >60 mL/min (ref >60)
GFR calc non Af Amer: >60 mL/min (ref >60)
Glucose, Bld: 141 mg/dL — ABNORMAL HIGH (ref 70–99)
Potassium: 4.5 mmol/L (ref 3.5–5.1)
Sodium: 137 mmol/L (ref 135–145)
Total Bilirubin: 0.6 mg/dL (ref 0.3–1.2)
Total Protein: 6.4 g/dL — ABNORMAL LOW (ref 6.5–8.1)

## 2019-04-20 LAB — CBC WITH DIFFERENTIAL/PLATELET
Abs Immature Granulocytes: 0.04 10*3/uL (ref 0.00–0.07)
Basophils Absolute: 0.1 10*3/uL (ref 0.0–0.1)
Basophils Relative: 1 %
Eosinophils Absolute: 0.1 10*3/uL (ref 0.0–0.5)
Eosinophils Relative: 1 %
HCT: 36.8 % (ref 36.0–46.0)
Hemoglobin: 12.9 g/dL (ref 12.0–15.0)
Immature Granulocytes: 1 %
Lymphocytes Relative: 19 %
Lymphs Abs: 1.4 10*3/uL (ref 0.7–4.0)
MCH: 32.2 pg (ref 26.0–34.0)
MCHC: 35.1 g/dL (ref 30.0–36.0)
MCV: 91.8 fL (ref 80.0–100.0)
Monocytes Absolute: 0.4 10*3/uL (ref 0.1–1.0)
Monocytes Relative: 6 %
Neutro Abs: 5.1 10*3/uL (ref 1.7–7.7)
Neutrophils Relative %: 72 %
Platelets: 161 10*3/uL (ref 150–400)
RBC: 4.01 MIL/uL (ref 3.87–5.11)
RDW: 13 % (ref 11.5–15.5)
WBC: 7.1 10*3/uL (ref 4.0–10.5)
nRBC: 0 % (ref 0.0–0.2)

## 2019-04-20 LAB — I-STAT BETA HCG BLOOD, ED (MC, WL, AP ONLY): I-stat hCG, quantitative: <5 m[IU]/mL (ref <5)

## 2019-04-20 MED ORDER — SODIUM CHLORIDE 0.9 % IV BOLUS
1000.0000 mL | Freq: Once | INTRAVENOUS | Status: AC
  Administered 2019-04-20: 1000 mL via INTRAVENOUS

## 2019-04-20 NOTE — ED Provider Notes (Signed)
11:16 PM Care assumed from Dr. Roslynn Amble, patient clinically intoxicated, labs pending.  2:23 AM Ethanol is nondetectable.  Patient reevaluated and is still extremely somnolent.  She denies any illicit drug use, but urine drug screen is pending.  On review of her medications, she is on numerous psychoactive medications which could contribute to drowsiness including buprenorphine, clonazepam, cyclobenzaprine, duloxetine, gabapentin, haloperidol, hydroxyzine, lamotrigine, tramadol, zolpidem.  Will send for CT of head and will await urine drug screen.  4:41 AM CT head is unremarkable.  Patient has refused to give urine sample and also refused in and out catheterization.  However, she is felt to be safe for discharge.  She is advised to discuss with PCP if her medications can be adjusted to minimize possibility of altered mentation and drowsiness.  Results for orders placed or performed during the hospital encounter of 04/20/19  CBC with Differential  Result Value Ref Range   WBC 7.1 4.0 - 10.5 K/uL   RBC 4.01 3.87 - 5.11 MIL/uL   Hemoglobin 12.9 12.0 - 15.0 g/dL   HCT 36.8 36.0 - 46.0 %   MCV 91.8 80.0 - 100.0 fL   MCH 32.2 26.0 - 34.0 pg   MCHC 35.1 30.0 - 36.0 g/dL   RDW 13.0 11.5 - 15.5 %   Platelets 161 150 - 400 K/uL   nRBC 0.0 0.0 - 0.2 %   Neutrophils Relative % 72 %   Neutro Abs 5.1 1.7 - 7.7 K/uL   Lymphocytes Relative 19 %   Lymphs Abs 1.4 0.7 - 4.0 K/uL   Monocytes Relative 6 %   Monocytes Absolute 0.4 0.1 - 1.0 K/uL   Eosinophils Relative 1 %   Eosinophils Absolute 0.1 0.0 - 0.5 K/uL   Basophils Relative 1 %   Basophils Absolute 0.1 0.0 - 0.1 K/uL   Immature Granulocytes 1 %   Abs Immature Granulocytes 0.04 0.00 - 0.07 K/uL  Comprehensive metabolic panel  Result Value Ref Range   Sodium 137 135 - 145 mmol/L   Potassium 4.5 3.5 - 5.1 mmol/L   Chloride 101 98 - 111 mmol/L   CO2 27 22 - 32 mmol/L   Glucose, Bld 141 (H) 70 - 99 mg/dL   BUN 13 6 - 20 mg/dL   Creatinine,  Ser 1.02 (H) 0.44 - 1.00 mg/dL   Calcium 9.8 8.9 - 10.3 mg/dL   Total Protein 6.4 (L) 6.5 - 8.1 g/dL   Albumin 3.8 3.5 - 5.0 g/dL   AST 37 15 - 41 U/L   ALT 44 0 - 44 U/L   Alkaline Phosphatase 53 38 - 126 U/L   Total Bilirubin 0.6 0.3 - 1.2 mg/dL   GFR calc non Af Amer >60 >60 mL/min   GFR calc Af Amer >60 >60 mL/min   Anion gap 9 5 - 15  Ethanol  Result Value Ref Range   Alcohol, Ethyl (B) <10 <10 mg/dL  Acetaminophen level  Result Value Ref Range   Acetaminophen (Tylenol), Serum <10 (L) 10 - 30 ug/mL  Salicylate level  Result Value Ref Range   Salicylate Lvl [id number] 2.8 - 30.0 mg/dL  I-Stat Beta hCG blood, ED ([facility], [facility], [facility] only)  Result Value Ref Range   I-stat hCG, quantitative <5.0 <5 mIU/mL   Comment 3           Ct Head Wo Contrast  Result Date: 04/21/2019 CLINICAL DATA:  43 year old female with altered mental status. EXAM: CT HEAD WITHOUT CONTRAST TECHNIQUE: Contiguous axial images were  obtained from the base of the skull through the vertex without intravenous contrast. COMPARISON:  Head CT dated 02/23/2019 FINDINGS: Brain: Mild cortical atrophy, advanced for patient's age. The gray-white matter discrimination is preserved. There is no acute intracranial hemorrhage. No mass effect or midline shift. No extra-axial fluid collection. Vascular: No hyperdense vessel or unexpected calcification. Skull: Normal. Negative for fracture or focal lesion. Sinuses/Orbits: No acute finding. Other: None IMPRESSION: 1. No acute intracranial pathology. 2. Mild cortical atrophy, advanced for patient's age. Electronically Signed   By: Anner Crete M.D.   On: 04/21/2019 03:48   Images viewed by me.    Delora Fuel, MD [id number] [phone]
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
## 2019-04-21 ENCOUNTER — Emergency Department (HOSPITAL_COMMUNITY): Payer: Medicare HMO

## 2019-04-21 LAB — ACETAMINOPHEN LEVEL: Acetaminophen (Tylenol), Serum: <10 ug/mL — ABNORMAL LOW (ref 10–30)

## 2019-04-21 LAB — ETHANOL: Alcohol, Ethyl (B): <10 mg/dL (ref <10)

## 2019-04-21 LAB — SALICYLATE LEVEL: Salicylate Lvl: <7 mg/dL (ref 2.8–30.0)
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
## 2019-04-21 NOTE — ED Notes (Signed)
EDP notified that pt. refused in and out cath , agitation and wanting to leave .

## 2019-04-21 NOTE — ED Notes (Signed)
Patient transported to CT scan . 

## 2019-04-21 NOTE — ED Notes (Signed)
Patient refused in and out cath 

## 2019-05-23 ENCOUNTER — Other Ambulatory Visit: Payer: Self-pay

## 2019-05-23 ENCOUNTER — Ambulatory Visit (INDEPENDENT_AMBULATORY_CARE_PROVIDER_SITE_OTHER): Payer: Medicare HMO | Admitting: Adult Health

## 2019-05-23 ENCOUNTER — Encounter: Payer: Self-pay | Admitting: Adult Health

## 2019-05-23 VITALS — BP 167/99 | HR 95 | Ht 67.0 in | Wt 280.0 lb

## 2019-05-23 DIAGNOSIS — F331 Major depressive disorder, recurrent, moderate: Secondary | ICD-10-CM | POA: Diagnosis not present

## 2019-05-23 DIAGNOSIS — F1010 Alcohol abuse, uncomplicated: Secondary | ICD-10-CM | POA: Diagnosis not present

## 2019-05-23 DIAGNOSIS — F411 Generalized anxiety disorder: Secondary | ICD-10-CM

## 2019-05-23 DIAGNOSIS — F39 Unspecified mood [affective] disorder: Secondary | ICD-10-CM

## 2019-05-23 DIAGNOSIS — F1120 Opioid dependence, uncomplicated: Secondary | ICD-10-CM

## 2019-05-23 DIAGNOSIS — F1920 Other psychoactive substance dependence, uncomplicated: Secondary | ICD-10-CM

## 2019-05-23 MED ORDER — LAMOTRIGINE 100 MG PO TABS: 100.0000 mg | ORAL_TABLET | Freq: Every day | ORAL | 2 refills | Status: DC

## 2019-05-23 MED ORDER — DULOXETINE HCL 60 MG PO CPEP: 60.0000 mg | ORAL_CAPSULE | Freq: Two times a day (BID) | ORAL | 2 refills | Status: DC

## 2019-05-23 MED ORDER — GABAPENTIN 600 MG PO TABS: 600.0000 mg | ORAL_TABLET | Freq: Four times a day (QID) | ORAL | 2 refills | Status: DC

## 2019-05-23 NOTE — Progress Notes (Signed)
[facility] MD/PA/NP Initial Note  05/23/2019 12:58 PM Larena Glassman Stemler  MRN:  [id number]  Chief Complaint:   HPI:   Describes mood today as "ok". Pleasant. Mood symptoms - denies depression, anxiety, and irritability. Stating "I'm doing pretty good right now". Just graduated from [facility]. Looking for "after" care. History of opiod addiction with multiple relapses. Has 2 more days of Suboxone - has been on it 3 and 1/2 years. Concerned about weight gain with Lamictal . Stable interest and motivation. Taking medications as prescribed.  Energy levels stable. Active, has started an exercise routine x 3 weeks. Disabled. Enjoys some usual interests and activities. Lives alone - has partial custody of 10 year old "son" transgender. Disabled. 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 Parents local and supportive.  Appetite adequate.  Weight gain - 60 pounds in 4 months after starting Lamictal.  Sleeping difficulties - has chronic insomnia. Averages 4 to 5 hours. Goes 2 to 3 nights without sleep and then "crashes". Diagnosed with sleep apnea 6 years ago. Uses CPAP machine. Focus and concentration stable. Completing tasks. Managing aspects of household. Volunteering. Has been getting drug screens twice a week and would like to continue for accountability. Denies SI or HI. Denies AH or VH. Weight today - 282.  Visit Diagnosis:    ICD-10-CM   1. Major depressive disorder, recurrent episode, moderate (HCC)  F33.1 DULoxetine (CYMBALTA) 60 MG capsule    DISCONTINUED: DULoxetine (CYMBALTA) 60 MG capsule  2. Generalized anxiety disorder  F41.1 DULoxetine (CYMBALTA) 60 MG capsule    DISCONTINUED: DULoxetine (CYMBALTA) 60 MG capsule  3. Alcohol abuse  F10.10   4. Polysubstance dependence including opioid type drug, episodic abuse (HCC)  F11.20 Drug Screen, Urine [[facility]]   F19.20   5. Episodic mood disorder (HCC)  F39 lamoTRIgine (LAMICTAL) 100 MG tablet    gabapentin (NEURONTIN) 600 MG tablet   DULoxetine (CYMBALTA) 60 MG capsule    DISCONTINUED: DULoxetine (CYMBALTA) 60 MG capsule    Past Psychiatric History: Previous admissions for detox - recovery x 10 years - relapsed multiple times.  Past Medical History:  Past Medical History:  Diagnosis Date  . Alcohol abuse   . Alcohol abuse   . Anemia   . Anxiety   . Arthritis    knees bilateral   DJD  stenosis   . Blood dyscrasia    HSV  . Cancer ([place])    vaginal  malignant carcinoma  . Chronic back pain   . Complication of anesthesia    panic attack, fight or flight  . Drug-seeking behavior   . Endometriosis   . Fibroids   . Fibromyalgia   . Foot fracture    bilateral and toes  . GERD (gastroesophageal reflux disease)    history of  . Headache(784.0)    history of  . Hypothyroidism   . Neuromuscular disorder (HCC)    neuropathy  . PCOS (polycystic ovarian syndrome)   . Polysubstance abuse ([place])   . PONV (postoperative nausea and vomiting)   . Ruptured lumbar disc   . Seizures ([place])   . Sleep apnea    cpap  . Thyroid disease     Past Surgical History:  Procedure Laterality Date  . ADENOIDECTOMY    . BACK SURGERY     6 times  . CESAREAN SECTION    . DILITATION & CURRETTAGE/HYSTROSCOPY WITH NOVASURE ABLATION N/A 04/28/2017   Procedure: DILATATION & CURETTAGE/HYSTEROSCOPY WITH NOVASURE ABLATION;  Surgeon: Brien Few, MD;  Location: [facility];  Service: Gynecology;  Laterality: N/A;  . TONSILLECTOMY    . TUBAL LIGATION      Family Psychiatric History: Most of the women are Bipolar or an addict.   Family History:  Family History  Problem Relation Age of Onset  . Arthritis Mother   . Clotting disorder Mother   . Diabetes Mother   . Hypertension Mother   . Hyperlipidemia Father   . Heart disease Father   . Prostate cancer Father   . Leukemia Maternal Grandmother   . Diabetes Maternal Grandfather   . Stroke Paternal Grandmother   . Heart disease Paternal Grandmother     Social History:  Social  History   Socioeconomic History  . Marital status: Divorced    Spouse name: Not on file  . Number of children: Not on file  . Years of education: Not on file  . Highest education level: Not on file  Occupational History  . Occupation: disability    Comment: Engineer, technical sales at substance abuse counselor at [place]  . Financial resource strain: Not on file  . Food insecurity    Worry: Not on file    Inability: Not on file  . Transportation needs    Medical: Not on file    Non-medical: Not on file  Tobacco Use  . Smoking status: Current Every [place]auder Smoker    Packs/Wohl: 1.00    Years: 20.00    Pack years: 20.00    Types: Cigarettes  . Smokeless tobacco: Never Used  Substance and Sexual Activity  . Alcohol use: No    Comment: denies alcohol since 12/16  . Drug use: Yes    Types: Cocaine, Amphetamines, Marijuana, Benzodiazepines    Comment: narcotic abuse recoverying addict  . Sexual activity: Yes    Birth control/protection: Surgical  Lifestyle  . Physical activity    Days per week: Not on file    Minutes per session: Not on file  . Stress: Not on file  Relationships  . Social Herbalist on phone: Not on file    Gets together: Not on file    Attends religious service: Not on file    Active member of club or organization: Not on file    Attends meetings of clubs or organizations: Not on file    Relationship status: Not on file  Other Topics Concern  . Not on file  Social History Narrative  . Not on file    Allergies:  Allergies  Allergen Reactions  . Avelox [Moxifloxacin] Hives  . Quinolones Hives    Hives with moxifloxacin and levofloxacin  . Levaquin [Levofloxacin] Hives    Metabolic Disorder Labs: Lab Results  Component Value Date   HGBA1C 5.3 07/17/2014   MPG 105 07/17/2014   MPG 111 05/03/2014   No results found for: PROLACTIN Lab Results  Component Value Date   CHOL 235 (H) 05/03/2014   TRIG 477 (H) 05/03/2014   HDL  59 05/03/2014   CHOLHDL 4.0 05/03/2014   VLDL UNABLE TO CALCULATE IF TRIGLYCERIDE OVER 400 mg/dL 05/03/2014   LDLCALC UNABLE TO CALCULATE IF TRIGLYCERIDE OVER 400 mg/dL 05/03/2014   Lab Results  Component Value Date   TSH 7.025 (H) 07/17/2014   TSH 2.640 05/03/2014    Therapeutic Level Labs: No results found for: LITHIUM No results found for: VALPROATE No components found for:  CBMZ  Current Medications: Current Outpatient Medications  Medication Sig Dispense Refill  . buprenorphine-naloxone (SUBOXONE) 8-2 mg  SUBL SL tablet Place 1 tablet under the tongue daily.    . clonazePAM (KLONOPIN) 2 MG tablet Take 2 mg by mouth 2 (two) times daily.    . clonazePAM (KLONOPIN) 2 MG tablet Take 2 mg by mouth 2 (two) times daily.    . cyclobenzaprine (FLEXERIL) 10 MG tablet Take 1 tablet (10 mg total) by mouth 2 (two) times daily as needed for muscle spasms. 20 tablet 0  . DULoxetine (CYMBALTA) 60 MG capsule Take 60 mg by mouth daily.    . DULoxetine (CYMBALTA) 60 MG capsule Take 1 capsule (60 mg total) by mouth 2 (two) times daily. 60 capsule 2  . eszopiclone (LUNESTA) 2 MG TABS tablet Take 2 mg by mouth at bedtime as needed for sleep. Take immediately before bedtime    . gabapentin (NEURONTIN) 300 MG capsule Take 300 mg by mouth 4 (four) times daily.     Marland Kitchen gabapentin (NEURONTIN) 400 MG capsule Take 1 capsule (400 mg total) by mouth 4 (four) times daily. 120 capsule 0  . gabapentin (NEURONTIN) 600 MG tablet Take 1 tablet (600 mg total) by mouth 4 (four) times daily. 120 tablet 2  . haloperidol (HALDOL) 2 MG tablet Take 1 tablet (2 mg total) by mouth at bedtime. 7 tablet 0  . hydrOXYzine (ATARAX/VISTARIL) 25 MG tablet Take 1 tablet (25 mg total) by mouth every 6 (six) hours as needed (mild/moderate anxiety). 30 tablet 0  . lamoTRIgine (LAMICTAL) 100 MG tablet Take 1 tablet (100 mg total) by mouth daily. 30 tablet 2  . levothyroxine (SYNTHROID, LEVOTHROID) 75 MCG tablet Take 1 tablet (75 mcg total)  by mouth daily before breakfast. 90 tablet 0  . omeprazole (PRILOSEC) 20 MG capsule Take 1 capsule (20 mg total) by mouth daily. 30 capsule 0  . ondansetron (ZOFRAN ODT) 4 MG disintegrating tablet 4mg  ODT q4 hours prn nausea/vomit 20 tablet 0  . ondansetron (ZOFRAN) 4 MG tablet Take 1 tablet (4 mg total) by mouth every 6 (six) hours. 12 tablet 0  . traMADol (ULTRAM) 50 MG tablet Take 1 tablet (50 mg total) by mouth every 6 (six) hours as needed. 15 tablet 0  . zolpidem (AMBIEN) 10 MG tablet Take 1 tablet (10 mg total) by mouth at bedtime as needed for sleep. 15 tablet 1  . zolpidem (AMBIEN) 10 MG tablet Take 10 mg by mouth at bedtime as needed for sleep.    Marland Kitchen zolpidem (AMBIEN) 10 MG tablet Take 10 mg by mouth at bedtime.     No current facility-administered medications for this visit.     Medication Side Effects: none  Orders placed this visit:   Orders Placed This Encounter  Procedures  . Drug Screen, Urine [[facility]]    Psychiatric Specialty Exam:  ROS  Blood pressure (!) 167/99, pulse 95, height 5\' 7"  (1.702 m), weight 280 lb (127 kg).Body mass index is 43.85 kg/m.  General Appearance: Neat and Well Groomed  Eye Contact:  Good  Speech:  Clear and Coherent  Volume:  Normal  Mood:  Euthymic  Affect:  Congruent  Thought Process:  Coherent  Orientation:  Full (Time, Place, and Person)  Thought Content: Logical   Suicidal Thoughts:  No  Homicidal Thoughts:  No  Memory:  WNL  Judgement:  Good  Insight:  Good  Psychomotor Activity:  Normal  Concentration:  Concentration: Good  Recall:  Good  Fund of Knowledge: Good  Language: Good  Assets:  Communication Skills Desire for Improvement Financial Resources/Insurance Housing  Intimacy Leisure Time Physical Health Resilience Social Support Talents/Skills Transportation Vocational/Educational  ADL's:  Intact  Cognition: WNL  Prognosis:  Good   Screenings:  AIMS     Admission (Discharged) from 02/01/2018 in [facility]  AIMS Total Score  0    AUDIT     Admission (Discharged) from 02/01/2018 in [facility] Admission (Discharged) from 06/26/2014 in [facility] Admission (Discharged) from 05/03/2014 in [facility]  Alcohol Use Disorder Identification Test Final Score (AUDIT)  0  31  38      Receiving Psychotherapy: Yes   Treatment Plan/Recommendations:   Plan:  1. Lamictal 100mg  at hs 2. Cymbalta 60mg  BID 3. Gabapentin 600 - 4 times daily   Continue biweekly drug screening   Synthroid 0.66mcg  Set up with therapist    RTC 4 weeks  Patient advised to contact office with any questions, adverse effects, or acute worsening in signs and symptoms.    Aloha Gell, NP

## 2019-06-05 ENCOUNTER — Emergency Department (HOSPITAL_COMMUNITY)
Admission: EM | Admit: 2019-06-05 | Discharge: 2019-06-05 | Discharge status: Against Medical Advice | Payer: Medicare HMO | Attending: Emergency Medicine | Admitting: Emergency Medicine

## 2019-06-05 ENCOUNTER — Other Ambulatory Visit: Payer: Self-pay

## 2019-06-05 ENCOUNTER — Telehealth: Payer: Self-pay | Admitting: Psychiatry

## 2019-06-05 ENCOUNTER — Encounter (HOSPITAL_COMMUNITY): Payer: Self-pay

## 2019-06-05 DIAGNOSIS — Z79899 Other long term (current) drug therapy: Secondary | ICD-10-CM | POA: Insufficient documentation

## 2019-06-05 DIAGNOSIS — F17210 Nicotine dependence, cigarettes, uncomplicated: Secondary | ICD-10-CM | POA: Diagnosis not present

## 2019-06-05 DIAGNOSIS — Z5320 Procedure and treatment not carried out because of patient's decision for unspecified reasons: Secondary | ICD-10-CM | POA: Insufficient documentation

## 2019-06-05 DIAGNOSIS — Z20828 Contact with and (suspected) exposure to other viral communicable diseases: Secondary | ICD-10-CM | POA: Diagnosis not present

## 2019-06-05 DIAGNOSIS — R519 Headache, unspecified: Secondary | ICD-10-CM | POA: Insufficient documentation

## 2019-06-05 DIAGNOSIS — F1120 Opioid dependence, uncomplicated: Secondary | ICD-10-CM

## 2019-06-05 DIAGNOSIS — R112 Nausea with vomiting, unspecified: Secondary | ICD-10-CM | POA: Insufficient documentation

## 2019-06-05 DIAGNOSIS — E039 Hypothyroidism, unspecified: Secondary | ICD-10-CM | POA: Diagnosis not present

## 2019-06-05 LAB — SARS CORONAVIRUS 2 (TAT 6-24 HRS): SARS Coronavirus 2: NEGATIVE

## 2019-06-05 MED ORDER — ONDANSETRON 4 MG PO TBDP
4.0000 mg | ORAL_TABLET | Freq: Once | ORAL | Status: AC
  Administered 2019-06-05: 4 mg via ORAL
  Filled 2019-06-05: qty 1

## 2019-06-05 NOTE — ED Provider Notes (Signed)
[facility] DEPT Provider Note   CSN: [id number] Arrival date & time: 06/05/19  0133     History   Chief Complaint Chief Complaint  Patient presents with  . multiple complaints    HPI [name] is a 43 y.o. female with a hx of polysubstance abuse including alcoholism presents to the Emergency Department complaining of gradual, persistent, progressively worsening "withdrawal" from Suboxone onset 1 week ago.  Pt reports she completed a Suboxone taper last week and has had sweats, chills, nausea, anxiety, sadness and insomnia.  She denies suicidal or homicidal ideation.  She also reports associated vomiting multiple times per [facility].  She denies abdominal pain and chest pain.  She reports proximally 1 week of headache and bilateral neck pain, but no fever or vision changes.  Pt reports syncope vs seizure tonight stating she was sitting on the couch and awoke on the floor.  She does not remember the events and was alone at the time.  Pt reports she is 5 mos clean.  She denies known sick contacts.  No aggravating or alleviating factors.       The history is provided by the patient and medical records. No language interpreter was used.    Past Medical History:  Diagnosis Date  . Alcohol abuse   . Alcohol abuse   . Anemia   . Anxiety   . Arthritis    knees bilateral   DJD  stenosis   . Blood dyscrasia    HSV  . Cancer ([place])    vaginal  malignant carcinoma  . Chronic back pain   . Complication of anesthesia    panic attack, fight or flight  . Drug-seeking behavior   . Endometriosis   . Fibroids   . Fibromyalgia   . Foot fracture    bilateral and toes  . GERD (gastroesophageal reflux disease)    history of  . Headache(784.0)    history of  . Hypothyroidism   . Neuromuscular disorder (HCC)    neuropathy  . PCOS (polycystic ovarian syndrome)   . Polysubstance abuse ([place])   . PONV (postoperative nausea and vomiting)   . Ruptured lumbar disc   .  Seizures ([place])   . Sleep apnea    cpap  . Thyroid disease     Patient Active Problem List   Diagnosis Date Noted  . Major depressive disorder, recurrent episode, severe, with psychosis ([place]) 02/01/2018  . Major depressive disorder, recurrent, severe with psychotic features ([place]) 02/01/2018  . ETOH abuse 07/18/2014  . Alcoholic pancreatitis [id number]  . Drug-seeking behavior 07/18/2014  . Acute pancreatitis 07/16/2014  . Alcohol intoxication in active alcoholic without complication ([place])   . Depression   . Polysubstance dependence including opioid type drug, episodic abuse ([place]) 06/26/2014  . Substance induced mood disorder ([place]) 05/03/2014  . Cocaine abuse with intoxication with perceptual disturbance ([place]) 04/12/2014  . Alcohol dependence with uncomplicated withdrawal ([place]) 04/03/2014  . Alcohol dependence with withdrawal with complication ([place]) [id number]  . Herpes labialis 10/08/2013  . Lumbar radiculopathy, chronic 10/08/2013  . Nicotine addiction 07/30/2012  . Fall down stairs 05/27/2012  . Foot fracture 05/27/2012  . Obesity, Class III, BMI 40-49.9 (morbid obesity) ([place]) 10/31/2011  . Mood disorder ([place]) 10/31/2011  . Hypothyroidism 10/31/2011  . Obstructive sleep apnea 10/31/2011  . Narcotic abuse in remission ([place])   . Chronic back pain     Past Surgical History:  Procedure Laterality Date  . ADENOIDECTOMY    .  BACK SURGERY     6 times  . CESAREAN SECTION    . DILITATION & CURRETTAGE/HYSTROSCOPY WITH NOVASURE ABLATION N/A 04/28/2017   Procedure: DILATATION & CURETTAGE/HYSTEROSCOPY WITH NOVASURE ABLATION;  Surgeon: Brien Few, MD;  Location: [facility];  Service: Gynecology;  Laterality: N/A;  . TONSILLECTOMY    . TUBAL LIGATION       OB History    Gravida  1   Para  1   Term  1   Preterm      AB      Living        SAB      TAB      Ectopic      Multiple      Live Births               Home Medications    Prior to Admission  medications   Medication Sig Start Date End Date Taking? Authorizing Provider  buprenorphine-naloxone (SUBOXONE) 8-2 mg SUBL SL tablet Place 1 tablet under the tongue daily.    [provider]  clonazePAM (KLONOPIN) 2 MG tablet Take 2 mg by mouth 2 (two) times daily.    [provider]  clonazePAM (KLONOPIN) 2 MG tablet Take 2 mg by mouth 2 (two) times daily.    [provider]  cyclobenzaprine (FLEXERIL) 10 MG tablet Take 1 tablet (10 mg total) by mouth 2 (two) times daily as needed for muscle spasms. 03/14/19   Raylene Everts, MD  DULoxetine (CYMBALTA) 60 MG capsule Take 60 mg by mouth daily.    [provider]  DULoxetine (CYMBALTA) 60 MG capsule Take 1 capsule (60 mg total) by mouth 2 (two) times daily. 05/23/19   Mozingo, Berdie Ogren, NP  eszopiclone (LUNESTA) 2 MG TABS tablet Take 2 mg by mouth at bedtime as needed for sleep. Take immediately before bedtime    [provider]  gabapentin (NEURONTIN) 300 MG capsule Take 300 mg by mouth 4 (four) times daily.     [provider]  gabapentin (NEURONTIN) 400 MG capsule Take 1 capsule (400 mg total) by mouth 4 (four) times daily. 02/03/18   Mordecai Maes, NP  gabapentin (NEURONTIN) 600 MG tablet Take 1 tablet (600 mg total) by mouth 4 (four) times daily. 05/23/19   Mozingo, Berdie Ogren, NP  haloperidol (HALDOL) 2 MG tablet Take 1 tablet (2 mg total) by mouth at bedtime. 02/11/18   Ethelene Hal, NP  hydrOXYzine (ATARAX/VISTARIL) 25 MG tablet Take 1 tablet (25 mg total) by mouth every 6 (six) hours as needed (mild/moderate anxiety). 02/03/18   Mordecai Maes, NP  lamoTRIgine (LAMICTAL) 100 MG tablet Take 1 tablet (100 mg total) by mouth daily. 05/23/19   Mozingo, Berdie Ogren, NP  levothyroxine (SYNTHROID, LEVOTHROID) 75 MCG tablet Take 1 tablet (75 mcg total) by mouth daily before breakfast. 08/25/14   Leandrew Koyanagi, MD  omeprazole (PRILOSEC) 20 MG capsule Take 1  capsule (20 mg total) by mouth daily. 03/14/19   Raylene Everts, MD  ondansetron (ZOFRAN ODT) 4 MG disintegrating tablet 4mg  ODT q4 hours prn nausea/vomit 11/01/18   Deno Etienne, DO  ondansetron (ZOFRAN) 4 MG tablet Take 1 tablet (4 mg total) by mouth every 6 (six) hours. 03/14/19   Raylene Everts, MD  traMADol (ULTRAM) 50 MG tablet Take 1 tablet (50 mg total) by mouth every 6 (six) hours as needed. 02/23/19   Blanchie Dessert, MD  zolpidem (AMBIEN) 10 MG tablet Take 1 tablet (  10 mg total) by mouth at bedtime as needed for sleep. 10/04/12 11/03/12  Leandrew Koyanagi, MD  zolpidem (AMBIEN) 10 MG tablet Take 10 mg by mouth at bedtime as needed for sleep.    [provider]  zolpidem (AMBIEN) 10 MG tablet Take 10 mg by mouth at bedtime.    [provider]    Family History Family History  Problem Relation Age of Onset  . Arthritis Mother   . Clotting disorder Mother   . Diabetes Mother   . Hypertension Mother   . Hyperlipidemia Father   . Heart disease Father   . Prostate cancer Father   . Leukemia Maternal Grandmother   . Diabetes Maternal Grandfather   . Stroke Paternal Grandmother   . Heart disease Paternal Grandmother     Social History Social History   Tobacco Use  . Smoking status: Current Every [place] Smoker    Packs/[place]: 1.00    Years: 20.00    Pack years: 20.00    Types: Cigarettes  . Smokeless tobacco: Never Used  Substance Use Topics  . Alcohol use: No    Comment: denies alcohol since 12/16  . Drug use: Yes    Types: Cocaine, Amphetamines, Marijuana, Benzodiazepines    Comment: narcotic abuse recoverying addict     Allergies   Avelox [moxifloxacin], Quinolones, and Levaquin [levofloxacin]   Review of Systems Review of Systems  Constitutional: Negative for appetite change, diaphoresis, fatigue, fever and unexpected weight change.  HENT: Negative for mouth sores.   Eyes: Negative for visual disturbance.  Respiratory: Negative for cough,  chest tightness, shortness of breath and wheezing.   Cardiovascular: Negative for chest pain.  Gastrointestinal: Positive for nausea and vomiting. Negative for abdominal pain, constipation and diarrhea.  Endocrine: Negative for polydipsia, polyphagia and polyuria.  Genitourinary: Negative for dysuria, frequency, hematuria and urgency.  Musculoskeletal: Positive for neck pain. Negative for back pain and neck stiffness.  Skin: Negative for rash.  Allergic/Immunologic: Negative for immunocompromised state.  Neurological: Positive for seizures ( questionable), syncope (questionable) and headaches. Negative for light-headedness.  Hematological: Does not bruise/bleed easily.  Psychiatric/Behavioral: Negative for sleep disturbance. The patient is nervous/anxious.      Physical Exam Updated Vital Signs BP (!) 157/99 (BP Location: Right Arm)   Pulse 99   Temp 99.2 F (37.3 C) (Oral)   Resp 16   Ht 5\' 7"  (1.702 m)   Wt 108.9 kg   SpO2 95% Comment: Simultaneous filing. User may not have seen previous data.  BMI 37.59 kg/m   Physical Exam Vitals signs and nursing note reviewed.  Constitutional:      General: She is not in acute distress.    Appearance: She is well-developed. She is not diaphoretic.  HENT:     Head: Normocephalic and atraumatic.     Comments: No obvious oral trauma Eyes:     General: No scleral icterus.    Conjunctiva/sclera: Conjunctivae normal.     Pupils: Pupils are equal, round, and reactive to light.     Comments: No horizontal, vertical or rotational nystagmus  Neck:     Musculoskeletal: Normal range of motion and neck supple. Muscular tenderness present.     Comments: Full active and passive ROM No midline tenderness.  Mild paraspinal tenderness. No nuchal rigidity or meningeal signs Cardiovascular:     Rate and Rhythm: Normal rate and regular rhythm.  Pulmonary:     Effort: Pulmonary effort is normal. No respiratory distress.  Abdominal:  Palpations:  Abdomen is soft.     Tenderness: There is no abdominal tenderness. There is no guarding or rebound.  Musculoskeletal: Normal range of motion.  Lymphadenopathy:     Cervical: No cervical adenopathy.  Skin:    General: Skin is warm and dry.     Findings: No rash.  Neurological:     Mental Status: She is alert and oriented to person, place, and time.     Cranial Nerves: No cranial nerve deficit.     Motor: No abnormal muscle tone.     Coordination: Coordination normal.     Comments: Mental Status:  Alert, oriented, thought content appropriate. Speech fluent without evidence of aphasia. Able to follow 2 step commands without difficulty.  Cranial Nerves:  II:  Peripheral visual fields grossly normal, pupils equal, round, reactive to light III,IV, VI: ptosis not present, extra-ocular motions intact bilaterally  V,VII: smile symmetric, facial light touch sensation equal VIII: hearing grossly normal bilaterally  IX,X: midline uvula rise  XI: bilateral shoulder shrug equal and strong XII: midline tongue extension  Motor:  5/5 in upper and lower extremities bilaterally including strong and equal grip strength and dorsiflexion/plantar flexion Sensory: light touch normal in all extremities.  Cerebellar: normal finger-to-nose with bilateral upper extremities Gait: normal gait and balance CV: distal pulses palpable throughout   Psychiatric:        Behavior: Behavior normal.        Thought Content: Thought content normal.        Judgment: Judgment normal.      ED Treatments / Results  Labs (all labs ordered are listed, but only abnormal results are displayed) Labs Reviewed  SARS CORONAVIRUS 2 (TAT 6-24 HRS)    EKG EKG Interpretation  Date/Time:  Monday June 05 2019 01:49:20 EST Ventricular Rate:  88 PR Interval:    QRS Duration: 84 QT Interval:  357 QTC Calculation: 432 R Axis:   48 Text Interpretation: Sinus rhythm Confirmed by Randal Buba, April ([id number]) on 06/05/2019 2:24:59  AM   Procedures Procedures (including critical care time)  Medications Ordered in ED Medications  ondansetron (ZOFRAN-ODT) disintegrating tablet 4 mg (4 mg Oral Given 06/05/19 0221)     Initial Impression / Assessment and Plan / ED Course  I have reviewed the triage vital signs and the nursing notes.  Pertinent labs & imaging results that were available during my care of the patient were reviewed by me and considered in my medical decision making (see chart for details).         Tayana Meschke was evaluated in Emergency Department on 06/05/2019 for the symptoms described in the history of present illness. She was evaluated in the context of the global COVID-19 pandemic, which necessitated consideration that the patient might be at risk for infection with the SARS-CoV-2 virus that causes COVID-19. Institutional protocols and algorithms that pertain to the evaluation of patients at risk for COVID-19 are in a state of rapid change based on information released by regulatory bodies including the CDC and federal and state organizations. These policies and algorithms were followed during the patient's care in the ED.  Presents with multiple complaints including withdrawal from Suboxone.  She has not attempted to contact the physician prescribing this or her primary care physician.  She states she just wants some relief.  She complains of vomiting however has moist mucous membranes and is not vomiting here in the emergency department.  Patient reporting questionable seizure versus syncope tonight.  She is a normal  neurologic exam.  Given complaints of headache and neck pain over the last several days and now questionable syncope and or seizure I have recommended CT scan of her head and neck, basic lab work and monitoring.  Patient does not wish to have any of these things.  She is requesting Zofran and discharge.  Given symptoms of vomiting, headache and myalgias, patient will be tested for COVID-19.   Hypertension noted however patient is afebrile without tachycardia or hypoxia.  EKG reassuring.  No evidence of ischemia or arrhythmia.  No prolonged QT.  I have discussed the risk of leaving [place] including death or disability.  Patient may return anytime to the emergency department for further evaluation.  She does not wish to stay for any further evaluation or treatment.  Final Clinical Impressions(s) / ED Diagnoses   Final diagnoses:  Intractable headache, unspecified chronicity pattern, unspecified headache type  Non-intractable vomiting with nausea, unspecified vomiting type    ED Discharge Orders    None       Agapito Games 06/05/19 0228    Palumbo, April, MD 06/05/19 0300

## 2019-06-05 NOTE — ED Notes (Signed)
Pt requesting "zofran" before beginning triage.

## 2019-06-05 NOTE — ED Notes (Signed)
Pt stated she did not want to stay any longer and wants to go home to sleep. Pt verbalized discharge instructions and follow up care. Alert and ambulatory without assistance. Gait steady. No iv.

## 2019-06-05 NOTE — ED Notes (Signed)
Pt ambulated to and from bathroom without any assistance. No reports of dizziness or shortness of breath

## 2019-06-05 NOTE — ED Triage Notes (Signed)
Per ems: Pt coming from home c/o suboxone withdrawal. C/o of head and neck pain, seizures and LOC. Pt reports "she woke up on the floor and didn't know how she got there". Pt is alert and ambulatory without assistance on scene and in hospital. Today is the history of her fiance's passing via overdose. No SI or HI.

## 2019-06-05 NOTE — Telephone Encounter (Signed)
Needs standing lab order for twice weekly UDS.

## 2019-06-05 NOTE — Discharge Instructions (Addendum)
1. Medications: usual home medications 2. Treatment: rest, drink plenty of fluids,  3. Follow Up: Please followup with your primary doctor in 1-2 days for discussion of your diagnoses and further evaluation after today's visit; if you do not have a primary care doctor use the resource guide provided to find one; Please return to the ER for recurrent seizures, new or worsening symptoms.

## 2019-06-19 ENCOUNTER — Ambulatory Visit: Payer: Medicare HMO | Admitting: Adult Health

## 2019-08-08 ENCOUNTER — Other Ambulatory Visit: Payer: Self-pay | Admitting: Adult Health

## 2019-08-08 DIAGNOSIS — F411 Generalized anxiety disorder: Secondary | ICD-10-CM

## 2019-08-08 DIAGNOSIS — F39 Unspecified mood [affective] disorder: Secondary | ICD-10-CM

## 2019-08-08 DIAGNOSIS — F331 Major depressive disorder, recurrent, moderate: Secondary | ICD-10-CM

## 2019-08-15 ENCOUNTER — Other Ambulatory Visit: Payer: Self-pay

## 2019-08-15 DIAGNOSIS — F39 Unspecified mood [affective] disorder: Secondary | ICD-10-CM

## 2019-08-24 ENCOUNTER — Other Ambulatory Visit: Payer: Self-pay | Admitting: Adult Health

## 2019-08-24 DIAGNOSIS — F39 Unspecified mood [affective] disorder: Secondary | ICD-10-CM

## 2019-08-31 ENCOUNTER — Other Ambulatory Visit: Payer: Self-pay

## 2019-08-31 ENCOUNTER — Emergency Department (HOSPITAL_COMMUNITY)
Admission: EM | Admit: 2019-08-31 | Discharge: 2019-09-01 | Disposition: A | Discharge status: Other Acute Inpatient Hospital | Payer: Medicare HMO | Attending: Emergency Medicine | Admitting: Emergency Medicine

## 2019-08-31 ENCOUNTER — Encounter (HOSPITAL_COMMUNITY): Payer: Self-pay

## 2019-08-31 DIAGNOSIS — Z79899 Other long term (current) drug therapy: Secondary | ICD-10-CM | POA: Diagnosis not present

## 2019-08-31 DIAGNOSIS — R4182 Altered mental status, unspecified: Secondary | ICD-10-CM | POA: Diagnosis present

## 2019-08-31 DIAGNOSIS — Z20822 Contact with and (suspected) exposure to covid-19: Secondary | ICD-10-CM | POA: Diagnosis not present

## 2019-08-31 DIAGNOSIS — F1010 Alcohol abuse, uncomplicated: Secondary | ICD-10-CM | POA: Diagnosis not present

## 2019-08-31 DIAGNOSIS — F1910 Other psychoactive substance abuse, uncomplicated: Secondary | ICD-10-CM

## 2019-08-31 DIAGNOSIS — R451 Restlessness and agitation: Secondary | ICD-10-CM | POA: Diagnosis not present

## 2019-08-31 DIAGNOSIS — F1490 Cocaine use, unspecified, uncomplicated: Secondary | ICD-10-CM | POA: Diagnosis not present

## 2019-08-31 DIAGNOSIS — F17210 Nicotine dependence, cigarettes, uncomplicated: Secondary | ICD-10-CM | POA: Diagnosis not present

## 2019-08-31 DIAGNOSIS — R4689 Other symptoms and signs involving appearance and behavior: Secondary | ICD-10-CM

## 2019-08-31 DIAGNOSIS — F332 Major depressive disorder, recurrent severe without psychotic features: Secondary | ICD-10-CM | POA: Diagnosis not present

## 2019-08-31 LAB — COMPREHENSIVE METABOLIC PANEL
ALT: 20 U/L (ref 0–44)
AST: 34 U/L (ref 15–41)
Albumin: 3.9 g/dL (ref 3.5–5.0)
Alkaline Phosphatase: 51 U/L (ref 38–126)
Anion gap: 11 (ref 5–15)
BUN: 11 mg/dL (ref 6–20)
CO2: 24 mmol/L (ref 22–32)
Calcium: 8.8 mg/dL — ABNORMAL LOW (ref 8.9–10.3)
Chloride: 104 mmol/L (ref 98–111)
Creatinine, Ser: 1.09 mg/dL — ABNORMAL HIGH (ref 0.44–1.00)
GFR calc Af Amer: >60 mL/min (ref >60)
GFR calc non Af Amer: >60 mL/min (ref >60)
Glucose, Bld: 90 mg/dL (ref 70–99)
Potassium: 4.2 mmol/L (ref 3.5–5.1)
Sodium: 139 mmol/L (ref 135–145)
Total Bilirubin: 1.4 mg/dL — ABNORMAL HIGH (ref 0.3–1.2)
Total Protein: 6.7 g/dL (ref 6.5–8.1)

## 2019-08-31 LAB — CBC
HCT: 35.9 % — ABNORMAL LOW (ref 36.0–46.0)
Hemoglobin: 12.1 g/dL (ref 12.0–15.0)
MCH: 31.8 pg (ref 26.0–34.0)
MCHC: 33.7 g/dL (ref 30.0–36.0)
MCV: 94.5 fL (ref 80.0–100.0)
Platelets: 171 10*3/uL (ref 150–400)
RBC: 3.8 MIL/uL — ABNORMAL LOW (ref 3.87–5.11)
RDW: 13.2 % (ref 11.5–15.5)
WBC: 7 10*3/uL (ref 4.0–10.5)
nRBC: 0 % (ref 0.0–0.2)

## 2019-08-31 LAB — SALICYLATE LEVEL: Salicylate Lvl: <7 mg/dL — ABNORMAL LOW (ref 7.0–30.0)

## 2019-08-31 LAB — ETHANOL: Alcohol, Ethyl (B): <10 mg/dL (ref <10)

## 2019-08-31 LAB — I-STAT BETA HCG BLOOD, ED (MC, WL, AP ONLY): I-stat hCG, quantitative: <5 m[IU]/mL (ref <5)

## 2019-08-31 LAB — CBG MONITORING, ED: Glucose-Capillary: 98 mg/dL (ref 70–99)

## 2019-08-31 LAB — ACETAMINOPHEN LEVEL: Acetaminophen (Tylenol), Serum: <10 ug/mL — ABNORMAL LOW (ref 10–30)

## 2019-08-31 MED ORDER — AMMONIA AROMATIC IN INHA
RESPIRATORY_TRACT | Status: AC
  Filled 2019-08-31: qty 10

## 2019-08-31 MED ORDER — SODIUM CHLORIDE 0.9 % IV BOLUS
1000.0000 mL | Freq: Once | INTRAVENOUS | Status: AC
  Administered 2019-08-31: 17:00:00 1000 mL via INTRAVENOUS

## 2019-08-31 NOTE — Discharge Instructions (Signed)
Don't take too much of your meds   See your doctor   Return to ER if you have thoughts of harming yourself or others, hallucinations.

## 2019-08-31 NOTE — ED Notes (Addendum)
Belongings placed in bag in 16-18 cabinets .  1 bag- clothes, shoes, bracelet, purse, lighter, cigarettes.  2- pants

## 2019-08-31 NOTE — ED Triage Notes (Addendum)
Patient was being evicted from a building.   Patient proceeded to have a behavior episode.  Patient was having a"mental break down" Hx. Substance abuse  Patient unable to answer any of ems questions Patient states she took Klonopin and is prescribed Patient tried to jump out of ambulance Patient in soft restraints with ems   EMS gave: 5mg  versed IM 5mg  haldol IM   NO IV access at this time  CBG-150 BP-118/90 HR-90

## 2019-08-31 NOTE — ED Notes (Addendum)
Per EDP Pt can be discharge as long as a family member is able to come pick her up. Pt unable to remember phone numbers. Found pt phone in pocket book. Phone was dead finally able to locate charge in ED to charge phone to so pt can call family/

## 2019-08-31 NOTE — ED Notes (Addendum)
EDP stated can call daughter and arrange for ride for discharge. PT is unable to recall phone number and became upset and tearful. This writer attempted to locate number in chart.

## 2019-08-31 NOTE — ED Notes (Addendum)
Patient had a bottle of Clonazepam on her that was filled yesterday with 90 tablet (2mg ) to take 1 tablet by mouth 3x daily.   54 counted in bottle.

## 2019-08-31 NOTE — ED Notes (Addendum)
Spoke to [place] at  poison control.   Monitor for mild hypotension  Observation for at least 6 hours Repeat EKG before clearing patient in about 4 hours.   Mostly supportive care.  Fluids as needed.   Avoid romazicon unless trying to intubate.

## 2019-08-31 NOTE — ED Provider Notes (Signed)
[name] Provider Note   CSN: [id number] Arrival date & time: 08/31/19  1538     History Chief Complaint  Patient presents with  . Psychiatric Evaluation    [name] is a 44 y.o. female hx of alcohol abuse, anxiety, who presenting with.  Patient was admitted from her apartment today.  She was interviewed by police and apparently had a mental breakdown.  She was agitated and EMS brought her over.  She tried to jump out of the EMS and was given Versed and Haldol IM.  Patient apparently takes Klonopin and was prescribed Klonopin.  Patient is altered and unable to give much history at all.  No recorded suicidal homicidal ideations. Patient does have previous psych admissions.   The history is provided by the EMS personnel.  Level V caveat- AMS      Past Medical History:  Diagnosis Date  . Alcohol abuse   . Alcohol abuse   . Anemia   . Anxiety   . Arthritis    knees bilateral   DJD  stenosis   . Blood dyscrasia    HSV  . Cancer ([place])    vaginal  malignant carcinoma  . Chronic back pain   . Complication of anesthesia    panic attack, fight or flight  . Drug-seeking behavior   . Endometriosis   . Fibroids   . Fibromyalgia   . Foot fracture    bilateral and toes  . GERD (gastroesophageal reflux disease)    history of  . Headache(784.0)    history of  . Hypothyroidism   . Neuromuscular disorder (HCC)    neuropathy  . PCOS (polycystic ovarian syndrome)   . Polysubstance abuse ([place])   . PONV (postoperative nausea and vomiting)   . Ruptured lumbar disc   . Seizures ([place])   . Sleep apnea    cpap  . Thyroid disease     Patient Active Problem List   Diagnosis Date Noted  . Major depressive disorder, recurrent episode, severe, with psychosis ([place]) 02/01/2018  . Major depressive disorder, recurrent, severe with psychotic features ([place]) 02/01/2018  . ETOH abuse 07/18/2014  . Alcoholic pancreatitis [id number]  . Drug-seeking  behavior 07/18/2014  . Acute pancreatitis 07/16/2014  . Alcohol intoxication in active alcoholic without complication ([place])   . Depression   . Polysubstance dependence including opioid type drug, episodic abuse ([place]) 06/26/2014  . Substance induced mood disorder ([place]) 05/03/2014  . Cocaine abuse with intoxication with perceptual disturbance ([place]) 04/12/2014  . Alcohol dependence with uncomplicated withdrawal ([place]) 04/03/2014  . Alcohol dependence with withdrawal with complication ([place]) [id number]  . Herpes labialis 10/08/2013  . Lumbar radiculopathy, chronic 10/08/2013  . Nicotine addiction 07/30/2012  . Fall down stairs 05/27/2012  . Foot fracture 05/27/2012  . Obesity, Class III, BMI 40-49.9 (morbid obesity) ([place]) 10/31/2011  . Mood disorder ([place]) 10/31/2011  . Hypothyroidism 10/31/2011  . Obstructive sleep apnea 10/31/2011  . Narcotic abuse in remission ([place])   . Chronic back pain     Past Surgical History:  Procedure Laterality Date  . ADENOIDECTOMY    . BACK SURGERY     6 times  . CESAREAN SECTION    . DILITATION & CURRETTAGE/HYSTROSCOPY WITH NOVASURE ABLATION N/A 04/28/2017   Procedure: DILATATION & CURETTAGE/HYSTEROSCOPY WITH NOVASURE ABLATION;  Surgeon: Brien Few, MD;  Location: [facility];  Service: Gynecology;  Laterality: N/A;  . TONSILLECTOMY    . TUBAL LIGATION       OB  History    Gravida  1   Para  1   Term  1   Preterm      AB      Living        SAB      TAB      Ectopic      Multiple      Live Births              Family History  Problem Relation Age of Onset  . Arthritis Mother   . Clotting disorder Mother   . Diabetes Mother   . Hypertension Mother   . Hyperlipidemia Father   . Heart disease Father   . Prostate cancer Father   . Leukemia Maternal Grandmother   . Diabetes Maternal Grandfather   . Stroke Paternal Grandmother   . Heart disease Paternal Grandmother     Social History   Tobacco Use  . Smoking status:  Current Every [employer] Smoker    Packs/Wender: 1.00    Years: 20.00    Pack years: 20.00    Types: Cigarettes  . Smokeless tobacco: Never Used  Substance Use Topics  . Alcohol use: No    Comment: denies alcohol since 12/16  . Drug use: Yes    Types: Cocaine, Amphetamines, Marijuana, Benzodiazepines    Comment: narcotic abuse recoverying addict    Home Medications Prior to Admission medications   Medication Sig Start Date End Date Taking? Authorizing Provider  buprenorphine-naloxone (SUBOXONE) 8-2 mg SUBL SL tablet Place 1 tablet under the tongue daily.    [provider]  clonazePAM (KLONOPIN) 2 MG tablet Take 2 mg by mouth 2 (two) times daily.    [provider]  clonazePAM (KLONOPIN) 2 MG tablet Take 2 mg by mouth 2 (two) times daily.    [provider]  cyclobenzaprine (FLEXERIL) 10 MG tablet Take 1 tablet (10 mg total) by mouth 2 (two) times daily as needed for muscle spasms. 03/14/19   Raylene Everts, MD  DULoxetine (CYMBALTA) 60 MG capsule Take 60 mg by mouth daily.    [provider]  DULoxetine (CYMBALTA) 60 MG capsule Take 1 capsule (60 mg total) by mouth 2 (two) times daily. 05/23/19   Mozingo, Berdie Ogren, NP  eszopiclone (LUNESTA) 2 MG TABS tablet Take 2 mg by mouth at bedtime as needed for sleep. Take immediately before bedtime    [provider]  gabapentin (NEURONTIN) 300 MG capsule Take 300 mg by mouth 4 (four) times daily.     [provider]  gabapentin (NEURONTIN) 400 MG capsule Take 1 capsule (400 mg total) by mouth 4 (four) times daily. 02/03/18   Mordecai Maes, NP  gabapentin (NEURONTIN) 600 MG tablet Take 1 tablet (600 mg total) by mouth 4 (four) times daily. 05/23/19   Mozingo, Berdie Ogren, NP  haloperidol (HALDOL) 2 MG tablet Take 1 tablet (2 mg total) by mouth at bedtime. 02/11/18   Ethelene Hal, NP  hydrOXYzine (ATARAX/VISTARIL) 25 MG tablet Take 1 tablet (25 mg total) by mouth every 6 (six)  hours as needed (mild/moderate anxiety). 02/03/18   Mordecai Maes, NP  lamoTRIgine (LAMICTAL) 100 MG tablet TAKE 1 TABLET BY MOUTH EVERY [place] 08/24/19   Mozingo, Berdie Ogren, NP  levothyroxine (SYNTHROID, LEVOTHROID) 75 MCG tablet Take 1 tablet (75 mcg total) by mouth daily before breakfast. 08/25/14   Leandrew Koyanagi, MD  omeprazole (PRILOSEC) 20 MG capsule Take 1 capsule (20 mg total) by mouth daily. 03/14/19  Raylene Everts, MD  ondansetron [facility] ODT) 4 MG disintegrating tablet 4mg  ODT q4 hours prn nausea/vomit 11/01/18   Deno Etienne, DO  ondansetron (ZOFRAN) 4 MG tablet Take 1 tablet (4 mg total) by mouth every 6 (six) hours. 03/14/19   Raylene Everts, MD  traMADol (ULTRAM) 50 MG tablet Take 1 tablet (50 mg total) by mouth every 6 (six) hours as needed. 02/23/19   Blanchie Dessert, MD  zolpidem (AMBIEN) 10 MG tablet Take 1 tablet (10 mg total) by mouth at bedtime as needed for sleep. 10/04/12 11/03/12  Leandrew Koyanagi, MD  zolpidem (AMBIEN) 10 MG tablet Take 10 mg by mouth at bedtime as needed for sleep.    [provider]  zolpidem (AMBIEN) 10 MG tablet Take 10 mg by mouth at bedtime.    [provider]    Allergies    Avelox [moxifloxacin], Quinolones, and Levaquin [levofloxacin]  Review of Systems   Review of Systems  Psychiatric/Behavioral: Positive for agitation.  All other systems reviewed and are negative.   Physical Exam Updated Vital Signs There were no vitals taken for this visit.  Physical Exam Vitals and nursing note reviewed.  Constitutional:      Comments: Tired, sleepy   HENT:     Head: Normocephalic.     Nose: Nose normal.     Mouth/Throat:     Mouth: Mucous membranes are moist.  Eyes:     Comments: Pinpoint pupils   Cardiovascular:     Rate and Rhythm: Normal rate and regular rhythm.  Pulmonary:     Effort: Pulmonary effort is normal.     Breath sounds: Normal breath sounds.  Abdominal:     General: Abdomen is flat.      Palpations: Abdomen is soft.  Musculoskeletal:        General: Normal range of motion.     Cervical back: Normal range of motion and neck supple.  Skin:    General: Skin is warm.     Capillary Refill: Capillary refill takes less than 2 seconds.  Neurological:     General: No focal deficit present.     Comments: Sleepy   Psychiatric:     Comments: Unable      ED Results / Procedures / Treatments   Labs (all labs ordered are listed, but only abnormal results are displayed) Labs Reviewed  COMPREHENSIVE METABOLIC PANEL  ETHANOL  SALICYLATE LEVEL  ACETAMINOPHEN LEVEL  CBC  RAPID URINE DRUG SCREEN, HOSP PERFORMED  CBG MONITORING, ED  CBG MONITORING, ED  I-STAT BETA HCG BLOOD, ED ([place], [facility], [facility] ONLY)    EKG EKG Interpretation  Date/Time:  Thursday August 31 2019 15:52:08 EST Ventricular Rate:  88 PR Interval:    QRS Duration: 85 QT Interval:  371 QTC Calculation: 449 R Axis:   27 Text Interpretation: Sinus rhythm Low voltage, precordial leads No significant change since last tracing Confirmed by Wandra Arthurs [phone]) on 08/31/2019 4:05:18 PM   Radiology No results found.  Procedures Procedures (including critical care time)  Angiocath insertion Performed by: Wandra Arthurs  Consent: Verbal consent obtained. Risks and benefits: risks, benefits and alternatives were discussed Time out: Immediately prior to procedure a "time out" was called to verify the correct patient, procedure, equipment, support staff and site/side marked as required.  Preparation: Patient was prepped and draped in the usual sterile fashion.  Vein Location: L brachial  Ultrasound Guided  Gauge: 20 long   Normal blood return and flush  without difficulty Patient tolerance: Patient tolerated the procedure well with no immediate complications.    Medications Ordered in ED Medications - No data to display  ED Course  I have reviewed the triage vital signs and the nursing notes.  Pertinent  labs & imaging results that were available during my care of the patient were reviewed by me and considered in my medical decision making (see chart for details).    MDM Rules/Calculators/A&P                      [name] is a 44 y.o. female here presenting with altered mental status.  She found that she was getting evicted from her apartment and started became agitated.  Patient was given Haldol and Versed IM prior to arrival.  Patient is sleepy.  She may have taken about 30 Klonopin pills but unclear what timeline it was.  Will get medical clearance labs.   9:58 PM Poison control recommended observation for 6 hours, which has passed now.  Patient is awake and alert.  Patient adamantly denies any suicidal homicidal ideations.  Patient states that she just got upset when she found out she got addicted.  Told her that I prefer that she stays in the psychiatry but she wants to go home.  Since she is not endorse any suicidal homicidal ideation, she can be discharged. Told her not to take too much of her meds   Final Clinical Impression(s) / ED Diagnoses Final diagnoses:  None    Rx / DC Orders ED Discharge Orders    None       Drenda Freeze, MD 08/31/19 2159

## 2019-08-31 NOTE — ED Notes (Signed)
NEEDS TO BE SEEN. Jolayne Haines, RN, attempted unable to arouse patient. TTS to attempt at later time.

## 2019-08-31 NOTE — ED Notes (Addendum)
Soft restraints untied. Patient does not need restraints at this time. Patient sleeping and calm. Skin integrity around wrist WDL.   Patient has several bandaids going up left arm, scars, and bruises.

## 2019-09-01 ENCOUNTER — Inpatient Hospital Stay (HOSPITAL_COMMUNITY)
Admission: AD | Admit: 2019-09-01 | Discharge: 2019-09-03 | DRG: 881 | Disposition: A | Discharge status: Home / Self Care | Payer: Medicare HMO | Attending: Psychiatry | Admitting: Psychiatry

## 2019-09-01 ENCOUNTER — Encounter (HOSPITAL_COMMUNITY): Payer: Self-pay | Admitting: Psychiatry

## 2019-09-01 ENCOUNTER — Other Ambulatory Visit: Payer: Self-pay

## 2019-09-01 DIAGNOSIS — T424X4A Poisoning by benzodiazepines, undetermined, initial encounter: Secondary | ICD-10-CM | POA: Diagnosis present

## 2019-09-01 DIAGNOSIS — F17210 Nicotine dependence, cigarettes, uncomplicated: Secondary | ICD-10-CM | POA: Diagnosis present

## 2019-09-01 DIAGNOSIS — F329 Major depressive disorder, single episode, unspecified: Principal | ICD-10-CM | POA: Diagnosis present

## 2019-09-01 DIAGNOSIS — Z20822 Contact with and (suspected) exposure to covid-19: Secondary | ICD-10-CM | POA: Diagnosis present

## 2019-09-01 DIAGNOSIS — F332 Major depressive disorder, recurrent severe without psychotic features: Secondary | ICD-10-CM | POA: Diagnosis not present

## 2019-09-01 DIAGNOSIS — F1120 Opioid dependence, uncomplicated: Secondary | ICD-10-CM | POA: Diagnosis present

## 2019-09-01 DIAGNOSIS — G4700 Insomnia, unspecified: Secondary | ICD-10-CM | POA: Diagnosis present

## 2019-09-01 DIAGNOSIS — F39 Unspecified mood [affective] disorder: Secondary | ICD-10-CM

## 2019-09-01 LAB — RAPID URINE DRUG SCREEN, HOSP PERFORMED
Amphetamines: NOT DETECTED
Barbiturates: NOT DETECTED
Benzodiazepines: POSITIVE — AB
Cocaine: POSITIVE — AB
Opiates: NOT DETECTED
Tetrahydrocannabinol: NOT DETECTED

## 2019-09-01 LAB — POC SARS CORONAVIRUS 2 AG -  ED: SARS Coronavirus 2 Ag: NEGATIVE

## 2019-09-01 MED ORDER — THIAMINE HCL 100 MG PO TABS
100.0000 mg | ORAL_TABLET | Freq: Every day | ORAL | Status: DC
  Administered 2019-09-02 – 2019-09-03 (×2): 100 mg via ORAL
  Filled 2019-09-01 (×4): qty 1

## 2019-09-01 MED ORDER — LEVOTHYROXINE SODIUM 75 MCG PO TABS
75.0000 ug | ORAL_TABLET | Freq: Every day | ORAL | Status: DC
  Administered 2019-09-02 – 2019-09-03 (×2): 75 ug via ORAL
  Filled 2019-09-01 (×4): qty 1

## 2019-09-01 MED ORDER — LORAZEPAM 1 MG PO TABS
1.0000 mg | ORAL_TABLET | Freq: Three times a day (TID) | ORAL | Status: DC
  Filled 2019-09-01: qty 1

## 2019-09-01 MED ORDER — LORAZEPAM 1 MG PO TABS: 1.0000 mg | ORAL_TABLET | Freq: Every day | ORAL | Status: DC

## 2019-09-01 MED ORDER — TRAZODONE HCL 50 MG PO TABS
50.0000 mg | ORAL_TABLET | Freq: Every evening | ORAL | Status: DC | PRN
  Administered 2019-09-01: 50 mg via ORAL
  Filled 2019-09-01 (×2): qty 1

## 2019-09-01 MED ORDER — LORAZEPAM 1 MG PO TABS
1.0000 mg | ORAL_TABLET | Freq: Four times a day (QID) | ORAL | Status: DC | PRN
  Administered 2019-09-02 – 2019-09-03 (×2): 1 mg via ORAL
  Filled 2019-09-01 (×2): qty 1

## 2019-09-01 MED ORDER — MAGNESIUM HYDROXIDE 400 MG/5ML PO SUSP: 30.0000 mL | Freq: Every day | ORAL | Status: DC | PRN

## 2019-09-01 MED ORDER — LORAZEPAM 1 MG PO TABS: 1.0000 mg | ORAL_TABLET | Freq: Two times a day (BID) | ORAL | Status: DC

## 2019-09-01 MED ORDER — LORAZEPAM 2 MG/ML IJ SOLN: 2.0000 mg | Freq: Once | INTRAMUSCULAR | Status: DC

## 2019-09-01 MED ORDER — HYDROXYZINE HCL 25 MG PO TABS
25.0000 mg | ORAL_TABLET | Freq: Three times a day (TID) | ORAL | Status: DC | PRN
  Administered 2019-09-02: 25 mg via ORAL
  Filled 2019-09-01: qty 1

## 2019-09-01 MED ORDER — HYDROXYZINE HCL 25 MG PO TABS
25.0000 mg | ORAL_TABLET | Freq: Four times a day (QID) | ORAL | Status: DC | PRN
  Administered 2019-09-03: 25 mg via ORAL
  Filled 2019-09-01: qty 1

## 2019-09-01 MED ORDER — ONDANSETRON 4 MG PO TBDP
4.0000 mg | ORAL_TABLET | Freq: Four times a day (QID) | ORAL | Status: DC | PRN
  Administered 2019-09-01 – 2019-09-02 (×2): 4 mg via ORAL
  Filled 2019-09-01 (×2): qty 1

## 2019-09-01 MED ORDER — ALUM & MAG HYDROXIDE-SIMETH 200-200-20 MG/5ML PO SUSP: 30.0000 mL | ORAL | Status: DC | PRN

## 2019-09-01 MED ORDER — ADULT MULTIVITAMIN W/MINERALS CH
1.0000 | ORAL_TABLET | Freq: Every day | ORAL | Status: DC
  Administered 2019-09-02 – 2019-09-03 (×2): 1 via ORAL
  Filled 2019-09-01 (×4): qty 1

## 2019-09-01 MED ORDER — THIAMINE HCL 100 MG/ML IJ SOLN: 100.0000 mg | Freq: Once | INTRAMUSCULAR | Status: DC

## 2019-09-01 MED ORDER — NICOTINE 21 MG/24HR TD PT24
21.0000 mg | MEDICATED_PATCH | Freq: Every day | TRANSDERMAL | Status: DC
  Administered 2019-09-01: 10:00:00 21 mg via TRANSDERMAL
  Filled 2019-09-01: qty 1

## 2019-09-01 MED ORDER — LOPERAMIDE HCL 2 MG PO CAPS: 2.0000 mg | ORAL_CAPSULE | ORAL | Status: DC | PRN

## 2019-09-01 MED ORDER — NICOTINE 21 MG/24HR TD PT24: 21.0000 mg | MEDICATED_PATCH | Freq: Every day | TRANSDERMAL | Status: DC

## 2019-09-01 MED ORDER — ACETAMINOPHEN 325 MG PO TABS: 650.0000 mg | ORAL_TABLET | Freq: Four times a day (QID) | ORAL | Status: DC | PRN

## 2019-09-01 MED ORDER — LORAZEPAM 1 MG PO TABS
1.0000 mg | ORAL_TABLET | Freq: Four times a day (QID) | ORAL | Status: DC
  Administered 2019-09-01 – 2019-09-02 (×2): 1 mg via ORAL
  Filled 2019-09-01: qty 1

## 2019-09-01 MED ORDER — LORAZEPAM 2 MG/ML IJ SOLN
2.0000 mg | Freq: Once | INTRAMUSCULAR | Status: AC
  Administered 2019-09-01: 16:00:00 2 mg via INTRAMUSCULAR
  Filled 2019-09-01: qty 1

## 2019-09-01 NOTE — ED Notes (Signed)
Phone returned to pt pocket book in  belongings bag and all belongings placed back in cabinet about nursing station.

## 2019-09-01 NOTE — ED Notes (Signed)
Pt awake and stating "where am I. Oh no did this happen again where are my parents? This can't be happening again, I want to go home" Pt redirected and explained she was brought in by EMS and took to much of prescribed medication. Pt demanding a nicotine path. Pt ambulatory to restroom without assistance.

## 2019-09-01 NOTE — ED Notes (Signed)
Pt called out asking what happened and how she got her. Pt was stated she did not recall events leading up to her being brought to the ED. PT became upset demanding to speak to her family on the phone and leave. MD made aware.

## 2019-09-01 NOTE — ED Notes (Signed)
Pt was calm and redirectable but tearful during communication and ambulating to restroom.

## 2019-09-01 NOTE — ED Notes (Signed)
Pt sleeping even and equal breathing pattern. Vital signs WDL

## 2019-09-01 NOTE — ED Notes (Signed)
MD Darl Householder, at bedside.
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
## 2019-09-01 NOTE — BH Assessment (Signed)
[facility] Assessment Progress Note   Per Hall Busing NP patient meets inpatient criteria as IVC is to be initiated.

## 2019-09-01 NOTE — ED Notes (Signed)
Pt now asleep

## 2019-09-01 NOTE — ED Notes (Signed)
Report called to [facility] and [employer] called for transport °

## 2019-09-01 NOTE — ED Provider Notes (Signed)
Nursing has called me to advise that the patient has been accepted to behavioral health however she requires a Covid swab.  Nurse reports that the patient has become increasingly agitated and pacing around reporting that she has to have her Suboxone.  Notably, the patient has been on fairly high doses of Klonopin and may be experiencing some exacerbation of agitation from not having had Klonopin for about 24 hours.  Will administer a Ativan dose 2 mg IM and reassess.  Will order rapid Covid testing.   Charlesetta Shanks, MD 09/04/19 1620

## 2019-09-01 NOTE — BH Assessment (Signed)
[facility] Assessment Progress Note  Per Hampton Abbot, MD, this pt requires psychiatric hospitalization.  Jasmine has assigned pt to [facility] Rm 302-2; [facility] will be ready to receive pt at 16:00.  Dr Dwyane Dee also finds that pt meets criteria for IVC, which she has initiated.  IVC documents have been faxed to [facility], and at [employer] confirms receipt.  He has since faxed Findings and Custody Order to this Probation officer.  At 11:54 I called [employer] and spoke to [employer], who took demographic information, agreeing to dispatch law enforcement to fill out Return of Service.  Law enforcement then presented at [facility], completing Return of Service, after which IVC documents were faxed to [facility].  Pt's nurse has been notified, and agrees to call report to [phone].  Pt is to be transported via [employer].   [name], [place] Coordinator [phone]

## 2019-09-01 NOTE — BH Assessment (Signed)
[place] Assessment Progress Note   Clinician was reviewing notes and saw that patient was up for discharge.  Pt has not yet been seen by TTS due to her not being awake & alert enough to be seen.  Clinician noted two risk factors that mitigate against patient being discharged w/o TTS consult.  One, patient attempted to jump from ambulance in transit.  Secondly, patient had a bottle of clonazepam that was filled yesterday with 90 tablets (2mg ) to take 1 tab by mouth 3x/D and there were only 54 remaining in the bottle.  Clinician contacted charge RN Mortimer Fries and he confirmed that patient was still asleep and too somnolent to be assessed at this time.  Clinician talked with Dr. Florina Ou and pointed out the risk factors for patient and he said patient should stay overnight until they can be seen by psychiatry.  TTS to check on patient later.

## 2019-09-01 NOTE — ED Notes (Signed)
Patient transported to [facility] by safe transport with belongings that were given to sitter.  Paitent was cooperative. Marland Kitchen

## 2019-09-01 NOTE — ED Notes (Signed)
Pt sleeping. Vitals signs stable. Pt has even equal breathing pattern.

## 2019-09-01 NOTE — BH Assessment (Signed)
[place] Assessment Progress Note   Patient is still sound asleep at this time.  TTS to assess when patient is alert and oriented.

## 2019-09-02 DIAGNOSIS — T424X4A Poisoning by benzodiazepines, undetermined, initial encounter: Secondary | ICD-10-CM

## 2019-09-02 MED ORDER — BUPRENORPHINE HCL-NALOXONE HCL 8-2 MG SL SUBL
1.0000 | SUBLINGUAL_TABLET | Freq: Two times a day (BID) | SUBLINGUAL | Status: DC
  Administered 2019-09-02 – 2019-09-03 (×3): 1 via SUBLINGUAL
  Filled 2019-09-02 (×3): qty 1

## 2019-09-02 MED ORDER — DULOXETINE HCL 60 MG PO CPEP
60.0000 mg | ORAL_CAPSULE | Freq: Every day | ORAL | Status: DC
  Administered 2019-09-03: 60 mg via ORAL
  Filled 2019-09-02 (×3): qty 1

## 2019-09-02 MED ORDER — QUETIAPINE FUMARATE 100 MG PO TABS
100.0000 mg | ORAL_TABLET | Freq: Every day | ORAL | Status: DC
  Administered 2019-09-02: 100 mg via ORAL
  Filled 2019-09-02 (×4): qty 1

## 2019-09-02 MED ORDER — ONDANSETRON 4 MG PO TBDP
4.0000 mg | ORAL_TABLET | Freq: Four times a day (QID) | ORAL | Status: DC | PRN
  Administered 2019-09-02 – 2019-09-03 (×2): 4 mg via ORAL
  Filled 2019-09-02 (×2): qty 1

## 2019-09-02 MED ORDER — GABAPENTIN 300 MG PO CAPS
300.0000 mg | ORAL_CAPSULE | Freq: Three times a day (TID) | ORAL | Status: DC
  Administered 2019-09-02 – 2019-09-03 (×3): 300 mg via ORAL
  Filled 2019-09-02 (×9): qty 1

## 2019-09-02 MED ORDER — LORAZEPAM 1 MG PO TABS
1.0000 mg | ORAL_TABLET | Freq: Once | ORAL | Status: AC
  Administered 2019-09-02: 1 mg via ORAL
  Filled 2019-09-02: qty 1

## 2019-09-02 MED ORDER — NICOTINE POLACRILEX 2 MG MT GUM
2.0000 mg | CHEWING_GUM | OROMUCOSAL | Status: DC | PRN
  Administered 2019-09-02 (×3): 2 mg via ORAL
  Filled 2019-09-02: qty 10
  Filled 2019-09-02 (×2): qty 1

## 2019-09-02 MED ORDER — LAMOTRIGINE 100 MG PO TABS
100.0000 mg | ORAL_TABLET | Freq: Every day | ORAL | Status: DC
  Administered 2019-09-02 – 2019-09-03 (×2): 100 mg via ORAL
  Filled 2019-09-02 (×4): qty 1

## 2019-09-02 NOTE — [facility] Group Notes (Signed)
Adult Psychoeducational Group Note  Date:  09/02/2019 Time:  12:22 PM  Group Topic/Focus:  Goals Group:   The focus of this group is to help patients establish daily goals to achieve during treatment and discuss how the patient can incorporate goal setting into their daily lives to aide in recovery.  Participation Level:  Did Not Attend   Paulino Rily 09/02/2019, 12:22 PM

## 2019-09-02 NOTE — H&P (Signed)
Psychiatric Admission Assessment Adult  Patient Identification: [name] MRN:  [id number] Date of Evaluation:  09/02/2019 Chief Complaint:  " I don't know what happened that I ended up in the ED" Principal Diagnosis: S/P BZD Overdose  Diagnosis:  Active Problems:   MDD (major depressive disorder)  History of Present Illness: 44 year old female, presented to ED  via EMS on 2/4 . Reportedly patient was agitated and attempted to jump out of EMS vehicle in route, requiring Versed/Haldol. On admission was poor/limited historian. Chart notes indicate patient had a bottle of #90 2 mgr Klonopin which had been filled the [place] prior and 54 tablets were counted . Patient reports she does not remember events leading to admission . States " I really don't know what happened and how I got here, all I remember was sitting in my house ". States " I basically just remember waking up in the Emergency Room". She states she had been taking Klonopin 2-3  times per week and was not taking daily, and denies abusing or taking more than prescribed . States " I can't believe I took all those Klonopins, I don't abuse it".  She reports that prior to above she had been " actually doing well, was happy", and denies having been depressed or sad leading up to admission. Denies any suicidal or self injurious ideations recently. At this time denies suicidal ideations and presents future oriented, states she is worried because she does not know where her car or car keys are , and states " I need to know where my car is because that is the way I get around". Denies neuro-vegetative symptoms leading up to admission. Admission UDS positive for BZD and Cocaine  Associated Signs/Symptoms: Depression Symptoms:  Reports history of insomnia, denies changes in appetite or energy level, denies anhedonia or sadness, denies feeling depressed before admission, denies having had any suicidal ideations (Hypo) Manic Symptoms: None noted or endorsed   Anxiety Symptoms:  Reports history of anxiety, states " I get panic attacks sometimes" Psychotic Symptoms: denies  PTSD Symptoms: Reports history of PTSD related to childhood trauma, does not elaborate. Currently denies significant or active PTSD symptoms  Total Time spent with patient: 45 minutes  Past Psychiatric History: reports she has history of opiate use disorder, now on maintenance agonist therapy with Suboxone. She reports she has been diagnosed with Bipolar Disorder in the past , and describes history of panic disorder, but denies history of agoraphobia. Denies history of psychosis. Endorses history of PTSD as above.  Denies history of violence .  She has a history of prior psychiatric admissions and had been admitted to [facility] on 7/19 for depression, passive SI, and drug abuse . At the time also reported being on Suboxone management , which was verified and continued during her stay   Is the patient at risk to self? Yes.    Has the patient been a risk to self in the past 6 months? No.  Has the patient been a risk to self within the distant past? No.  Is the patient a risk to others? No.  Has the patient been a risk to others in the past 6 months? No.  Has the patient been a risk to others within the distant past? No.   Prior Inpatient Therapy:  as above  Prior Outpatient Therapy:  She follows up at [facility], is prescribed Suboxone   Alcohol Screening: 1. How often do you have a drink containing alcohol?: Never 2. How  many drinks containing alcohol do you have on a typical John when you are drinking?: 1 or 2 3. How often do you have six or more drinks on one occasion?: Never AUDIT-C Score: 0 4. How often during the last year have you found that you were not able to stop drinking once you had started?: Never 5. How often during the last year have you failed to do what was normally expected from you becasue of drinking?: Never 6. How often during the last year have you needed  a first drink in the morning to get yourself going after a heavy drinking session?: Never 7. How often during the last year have you had a feeling of guilt of remorse after drinking?: Never 8. How often during the last year have you been unable to remember what happened the night before because you had been drinking?: Never 9. Have you or someone else been injured as a result of your drinking?: No 10. Has a relative or friend or a doctor or another health worker been concerned about your drinking or suggested you cut down?: No Alcohol Use Disorder Identification Test Final Score (AUDIT): 0 Alcohol Brief Interventions/Follow-up: AUDIT Score <7 follow-up not indicated Substance Abuse History in the last 12 months: denies history of alcohol use disorder, reports history of opiate use disorder, states currently on Suboxone maintenance, managed by Dr. Alcide Clever in [place]. Consequences of Substance Abuse: Denies prior history of seizures, denies history of blackouts  Previous Psychotropic Medications: reports she has been taking Suboxone at 8 mgrs SL TID, Neurontin at 600 mgrs QID, Lamictal 100 mgrs QHS, Cymbalta 60 mgrs BID, Ambien 10 mgrs QHS, Klonopin 2 mgrs QDAY PRN Psychological Evaluations:  No  Past Medical History: hypothyroidism, history of back surgeries . Of note, patient denies history of seizures . Takes Synthroid 0.75 micrograms daily Past Medical History:  Diagnosis Date  . Alcohol abuse   . Alcohol abuse   . Anemia   . Anxiety   . Arthritis    knees bilateral   DJD  stenosis   . Blood dyscrasia    HSV  . Cancer ([place])    vaginal  malignant carcinoma  . Chronic back pain   . Complication of anesthesia    panic attack, fight or flight  . Drug-seeking behavior   . Endometriosis   . Fibroids   . Fibromyalgia   . Foot fracture    bilateral and toes  . GERD (gastroesophageal reflux disease)    history of  . Headache(784.0)    history of  . Hypothyroidism   .  Neuromuscular disorder (HCC)    neuropathy  . PCOS (polycystic ovarian syndrome)   . Polysubstance abuse ([place])   . PONV (postoperative nausea and vomiting)   . Ruptured lumbar disc   . Seizures ([place])   . Sleep apnea    cpap  . Thyroid disease     Past Surgical History:  Procedure Laterality Date  . ADENOIDECTOMY    . BACK SURGERY     6 times  . CESAREAN SECTION    . DILITATION & CURRETTAGE/HYSTROSCOPY WITH NOVASURE ABLATION N/A 04/28/2017   Procedure: DILATATION & CURETTAGE/HYSTEROSCOPY WITH NOVASURE ABLATION;  Surgeon: Brien Few, MD;  Location: [facility];  Service: Gynecology;  Laterality: N/A;  . TONSILLECTOMY    . TUBAL LIGATION     Family History: parents alive, live together, has one brother  Family History  Problem Relation Age of Onset  . Arthritis Mother   .  Clotting disorder Mother   . Diabetes Mother   . Hypertension Mother   . Hyperlipidemia Father   . Heart disease Father   . Prostate cancer Father   . Leukemia Maternal Grandmother   . Diabetes Maternal Grandfather   . Stroke Paternal Grandmother   . Heart disease Paternal Grandmother    Family Psychiatric  History: reports history of bipolar disorder on maternal extended family, no known suicides in family. Maternal aunt alcoholic . Tobacco Screening: smokes 1 PPD  Social History: 65, lives with 55 y old daughter, who is currently with patient's daughter, on disability Social History   Substance and Sexual Activity  Alcohol Use No   Comment: denies alcohol since 12/16     Social History   Substance and Sexual Activity  Drug Use Yes  . Types: Cocaine, Amphetamines, Marijuana, Benzodiazepines   Comment: narcotic abuse recoverying addict    Additional Social History:  Allergies:   Allergies  Allergen Reactions  . Avelox [Moxifloxacin] Hives  . Quinolones Hives    Hives with moxifloxacin and levofloxacin  . Levaquin [Levofloxacin] Hives   Lab Results:  Results for orders placed or performed  during the hospital encounter of 08/31/19 (from the past 48 hour(s))  CBG monitoring, ED     Status: None   Collection Time: 08/31/19  3:57 PM  Result Value Ref Range   Glucose-Capillary 98 70 - 99 mg/dL  Comprehensive metabolic panel     Status: Abnormal   Collection Time: 08/31/19  4:05 PM  Result Value Ref Range   Sodium 139 135 - 145 mmol/L   Potassium 4.2 3.5 - 5.1 mmol/L   Chloride 104 98 - 111 mmol/L   CO2 24 22 - 32 mmol/L   Glucose, Bld 90 70 - 99 mg/dL   BUN 11 6 - 20 mg/dL   Creatinine, Ser 1.09 (H) 0.44 - 1.00 mg/dL   Calcium 8.8 (L) 8.9 - 10.3 mg/dL   Total Protein 6.7 6.5 - 8.1 g/dL   Albumin 3.9 3.5 - 5.0 g/dL   AST 34 15 - 41 U/L   ALT 20 0 - 44 U/L   Alkaline Phosphatase 51 38 - 126 U/L   Total Bilirubin 1.4 (H) 0.3 - 1.2 mg/dL   GFR calc non Af Amer >60 >60 mL/min   GFR calc Af Amer >60 >60 mL/min   Anion gap 11 5 - 15    Comment: Performed at [facility], [place] [address] [place], [place] [address]  Ethanol     Status: None   Collection Time: 08/31/19  4:05 PM  Result Value Ref Range   Alcohol, Ethyl (B) <10 <10 mg/dL    Comment: (NOTE) Lowest detectable limit for serum alcohol is 10 mg/dL. For medical purposes only. Performed at [facility], [place] [address] [place], [place] [address]   Salicylate level     Status: Abnormal   Collection Time: 08/31/19  4:05 PM  Result Value Ref Range   Salicylate Lvl <6.7 (L) 7.0 - 30.0 mg/dL    Comment: Performed at [facility], [place] [address] [place],  [address]  Acetaminophen level     Status: Abnormal   Collection Time: 08/31/19  4:05 PM  Result Value Ref Range   Acetaminophen (Tylenol), Serum <10 (L) 10 - 30 ug/mL    Comment: (NOTE) Therapeutic concentrations vary significantly. A range of 10-30 ug/mL  may be an effective concentration for many patients. However, some  are best  treated at concentrations outside of this range. Acetaminophen  concentrations >150 ug/mL at 4 hours after ingestion  and >50 ug/mL at 12 hours after ingestion are often associated with  toxic reactions. Performed at [facility], [place] [address] [place], [place] [address]   cbc     Status: Abnormal   Collection Time: 08/31/19  4:05 PM  Result Value Ref Range   WBC 7.0 4.0 - 10.5 K/uL   RBC 3.80 (L) 3.87 - 5.11 MIL/uL   Hemoglobin 12.1 12.0 - 15.0 g/dL   HCT 35.9 (L) 36.0 - 46.0 %   MCV 94.5 80.0 - 100.0 fL   MCH 31.8 26.0 - 34.0 pg   MCHC 33.7 30.0 - 36.0 g/dL   RDW 13.2 11.5 - 15.5 %   Platelets 171 150 - 400 K/uL   nRBC 0.0 0.0 - 0.2 %    Comment: Performed at [facility], [place] [address], [place], [place] [address]  I-Stat beta hCG blood, ED     Status: None   Collection Time: 08/31/19  4:12 PM  Result Value Ref Range   I-stat hCG, quantitative <5.0 <5 mIU/mL   Comment 3            Comment:   GEST. AGE      CONC.  (mIU/mL)   <=1 WEEK        5 - 50     2 WEEKS       50 - 500     3 WEEKS       100 - 10,000     4 WEEKS     1,000 - 30,000        FEMALE AND NON-PREGNANT FEMALE:     LESS THAN 5 mIU/mL   Rapid urine drug screen (hospital performed)     Status: Abnormal   Collection Time: 09/01/19  6:22 AM  Result Value Ref Range   Opiates NONE DETECTED NONE DETECTED   Cocaine POSITIVE (A) NONE DETECTED   Benzodiazepines POSITIVE (A) NONE DETECTED   Amphetamines NONE DETECTED NONE DETECTED   Tetrahydrocannabinol NONE DETECTED NONE DETECTED   Barbiturates NONE DETECTED NONE DETECTED    Comment: (NOTE) DRUG SCREEN FOR MEDICAL PURPOSES ONLY.  IF CONFIRMATION IS NEEDED FOR ANY PURPOSE, NOTIFY LAB WITHIN 5 DAYS. LOWEST DETECTABLE LIMITS FOR URINE DRUG SCREEN Drug Class                     Cutoff (ng/mL) Amphetamine and metabolites    1000 Barbiturate and metabolites    200 Benzodiazepine                 628 Tricyclics and metabolites     300 Opiates and metabolites        300 Cocaine and  metabolites        300 THC                            50 Performed at [facility], [place] [address] [place], [place] [address]   POC SARS Coronavirus 2 Ag-ED - Nasal Swab (BD Veritor Kit)     Status: None   Collection Time: 09/01/19  4:28 PM  Result Value Ref Range   SARS Coronavirus 2 Ag NEGATIVE NEGATIVE    Comment: (NOTE) SARS-CoV-2 antigen NOT DETECTED.  Negative results are presumptive.  Negative results do not preclude SARS-CoV-2 infection and should not be used  as the sole basis for treatment or other patient management decisions, including infection  control decisions, particularly in the presence of clinical signs and  symptoms consistent with COVID-19, or in those who have been in contact with the virus.  Negative results must be combined with clinical observations, patient history, and epidemiological information. The expected result is Negative. Fact Sheet for Patients: PodPark.tn Fact Sheet for Healthcare Providers: GiftContent.is This test is not yet approved or cleared by the [place] FDA and  has been authorized for detection and/or diagnosis of SARS-CoV-2 by FDA under an Emergency Use Authorization (EUA).  This EUA will remain in effect (meaning this test can be used) for the duration of  the COVID-19 de claration under Section 564(b)(1) of the Act, 21 U.S.C. section 360bbb-3(b)(1), unless the authorization is terminated or revoked sooner.     Blood Alcohol level:  Lab Results  Component Value Date   ETH <10 08/31/2019   ETH <10 23/76/2831    Metabolic Disorder Labs:  Lab Results  Component Value Date   HGBA1C 5.3 07/17/2014   MPG 105 07/17/2014   MPG 111 05/03/2014   No results found for: PROLACTIN Lab Results  Component Value Date   CHOL 235 (H) 05/03/2014   TRIG 477 (H) 05/03/2014   HDL 59 05/03/2014   CHOLHDL 4.0 05/03/2014   VLDL UNABLE TO CALCULATE IF  TRIGLYCERIDE OVER 400 mg/dL 05/03/2014   LDLCALC UNABLE TO CALCULATE IF TRIGLYCERIDE OVER 400 mg/dL 05/03/2014    Current Medications: Current Facility-Administered Medications  Medication Dose Route Frequency Provider Last Rate Last Admin  . acetaminophen (TYLENOL) tablet 650 mg  650 mg Oral Q6H PRN Emmaline Kluver, FNP      . alum & mag hydroxide-simeth (MAALOX/MYLANTA) 200-200-20 MG/5ML suspension 30 mL  30 mL Oral Q4H PRN Emmaline Kluver, FNP      . hydrOXYzine (ATARAX/VISTARIL) tablet 25 mg  25 mg Oral Q6H PRN Emmaline Kluver, FNP      . hydrOXYzine (ATARAX/VISTARIL) tablet 25 mg  25 mg Oral TID PRN Emmaline Kluver, FNP      . levothyroxine (SYNTHROID) tablet 75 mcg  75 mcg Oral QAC breakfast Emmaline Kluver, FNP   75 mcg at 09/02/19 5176  . loperamide (IMODIUM) capsule 2-4 mg  2-4 mg Oral PRN Emmaline Kluver, FNP      . LORazepam (ATIVAN) tablet 1 mg  1 mg Oral Q6H PRN Emmaline Kluver, FNP      . LORazepam (ATIVAN) tablet 1 mg  1 mg Oral QID Emmaline Kluver, FNP   1 mg at 09/02/19 1607   Followed by  . [START ON 09/03/2019] LORazepam (ATIVAN) tablet 1 mg  1 mg Oral TID Emmaline Kluver, FNP       Followed by  . [START ON 09/04/2019] LORazepam (ATIVAN) tablet 1 mg  1 mg Oral BID Emmaline Kluver, FNP       Followed by  . [START ON 09/05/2019] LORazepam (ATIVAN) tablet 1 mg  1 mg Oral Daily Letitia Libra L, FNP      . magnesium hydroxide (MILK OF MAGNESIA) suspension 30 mL  30 mL Oral Daily PRN Emmaline Kluver, FNP      . multivitamin with minerals tablet 1 tablet  1 tablet Oral Daily Emmaline Kluver, FNP   1 tablet at 09/02/19 [phone]  . ondansetron (ZOFRAN-ODT) disintegrating tablet 4 mg  4 mg Oral Q6H PRN Emmaline Kluver, FNP   4 mg  at 09/02/19 4403  . thiamine (B-1) injection 100 mg  100 mg Intramuscular Once Emmaline Kluver, FNP      . thiamine tablet 100 mg  100 mg Oral Daily Emmaline Kluver, FNP   100 mg at 09/02/19 4742  . traZODone (DESYREL) tablet 50 mg  50 mg Oral QHS PRN Emmaline Kluver, FNP   50 mg at 09/01/19 2135   PTA  Medications: Medications Prior to Admission  Medication Sig Dispense Refill Last Dose  . Buprenorphine HCl-Naloxone HCl 8-2 MG FILM Take 1 tablet by mouth 2 (two) times daily.     . DULoxetine (CYMBALTA) 60 MG capsule Take 1 capsule (60 mg total) by mouth 2 (two) times daily. 60 capsule 2   . gabapentin (NEURONTIN) 600 MG tablet Take 1 tablet (600 mg total) by mouth 4 (four) times daily. 120 tablet 2   . lamoTRIgine (LAMICTAL) 100 MG tablet TAKE 1 TABLET BY MOUTH EVERY [place] 90 tablet 0   . levothyroxine (SYNTHROID, LEVOTHROID) 75 MCG tablet Take 1 tablet (75 mcg total) by mouth daily before breakfast. 90 tablet 0   . ondansetron (ZOFRAN ODT) 4 MG disintegrating tablet 19m ODT q4 hours prn nausea/vomit 20 tablet 0   . tiZANidine (ZANAFLEX) 4 MG tablet Take 4 mg by mouth 2 (two) times daily.     . traMADol (ULTRAM) 50 MG tablet Take 1 tablet (50 mg total) by mouth every 6 (six) hours as needed. 15 tablet 0   . zolpidem (AMBIEN) 10 MG tablet Take 10 mg by mouth at bedtime as needed for sleep.       Musculoskeletal: Strength & Muscle Tone: within normal limits  No tremors, no diaphoresis, no restlessness or agitation Gait & Station: normal Patient leans: Backward  Psychiatric Specialty Exam: Physical Exam  Review of Systems  Constitutional: Negative.   HENT: Negative.   Eyes: Negative.   Respiratory: Negative.  Negative for cough and shortness of breath.   Cardiovascular: Negative.  Negative for chest pain.  Gastrointestinal: Negative.  Negative for nausea and vomiting.  Endocrine: Negative.   Genitourinary: Negative.   Musculoskeletal: Negative.   Skin: Negative for rash.  Allergic/Immunologic: Negative.   Neurological: Negative for seizures and headaches.  Psychiatric/Behavioral: Positive for behavioral problems.  All other systems reviewed and are negative.   Blood pressure 114/81, pulse 86, temperature 98.1 F (36.7 C), temperature source Oral, resp. rate 18, height 5' 7" (1.702  m), weight 136.1 kg.Body mass index is 46.99 kg/m.  General Appearance: Fairly Groomed  Eye Contact:  Good  Speech:  Normal Rate  Volume:  Normal  Mood:  presents vaguely depressed, anxious   Affect:  congruent , improves partially during session  Thought Process:  Linear and Descriptions of Associations: Intact  Orientation:  Full (Time, Place, and Person)  Thought Content:  no hallucinations, no delusions , not internally preoccupied   Suicidal Thoughts:  No denies suicidal ideations , denies self injurious ideations, contracts for safety on unit   Homicidal Thoughts:  No denies homicidal or violent ideations  Memory:  she currently presents alert and at this time is oriented x 3. She reports no memory of events that led to admission  Judgement:  Fair  Insight:  Fair  Psychomotor Activity:  Normal no current tremors, no diaphoresis, no restlessness or agitation at this time  Concentration:  Concentration: Good and Attention Span: Good  Recall:  [place] Knowledge:  Good  Language:  Good  Akathisia:  Negative  Handed:  Right  AIMS (if indicated):     Assets:  Communication Skills Desire for Improvement Resilience  ADL's:  Intact  Cognition:  WNL  Sleep:  Number of Hours: 6.75    Treatment Plan Summary: Daily contact with patient to assess and evaluate symptoms and progress in treatment, Medication management, Plan inpatient admission and medications as below  Observation Level/Precautions:  15 minute checks  Laboratory:  as needed   Psychotherapy:  [place], group therapy   Medications: Have reviewed [place] Controlled Substance Registry alone with pharmacist, Jiles Garter. Patient is prescribed Suboxone 8/2 mg SL BID , and has been getting this prescribed consistently, weekly, most recently 2/1. She was prescribed Klonopin 2 mgr tablets #90 on 2/3 but prior to that no BZD prescriptions are noted on registry since 12/2018.   Patient reports she has been on Suboxone for several years and  states " it has been a Godsend for me, it has helped me stay off narcotics ". Will continue Suboxone as confirmed . Patient is currently not presenting with symptoms of BZD WDL and her vitals are stable . Ativan PRN for BZD WDL as needed  Continue Neurontin at 300 mgrs TID for now.  Continue Cymbalta at 60 mgrs BID Continue Lamictal 100 mgrs QDAY  Will not continue Ambien at this time. * I have reviewed risks associated with BZD/Opiate concomitant use with patient, including risk of respiratory depression/death.    Consultations:  As needed   Discharge Concerns:  -  Estimated LOS: 3-4 days   Other:     Physician Treatment Plan for Primary Diagnosis:  S/P BZD Overdose  Long Term Goal(s): Improvement in symptoms so as ready for discharge  Short Term Goals: Ability to identify changes in lifestyle to reduce recurrence of condition will improve and Ability to identify triggers associated with substance abuse/mental health issues will improve  Physician Treatment Plan for Secondary Diagnosis: Opiate Use Disorder on Agonist Therapy with Buprenorphine Long Term Goal(s): Improvement in symptoms so as ready for discharge  Short Term Goals: Ability to identify changes in lifestyle to reduce recurrence of condition will improve and Ability to identify triggers associated with substance abuse/mental health issues will improve  I certify that inpatient services furnished can reasonably be expected to improve the patient's condition.    Jenne Campus, MD 2/6/202110:15 AM
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
## 2019-09-02 NOTE — BHH Suicide Risk Assessment (Signed)
[facility] Admission Suicide Risk Assessment   Nursing information obtained from:   patient and chart  Demographic factors:  Caucasian Current Mental Status:  NA Loss Factors:  NA Historical Factors:  NA Risk Reduction Factors:  NA  Total Time spent with patient: 45 minutes Principal Problem:  S/P BZD Overdose  Diagnosis:  Active Problems:   MDD (major depressive disorder)  Subjective Data:   Continued Clinical Symptoms:  Alcohol Use Disorder Identification Test Final Score (AUDIT): 0 The "Alcohol Use Disorders Identification Test", Guidelines for Use in Primary Care, Second Edition.  World Pharmacologist [facility]). Score between 0-7:  no or low risk or alcohol related problems. Score between 8-15:  moderate risk of alcohol related problems. Score between 16-19:  high risk of alcohol related problems. Score 20 or above:  warrants further diagnostic evaluation for alcohol dependence and treatment.   CLINICAL FACTORS:  44 year old female, presented to ED on 2/4 via EMS. Was agitated and attempted to jump out of EMS vehicle on route , found to have a bottle for Klonopin 2 mgrs # 90 prescribed 2/3 with 54 tablets left . Of note, patient also reports ( confirmed via [place] controlled substance registry) being on Suboxone management for a history of opiate use disorder .  Patient reports she has no memory of events leading up to admission, but states she had been doing well recently and denies recent depression or any suicidal ideations. She acknowledges being prescribed BZD for anxiety, panic, but states was not abusing or misusing .    Psychiatric Specialty Exam: Physical Exam  Review of Systems  Blood pressure 114/81, pulse 86, temperature 98.1 F (36.7 C), temperature source Oral, resp. rate 18, height 5\' 7"  (1.702 m), weight 136.1 kg.Body mass index is 46.99 kg/m.  See admit note MSE  COGNITIVE FEATURES THAT CONTRIBUTE TO RISK:  Closed-mindedness and Loss of executive function     SUICIDE RISK:   Moderate:  Frequent suicidal ideation with limited intensity, and duration, some specificity in terms of plans, no associated intent, good self-control, limited dysphoria/symptomatology, some risk factors present, and identifiable protective factors, including available and accessible social support.  PLAN OF CARE: Patient will be admitted to inpatient psychiatric unit for stabilization and safety. Will provide and encourage milieu participation. Provide medication management and maked adjustments as needed. Will also provide medication management to address WDL as needed . Will follow daily.    I certify that inpatient services furnished can reasonably be expected to improve the patient's condition.   Jenne Campus, MD 09/02/2019, 11:02 AM

## 2019-09-02 NOTE — Progress Notes (Signed)
   09/02/19 2219  COVID-19 Daily Checkoff  Have you had a fever (temp > 37.80C/100F)  in the past 24 hours?  No  If you have had runny nose, nasal congestion, sneezing in the past 24 hours, has it worsened? No  COVID-19 EXPOSURE  Have you traveled outside the state in the past 14 days? No  Have you been in contact with someone with a confirmed diagnosis of COVID-19 or PUI in the past 14 days without wearing appropriate PPE? No  Have you been living in the same home as a person with confirmed diagnosis of COVID-19 or a PUI (household contact)? No  Have you been diagnosed with COVID-19? No

## 2019-09-02 NOTE — Progress Notes (Signed)
   09/01/19 2135  Psych Admission Type (Psych Patients Only)  Admission Status Involuntary  Psychosocial Assessment  Patient Complaints None  Eye Contact Fair  Facial Expression Anxious  Affect Angry  Speech Logical/coherent  Interaction Assertive  Motor Activity Slow  Appearance/Hygiene Body odor;In scrubs  Behavior Characteristics Appropriate to situation;Cooperative  Mood Anxious  Thought Process  Coherency WDL  Content WDL  Delusions WDL;None reported or observed  Perception WDL  Hallucination None reported or observed  Judgment Impaired  Confusion WDL  Danger to Self  Current suicidal ideation? Denies  Danger to Others  Danger to Others None reported or observed

## 2019-09-02 NOTE — BHH Group Notes (Signed)
LCSW Group Therapy Note  09/02/2019    10:00-11:00am   Type of Therapy and Topic:  Group Therapy: Anger and Coping Skills  Participation Level:  Did Not Attend   Description of Group:   In this group, patients learned how to recognize the physical, cognitive, emotional, and behavioral responses they have to anger-provoking situations.  They identified how they usually or often react when angered, and learned how healthy and unhealthy coping skills work initially, but the unhealthy ones stop working.   They analyzed how their frequently-chosen coping skill is possibly beneficial and how it is possibly unhelpful.  The group discussed a variety of healthier coping skills that could help in resolving the actual issues, as well as how to go about planning for the the possibility of future similar situations.  Therapeutic Goals: Patients will identify one thing that makes them angry and how they feel emotionally and physically, what their thoughts are or tend to be in those situations, and what healthy or unhealthy coping mechanism they typically use Patients will identify how their coping technique works for them, as well as how it works against them. Patients will explore possible new behaviors to use in future anger situations. Patients will learn that anger itself is normal and cannot be eliminated, and that healthier coping skills can assist with resolving conflict rather than worsening situations.  Summary of Patient Progress:  DID NOT ATTEND  Therapeutic Modalities:   Cognitive Behavioral Therapy Motivation Interviewing  [name]  .

## 2019-09-02 NOTE — Progress Notes (Signed)
Pt presents with a flat affect and a labile mood. Pt constantly at the nurses station asking for suboxone. Writer informed the pt that she will be assessed by the psychiatrist and will have the opportunity to discuss medications at that time. This morning the patient was seen tearful and upset. The patient expressed she's confused as to why she's hospitalized and doesn't know where her car is.   Orders reviewed with pt. MHT notified to assessed the pt's vital signs. Verbal support provided. 15 minute checks performed for safety.  Pt compliant with taking scheduled medications this am. Pt refused to attend scheduled groups this morning.

## 2019-09-02 NOTE — Progress Notes (Signed)
Pt at nurses station often requesting additional medication and asking when she may next receive medication.  Pt told this staff member that she did not receive her 1700 dose of Suboxone and requested dose.  Confirmed both in computer and with dayshift RN that patient did receive dose.  Pt states that she can not take Trazodone but is used to taking Seroquel 400 mg.  This is not in patient's list of home medications.  NP notified of request for Seroquel rather than Trazodone and order received for Seroquel 100 mg.  Pt requested zofran twice in a two hour time period.  Explained to patient how often she can receive the medication.  Have had to reinforce medication times frequently, patient asks "can I get it an hour early?".  Explained to patient why this is not possible and understanding verbalized each time.  Appears to be restless.  Will continue to monitor.

## 2019-09-02 NOTE — Progress Notes (Signed)
   09/02/19 2221  Psych Admission Type (Psych Patients Only)  Admission Status Involuntary  Psychosocial Assessment  Patient Complaints Anxiety  Eye Contact Fair  Facial Expression Anxious;Animated  Affect Appropriate to circumstance;Apprehensive  Speech Logical/coherent  Interaction Assertive;Attention-seeking  Motor Activity Fidgety  Appearance/Hygiene Disheveled  Behavior Characteristics Anxious;Irritable;Fidgety  Mood Labile;Anxious;Preoccupied  Thought Process  Content WDL  Delusions None reported or observed  Perception WDL  Hallucination None reported or observed  Judgment Impaired  Confusion None  Danger to Self  Current suicidal ideation? Denies  Danger to Others  Danger to Others None reported or observed

## 2019-09-03 ENCOUNTER — Other Ambulatory Visit: Payer: Self-pay | Admitting: Adult Health

## 2019-09-03 DIAGNOSIS — T424X4A Poisoning by benzodiazepines, undetermined, initial encounter: Secondary | ICD-10-CM

## 2019-09-03 DIAGNOSIS — F39 Unspecified mood [affective] disorder: Secondary | ICD-10-CM

## 2019-09-03 MED ORDER — NICOTINE POLACRILEX 2 MG MT GUM: 2.0000 mg | CHEWING_GUM | OROMUCOSAL | 0 refills | Status: AC | PRN

## 2019-09-03 MED ORDER — ADULT MULTIVITAMIN W/MINERALS CH: 1.0000 | ORAL_TABLET | Freq: Every day | ORAL | Status: AC

## 2019-09-03 MED ORDER — GABAPENTIN 300 MG PO CAPS: 300.0000 mg | ORAL_CAPSULE | Freq: Three times a day (TID) | ORAL | 0 refills | Status: DC

## 2019-09-03 MED ORDER — DULOXETINE HCL 60 MG PO CPEP: 60.0000 mg | ORAL_CAPSULE | Freq: Every day | ORAL | 0 refills | Status: AC

## 2019-09-03 MED ORDER — LAMOTRIGINE 100 MG PO TABS: 100.0000 mg | ORAL_TABLET | Freq: Every day | ORAL | 0 refills | Status: DC

## 2019-09-03 MED ORDER — LEVOTHYROXINE SODIUM 75 MCG PO TABS: 75.0000 ug | ORAL_TABLET | Freq: Every day | ORAL | 0 refills | Status: AC

## 2019-09-03 MED ORDER — QUETIAPINE FUMARATE 100 MG PO TABS: 100.0000 mg | ORAL_TABLET | Freq: Every day | ORAL | 0 refills | Status: DC

## 2019-09-03 NOTE — Progress Notes (Signed)
Nursing discharge note: Patient discharged home per MD order.  Patient received all personal belongings from unit and locker.  Reviewed AVS/transition record with patient and she indicates understanding.  Patient will return to outpatient at [facility].  Patient denies any thoughts of self harm.  She left ambulatory with a friend.
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
## 2019-09-03 NOTE — BHH Suicide Risk Assessment (Signed)
[facility] INPATIENT:  Family/Significant Other Suicide Prevention Education  Suicide Prevention Education:  Patient Refusal for Family/Significant Other Suicide Prevention Education: The patient [name] has refused to provide written consent for family/significant other to be provided Family/Significant Other Suicide Prevention Education during admission and/or prior to discharge.  Physician notified.  Berlin Hun Grossman-Orr 09/03/2019, 10:07 AM
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
## 2019-09-03 NOTE — BHH Group Notes (Signed)
Adult Psychoeducational Group Note  Date:  09/03/2019 Time:  10:49 AM  Group Topic/Focus:  Progressive Relaxation is a group where we do progressive relaxation starting from the head down. Teach deep breathing technique.   Participation Level:  Active  Participation Quality:  Intrusive  Affect:  Blunted  Cognitive:  Oriented  Insight: Improving  Engagement in Group:  Engaged  Modes of Intervention:  Activity and Support  Additional Comments:  Pt attempted several times to interpret others feelings in the group and attempted to take over the group twice.   [name] 09/03/2019, 10:49 AM

## 2019-09-03 NOTE — BHH Group Notes (Signed)
[facility] LCSW Group Therapy Note  Date/Time:  09/03/2019 10:00-11:00AM  Type of Therapy and Topic:  Group Therapy:  Healthy and Unhealthy Supports  Participation Level:  Active   Description of Group:  Patients in this group were introduced to the idea of adding a variety of healthy supports to address the various needs in their lives.Patients discussed what additional healthy supports could be helpful in their recovery and wellness after discharge in order to prevent future hospitalizations.   An emphasis was placed on using counselor, doctor, therapy groups, 12-step groups, and problem-specific support groups to expand supports.  Several songs were played to emphasize points made throughout group.  Therapeutic Goals:   1)  discuss importance of adding supports to stay well once out of the hospital  2)  compare healthy versus unhealthy supports and identify some examples of each  3)  generate ideas and descriptions of healthy supports that can be added  4)  offer mutual support about how to address unhealthy supports  5)  encourage active participation in and adherence to discharge plan    Summary of Patient Progress:  The patient was only present for the initial part of group, then she discharged.  While there, she continuously interrupted other patients, who called her on this action.  She was subdued and apologetic about this, but continued to do so although seeming to be unaware when she was interrupting.   Therapeutic Modalities:   Motivational Interviewing Brief Solution-Focused Therapy  Selmer Dominion, LCSW

## 2019-09-03 NOTE — Progress Notes (Signed)
   09/03/19 0900  Psych Admission Type (Psych Patients Only)  Admission Status Involuntary  Psychosocial Assessment  Patient Complaints None  Eye Contact Fair  Facial Expression Anxious;Animated  Affect Appropriate to circumstance;Apprehensive  Speech Logical/coherent  Interaction Assertive;Attention-seeking  Motor Activity Fidgety  Appearance/Hygiene Unremarkable  Behavior Characteristics Cooperative  Mood Labile  Aggressive Behavior  Effect No apparent injury  Thought Process  Coherency WDL  Content WDL  Delusions None reported or observed  Perception WDL  Hallucination None reported or observed  Judgment Impaired  Confusion None  Danger to Self  Current suicidal ideation? Denies  Danger to Others  Danger to Others None reported or observed

## 2019-09-03 NOTE — Progress Notes (Signed)
  [facility] Adult [facility] Management Discharge Plan :  Will you be returning to the same living situation after discharge:  Yes,  alone At discharge, do you have transportation home?: Yes,  being picked up in her car Do you have the ability to pay for your medications: Yes,  has income and insurance  Release of information consent forms completed and patient has declined for these to be forwarded to aftercare provider  Patient to Follow up at: Follow-up Information    [facility].   Specialty: Behavioral Health Why: Return to Intensive Outpatient Program.  Patient declines for records from this hospital stay to be sent to this facility. Contact information: [place] [address] [phone]           Next level of care provider has access to [place] and Suicide Prevention discussed: No.  Declined  Have you used any form of tobacco in the last 30 days? (Cigarettes, Smokeless Tobacco, Cigars, and/or Pipes): Yes  Has patient been referred to the Quitline?: Patient refused referral  Patient has been referred for addiction treatment: Yes  Maretta Los, LCSW 09/03/2019, 10:06 AM

## 2020-09-23 ENCOUNTER — Other Ambulatory Visit: Payer: Self-pay | Admitting: Nurse Practitioner

## 2020-09-23 DIAGNOSIS — M25562 Pain in left knee: Secondary | ICD-10-CM

## 2020-10-12 ENCOUNTER — Inpatient Hospital Stay: Admission: RE | Admit: 2020-10-12 | Payer: Medicare HMO | Source: Ambulatory Visit

## 2020-11-17 ENCOUNTER — Inpatient Hospital Stay: Admission: RE | Admit: 2020-11-17 | Payer: Medicare HMO | Source: Ambulatory Visit

## 2023-08-01 ENCOUNTER — Other Ambulatory Visit: Payer: Self-pay

## 2023-08-01 ENCOUNTER — Emergency Department (HOSPITAL_COMMUNITY): Payer: Medicare HMO

## 2023-08-01 ENCOUNTER — Encounter (HOSPITAL_COMMUNITY): Payer: Self-pay

## 2023-08-01 ENCOUNTER — Emergency Department (HOSPITAL_COMMUNITY)
Admission: EM | Admit: 2023-08-01 | Discharge: 2023-08-01 | Disposition: A | Discharge status: Home / Self Care | Payer: Medicare HMO | Attending: Emergency Medicine | Admitting: Emergency Medicine

## 2023-08-01 DIAGNOSIS — S60031A Contusion of right middle finger without damage to nail, initial encounter: Secondary | ICD-10-CM | POA: Insufficient documentation

## 2023-08-01 DIAGNOSIS — S60041A Contusion of right ring finger without damage to nail, initial encounter: Secondary | ICD-10-CM | POA: Diagnosis not present

## 2023-08-01 DIAGNOSIS — S6991XA Unspecified injury of right wrist, hand and finger(s), initial encounter: Secondary | ICD-10-CM | POA: Diagnosis present

## 2023-08-01 DIAGNOSIS — E039 Hypothyroidism, unspecified: Secondary | ICD-10-CM | POA: Diagnosis not present

## 2023-08-01 DIAGNOSIS — Y92410 Unspecified street and highway as the place of occurrence of the external cause: Secondary | ICD-10-CM | POA: Diagnosis not present

## 2023-08-01 DIAGNOSIS — Z79899 Other long term (current) drug therapy: Secondary | ICD-10-CM | POA: Diagnosis not present

## 2023-08-01 DIAGNOSIS — S60042A Contusion of left ring finger without damage to nail, initial encounter: Secondary | ICD-10-CM | POA: Diagnosis not present

## 2023-08-01 DIAGNOSIS — T07XXXA Unspecified multiple injuries, initial encounter: Secondary | ICD-10-CM

## 2023-08-01 DIAGNOSIS — R0902 Hypoxemia: Secondary | ICD-10-CM | POA: Diagnosis present

## 2023-08-01 DIAGNOSIS — R4182 Altered mental status, unspecified: Secondary | ICD-10-CM | POA: Diagnosis present

## 2023-08-01 LAB — COMPREHENSIVE METABOLIC PANEL
ALT: 22 U/L (ref 0–44)
AST: 26 U/L (ref 15–41)
Albumin: 4.2 g/dL (ref 3.5–5.0)
Alkaline Phosphatase: 60 U/L (ref 38–126)
Anion gap: 9 (ref 5–15)
BUN: 16 mg/dL (ref 6–20)
CO2: 27 mmol/L (ref 22–32)
Calcium: 9.6 mg/dL (ref 8.9–10.3)
Chloride: 103 mmol/L (ref 98–111)
Creatinine, Ser: 1.29 mg/dL — ABNORMAL HIGH (ref 0.44–1.00)
GFR, Estimated: 51 mL/min — ABNORMAL LOW (ref >60)
Glucose, Bld: 121 mg/dL — ABNORMAL HIGH (ref 70–99)
Potassium: 3.9 mmol/L (ref 3.5–5.1)
Sodium: 139 mmol/L (ref 135–145)
Total Bilirubin: 0.5 mg/dL (ref 0.0–1.2)
Total Protein: 7.3 g/dL (ref 6.5–8.1)

## 2023-08-01 LAB — CBC WITH DIFFERENTIAL/PLATELET
Abs Immature Granulocytes: 0.05 10*3/uL (ref 0.00–0.07)
Basophils Absolute: 0 10*3/uL (ref 0.0–0.1)
Basophils Relative: 1 %
Eosinophils Absolute: 0.2 10*3/uL (ref 0.0–0.5)
Eosinophils Relative: 2 %
HCT: 37.5 % (ref 36.0–46.0)
Hemoglobin: 12.5 g/dL (ref 12.0–15.0)
Immature Granulocytes: 1 %
Lymphocytes Relative: 23 %
Lymphs Abs: 1.9 10*3/uL (ref 0.7–4.0)
MCH: 31.8 pg (ref 26.0–34.0)
MCHC: 33.3 g/dL (ref 30.0–36.0)
MCV: 95.4 fL (ref 80.0–100.0)
Monocytes Absolute: 0.6 10*3/uL (ref 0.1–1.0)
Monocytes Relative: 7 %
Neutro Abs: 5.5 10*3/uL (ref 1.7–7.7)
Neutrophils Relative %: 66 %
Platelets: 185 10*3/uL (ref 150–400)
RBC: 3.93 MIL/uL (ref 3.87–5.11)
RDW: 13.6 % (ref 11.5–15.5)
WBC: 8.1 10*3/uL (ref 4.0–10.5)
nRBC: 0 % (ref 0.0–0.2)

## 2023-08-01 LAB — RAPID URINE DRUG SCREEN, HOSP PERFORMED
Amphetamines: POSITIVE — AB
Barbiturates: NOT DETECTED
Benzodiazepines: POSITIVE — AB
Cocaine: POSITIVE — AB
Opiates: NOT DETECTED
Tetrahydrocannabinol: POSITIVE — AB

## 2023-08-01 LAB — ACETAMINOPHEN LEVEL: Acetaminophen (Tylenol), Serum: <10 ug/mL — ABNORMAL LOW (ref 10–30)

## 2023-08-01 LAB — HCG, SERUM, QUALITATIVE: Preg, Serum: NEGATIVE

## 2023-08-01 LAB — ETHANOL: Alcohol, Ethyl (B): <10 mg/dL (ref <10)

## 2023-08-01 LAB — SALICYLATE LEVEL: Salicylate Lvl: <7 mg/dL — ABNORMAL LOW (ref 7.0–30.0)
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
## 2023-08-01 NOTE — ED Notes (Signed)
 Pt awake and talking, pt tolerating PO fluids. Pt father called to pick pt up. Will be here in 45 minutes
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
## 2023-08-01 NOTE — ED Notes (Addendum)
 Pt's O2 dropped to 84% while sitting in triage room, pt placed on 2L nasal cannula with improvement to 93%-95% and awakes to verbal stimuli.
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
## 2023-08-13 ENCOUNTER — Emergency Department (HOSPITAL_COMMUNITY): Payer: Medicare HMO

## 2023-08-13 ENCOUNTER — Other Ambulatory Visit: Payer: Self-pay

## 2023-08-13 ENCOUNTER — Emergency Department (HOSPITAL_COMMUNITY)
Admission: EM | Admit: 2023-08-13 | Discharge: 2023-08-14 | Disposition: A | Discharge status: Home / Self Care | Payer: Medicare HMO | Attending: Emergency Medicine | Admitting: Emergency Medicine

## 2023-08-13 DIAGNOSIS — E039 Hypothyroidism, unspecified: Secondary | ICD-10-CM | POA: Insufficient documentation

## 2023-08-13 DIAGNOSIS — F23 Brief psychotic disorder: Secondary | ICD-10-CM | POA: Insufficient documentation

## 2023-08-13 DIAGNOSIS — F22 Delusional disorders: Secondary | ICD-10-CM | POA: Insufficient documentation

## 2023-08-13 DIAGNOSIS — F29 Unspecified psychosis not due to a substance or known physiological condition: Secondary | ICD-10-CM

## 2023-08-13 DIAGNOSIS — F411 Generalized anxiety disorder: Secondary | ICD-10-CM | POA: Diagnosis not present

## 2023-08-13 DIAGNOSIS — F319 Bipolar disorder, unspecified: Secondary | ICD-10-CM | POA: Diagnosis not present

## 2023-08-13 DIAGNOSIS — Z85828 Personal history of other malignant neoplasm of skin: Secondary | ICD-10-CM | POA: Insufficient documentation

## 2023-08-13 DIAGNOSIS — F1994 Other psychoactive substance use, unspecified with psychoactive substance-induced mood disorder: Secondary | ICD-10-CM | POA: Diagnosis not present

## 2023-08-13 DIAGNOSIS — R443 Hallucinations, unspecified: Secondary | ICD-10-CM | POA: Diagnosis present

## 2023-08-13 LAB — CBC WITH DIFFERENTIAL/PLATELET
Abs Immature Granulocytes: 0.01 10*3/uL (ref 0.00–0.07)
Basophils Absolute: 0 10*3/uL (ref 0.0–0.1)
Basophils Relative: 1 %
Eosinophils Absolute: 0.2 10*3/uL (ref 0.0–0.5)
Eosinophils Relative: 3 %
HCT: 38.3 % (ref 36.0–46.0)
Hemoglobin: 12.6 g/dL (ref 12.0–15.0)
Immature Granulocytes: 0 %
Lymphocytes Relative: 35 %
Lymphs Abs: 2 10*3/uL (ref 0.7–4.0)
MCH: 31.7 pg (ref 26.0–34.0)
MCHC: 32.9 g/dL (ref 30.0–36.0)
MCV: 96.5 fL (ref 80.0–100.0)
Monocytes Absolute: 0.5 10*3/uL (ref 0.1–1.0)
Monocytes Relative: 9 %
Neutro Abs: 3 10*3/uL (ref 1.7–7.7)
Neutrophils Relative %: 52 %
Platelets: 197 10*3/uL (ref 150–400)
RBC: 3.97 MIL/uL (ref 3.87–5.11)
RDW: 13.8 % (ref 11.5–15.5)
WBC: 5.8 10*3/uL (ref 4.0–10.5)
nRBC: 0 % (ref 0.0–0.2)

## 2023-08-13 LAB — HCG, SERUM, QUALITATIVE: Preg, Serum: NEGATIVE

## 2023-08-13 LAB — COMPREHENSIVE METABOLIC PANEL
ALT: 17 U/L (ref 0–44)
AST: 17 U/L (ref 15–41)
Albumin: 4.1 g/dL (ref 3.5–5.0)
Alkaline Phosphatase: 59 U/L (ref 38–126)
Anion gap: 10 (ref 5–15)
BUN: 18 mg/dL (ref 6–20)
CO2: 25 mmol/L (ref 22–32)
Calcium: 8.9 mg/dL (ref 8.9–10.3)
Chloride: 101 mmol/L (ref 98–111)
Creatinine, Ser: 1.32 mg/dL — ABNORMAL HIGH (ref 0.44–1.00)
GFR, Estimated: 49 mL/min — ABNORMAL LOW (ref >60)
Glucose, Bld: 116 mg/dL — ABNORMAL HIGH (ref 70–99)
Potassium: 3.6 mmol/L (ref 3.5–5.1)
Sodium: 136 mmol/L (ref 135–145)
Total Bilirubin: 0.5 mg/dL (ref 0.0–1.2)
Total Protein: 6.9 g/dL (ref 6.5–8.1)

## 2023-08-13 LAB — ETHANOL: Alcohol, Ethyl (B): <10 mg/dL (ref <10)

## 2023-08-13 LAB — SALICYLATE LEVEL: Salicylate Lvl: <7 mg/dL — ABNORMAL LOW (ref 7.0–30.0)

## 2023-08-13 LAB — ACETAMINOPHEN LEVEL: Acetaminophen (Tylenol), Serum: <10 ug/mL — ABNORMAL LOW (ref 10–30)

## 2023-08-13 LAB — CBG MONITORING, ED: Glucose-Capillary: 106 mg/dL — ABNORMAL HIGH (ref 70–99)

## 2023-08-13 NOTE — ED Notes (Signed)
Collected pt belongings with some medication brought by pt and secured to locker 21

## 2023-08-13 NOTE — ED Triage Notes (Signed)
Pt brought by EMS  from home with complains of AMS. As per EMS son called them because of AMS and having some hallucination. As per EMS this is not her baseline but pt have dementia and taking Seroquel and potentially over dosed it . Pt able to answer some question but not directly. CBG 139.

## 2023-08-14 DIAGNOSIS — F29 Unspecified psychosis not due to a substance or known physiological condition: Secondary | ICD-10-CM

## 2023-08-14 DIAGNOSIS — F1994 Other psychoactive substance use, unspecified with psychoactive substance-induced mood disorder: Secondary | ICD-10-CM

## 2023-08-14 LAB — RAPID URINE DRUG SCREEN, HOSP PERFORMED
Amphetamines: POSITIVE — AB
Barbiturates: NOT DETECTED
Benzodiazepines: NOT DETECTED
Cocaine: NOT DETECTED
Opiates: NOT DETECTED
Tetrahydrocannabinol: POSITIVE — AB

## 2023-08-14 MED ORDER — LORAZEPAM 0.5 MG PO TABS
0.5000 mg | ORAL_TABLET | Freq: Once | ORAL | Status: AC
  Administered 2023-08-14: 0.5 mg via ORAL
  Filled 2023-08-14: qty 1

## 2023-08-14 MED ORDER — BUPRENORPHINE HCL-NALOXONE HCL 8-2 MG SL SUBL
1.0000 | SUBLINGUAL_TABLET | Freq: Two times a day (BID) | SUBLINGUAL | Status: DC
  Filled 2023-08-14: qty 1

## 2023-08-14 MED ORDER — BUPRENORPHINE HCL-NALOXONE HCL 8-2 MG SL SUBL
1.0000 | SUBLINGUAL_TABLET | Freq: Two times a day (BID) | SUBLINGUAL | Status: DC
  Administered 2023-08-14: 1 via SUBLINGUAL
  Filled 2023-08-14: qty 1

## 2023-08-14 MED ORDER — CLONAZEPAM 1 MG PO TABS
1.0000 mg | ORAL_TABLET | Freq: Once | ORAL | Status: AC
  Administered 2023-08-14: 1 mg via ORAL
  Filled 2023-08-14: qty 1

## 2023-08-14 MED ORDER — HYDROXYZINE HCL 25 MG PO TABS
25.0000 mg | ORAL_TABLET | Freq: Three times a day (TID) | ORAL | Status: DC | PRN
  Filled 2023-08-14: qty 1

## 2023-08-14 MED ORDER — NICOTINE 7 MG/24HR TD PT24
7.0000 mg | MEDICATED_PATCH | Freq: Every day | TRANSDERMAL | Status: DC | PRN
  Administered 2023-08-14: 7 mg via TRANSDERMAL
  Filled 2023-08-14: qty 1

## 2023-08-14 MED ORDER — ONDANSETRON 4 MG PO TBDP
4.0000 mg | ORAL_TABLET | Freq: Once | ORAL | Status: AC
  Administered 2023-08-14: 4 mg via ORAL
  Filled 2023-08-14: qty 1

## 2023-08-14 MED ORDER — ACETAMINOPHEN 325 MG PO TABS: 650.0000 mg | ORAL_TABLET | ORAL | Status: DC | PRN

## 2023-08-14 NOTE — Progress Notes (Signed)
TOC consulted for substance abuse resources. Resources attached to AVS. No further TOC needs.

## 2023-08-14 NOTE — ED Provider Notes (Signed)
Patient's medication list shows that she was on Suboxone twice a [place].  So I have reordered that for her.  Patient has been cleared by behavioral health and cleared by social services for discharge.  The problem is she does not have a ride to go home today.  So she may not be able to her discharge until tomorrow according to nurse.   Vanetta Mulders, MD 08/14/23 [phone]

## 2023-08-14 NOTE — ED Notes (Signed)
Atarax was prescribed for anxiety, however pt refused it because she states it doesn't work. She requested for klonopin instead. Informed Psych NP

## 2023-08-14 NOTE — ED Notes (Signed)
Pt states that we can call her former spouse Barbara Cower Fanelli to pick her up. She also wants him to know that she will pay for gas money and give him some money for giving her a ride home. Jason's number is [phone].
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
## 2023-08-14 NOTE — ED Provider Notes (Signed)
Patient cleared by Child psychotherapist and behavioral health for discharge home.  Had renewed patient's Suboxone prescription here while she was in the hospital we thought maybe she would have a ride till tomorrow.  Patient is going to use a taxi to get home.  She has Suboxone at home.  Patient ready for discharge.   Vanetta Mulders, MD 08/14/23 [phone]

## 2023-08-14 NOTE — ED Notes (Signed)
Patient moved to room 28 in TCU    belongings were placed in locker 28

## 2023-08-14 NOTE — ED Notes (Addendum)
Patient states that she doesn't need to stay overnight and would like to know whether she is going home today. If so she would like to call her dada to pick her up. I informed NP Geralynn Ochs. She also asked for something for anxiety because she feels anxious

## 2023-08-14 NOTE — ED Provider Notes (Incomplete Revision)
Emergency Medicine Observation Re-evaluation Note  [name] is a 48 y.o. female, seen on rounds today.  Pt initially presented to the ED for complaints of No chief complaint on file. Currently, the patient is sleeping.  Physical Exam  BP 132/84 (BP Location: Left Arm)   Pulse 73   Temp 98.2 F (36.8 C) (Oral)   Resp 16   Ht 5\' 7"  (1.702 m)   Wt 107.5 kg   SpO2 92%   BMI 37.12 kg/m  Physical Exam General: sleeping Cardiac: well-perfused Lungs: no resp distress Psych: resting  ED Course / MDM  EKG:   I have reviewed the labs performed to date as well as medications administered while in observation.  Recent changes in the last 24 hours include presented for son calling d/t possible hallucinating, anxiety/paranoia. Labs reassuring. CTH/CXR wnl.   -Added CIWA measurements q6h d/t h/o EtOH withdrawal  1:16 PM Advised that psychiatry is clearing the patient with consult for TOC and substance abuse.   Plan  Current plan is for DC after TOC consult/substance abuse info.     Loetta Rough, MD 08/14/23 [phone]

## 2023-08-14 NOTE — ED Notes (Signed)
Pt had TTS consult with psych NP. She asked for some broth because she said she did not feel like eating much. Provided patient with some beef broth. I also informed her of telesitter.
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
## 2023-08-14 NOTE — ED Notes (Signed)
I was able to reach patient's father. He confirms that the house is unlocked.

## 2023-08-14 NOTE — ED Notes (Signed)
Pt was calling out for help. When I went she stated that she has a cramp in her legs and needed to stand up. I helped her up and eventually got her a foot stool to rest her foot on it. Also gave her some water to help with hydration.

## 2023-08-14 NOTE — ED Notes (Signed)
I spoke with Barbara Cower, and he said that he is not picking her up and that her parents can handle that situation.

## 2023-08-14 NOTE — ED Notes (Signed)
Patient's son called and asked to speak with his mom. I took the phone to her.

## 2023-08-14 NOTE — Discharge Instructions (Addendum)

## 2023-08-14 NOTE — ED Notes (Signed)
Spoke with Maisie Fus at the tele monitor department and he is going to turn the camera on and call me back. Provided him with the necessary information.

## 2023-08-14 NOTE — ED Provider Notes (Cosign Needed Addendum)
[facility] Emergency Department Provider Note MRN:  [id number]  Arrival date & time: 08/14/23     Chief Complaint    Hallucinations History of Present Illness   [name] is a 48 y.o. year-old female presents to the ED with chief complaint of hallucinations.  EMS called by son because she wasn't acting right and was reportedly hallucinating.  Patient states that she is anxious and feels paranoid and unsafe.  She Denies any recent illness or drug use, but does have hx of polysubstance abuse.  Marland Kitchen  History provided by patient.   Review of Systems  Pertinent positive and negative review of systems noted in HPI.    Physical Exam   Vitals:   08/14/23 1233 08/14/23 2019  BP: 132/75   Pulse: 71 78  Resp: 18 (!) 2  Temp: 98.3 F (36.8 C) 98.3 F (36.8 C)  SpO2: 96% 99%    CONSTITUTIONAL:  non toxic-appearing, NAD NEURO:  Alert and oriented x 3, CN 3-12 grossly intact EYES:  eyes equal and reactive ENT/NECK:  Supple, no stridor  CARDIO:  normal rate, regular rhythm, appears well-perfused  PULM:  No respiratory distress, CTAB GI/GU:  non-distended, no focal abdominal tenderness MSK/SPINE:  No gross deformities, no edema, moves all extremities  SKIN:  no rash, atraumatic   *Additional and/or pertinent findings included in MDM below  Diagnostic and Interventional Summary    EKG Interpretation Date/Time:  Friday August 13 2023 23:02:44 EST Ventricular Rate:  91 PR Interval:  134 QRS Duration:  96 QT Interval:  340 QTC Calculation: 419 R Axis:   24  Text Interpretation: Sinus rhythm Low voltage, precordial leads Confirmed by Vivi Barrack [phone]) on 08/14/2023 7:20:32 AM       Labs Reviewed  COMPREHENSIVE METABOLIC PANEL - Abnormal; Notable for the following components:      Result Value   Glucose, Bld 116 (*)    Creatinine, Ser 1.32 (*)    GFR, Estimated 49 (*)    All other components within normal limits  SALICYLATE LEVEL - Abnormal;  Notable for the following components:   Salicylate Lvl <7.0 (*)    All other components within normal limits  ACETAMINOPHEN LEVEL - Abnormal; Notable for the following components:   Acetaminophen (Tylenol), Serum <10 (*)    All other components within normal limits  RAPID URINE DRUG SCREEN, HOSP PERFORMED - Abnormal; Notable for the following components:   Amphetamines POSITIVE (*)    Tetrahydrocannabinol POSITIVE (*)    All other components within normal limits  CBG MONITORING, ED - Abnormal; Notable for the following components:   Glucose-Capillary 106 (*)    All other components within normal limits  ETHANOL  CBC WITH DIFFERENTIAL/PLATELET  HCG, SERUM, QUALITATIVE    CT HEAD WO CONTRAST ( )  Final Result    DG Chest Port 1 View  Final Result      Medications  nicotine (NICODERM CQ - dosed in mg/24 hr) patch 7 mg (7 mg Transdermal Patch Applied 08/14/23 0822)  acetaminophen (TYLENOL) tablet 650 mg (has no administration in time range)  buprenorphine-naloxone (SUBOXONE) 8-2 mg per SL tablet 1 tablet (1 tablet Sublingual Given 08/14/23 1701)  LORazepam (ATIVAN) tablet 0.5 mg (0.5 mg Oral Given 08/14/23 0859)  ondansetron (ZOFRAN-ODT) disintegrating tablet 4 mg (4 mg Oral Given 08/14/23 1330)  clonazePAM (KLONOPIN) tablet 1 mg (1 mg Oral Given 08/14/23 1329)     Procedures  /  Critical Care Procedures  ED Course and Medical Decision  Making  I have reviewed the triage vital signs, the nursing notes, and pertinent available records from the EMR.  Social Determinants Affecting Complexity of Care: Patient has no clinically significant social determinants affecting this chief complaint..   ED Course:    Medical Decision Making Patient here after her son called the EMS because of patient having hallucinations.  Per EMS report patient has dementia, but I do not see this documented in her medical history.  She does seem anxious and a bit paranoid.  She is washing puzzle pieces in  the sink when I come in the room.  Labs are all reassuring.  Vitals are stable.  I think that patient can be medically cleared for psychiatric evaluation.    On repeat assessment, patient is able to answer all of my questions appropriately.  She states that she still feels very anxious and seems paranoid about her medications.  Will consult TTS for evaluation.  Amount and/or Complexity of Data Reviewed Labs: ordered. Radiology: ordered.         Consultants: TTS consult   Treatment and Plan: Dispo pending psychiatry evaluation.    Final Clinical Impressions(s) / ED Diagnoses     ICD-10-CM   1. Paranoia (HCC)  F22       ED Discharge Orders     None         Discharge Instructions Discussed with and Provided to Patient:     Discharge Instructions       Discharge recommendations:  Patient is to take medications as prescribed. Please see information for follow-up appointment with psychiatry and therapy. Please follow up with your primary care provider for all medical related needs.   Therapy: We recommend that patient participate in individual therapy to address mental health concerns.  Medications: The patient or guardian is to contact a medical professional and/or outpatient provider to address any new side effects that develop. The patient or guardian should update outpatient providers of any new medications and/or medication changes.   Atypical antipsychotics: If you are prescribed an atypical antipsychotic, it is recommended that your height, weight, BMI, blood pressure, fasting lipid panel, and fasting blood sugar be monitored by your outpatient providers.  Safety:  The patient should abstain from use of illicit substances/drugs and abuse of any medications. If symptoms worsen or do not continue to improve or if the patient becomes actively suicidal or homicidal then it is recommended that the patient return to the closest hospital emergency department,  the [facility], or call 911 for further evaluation and treatment. National Suicide Prevention Lifeline 1-800-SUICIDE or 408-147-4087.  About 988 988 offers 24/7 access to trained crisis counselors who can help people experiencing mental health-related distress. People can call or text 988 or chat 988lifeline.org for themselves or if they are worried about a loved one who may need crisis support.  Crisis Mobile: [facility]:                     (351)218-8707 (for crisis response 24 hours a [place]) [facility]:                                            [phone]            Roxy Horseman, Cordelia Poche 08/14/23 2202

## 2023-08-14 NOTE — BH Assessment (Signed)
TTS attempted to evaluate pt but was inform that pt is asleep. TTS will be notified when pt is awake and alert for an assessment.

## 2023-08-14 NOTE — ED Provider Notes (Signed)
Emergency Medicine Observation Re-evaluation Note  Jeanella Flattery Steger is a 48 y.o. female, seen on rounds today.  Pt initially presented to the ED for complaints of No chief complaint on file. Currently, the patient is sleeping.  Physical Exam  BP 132/84 (BP Location: Left Arm)   Pulse 73   Temp 98.2 F (36.8 C) (Oral)   Resp 16   Ht 5\' 7"  (1.702 m)   Wt 107.5 kg   SpO2 92%   BMI 37.12 kg/m  Physical Exam General: sleeping Cardiac: well-perfused Lungs: no resp distress Psych: resting  ED Course / MDM  EKG:   I have reviewed the labs performed to date as well as medications administered while in observation.  Recent changes in the last 24 hours include presented for son calling d/t possible hallucinating, anxiety/paranoia. Labs reassuring. CTH/CXR wnl.   -Added CIWA measurements q6h d/t h/o EtOH withdrawal  Plan  Current plan is for psychiatric  evaluation.    Loetta Rough, MD 08/14/23 [phone]

## 2023-08-14 NOTE — ED Notes (Signed)
Pt was asleep when I took her breakfast in. I informed her of it and she nodded and went back to sleep.

## 2023-08-14 NOTE — ED Notes (Signed)
The patient refused lunch stating she is not hungry.

## 2023-08-14 NOTE — ED Notes (Signed)
I asked pt for a urine. She went to the bathroom and she said she could not urinate. She also wanted to know why she was here and what is next. She wants her door closed because she doesn't want to be triggered by the other residents screaming. Patient is cooperative and calm. She said to let her sleep even if breakfast comes in.
